# Patient Record
Sex: Male | Born: 1935 | Race: Black or African American | Hispanic: No | Smoking: Former smoker
Health system: Southern US, Community
[De-identification: ages and names within clinical notes are randomized; demographics above are authoritative.]

## PROBLEM LIST (undated history)

## (undated) DIAGNOSIS — Z9289 Personal history of other medical treatment: Secondary | ICD-10-CM

## (undated) DIAGNOSIS — I4891 Unspecified atrial fibrillation: Secondary | ICD-10-CM

## (undated) DIAGNOSIS — I739 Peripheral vascular disease, unspecified: Secondary | ICD-10-CM

## (undated) DIAGNOSIS — D649 Anemia, unspecified: Secondary | ICD-10-CM

## (undated) DIAGNOSIS — J189 Pneumonia, unspecified organism: Secondary | ICD-10-CM

## (undated) DIAGNOSIS — I10 Essential (primary) hypertension: Secondary | ICD-10-CM

## (undated) DIAGNOSIS — K5790 Diverticulosis of intestine, part unspecified, without perforation or abscess without bleeding: Secondary | ICD-10-CM

## (undated) DIAGNOSIS — E079 Disorder of thyroid, unspecified: Secondary | ICD-10-CM

## (undated) DIAGNOSIS — I219 Acute myocardial infarction, unspecified: Secondary | ICD-10-CM

## (undated) DIAGNOSIS — I350 Nonrheumatic aortic (valve) stenosis: Secondary | ICD-10-CM

## (undated) DIAGNOSIS — N189 Chronic kidney disease, unspecified: Secondary | ICD-10-CM

## (undated) DIAGNOSIS — C801 Malignant (primary) neoplasm, unspecified: Secondary | ICD-10-CM

## (undated) DIAGNOSIS — R0602 Shortness of breath: Secondary | ICD-10-CM

## (undated) DIAGNOSIS — I2510 Atherosclerotic heart disease of native coronary artery without angina pectoris: Secondary | ICD-10-CM

## (undated) DIAGNOSIS — K635 Polyp of colon: Secondary | ICD-10-CM

## (undated) DIAGNOSIS — E785 Hyperlipidemia, unspecified: Secondary | ICD-10-CM

## (undated) DIAGNOSIS — E46 Unspecified protein-calorie malnutrition: Secondary | ICD-10-CM

## (undated) DIAGNOSIS — K219 Gastro-esophageal reflux disease without esophagitis: Secondary | ICD-10-CM

## (undated) DIAGNOSIS — K648 Other hemorrhoids: Secondary | ICD-10-CM

## (undated) HISTORY — DX: Anemia, unspecified: D64.9

## (undated) HISTORY — DX: Chronic kidney disease, unspecified: N18.9

## (undated) HISTORY — DX: Disorder of thyroid, unspecified: E07.9

## (undated) HISTORY — DX: Hyperlipidemia, unspecified: E78.5

## (undated) HISTORY — DX: Malignant (primary) neoplasm, unspecified: C80.1

## (undated) HISTORY — PX: TOE AMPUTATION: SHX809

## (undated) HISTORY — DX: Other hemorrhoids: K64.8

## (undated) HISTORY — DX: Peripheral vascular disease, unspecified: I73.9

## (undated) HISTORY — DX: Unspecified protein-calorie malnutrition: E46

## (undated) HISTORY — DX: Essential (primary) hypertension: I10

## (undated) HISTORY — PX: CARDIAC CATHETERIZATION: SHX172

## (undated) HISTORY — PX: AMPUTATION: SHX166

## (undated) HISTORY — DX: Diverticulosis of intestine, part unspecified, without perforation or abscess without bleeding: K57.90

## (undated) HISTORY — DX: Unspecified atrial fibrillation: I48.91

## (undated) HISTORY — DX: Polyp of colon: K63.5

## (undated) HISTORY — PX: INCISE AND DRAIN ABCESS: PRO64

---

## 2004-04-28 ENCOUNTER — Encounter: Admission: RE | Admit: 2004-04-28 | Discharge: 2004-04-28 | Payer: Self-pay | Admitting: Cardiovascular Disease

## 2004-09-16 ENCOUNTER — Ambulatory Visit: Admission: RE | Admit: 2004-09-16 | Discharge: 2004-09-19 | Payer: Self-pay | Admitting: Radiation Oncology

## 2005-01-20 ENCOUNTER — Ambulatory Visit: Admission: RE | Admit: 2005-01-20 | Discharge: 2005-02-25 | Payer: Self-pay | Admitting: Radiation Oncology

## 2005-03-27 ENCOUNTER — Ambulatory Visit (HOSPITAL_COMMUNITY): Admission: RE | Admit: 2005-03-27 | Discharge: 2005-03-27 | Payer: Self-pay | Admitting: Urology

## 2006-05-14 ENCOUNTER — Ambulatory Visit (HOSPITAL_COMMUNITY): Admission: RE | Admit: 2006-05-14 | Discharge: 2006-05-14 | Payer: Self-pay | Admitting: Urology

## 2006-05-21 ENCOUNTER — Encounter (HOSPITAL_COMMUNITY): Admission: RE | Admit: 2006-05-21 | Discharge: 2006-07-06 | Payer: Self-pay | Admitting: Urology

## 2006-06-12 HISTORY — PX: AV FISTULA PLACEMENT: SHX1204

## 2006-07-08 ENCOUNTER — Ambulatory Visit (HOSPITAL_COMMUNITY): Admission: RE | Admit: 2006-07-08 | Discharge: 2006-07-08 | Payer: Self-pay | Admitting: Vascular Surgery

## 2006-11-15 ENCOUNTER — Ambulatory Visit (HOSPITAL_COMMUNITY): Admission: RE | Admit: 2006-11-15 | Discharge: 2006-11-15 | Payer: Self-pay | Admitting: Urology

## 2007-03-09 ENCOUNTER — Encounter (HOSPITAL_COMMUNITY): Admission: RE | Admit: 2007-03-09 | Discharge: 2007-06-07 | Payer: Self-pay | Admitting: Nephrology

## 2007-06-15 ENCOUNTER — Encounter (HOSPITAL_COMMUNITY): Admission: RE | Admit: 2007-06-15 | Discharge: 2007-09-12 | Payer: Self-pay | Admitting: Nephrology

## 2007-11-09 ENCOUNTER — Encounter (HOSPITAL_COMMUNITY): Admission: RE | Admit: 2007-11-09 | Discharge: 2008-02-07 | Payer: Self-pay | Admitting: Nephrology

## 2008-02-08 ENCOUNTER — Encounter (HOSPITAL_COMMUNITY): Admission: RE | Admit: 2008-02-08 | Discharge: 2008-05-08 | Payer: Self-pay | Admitting: Nephrology

## 2008-04-25 ENCOUNTER — Encounter: Admission: RE | Admit: 2008-04-25 | Discharge: 2008-04-25 | Payer: Self-pay | Admitting: Cardiovascular Disease

## 2008-05-31 ENCOUNTER — Encounter (HOSPITAL_COMMUNITY): Admission: RE | Admit: 2008-05-31 | Discharge: 2008-08-29 | Payer: Self-pay | Admitting: Nephrology

## 2008-08-30 ENCOUNTER — Encounter (HOSPITAL_COMMUNITY): Admission: RE | Admit: 2008-08-30 | Discharge: 2008-10-11 | Payer: Self-pay | Admitting: Nephrology

## 2008-10-24 ENCOUNTER — Encounter (HOSPITAL_COMMUNITY): Admission: RE | Admit: 2008-10-24 | Discharge: 2009-01-22 | Payer: Self-pay | Admitting: Nephrology

## 2009-02-06 ENCOUNTER — Encounter (HOSPITAL_COMMUNITY): Admission: RE | Admit: 2009-02-06 | Discharge: 2009-05-07 | Payer: Self-pay | Admitting: Nephrology

## 2009-05-15 ENCOUNTER — Encounter (HOSPITAL_COMMUNITY): Admission: RE | Admit: 2009-05-15 | Discharge: 2009-08-13 | Payer: Self-pay | Admitting: Nephrology

## 2009-07-27 ENCOUNTER — Emergency Department (HOSPITAL_COMMUNITY): Admission: EM | Admit: 2009-07-27 | Discharge: 2009-07-27 | Payer: Self-pay | Admitting: Emergency Medicine

## 2009-08-02 ENCOUNTER — Emergency Department (HOSPITAL_COMMUNITY): Admission: EM | Admit: 2009-08-02 | Discharge: 2009-08-02 | Payer: Self-pay | Admitting: Emergency Medicine

## 2009-10-18 ENCOUNTER — Observation Stay (HOSPITAL_COMMUNITY): Admission: AD | Admit: 2009-10-18 | Discharge: 2009-10-20 | Payer: Self-pay | Admitting: Nephrology

## 2009-10-18 ENCOUNTER — Ambulatory Visit: Payer: Self-pay | Admitting: Internal Medicine

## 2009-10-19 ENCOUNTER — Encounter: Payer: Self-pay | Admitting: Internal Medicine

## 2009-10-19 ENCOUNTER — Encounter (INDEPENDENT_AMBULATORY_CARE_PROVIDER_SITE_OTHER): Payer: Self-pay | Admitting: Nephrology

## 2009-10-25 ENCOUNTER — Encounter (HOSPITAL_COMMUNITY): Admission: RE | Admit: 2009-10-25 | Discharge: 2010-01-20 | Payer: Self-pay | Admitting: Nephrology

## 2009-10-29 ENCOUNTER — Encounter: Payer: Self-pay | Admitting: Internal Medicine

## 2009-11-30 ENCOUNTER — Emergency Department (HOSPITAL_COMMUNITY): Admission: EM | Admit: 2009-11-30 | Discharge: 2009-11-30 | Payer: Self-pay | Admitting: Emergency Medicine

## 2009-12-07 ENCOUNTER — Inpatient Hospital Stay (HOSPITAL_COMMUNITY): Admission: EM | Admit: 2009-12-07 | Discharge: 2009-12-21 | Payer: Self-pay | Admitting: Emergency Medicine

## 2009-12-10 ENCOUNTER — Encounter (INDEPENDENT_AMBULATORY_CARE_PROVIDER_SITE_OTHER): Payer: Self-pay | Admitting: Cardiology

## 2009-12-18 ENCOUNTER — Ambulatory Visit: Payer: Self-pay | Admitting: Physical Medicine & Rehabilitation

## 2009-12-21 ENCOUNTER — Ambulatory Visit: Payer: Self-pay | Admitting: Physical Medicine & Rehabilitation

## 2009-12-21 ENCOUNTER — Inpatient Hospital Stay (HOSPITAL_COMMUNITY)
Admission: EM | Admit: 2009-12-21 | Discharge: 2009-12-28 | Payer: Self-pay | Admitting: Physical Medicine & Rehabilitation

## 2009-12-24 ENCOUNTER — Ambulatory Visit: Payer: Self-pay | Admitting: Internal Medicine

## 2010-01-26 ENCOUNTER — Emergency Department (HOSPITAL_COMMUNITY): Admission: EM | Admit: 2010-01-26 | Discharge: 2010-01-26 | Payer: Self-pay | Admitting: Emergency Medicine

## 2010-02-19 ENCOUNTER — Emergency Department (HOSPITAL_COMMUNITY): Admission: EM | Admit: 2010-02-19 | Discharge: 2010-02-19 | Payer: Self-pay | Admitting: Emergency Medicine

## 2010-03-05 ENCOUNTER — Ambulatory Visit (HOSPITAL_BASED_OUTPATIENT_CLINIC_OR_DEPARTMENT_OTHER): Admission: RE | Admit: 2010-03-05 | Discharge: 2010-03-05 | Payer: Self-pay | Admitting: Orthopedic Surgery

## 2010-03-12 ENCOUNTER — Ambulatory Visit (HOSPITAL_BASED_OUTPATIENT_CLINIC_OR_DEPARTMENT_OTHER): Admission: RE | Admit: 2010-03-12 | Discharge: 2010-03-12 | Payer: Self-pay | Admitting: Orthopedic Surgery

## 2010-05-28 ENCOUNTER — Ambulatory Visit: Payer: Self-pay | Admitting: Vascular Surgery

## 2010-05-30 ENCOUNTER — Ambulatory Visit: Payer: Self-pay | Admitting: Surgery

## 2010-05-30 ENCOUNTER — Ambulatory Visit (HOSPITAL_COMMUNITY): Admission: RE | Admit: 2010-05-30 | Discharge: 2010-05-30 | Payer: Self-pay | Admitting: Vascular Surgery

## 2010-05-30 HISTORY — PX: INSERTION OF DIALYSIS CATHETER: SHX1324

## 2010-06-30 ENCOUNTER — Ambulatory Visit: Payer: Self-pay | Admitting: Surgery

## 2010-08-11 ENCOUNTER — Ambulatory Visit: Payer: Self-pay | Admitting: Surgery

## 2010-08-13 ENCOUNTER — Ambulatory Visit (HOSPITAL_COMMUNITY): Admission: RE | Admit: 2010-08-13 | Discharge: 2010-08-13 | Payer: Self-pay | Admitting: Surgery

## 2010-08-13 ENCOUNTER — Ambulatory Visit: Payer: Self-pay | Admitting: Surgery

## 2010-09-01 ENCOUNTER — Ambulatory Visit: Payer: Self-pay | Admitting: Surgery

## 2010-09-30 ENCOUNTER — Ambulatory Visit (HOSPITAL_COMMUNITY)
Admission: RE | Admit: 2010-09-30 | Discharge: 2010-09-30 | Payer: Self-pay | Source: Home / Self Care | Attending: Nephrology | Admitting: Nephrology

## 2010-11-02 ENCOUNTER — Encounter: Payer: Self-pay | Admitting: Urology

## 2010-11-11 NOTE — Letter (Signed)
Summary: Patient Notice-Hyperplastic Polyps  [facility]  [address] [place], [place] [address]   Phone: [phone]  Fax: [phone]        October 29, 2009 MRN: [id number]    Brad Mcintosh [address] [place], [place]  [address]    Dear [name],  I am pleased to inform you that the polyps removed during your recent colonoscopyand upper endoscopy were found to be hyperplastic (colon) and inflammatory (stomach).  These types of polyps are NOT pre-cancerous.  No further follow-up is needed for these polyps.  Should you develop new or worsening symptoms of abdominal pain, bowel habit changes or bleeding from the rectum or bowels, please schedule an evaluation with either your primary care physician or with me.  No further action with gastroenterology is needed at this time.      Please follow-up with your primary care physician for your other      healthcare needs.  Please call us if you are having persistent problems or have questions about your condition that have not been fully answered at this time.   Sincerely,  Iva Boop MD, [facility] This letter has been electronically signed by your physician.  cc: Annie Sable, MD  Appended Document: Patient Notice-Hyperplastic Polyps letter mailed to patient's home

## 2010-11-11 NOTE — Procedures (Signed)
Summary: EGD   EGD  Procedure date:  10/19/2009  Findings:      Location: [facility]   ENDOSCOPY PROCEDURE REPORT  PATIENT:  Brad Mcintosh, Brad Mcintosh  MR#:  [id number] BIRTHDATE:   17-Jan-1935, 74 yrs. old   GENDER:   male  ENDOSCOPIST:   Iva Boop, MD, [facility] Referred by: Annie Sable, M.D.  PROCEDURE DATE:  10/19/2009 PROCEDURE:  EGD with snare polypectomy, EGD with Submucosal Injection ASA CLASS:   Class III INDICATIONS: FOBT + stool, anemia   MEDICATIONS:    There was residual sedation effect present from prior procedure., Versed 1 mg TOPICAL ANESTHETIC:   Cetacaine Spray  DESCRIPTION OF PROCEDURE:   After the risks benefits and alternatives of the procedure were thoroughly explained, informed consent was obtained.  The EG-2990i ([id number]) endoscope was introduced through the mouth and advanced to the second portion of the duodenum, without limitations.  The instrument was slowly withdrawn as the mucosa was fully examined. <<PROCEDUREIMAGES>>              <<OLD IMAGES>>  A sessile polyp was found in the body of the stomach. It was erosive and bleeding. It was 8 - 10 mm in size. epinephrine injection Polyp was snared, then cauterized with monopolar cautery. Polyp was retrieved and sent to pathology. Endoscopic clip was placed 3 cc EPI injected prio to polypectomy. Prophylactic clip after polypectomy.  Nodular mucosa was found in the body of the stomach. Eroded nodules scattered about. Xanthomas also.  Otherwise the examination was normal.    Retroflexed views revealed no abnormalities.    The scope was then withdrawn from the patient and the procedure completed.  COMPLICATIONS:   None  ENDOSCOPIC IMPRESSION:  1) 8 - 10 mm sessile polyp in the body of the stomach, with heme and erosion present (snared off)  2) Nodular mucosa in the body of the stomach, with erosions  3) Otherwise normal examination RECOMMENDATIONS:  bid PPI in hopsital then qd at dc  I will  follow-up pathology and notify  please call us back if needed  suspect anemia is multifactorial and oozing from polyp/gastrits has contributed  REPEAT EXAM:   await pathology to determine    Iva Boop, MD, Brad Mcintosh    CC: Annie Sable, MD

## 2010-11-11 NOTE — Procedures (Signed)
Summary: Colonoscopy   Colonoscopy  Procedure date:  10/19/2009  Findings:      Location:  [facility].   COLONOSCOPY PROCEDURE REPORT  PATIENT:  Brad, Mcintosh  MR#:  [id number] BIRTHDATE:   April 23, 1935, 74 yrs. old   GENDER:   male  ENDOSCOPIST:   Iva Boop, MD, [facility] Referred by: Annie Sable, M.D.  PROCEDURE DATE:  10/19/2009 PROCEDURE:  Colonoscopy with snare polypectomy ASA CLASS:   Class III INDICATIONS: FOBT positive stool, anemia   MEDICATIONS:    Fentanyl 50 mcg IV, Versed 7 mg IV  DESCRIPTION OF PROCEDURE:   After the risks benefits and alternatives of the procedure were thoroughly explained, informed consent was obtained.  Digital rectal exam was performed and revealed no abnormalities and normal prostate.   The EC-3890Li ([id number]) and EG-2990i ([id number]) endoscope was introduced through the anus and advanced to the cecum, which was identified by both the appendix and ileocecal valve, without limitations.  The quality of the prep was good, using Colyte.  The instrument was then slowly withdrawn as the colon was fully examined over 11 mins 35 seconds. <<PROCEDUREIMAGES>>            <<OLD IMAGES>>  FINDINGS:  Two polyps were found. Two diminutive polyps. Siza about 3 mm. Polyps were snared without cautery. Retrieval was successful.   Moderate diverticulosis was found in the sigmoid colon.  This was otherwise a normal examination of the colon.   Retroflexed views in the rectum revealed internal hemorrhoids.    The scope was then withdrawn from the patient and the procedure completed.  COMPLICATIONS:   None  ENDOSCOPIC IMPRESSION:  1) Two 3mm  polyps removed  2) Moderate diverticulosis in the sigmoid colon  3) Internal hemorrhoids  4) Otherwise normal examination, good prep RECOMMENDATIONS:  EGD next  REPEAT EXAM:   In for Colonoscopy, pending biopsy results.    Iva Boop, MD, [facility]  CC: Annie Sable, MD

## 2010-12-23 LAB — POCT I-STAT, CHEM 8
BUN: 46 mg/dL — ABNORMAL HIGH (ref 6–23)
Calcium, Ion: 1.08 mmol/L — ABNORMAL LOW (ref 1.12–1.32)
Chloride: 105 mEq/L (ref 96–112)
Creatinine, Ser: 8.2 mg/dL — ABNORMAL HIGH (ref 0.4–1.5)
Hemoglobin: 11.6 g/dL — ABNORMAL LOW (ref 13.0–17.0)
Potassium: 4.8 mEq/L (ref 3.5–5.1)
TCO2: 27 mmol/L (ref 0–100)

## 2010-12-26 LAB — POCT I-STAT 4, (NA,K, GLUC, HGB,HCT)
HCT: 39 % (ref 39.0–52.0)
Sodium: 140 mEq/L (ref 135–145)

## 2010-12-28 LAB — CROSSMATCH: Antibody Screen: NEGATIVE

## 2010-12-28 LAB — CBC
HCT: 16.9 % — ABNORMAL LOW (ref 39.0–52.0)
HCT: 24.4 % — ABNORMAL LOW (ref 39.0–52.0)
Hemoglobin: 5.7 g/dL — CL (ref 13.0–17.0)
Hemoglobin: 7.3 g/dL — ABNORMAL LOW (ref 13.0–17.0)
Hemoglobin: 8.3 g/dL — ABNORMAL LOW (ref 13.0–17.0)
MCHC: 33.8 g/dL (ref 30.0–36.0)
MCV: 87.6 fL (ref 78.0–100.0)
RBC: 2.47 MIL/uL — ABNORMAL LOW (ref 4.22–5.81)
RBC: 2.77 MIL/uL — ABNORMAL LOW (ref 4.22–5.81)
RDW: 16.5 % — ABNORMAL HIGH (ref 11.5–15.5)
WBC: 4.7 10*3/uL (ref 4.0–10.5)

## 2010-12-28 LAB — COMPREHENSIVE METABOLIC PANEL
Alkaline Phosphatase: 24 U/L — ABNORMAL LOW (ref 39–117)
BUN: 89 mg/dL — ABNORMAL HIGH (ref 6–23)
Chloride: 115 mEq/L — ABNORMAL HIGH (ref 96–112)
Glucose, Bld: 115 mg/dL — ABNORMAL HIGH (ref 70–99)
Potassium: 5.3 mEq/L — ABNORMAL HIGH (ref 3.5–5.1)
Total Bilirubin: 0.7 mg/dL (ref 0.3–1.2)

## 2010-12-28 LAB — RENAL FUNCTION PANEL
BUN: 78 mg/dL — ABNORMAL HIGH (ref 6–23)
BUN: 88 mg/dL — ABNORMAL HIGH (ref 6–23)
CO2: 12 mEq/L — ABNORMAL LOW (ref 19–32)
CO2: 14 mEq/L — ABNORMAL LOW (ref 19–32)
CO2: 15 mEq/L — ABNORMAL LOW (ref 19–32)
Calcium: 8.1 mg/dL — ABNORMAL LOW (ref 8.4–10.5)
Calcium: 8.5 mg/dL (ref 8.4–10.5)
Chloride: 113 mEq/L — ABNORMAL HIGH (ref 96–112)
Creatinine, Ser: 9.37 mg/dL — ABNORMAL HIGH (ref 0.4–1.5)
Glucose, Bld: 104 mg/dL — ABNORMAL HIGH (ref 70–99)
Glucose, Bld: 115 mg/dL — ABNORMAL HIGH (ref 70–99)
Glucose, Bld: 141 mg/dL — ABNORMAL HIGH (ref 70–99)
Phosphorus: 5.4 mg/dL — ABNORMAL HIGH (ref 2.3–4.6)
Potassium: 4.1 mEq/L (ref 3.5–5.1)
Sodium: 138 mEq/L (ref 135–145)

## 2010-12-28 LAB — IRON AND TIBC
Iron: 33 ug/dL — ABNORMAL LOW (ref 42–135)
Iron: 55 ug/dL (ref 42–135)
Saturation Ratios: 19 % — ABNORMAL LOW (ref 20–55)
TIBC: 178 ug/dL — ABNORMAL LOW (ref 215–435)
TIBC: 181 ug/dL — ABNORMAL LOW (ref 215–435)
UIBC: 145 ug/dL

## 2010-12-28 LAB — ABO/RH: ABO/RH(D): O POS

## 2010-12-28 LAB — FERRITIN: Ferritin: 976 ng/mL — ABNORMAL HIGH (ref 22–322)

## 2010-12-28 LAB — PREPARE RBC (CROSSMATCH)

## 2010-12-29 LAB — WOUND CULTURE

## 2010-12-29 LAB — FUNGUS CULTURE W SMEAR: Fungal Smear: NONE SEEN

## 2010-12-29 LAB — BASIC METABOLIC PANEL
BUN: 94 mg/dL — ABNORMAL HIGH (ref 6–23)
CO2: 24 mEq/L (ref 19–32)
Calcium: 9.5 mg/dL (ref 8.4–10.5)
Chloride: 96 mEq/L (ref 96–112)
Creatinine, Ser: 11.95 mg/dL — ABNORMAL HIGH (ref 0.4–1.5)
GFR calc Af Amer: 5 mL/min — ABNORMAL LOW (ref >60)
GFR calc non Af Amer: 4 mL/min — ABNORMAL LOW (ref >60)
Glucose, Bld: 98 mg/dL (ref 70–99)
Potassium: 4.2 mEq/L (ref 3.5–5.1)
Sodium: 138 mEq/L (ref 135–145)

## 2010-12-29 LAB — ANAEROBIC CULTURE

## 2010-12-29 LAB — POCT I-STAT, CHEM 8
BUN: 35 mg/dL — ABNORMAL HIGH (ref 6–23)
Calcium, Ion: 1.09 mmol/L — ABNORMAL LOW (ref 1.12–1.32)
Chloride: 100 mEq/L (ref 96–112)
Creatinine, Ser: 8.8 mg/dL — ABNORMAL HIGH (ref 0.4–1.5)
Glucose, Bld: 87 mg/dL (ref 70–99)
HCT: 33 % — ABNORMAL LOW (ref 39.0–52.0)
Hemoglobin: 11.2 g/dL — ABNORMAL LOW (ref 13.0–17.0)
Potassium: 4.2 mEq/L (ref 3.5–5.1)
Sodium: 135 mEq/L (ref 135–145)
TCO2: 28 mmol/L (ref 0–100)

## 2010-12-29 LAB — POCT HEMOGLOBIN-HEMACUE: Hemoglobin: 10.7 g/dL — ABNORMAL LOW (ref 13.0–17.0)

## 2010-12-29 LAB — AFB CULTURE WITH SMEAR (NOT AT ARMC): Acid Fast Smear: NONE SEEN

## 2010-12-30 LAB — WOUND CULTURE

## 2010-12-30 LAB — DIFFERENTIAL
Eosinophils Absolute: 0 10*3/uL (ref 0.0–0.7)
Lymphs Abs: 1.3 10*3/uL (ref 0.7–4.0)
Monocytes Absolute: 1.1 10*3/uL — ABNORMAL HIGH (ref 0.1–1.0)
Monocytes Relative: 11 % (ref 3–12)
Neutro Abs: 7.1 10*3/uL (ref 1.7–7.7)
Neutrophils Relative %: 74 % (ref 43–77)

## 2010-12-30 LAB — BASIC METABOLIC PANEL
CO2: 26 mEq/L (ref 19–32)
Chloride: 99 mEq/L (ref 96–112)
Creatinine, Ser: 7.33 mg/dL — ABNORMAL HIGH (ref 0.4–1.5)
GFR calc Af Amer: 9 mL/min — ABNORMAL LOW (ref >60)
Sodium: 137 mEq/L (ref 135–145)

## 2010-12-30 LAB — CBC
Hemoglobin: 11.1 g/dL — ABNORMAL LOW (ref 13.0–17.0)
MCV: 90.2 fL (ref 78.0–100.0)
RBC: 3.67 MIL/uL — ABNORMAL LOW (ref 4.22–5.81)
WBC: 9.6 10*3/uL (ref 4.0–10.5)

## 2010-12-31 LAB — RENAL FUNCTION PANEL
BUN: 142 mg/dL — ABNORMAL HIGH (ref 6–23)
BUN: 145 mg/dL — ABNORMAL HIGH (ref 6–23)
CO2: 16 mEq/L — ABNORMAL LOW (ref 19–32)
CO2: 18 mEq/L — ABNORMAL LOW (ref 19–32)
Chloride: 111 mEq/L (ref 96–112)
Creatinine, Ser: 16.1 mg/dL — ABNORMAL HIGH (ref 0.4–1.5)
Glucose, Bld: 107 mg/dL — ABNORMAL HIGH (ref 70–99)
Potassium: 3.4 mEq/L — ABNORMAL LOW (ref 3.5–5.1)
Sodium: 145 mEq/L (ref 135–145)

## 2010-12-31 LAB — URINALYSIS, ROUTINE W REFLEX MICROSCOPIC
Glucose, UA: NEGATIVE mg/dL
Ketones, ur: NEGATIVE mg/dL
Protein, ur: 100 mg/dL — AB
Urobilinogen, UA: 0.2 mg/dL (ref 0.0–1.0)

## 2010-12-31 LAB — BLOOD GAS, ARTERIAL
Drawn by: 32463
O2 Content: 3 L/min
pCO2 arterial: 22.7 mmHg — ABNORMAL LOW (ref 35.0–45.0)
pO2, Arterial: 131 mmHg — ABNORMAL HIGH (ref 80.0–100.0)

## 2010-12-31 LAB — CBC
HCT: 25.2 % — ABNORMAL LOW (ref 39.0–52.0)
HCT: 27 % — ABNORMAL LOW (ref 39.0–52.0)
HCT: 31.3 % — ABNORMAL LOW (ref 39.0–52.0)
Hemoglobin: 10.4 g/dL — ABNORMAL LOW (ref 13.0–17.0)
Hemoglobin: 8.5 g/dL — ABNORMAL LOW (ref 13.0–17.0)
Hemoglobin: 9.2 g/dL — ABNORMAL LOW (ref 13.0–17.0)
MCHC: 33.1 g/dL (ref 30.0–36.0)
MCHC: 33.9 g/dL (ref 30.0–36.0)
MCV: 89.6 fL (ref 78.0–100.0)
Platelets: 303 10*3/uL (ref 150–400)
RBC: 2.85 MIL/uL — ABNORMAL LOW (ref 4.22–5.81)
RBC: 3.05 MIL/uL — ABNORMAL LOW (ref 4.22–5.81)
RBC: 3.16 MIL/uL — ABNORMAL LOW (ref 4.22–5.81)
RBC: 3.49 MIL/uL — ABNORMAL LOW (ref 4.22–5.81)
RDW: 16.1 % — ABNORMAL HIGH (ref 11.5–15.5)
WBC: 19.3 10*3/uL — ABNORMAL HIGH (ref 4.0–10.5)
WBC: 21.4 10*3/uL — ABNORMAL HIGH (ref 4.0–10.5)

## 2010-12-31 LAB — POCT I-STAT 3, ART BLOOD GAS (G3+)
Acid-base deficit: 21 mmol/L — ABNORMAL HIGH (ref 0.0–2.0)
Bicarbonate: 5.5 mEq/L — ABNORMAL LOW (ref 20.0–24.0)
pO2, Arterial: 49 mmHg — ABNORMAL LOW (ref 80.0–100.0)

## 2010-12-31 LAB — URINE MICROSCOPIC-ADD ON

## 2010-12-31 LAB — DIFFERENTIAL
Basophils Absolute: 0.2 10*3/uL — ABNORMAL HIGH (ref 0.0–0.1)
Eosinophils Absolute: 0 10*3/uL (ref 0.0–0.7)
Lymphs Abs: 0.6 10*3/uL — ABNORMAL LOW (ref 0.7–4.0)
Monocytes Absolute: 0.8 10*3/uL (ref 0.1–1.0)
Neutro Abs: 17.7 10*3/uL — ABNORMAL HIGH (ref 1.7–7.7)

## 2010-12-31 LAB — COMPREHENSIVE METABOLIC PANEL
ALT: 10 U/L (ref 0–53)
ALT: 11 U/L (ref 0–53)
ALT: 9 U/L (ref 0–53)
AST: 14 U/L (ref 0–37)
AST: 28 U/L (ref 0–37)
Albumin: 3 g/dL — ABNORMAL LOW (ref 3.5–5.2)
Albumin: 3.5 g/dL (ref 3.5–5.2)
Alkaline Phosphatase: 47 U/L (ref 39–117)
Alkaline Phosphatase: 55 U/L (ref 39–117)
Alkaline Phosphatase: 56 U/L (ref 39–117)
CO2: 19 mEq/L (ref 19–32)
Calcium: 8.1 mg/dL — ABNORMAL LOW (ref 8.4–10.5)
Chloride: 100 mEq/L (ref 96–112)
Chloride: 102 mEq/L (ref 96–112)
Chloride: 113 mEq/L — ABNORMAL HIGH (ref 96–112)
GFR calc Af Amer: 2 mL/min — ABNORMAL LOW (ref >60)
GFR calc Af Amer: 5 mL/min — ABNORMAL LOW (ref >60)
GFR calc non Af Amer: 4 mL/min — ABNORMAL LOW (ref >60)
Glucose, Bld: 106 mg/dL — ABNORMAL HIGH (ref 70–99)
Glucose, Bld: 99 mg/dL (ref 70–99)
Potassium: 3.2 mEq/L — ABNORMAL LOW (ref 3.5–5.1)
Potassium: 3.5 mEq/L (ref 3.5–5.1)
Potassium: 5.8 mEq/L — ABNORMAL HIGH (ref 3.5–5.1)
Sodium: 141 mEq/L (ref 135–145)
Sodium: 142 mEq/L (ref 135–145)
Total Bilirubin: 0.9 mg/dL (ref 0.3–1.2)
Total Protein: 7.2 g/dL (ref 6.0–8.3)

## 2010-12-31 LAB — CK TOTAL AND CKMB (NOT AT ARMC)
CK, MB: 4.9 ng/mL — ABNORMAL HIGH (ref 0.3–4.0)
Relative Index: 1.2 (ref 0.0–2.5)
Relative Index: 1.6 (ref 0.0–2.5)
Total CK: 193 U/L (ref 7–232)
Total CK: 207 U/L (ref 7–232)

## 2010-12-31 LAB — CULTURE, BLOOD (ROUTINE X 2): Culture: NO GROWTH

## 2010-12-31 LAB — HEPATITIS B SURFACE ANTIBODY,QUALITATIVE: Hep B S Ab: NEGATIVE

## 2010-12-31 LAB — LIPID PANEL
HDL: 32 mg/dL — ABNORMAL LOW (ref >39)
Total CHOL/HDL Ratio: 3.5 RATIO
VLDL: 16 mg/dL (ref 0–40)

## 2010-12-31 LAB — POCT CARDIAC MARKERS
CKMB, poc: 10.9 ng/mL (ref 1.0–8.0)
Myoglobin, poc: >500 ng/mL (ref 12–200)

## 2010-12-31 LAB — HEPATITIS B SURFACE ANTIGEN: Hepatitis B Surface Ag: NEGATIVE

## 2010-12-31 LAB — IRON AND TIBC

## 2010-12-31 LAB — TYPE AND SCREEN: Antibody Screen: NEGATIVE

## 2010-12-31 LAB — LACTIC ACID, PLASMA: Lactic Acid, Venous: 1.5 mmol/L (ref 0.5–2.2)

## 2011-01-04 LAB — BLOOD GAS, ARTERIAL
Acid-Base Excess: 6.9 mmol/L — ABNORMAL HIGH (ref 0.0–2.0)
Bicarbonate: 29.3 mEq/L — ABNORMAL HIGH (ref 20.0–24.0)
O2 Saturation: 99.1 %
Patient temperature: 98.6
TCO2: 30.2 mmol/L (ref 0–100)
pO2, Arterial: 115 mmHg — ABNORMAL HIGH (ref 80.0–100.0)

## 2011-01-04 LAB — CROSSMATCH
ABO/RH(D): O POS
Antibody Screen: NEGATIVE

## 2011-01-04 LAB — RENAL FUNCTION PANEL
Albumin: 2.5 g/dL — ABNORMAL LOW (ref 3.5–5.2)
BUN: 53 mg/dL — ABNORMAL HIGH (ref 6–23)
BUN: 54 mg/dL — ABNORMAL HIGH (ref 6–23)
BUN: 77 mg/dL — ABNORMAL HIGH (ref 6–23)
BUN: 84 mg/dL — ABNORMAL HIGH (ref 6–23)
BUN: 87 mg/dL — ABNORMAL HIGH (ref 6–23)
CO2: 17 mEq/L — ABNORMAL LOW (ref 19–32)
CO2: 19 mEq/L (ref 19–32)
CO2: 20 mEq/L (ref 19–32)
CO2: 24 mEq/L (ref 19–32)
CO2: 24 mEq/L (ref 19–32)
CO2: 25 mEq/L (ref 19–32)
Calcium: 8.3 mg/dL — ABNORMAL LOW (ref 8.4–10.5)
Calcium: 8.5 mg/dL (ref 8.4–10.5)
Calcium: 8.5 mg/dL (ref 8.4–10.5)
Calcium: 9.1 mg/dL (ref 8.4–10.5)
Chloride: 100 mEq/L (ref 96–112)
Chloride: 97 mEq/L (ref 96–112)
Chloride: 97 mEq/L (ref 96–112)
Chloride: 98 mEq/L (ref 96–112)
Creatinine, Ser: 10.83 mg/dL — ABNORMAL HIGH (ref 0.4–1.5)
Creatinine, Ser: 10.99 mg/dL — ABNORMAL HIGH (ref 0.4–1.5)
Creatinine, Ser: 12.02 mg/dL — ABNORMAL HIGH (ref 0.4–1.5)
Creatinine, Ser: 7.85 mg/dL — ABNORMAL HIGH (ref 0.4–1.5)
Creatinine, Ser: 8.51 mg/dL — ABNORMAL HIGH (ref 0.4–1.5)
GFR calc Af Amer: 6 mL/min — ABNORMAL LOW (ref >60)
GFR calc non Af Amer: 4 mL/min — ABNORMAL LOW (ref >60)
Glucose, Bld: 103 mg/dL — ABNORMAL HIGH (ref 70–99)
Glucose, Bld: 104 mg/dL — ABNORMAL HIGH (ref 70–99)
Glucose, Bld: 153 mg/dL — ABNORMAL HIGH (ref 70–99)
Glucose, Bld: 160 mg/dL — ABNORMAL HIGH (ref 70–99)
Glucose, Bld: 167 mg/dL — ABNORMAL HIGH (ref 70–99)
Glucose, Bld: 99 mg/dL (ref 70–99)
Phosphorus: 3.4 mg/dL (ref 2.3–4.6)
Phosphorus: 3.9 mg/dL (ref 2.3–4.6)
Phosphorus: 4.9 mg/dL — ABNORMAL HIGH (ref 2.3–4.6)
Phosphorus: 5.1 mg/dL — ABNORMAL HIGH (ref 2.3–4.6)
Potassium: 3.6 mEq/L (ref 3.5–5.1)
Potassium: 3.6 mEq/L (ref 3.5–5.1)
Potassium: 3.6 mEq/L (ref 3.5–5.1)
Sodium: 132 mEq/L — ABNORMAL LOW (ref 135–145)
Sodium: 136 mEq/L (ref 135–145)

## 2011-01-04 LAB — CBC
HCT: 20.6 % — ABNORMAL LOW (ref 39.0–52.0)
HCT: 24.6 % — ABNORMAL LOW (ref 39.0–52.0)
HCT: 26.2 % — ABNORMAL LOW (ref 39.0–52.0)
HCT: 29.6 % — ABNORMAL LOW (ref 39.0–52.0)
Hemoglobin: 6.7 g/dL — CL (ref 13.0–17.0)
Hemoglobin: 7.6 g/dL — ABNORMAL LOW (ref 13.0–17.0)
Hemoglobin: 8.3 g/dL — ABNORMAL LOW (ref 13.0–17.0)
Hemoglobin: 8.6 g/dL — ABNORMAL LOW (ref 13.0–17.0)
Hemoglobin: 8.7 g/dL — ABNORMAL LOW (ref 13.0–17.0)
Hemoglobin: 8.7 g/dL — ABNORMAL LOW (ref 13.0–17.0)
Hemoglobin: 9.8 g/dL — ABNORMAL LOW (ref 13.0–17.0)
MCHC: 32.5 g/dL (ref 30.0–36.0)
MCHC: 32.7 g/dL (ref 30.0–36.0)
MCHC: 33 g/dL (ref 30.0–36.0)
MCHC: 33.1 g/dL (ref 30.0–36.0)
MCHC: 33.2 g/dL (ref 30.0–36.0)
MCHC: 33.4 g/dL (ref 30.0–36.0)
MCHC: 33.7 g/dL (ref 30.0–36.0)
MCV: 88.4 fL (ref 78.0–100.0)
MCV: 89 fL (ref 78.0–100.0)
MCV: 89.5 fL (ref 78.0–100.0)
MCV: 89.6 fL (ref 78.0–100.0)
MCV: 89.7 fL (ref 78.0–100.0)
MCV: 89.7 fL (ref 78.0–100.0)
MCV: 89.8 fL (ref 78.0–100.0)
MCV: 90.9 fL (ref 78.0–100.0)
Platelets: 210 10*3/uL (ref 150–400)
Platelets: 278 10*3/uL (ref 150–400)
RBC: 2.28 MIL/uL — ABNORMAL LOW (ref 4.22–5.81)
RBC: 2.36 MIL/uL — ABNORMAL LOW (ref 4.22–5.81)
RBC: 2.5 MIL/uL — ABNORMAL LOW (ref 4.22–5.81)
RBC: 2.53 MIL/uL — ABNORMAL LOW (ref 4.22–5.81)
RBC: 2.78 MIL/uL — ABNORMAL LOW (ref 4.22–5.81)
RBC: 2.88 MIL/uL — ABNORMAL LOW (ref 4.22–5.81)
RBC: 2.91 MIL/uL — ABNORMAL LOW (ref 4.22–5.81)
RBC: 3.27 MIL/uL — ABNORMAL LOW (ref 4.22–5.81)
RDW: 15.4 % (ref 11.5–15.5)
RDW: 15.8 % — ABNORMAL HIGH (ref 11.5–15.5)
RDW: 16 % — ABNORMAL HIGH (ref 11.5–15.5)
RDW: 16.1 % — ABNORMAL HIGH (ref 11.5–15.5)
RDW: 16.1 % — ABNORMAL HIGH (ref 11.5–15.5)
RDW: 16.1 % — ABNORMAL HIGH (ref 11.5–15.5)
RDW: 16.6 % — ABNORMAL HIGH (ref 11.5–15.5)
WBC: 11.5 10*3/uL — ABNORMAL HIGH (ref 4.0–10.5)
WBC: 15.6 10*3/uL — ABNORMAL HIGH (ref 4.0–10.5)

## 2011-01-04 LAB — HEMOCCULT GUIAC POC 1CARD (OFFICE): Fecal Occult Bld: NEGATIVE

## 2011-01-04 LAB — DIFFERENTIAL
Basophils Relative: 0 % (ref 0–1)
Eosinophils Absolute: 0.2 10*3/uL (ref 0.0–0.7)
Eosinophils Absolute: 0.2 10*3/uL (ref 0.0–0.7)
Lymphocytes Relative: 6 % — ABNORMAL LOW (ref 12–46)
Lymphs Abs: 0.9 10*3/uL (ref 0.7–4.0)
Lymphs Abs: 1 10*3/uL (ref 0.7–4.0)
Monocytes Absolute: 1.8 10*3/uL — ABNORMAL HIGH (ref 0.1–1.0)
Monocytes Relative: 8 % (ref 3–12)
Neutro Abs: 13.2 10*3/uL — ABNORMAL HIGH (ref 1.7–7.7)
Neutro Abs: 17.4 10*3/uL — ABNORMAL HIGH (ref 1.7–7.7)
Neutrophils Relative %: 85 % — ABNORMAL HIGH (ref 43–77)

## 2011-01-04 LAB — FERRITIN: Ferritin: 2806 ng/mL — ABNORMAL HIGH (ref 22–322)

## 2011-01-04 LAB — IRON AND TIBC
Iron: 15 ug/dL — ABNORMAL LOW (ref 42–135)
Saturation Ratios: 14 % — ABNORMAL LOW (ref 20–55)
TIBC: 110 ug/dL — ABNORMAL LOW (ref 215–435)
UIBC: 95 ug/dL

## 2011-01-15 LAB — BASIC METABOLIC PANEL
Calcium: 8.6 mg/dL (ref 8.4–10.5)
Creatinine, Ser: 9.47 mg/dL — ABNORMAL HIGH (ref 0.4–1.5)
GFR calc Af Amer: 7 mL/min — ABNORMAL LOW (ref >60)
GFR calc non Af Amer: 5 mL/min — ABNORMAL LOW (ref >60)

## 2011-01-15 LAB — CBC
MCHC: 32.3 g/dL (ref 30.0–36.0)
RBC: 4.34 MIL/uL (ref 4.22–5.81)
WBC: 8.4 10*3/uL (ref 4.0–10.5)

## 2011-01-15 LAB — WOUND CULTURE

## 2011-01-15 LAB — DIFFERENTIAL
Basophils Relative: 0 % (ref 0–1)
Lymphs Abs: 0.7 10*3/uL (ref 0.7–4.0)
Monocytes Relative: 11 % (ref 3–12)
Neutro Abs: 6.8 10*3/uL (ref 1.7–7.7)
Neutrophils Relative %: 80 % — ABNORMAL HIGH (ref 43–77)

## 2011-01-15 LAB — SEDIMENTATION RATE: Sed Rate: 52 mm/hr — ABNORMAL HIGH (ref 0–16)

## 2011-01-15 LAB — IRON AND TIBC: Saturation Ratios: 28 % (ref 20–55)

## 2011-01-16 LAB — POCT HEMOGLOBIN-HEMACUE
Hemoglobin: 11.6 g/dL — ABNORMAL LOW (ref 13.0–17.0)
Hemoglobin: 10.8 g/dL — ABNORMAL LOW (ref 13.0–17.0)

## 2011-01-16 LAB — IRON AND TIBC
Iron: 48 ug/dL (ref 42–135)
Saturation Ratios: 26 % (ref 20–55)
TIBC: 184 ug/dL — ABNORMAL LOW (ref 215–435)

## 2011-01-17 LAB — RENAL FUNCTION PANEL
Albumin: 3.3 g/dL — ABNORMAL LOW (ref 3.5–5.2)
CO2: 16 mEq/L — ABNORMAL LOW (ref 19–32)
Chloride: 117 mEq/L — ABNORMAL HIGH (ref 96–112)
GFR calc Af Amer: 8 mL/min — ABNORMAL LOW (ref >60)
GFR calc non Af Amer: 7 mL/min — ABNORMAL LOW (ref >60)
Potassium: 5.7 mEq/L — ABNORMAL HIGH (ref 3.5–5.1)
Sodium: 139 mEq/L (ref 135–145)

## 2011-01-17 LAB — IRON AND TIBC
Iron: 74 ug/dL (ref 42–135)
Saturation Ratios: 44 % (ref 20–55)
TIBC: 170 ug/dL — ABNORMAL LOW (ref 215–435)

## 2011-01-17 LAB — POCT HEMOGLOBIN-HEMACUE: Hemoglobin: 10.7 g/dL — ABNORMAL LOW (ref 13.0–17.0)

## 2011-01-17 LAB — PTH, INTACT AND CALCIUM: Calcium, Total (PTH): 8.5 mg/dL (ref 8.4–10.5)

## 2011-01-18 LAB — IRON AND TIBC: UIBC: 90 ug/dL

## 2011-01-18 LAB — POCT HEMOGLOBIN-HEMACUE: Hemoglobin: 10.3 g/dL — ABNORMAL LOW (ref 13.0–17.0)

## 2011-01-18 LAB — FERRITIN: Ferritin: 974 ng/mL — ABNORMAL HIGH (ref 22–322)

## 2011-01-19 LAB — IRON AND TIBC
Iron: 47 ug/dL (ref 42–135)
TIBC: 171 ug/dL — ABNORMAL LOW (ref 215–435)

## 2011-01-19 LAB — FERRITIN: Ferritin: 713 ng/mL — ABNORMAL HIGH (ref 22–322)

## 2011-01-20 LAB — RENAL FUNCTION PANEL
Albumin: 3.2 g/dL — ABNORMAL LOW (ref 3.5–5.2)
CO2: 14 mEq/L — ABNORMAL LOW (ref 19–32)
Calcium: 8.8 mg/dL (ref 8.4–10.5)
Creatinine, Ser: 7.8 mg/dL — ABNORMAL HIGH (ref 0.4–1.5)
GFR calc Af Amer: 8 mL/min — ABNORMAL LOW (ref >60)
GFR calc non Af Amer: 7 mL/min — ABNORMAL LOW (ref >60)
Phosphorus: 6.7 mg/dL — ABNORMAL HIGH (ref 2.3–4.6)
Sodium: 140 mEq/L (ref 135–145)

## 2011-01-20 LAB — PTH, INTACT AND CALCIUM
Calcium, Total (PTH): 8.1 mg/dL — ABNORMAL LOW (ref 8.4–10.5)
PTH: 462.6 pg/mL — ABNORMAL HIGH (ref 14.0–72.0)

## 2011-01-20 LAB — IRON AND TIBC
Saturation Ratios: 38 % (ref 20–55)
TIBC: 184 ug/dL — ABNORMAL LOW (ref 215–435)
UIBC: 115 ug/dL

## 2011-01-20 LAB — POCT HEMOGLOBIN-HEMACUE: Hemoglobin: 9.6 g/dL — ABNORMAL LOW (ref 13.0–17.0)

## 2011-01-21 LAB — POCT HEMOGLOBIN-HEMACUE
Hemoglobin: 10 g/dL — ABNORMAL LOW (ref 13.0–17.0)
Hemoglobin: 9.9 g/dL — ABNORMAL LOW (ref 13.0–17.0)

## 2011-01-21 LAB — FERRITIN: Ferritin: 818 ng/mL — ABNORMAL HIGH (ref 22–322)

## 2011-01-22 LAB — IRON AND TIBC
Iron: 91 ug/dL (ref 42–135)
TIBC: 192 ug/dL — ABNORMAL LOW (ref 215–435)

## 2011-01-22 LAB — FERRITIN: Ferritin: 900 ng/mL — ABNORMAL HIGH (ref 22–322)

## 2011-01-26 LAB — IRON AND TIBC
Iron: 54 ug/dL (ref 42–135)
Saturation Ratios: 28 % (ref 20–55)
TIBC: 190 ug/dL — ABNORMAL LOW (ref 215–435)

## 2011-01-26 LAB — FERRITIN: Ferritin: 830 ng/mL — ABNORMAL HIGH (ref 22–322)

## 2011-01-27 LAB — POCT HEMOGLOBIN-HEMACUE: Hemoglobin: 9.8 g/dL — ABNORMAL LOW (ref 13.0–17.0)

## 2011-01-27 LAB — PTH, INTACT AND CALCIUM: Calcium, Total (PTH): 8.7 mg/dL (ref 8.4–10.5)

## 2011-01-27 LAB — IRON AND TIBC
Iron: 108 ug/dL (ref 42–135)
UIBC: 76 ug/dL

## 2011-01-27 LAB — RENAL FUNCTION PANEL
Albumin: 3.3 g/dL — ABNORMAL LOW (ref 3.5–5.2)
CO2: 14 mEq/L — ABNORMAL LOW (ref 19–32)
Calcium: 8.8 mg/dL (ref 8.4–10.5)
Chloride: 117 mEq/L — ABNORMAL HIGH (ref 96–112)
Creatinine, Ser: 7.05 mg/dL — ABNORMAL HIGH (ref 0.4–1.5)
GFR calc Af Amer: 9 mL/min — ABNORMAL LOW (ref >60)
GFR calc non Af Amer: 8 mL/min — ABNORMAL LOW (ref >60)
Sodium: 140 mEq/L (ref 135–145)

## 2011-01-27 LAB — FERRITIN: Ferritin: 1099 ng/mL — ABNORMAL HIGH (ref 22–322)

## 2011-02-24 NOTE — Consult Note (Signed)
NEW PATIENT CONSULTATION   Brad Mcintosh, Brad Mcintosh  DOB:  [birth year]                                       05/28/2010  ZOXWR#:[id number]   I saw the patient in the office today to evaluate a wound on the distal  aspect of his left long finger.  He does not remember any specific  injury to the finger and states that he has had the wound for several  weeks.  The patient has a history of chronic kidney disease and dialyzes  on Tuesdays, Thursdays and Saturdays.  He had a left forearm AV fistula  placed in September of 2007 which he has been using for dialysis since  that time.  Of note, he has had no recent uremic symptoms.  Specifically  he denies any problems with fatigue, anorexia, nausea, vomiting,  palpitations or shortness of breath.   PAST MEDICAL HISTORY:  Significant for chronic kidney disease.  He  dialyzes Tuesdays, Thursdays and Saturdays.  In addition, he has  hypertension, hyperlipidemia, paroxysmal atrial fibrillation, history of  prostate cancer, history of gout, history of iron deficiency anemia.  He  also has secondary hyperparathyroidism and also history of malnutrition.   SOCIAL HISTORY:  He denies tobacco use.   FAMILY HISTORY:  There is no history of premature cardiovascular  disease.   REVIEW OF SYSTEMS:  GENERAL:  He has had no recent weight loss, weight  gain.  He has had no fever or chills.  CARDIOVASCULAR:  He has had no recent chest pain, chest pressure,  palpitations or arrhythmias.  He has had no history of stroke or TIAs.  GI, neurologic, pulmonary, hematologic, GU, ENT, musculoskeletal,  psychiatric review of systems is unremarkable.   PHYSICAL EXAMINATION:  General:  This is a pleasant 76 year old  gentleman who appears his stated age.  Vital signs:  Blood pressure is  177/95, heart rate is 62, respiratory rate is 12.  HEENT:  Unremarkable.  Lungs:  Are clear bilaterally to auscultation.  Cardiovascular:  I do  not detect any carotid  bruits.  He has a regular rate and rhythm.  He  has a palpable brachial and radial pulse on the right.  He has a  functioning forearm fistula on the left.  I cannot palpate a radial  pulse on the left below his fistula.  Abdomen:  Soft and nontender.  He  has normal pitched bowel sounds.  Extremities:  Exam reveals that he has  had an amputation of the distal right long finger.  On the left long  finger he has a dry eschar at the tip of the finger.  Neurological:  He  has no focal weakness or paresthesias.  Skin:  Besides the wound on his  left long finger he has an open amputation wound with some foul drainage  odor on the right great toe.  Vascular:  I cannot palpate pedal pulses.  He does have palpable femoral pulses.   I independently performed a Doppler study in the office today which  showed significant augmentation of his radial signal with the Doppler  with compression of his fistula.  I was unable to obtain a palmar arch  signal with the Doppler.  The left long finger is slightly cool.   I think this patient has a steal which certainly could be contributing  to the wound on his  left long finger.  Given that he has a nonhealing  wound certainly there is risk of this progressing and he could require  more proximal amputation.  I have recommended we ligate his fistula to  optimize the blood flow to the hand and will have to place a catheter  for dialysis for the time being.  I do not think it would be wise to  place new access right now as he has a wound in the right great toe that  may be infected.   The patient has previously had the right long finger distal phalanx  amputated by Dr. Mina Marble and once we have his fistula ligated he will  likely need to follow up with Dr. Mina Marble.  He was not sure who was  following the wound on the right foot today but certainly if necessary  would be happy to assist in evaluating the possible need for  revascularization on the right if  needed.   I will plan on seeing him back after his fistula is ligated for  continued followup.  His surgery has been scheduled for 05/30/2010.  We  will ligate his left forearm fistula and place a dialysis catheter.     Di Kindle. Edilia Bo, M.D.  Electronically Signed   CSD/MEDQ  D:  05/28/2010  T:  05/28/2010  Job:  [id number]   cc:   Wilber Bihari. Caryn Section, M.D.

## 2011-02-24 NOTE — Assessment & Plan Note (Signed)
OFFICE VISIT   FADEL, CLASON  DOB:  1935/08/30                                       06/30/2010  LOVFI#:[id number]   REASON FOR VISIT:  Followup, fistula ligation.   HISTORY:  This is a 76 year old gentleman who underwent ligation of a  left radiocephalic fistula secondary to steal syndrome and a nonhealing  ulcer on his left finger.  He comes back in today for followup.  His  finger has improved somewhat; however, there is still an ulceration  which has eschar over top of it.  He also has an amputated right finger  which has not fully healed.  There are no open areas but there is still  an eschar that is yet to separate.  He also has a wound on his right  foot.  All of his wounds are being managed by Dr. Mina Marble.  His  incision from his fistula ligation site is well-healed.  He has no  evidence of steal syndrome.   The patient currently dialyzes through a Palindrome catheter.  He will  need this converted to a more permanent access.  However, with his wound  issues on both hands as well as his right foot, I do not think that  pushing for access right now is in his best interest.  I am going to  have him come back to see me in 6 weeks to see how he is doing.  He will  get a vein map on his right arm at that time.     [name], MD  Electronically Signed   VWB/MEDQ  D:  06/30/2010  T:  07/01/2010  Job:  [id number]   cc:   Mindi Slicker. Lowell Guitar, M.D.

## 2011-02-24 NOTE — Assessment & Plan Note (Signed)
OFFICE VISIT   Brad Mcintosh, Brad Mcintosh  DOB:  [birth year]                                       09/01/2010  WJXBJ#:[id number]   The patient comes back today for followup after having undergone  arteriogram to help determine his next dialysis access.   In summary, this is a 76 year old gentleman on hemodialysis Tuesday,  Thursday, Saturday.  He had a left forearm AV fistula placed in  September of 2007.  He saw Dr. Edilia Bo in August of 2011 with a left  long finger wound.  It was thought that his nonhealing wound was  secondary to steal from his dialysis fistula.  He underwent ligation of  his fistula.  Because of his history I elected to perform an arteriogram  to determine if he was a candidate for a right arm graft since I could  not palpate a radial pulse on the right.  Angiographic findings revealed  an abnormal origin to the right radial artery with an occluded ulnar  artery.  There was extremely sluggish flow through the radial artery.  I  did not think that proceeding with a fistula was indicated for concerns  of steal in his right arm.  I did evaluate his lower extremities for  possible thigh graft.  He has a known wound on the right great toe.  He  is also complaining of rest pain like symptoms on the left side.  The  angiogram revealed severe tibial disease bilaterally.   PHYSICAL EXAMINATION:  Vital signs:  His blood pressure is 115/69, heart  rate 67, respiratory rate 16.  General:  He is well-appearing, in no  distress.  Respirations are nonlabored.  Cardiovascular:  Pedal pulses  are not palpable.  There is an eschar over a right great toe amputation  site.  There is no evidence of infection. There are no wounds on the  left foot.   PLAN:  With the patient's severe atherosclerotic changes I would be  reluctant to proceed with a fistula or graft at this time.  I think he  is at high risk for steal in the right arm given his history of steal in  the left  arm as well as occluded ulnar artery on the right, extremely  sluggish flow in the radial artery on the right.  With the wound in his  right groin I certainly would not consider a right thigh graft and with  rest pain like symptoms on the left and severe tibial disease I think a  left groin access would certainly be high risk.  Therefore at this time  I would not recommend a fistula or a graft but rather continuing his  dialysis through a catheter.   Myself nor Dr. Edilia Bo had not been involved with following the  patient's peripheral vascular disease.  He has undergone a toe  amputation of the right great toe which appears to be healing but since  he has known severe peripheral vascular disease if there are further  issues with wound healing I would be happy to assist.  However, the  patient has very minimal options for revascularization.     [name], MD  Electronically Signed   VWB/MEDQ  D:  09/01/2010  T:  09/01/2010  Job:  [id number]   cc:   Mindi Slicker. Lowell Guitar, M.D.  Dr Caryn Section

## 2011-02-24 NOTE — Procedures (Signed)
CEPHALIC VEIN MAPPING   INDICATION:  Preop for right AV fistula.   HISTORY:  End-stage renal disease.   EXAM:  The right cephalic vein is not compressible with chronic post thrombotic  scarring noted throughout the right upper extremity cephalic vein.   The right basilic vein is mostly compressible with wall thickening and  mild chronic post thrombotic scarring noted in the distal brachium level  and antecubital fossa levels.   Diameter measurements range from 0.26 to 0.49 cm.   See attached worksheet for all measurements.   IMPRESSION:  The right cephalic and basilic veins were evaluated as  described above.   ___________________________________________  V. Charlena Cross, MD   CH/MEDQ  D:  08/11/2010  T:  08/11/2010  Job:  [id number]

## 2011-02-24 NOTE — Procedures (Signed)
VASCULAR LAB EXAM   INDICATION:  Third left finger is painful/questionable ischemia.   HISTORY:   DIABETES:  No.  CARDIAC:  No.  HYPERTENSION:  Yes.   EXAM:  Steal test of AV fistula.   IMPRESSION:  Before compression of arteriovenous fistula blood pressure  registered at 184.  While compressing arteriovenous fistula, blood  pressure was 190.  This does not indicate that there may be a steal from  arteriovenous fistula.        ___________________________________________  Di Kindle. Edilia Bo, M.D.   CB/MEDQ  D:  05/28/2010  T:  05/28/2010  Job:  [id number]

## 2011-02-24 NOTE — Assessment & Plan Note (Signed)
OFFICE VISIT   SALVADOR, COUPE  DOB:  1935/09/11                                       08/11/2010  ZOXWR#:[id number]   The patient comes back in today for followup.  He has a left  radiocephalic fistula ligated secondary to steal syndrome and a  nonhealing ulcer on his left finger.  His wounds are being managed by  Dr. Mina Marble.  He also has had a right finger amputated secondary to  infection.  He comes back to discuss new access.  He had a vein mapping  today.  His right cephalic vein is thrombosed.  He does have a basilic  vein that it is thickened with diffuse chronic post-traumatic scarring.  I am unable to feel a radial pulse on the right.  He does have a  palpable brachial pulse.  I think before proceeding with a right-sided  access procedure that he needs an arteriogram of his right arm since I  cannot feel a radial pulse.  He is not a candidate for right arm access.  I would also need to evaluate his lower extremities.  He has a right  foot wound which is healing.  There are no wounds on the left foot, so I  would perform a diagnostic arteriogram in the legs as well.  I have  scheduled this for Wednesday, November 2nd.     [name], MD  Electronically Signed   VWB/MEDQ  D:  08/11/2010  T:  08/12/2010  Job:  [id number]   cc:   Wilber Bihari. Caryn Section, M.D.  Mindi Slicker. Lowell Guitar, M.D.

## 2011-02-27 NOTE — Op Note (Signed)
NAME:  Brad Mcintosh, Brad Mcintosh                   ACCOUNT NO.:  [id number]   MEDICAL RECORD NO.:  [id number]          PATIENT TYPE:  AMB   LOCATION:  DAY                          FACILITY:  [facility]   PHYSICIAN:  Lindaann Slough, M.D.  DATE OF BIRTH:  Jul 26, 1935   DATE OF PROCEDURE:  03/26/2005  DATE OF DISCHARGE:                                 OPERATIVE REPORT   PREOPERATIVE DIAGNOSIS:  Adenocarcinoma of prostate, stage T1C.   POSTOPERATIVE DIAGNOSES:  Adenocarcinoma of prostate, stage T1C.   PROCEDURE:  Cystoscopy, insertion of suprapubic catheter and cryoablation of  prostate.   SURGEON:  Lindaann Slough, M.D.   INDICATIONS:  The patient is a 76 year old male who had an elevated PSA. His  PSA was 9.63. The patient had prostate biopsy that was positive for  adenocarcinoma of the prostate, Gleason score 3+4.  The patient's prostate  volume was 104.72 mL. He was treated with Lupron  and repeat prostate  ultrasound showed a volume of 47.3 mL. Treatment options were discussed with  the patient and he elected to have cryosurgery. He is scheduled today for  the procedure. The the stage of his cancer is T1C.   Under general anesthesia, the patient was prepped and draped and placed in  the dorsolithotomy position. A flexible cystoscope was inserted in the  bladder. The patient has trilobar prostatic hypertrophy. The bladder is  moderately trabeculated.  There is no stone or tumor in the bladder. The  ureteral orifices are in normal position and shape with clear efflux. Then  under ultrasound guidance, a number of 14-French microinvasive suprapubic  catheter was inserted in the bladder. The cystoscope was then removed. A #16  Foley catheter was inserted in the bladder. The transrectal ultrasound was  then placed on the stepper device. The bladder neck, the urethra and  prostate were well visualized on both sagittal and transverse planes.   Then two ice rods were placed on the anterior prostate at C3.5  and E3.5  positions. Then a row of ice rods were placed at b2.5 and C2.5 position. A  third row of ice rods were then placed at the C2.0 and E2.0 position. A  fourth row of ice rods were placed at the c2.5 and e2.5 position and a fifth  row was placed at D2.0 position. Then a thermal sensor was placed on the  right neurovascular bundle and another thermal sensor was placed on the left  neurovascular bundle, another thermal sensor was placed in Denonvilliers  fascia at the mid prostate in the midline and another thermal sensor was  placed at the area of the urethral sphincter posterior to the urethra. The  needles were visualized on both sagittal and transverse planes. The Foley  catheter was then removed. The flexible cystoscope was then reinserted in  the bladder. There is no evidence of urethral or bladder penetration with  the needles and then the sphincter could be seen winking from the sphincter  probe, so we felt that all needles were in good position. Then a super stiff  Cook wire was passed through the  cystoscope into the bladder. The cystoscope  was removed and the urethral warmer catheter was passed over the guidewire.  The first freeze cycle was then started on the first row at a flow rate of  100%. A nice ice ball was then visualized and then in subsequent fashion row  2, 3, 4 and 5 were started. The first freeze time for group one was 10  minutes. The first freeze time for the second row was 10 minutes, for the  third group it was five minutes, for group 4 and 5, it was 3.5 minutes.  There was good freezing of the prostate. The temperature at the external  sphincter was 16, at Denonvilliers fascia was also 16, on the right  neurovascular bundle it went down to -30 degrees and -3 at the left  neurovascular bundle. Then an active thaw was performed for 10 minutes and  then the second freeze cycle was started beginning with the first group. The  freeze time was 10 minutes for  first and second group, 6 minutes and 30  seconds for the third group,  4 minutes for group four, and 5 minutes and 30  seconds for group five. Following this, a second thaw time was performed for  10 minutes and the temperature at the right neurovascular bundle was -24, it  was 16 at Denonvilliers fascia, zero at the left neurovascular bundle and 11  at the external sphincter. Then all needles were removed. The urethral  warmer catheter was left in place for 40 minutes, pressure was held on the  perineum for 10 minutes.   The patient tolerated the procedure well and left the OR in satisfactory  condition to post anesthesia care unit.       MN/MEDQ  D:  03/27/2005  T:  03/28/2005  Job:  [id number]   cc:   Ricki Rodriguez, M.D.  [address]  [place] [address]  Fax: [phone]

## 2011-02-27 NOTE — Op Note (Signed)
NAME:  Brad Mcintosh, Brad Mcintosh                   ACCOUNT NO.:  [id number]   MEDICAL RECORD NO.:  [id number]          PATIENT TYPE:  AMB   LOCATION:  SDS                          FACILITY:  [facility]   PHYSICIAN:  Janetta Hora. Fields, MD  DATE OF BIRTH:  09-09-1935   DATE OF PROCEDURE:  07/08/2006  DATE OF DISCHARGE:  07/08/2006                                 OPERATIVE REPORT   PROCEDURE:  Left radiocephalic arteriovenous fistula.   PREOPERATIVE DIAGNOSIS:  Renal failure.   POSTOPERATIVE DIAGNOSIS:  Renal failure.   ANESTHESIA:  Local with IV sedation.   ASSISTANT:  Gershon Crane, PA-C   OPERATIVE FINDINGS:  1. 4-5 mm left cephalic vein.  2. 3 mm radial artery.   OPERATIVE DETAILS:  After obtaining informed consent, the patient was taken  to the operating room.  The patient was placed in a supine position on the  operating table.  After adequate sedation, the patient's entire left upper  extremity was prepped and draped in the usual sterile fashion.  Local  anesthesia was infiltrated at the left wrist just adjacent to the radial  artery.  Incision was carried down through the subcutaneous tissues down to  level of the radial artery.  Radial artery was dissected free  circumferentially and controlled proximally and distally with vessel loops.  Next, the cephalic vein was dissected free in a more medial plane.  The vein  was fairly small in caliber in this location.  It had a very large branch  over on the dorsal aspect of the wrist.  Therefore, local anesthesia was  infiltrated over this.  An additional longitudinal incision was made  approximately 3 cm away from the first incision, carried down through the  subcutaneous tissues down to the level of the cephalic vein.  This dissected  free circumferentially.  Small side branches were ligated and divided  between silk ties.  Vein was then transected distally and oversewn with a 3-  0 silk stitch.  Vein was then tunneled subcutaneously under the skin  bridge.  The patient was given 5000 units of intravenous heparin.  The vessel loops  were pulled taut on the artery.  Longitudinal arteriotomy was made; vein was  sewn end-of-vein to side-of-artery using a running 7-0 Prolene suture.  Just  prior to completion of anastomosis the artery was forward and back-bled and  thoroughly flushed.  Anastomosis was secured; clamps were released.  There  was a palpable thrill in the fistula immediately.  Hemostasis was obtained.  Subcutaneous tissues of both incisions reapproximated using running 3-0  Vicryl suture.  Skin of both incisions was then closed with a 4-0 Vicryl  subcuticular stitch.  The patient tolerated the procedure well, and there  were no complications.  Sponge, needle, and instrument counts were correct  at the end of the case.  The patient taken to the recovery room in stable  condition.     Janetta Hora. Fields, MD  Electronically Signed    CEF/MEDQ  D:  07/08/2006  T:  07/10/2006  Job:  [id number]

## 2011-05-25 ENCOUNTER — Encounter: Payer: Self-pay | Admitting: Physician Assistant

## 2011-05-27 ENCOUNTER — Encounter (INDEPENDENT_AMBULATORY_CARE_PROVIDER_SITE_OTHER): Payer: PRIVATE HEALTH INSURANCE

## 2011-05-27 ENCOUNTER — Encounter: Payer: Self-pay | Admitting: Physician Assistant

## 2011-05-27 ENCOUNTER — Ambulatory Visit (INDEPENDENT_AMBULATORY_CARE_PROVIDER_SITE_OTHER): Payer: PRIVATE HEALTH INSURANCE | Admitting: Physician Assistant

## 2011-05-27 VITALS — BP 145/84 | HR 82

## 2011-05-27 DIAGNOSIS — I739 Peripheral vascular disease, unspecified: Secondary | ICD-10-CM

## 2011-05-27 DIAGNOSIS — I7092 Chronic total occlusion of artery of the extremities: Secondary | ICD-10-CM

## 2011-05-27 DIAGNOSIS — T8189XA Other complications of procedures, not elsewhere classified, initial encounter: Secondary | ICD-10-CM

## 2011-05-27 NOTE — Progress Notes (Signed)
Subjective:     Patient ID: Brad Mcintosh, male   DOB: 10-Jul-1935, 76 y.o.   MRN: [id number]  HPI Pt presents at the request of his HD RN for eval of bilateral non healing toe amp sites.  Pt states he had his left first toe and right first and second toes amputated by Dr. Justin Mend approx 2 months ago for non healing wounds and pain.  He states that he has not been seen by that physician in over a month.  He still has pain at the amputation sites (R > L) but denies claudication and rest pain.  His son has been performing dressing changes daily with dry gauze and bilat ACE wraps.  He denies F/C, CP, SOB, N/V, diarrhea, and constipation.  He states he is currently taking an antibiotic prescribed by Dr. Justin Mend but he does not recall the name and does not have a list of medications with him.    He has been evaled in the past by Dr. Myra Gianotti for HD access which included an arteriogram with bilat UE and LE runoff performed 08/2010.  Pt has hx of steal in the left UE after AVF placement and was found to have vascular disease in the right UE as well as bilateral tibial disease with peroneal runoff.  His fistula was ligated and he has been dialyzing through a right sided cath [facility] since that time without difficulty.  Dr. Myra Gianotti felt he was not a candidate for additional permanent access secondary to his diffuse PAD.  Pt has not been re-evaled since that time.    Past Medical History  Diagnosis Date  . Chronic kidney disease   . Hypertension   . Hyperlipidemia   . Cancer     prostate  . Gout   . Anemia     Iron deficiency  . Thyroid disease   . Malnutrition   . Atrial fibrillation     paroxysmal   Past Surgical History  Procedure Date  . Av fistula placement 06/2006    left forearm  . Amputation     right finger  . Incise and drain abcess     chronic complex infection right long finger  . Insertion of dialysis catheter 05/30/2010    right internal jugular Palindrome catheter    History  Substance Use  Topics  . Smoking status: Never Smoker   . Smokeless tobacco: Not on file  . Alcohol Use:     No family history on file.  No Known Allergies  Current outpatient prescriptions:aspirin 81 MG tablet, Take 81 mg by mouth daily.  , Disp: , Rfl: ;  citalopram (CELEXA) 20 MG tablet, Take 20 mg by mouth at bedtime as needed.  , Disp: , Rfl: ;  colchicine 0.6 MG tablet, Take 0.6 mg by mouth as needed.  , Disp: , Rfl: ;  diltiazem (TIAZAC) 180 MG 24 hr capsule, Take 180 mg by mouth 2 (two) times daily.  , Disp: , Rfl:  HYDROcodone-acetaminophen (NORCO) 5-325 MG per tablet, Take 1 tablet by mouth at bedtime as needed.  , Disp: , Rfl: ;  metoprolol (TOPROL-XL) 100 MG 24 hr tablet, Take 100 mg by mouth daily.  , Disp: , Rfl: ;  pantoprazole (PROTONIX) 40 MG tablet, Take 40 mg by mouth daily.  , Disp: , Rfl: ;  simvastatin (ZOCOR) 10 MG tablet, Take 10 mg by mouth at bedtime.  , Disp: , Rfl:   Filed Vitals:   05/27/11 1423  BP: 145/84  Pulse: 82     ABIs were performed 05/27/11 but pedal vessels are non-compressible and toe pressures could not be obtained due to absence of pulses on PPG and great toe amputations.  There was monophasic waveforms in the bilat PT and DP.      Review of Systems      General: DENIES: Weight loss, Weight gain, Loss of appetite, Fever  Neurologic: DENIES: Dizziness, Blackouts, Headaches, Seizure  Ear/Nose/Throat: DENIES:  Change in eyesight, Change in hearing, Nose bleeds, Sore throat  Vascular: DENIES: Pain in legs with walking, Pain in feet while lying flat, Stroke, "Mini stroke", Slurred speech, Temporary blindness, Blood clot in vein, Phlebitis  Pulmonary: DENIES: Home oxygen, Productive cough, Bronchitis, Coughing up blood, Asthma, Wheezing  Musculoskeletal: DENIES: Arthritis, Joint pain, Muscle pain  Cardiac: DENIES: Chest pain, Chest tightness/pressure, Shortness of breath when lying flat, Shortness of breath with exertion, Palpitations, Heart murmur,  Arrythmia, Atrial fibrillation  Hematologic: DENIES: Bleeding problems, Clotting disorder, Anemia  Psychiatric: DENIES: Depression, Anxiety, Attention deficit disorder  Gastrointestinal:  DENIES: Black stool, Blood in stool, Peptic ulcer disease, Reflux, Hiatal hernia, Trouble swallowing, Diarrhea, Constipation  Urinary: DENIES: Kidney disease, Burning with urination, Frequent urination, Difficulty urinating  Skin:DENIES:  Ulcers, Rashes  Objective:   Physical Exam  Constitutional: He is oriented to person, place, and time. He appears well-developed and well-nourished. No distress.  HENT:  Head: Normocephalic and atraumatic.  Eyes: EOM are normal. Pupils are equal, round, and reactive to light.  Neck: Normal range of motion.  Cardiovascular: Normal rate and regular rhythm.  Exam reveals no friction rub.   Pulmonary/Chest: Effort normal and breath sounds normal. No respiratory distress.  Abdominal: Soft. Bowel sounds are normal.  Musculoskeletal: Normal range of motion.       Palp fem pulses bilat. Pedal pulses not palp bilat.  bilat LE warm. bilat toe amp sites with minimal sero-sanguinous drainage.sutures intact.  approx 2/3 of left amp site healed with small area next to second toe that has minimal exudate present.  Half of right amp site healed with remainder tender to touch, exudate present, and possible punctate deep opening. No erythema or edema.  Neurological: He is alert and oriented to person, place, and time. No cranial nerve deficit.  Skin: Skin is warm and dry. No rash noted. There is erythema.  Psychiatric: He has a normal mood and affect. His behavior is normal.       Assessment:     PAD with non healing bilateral toe amputation sites    Plan:   Schedule for aortogram with bilateral LE runoff and possible intervention by Dr. Edilia Bo 06/01/11.  Continue current LWC.    Clinic MD: Dr. Edilia Bo

## 2011-06-01 ENCOUNTER — Ambulatory Visit (HOSPITAL_COMMUNITY)
Admission: RE | Admit: 2011-06-01 | Discharge: 2011-06-01 | Disposition: A | Discharge status: Home / Self Care | Payer: PRIVATE HEALTH INSURANCE | Source: Ambulatory Visit | Attending: Vascular Surgery | Admitting: Vascular Surgery

## 2011-06-01 DIAGNOSIS — I739 Peripheral vascular disease, unspecified: Secondary | ICD-10-CM

## 2011-06-01 DIAGNOSIS — Z01810 Encounter for preprocedural cardiovascular examination: Secondary | ICD-10-CM | POA: Insufficient documentation

## 2011-06-01 DIAGNOSIS — L98499 Non-pressure chronic ulcer of skin of other sites with unspecified severity: Secondary | ICD-10-CM

## 2011-06-01 DIAGNOSIS — L97509 Non-pressure chronic ulcer of other part of unspecified foot with unspecified severity: Secondary | ICD-10-CM | POA: Insufficient documentation

## 2011-06-01 HISTORY — PX: OTHER SURGICAL HISTORY: SHX169

## 2011-06-01 LAB — POCT I-STAT, CHEM 8
BUN: 29 mg/dL — ABNORMAL HIGH (ref 6–23)
Chloride: 102 mEq/L (ref 96–112)
Creatinine, Ser: 9.6 mg/dL — ABNORMAL HIGH (ref 0.50–1.35)
Glucose, Bld: 90 mg/dL (ref 70–99)
Potassium: 3.3 mEq/L — ABNORMAL LOW (ref 3.5–5.1)

## 2011-06-04 NOTE — Op Note (Signed)
NAME:  Brad Mcintosh, Brad Mcintosh NO.:  [id number]  MEDICAL RECORD NO.:  [id number]  LOCATION:  SDSC                         FACILITY:  [facility]  PHYSICIAN:  Di Kindle. Edilia Bo, M.D.DATE OF BIRTH:  1934-10-31  DATE OF PROCEDURE:  06/01/2011 DATE OF DISCHARGE:                              OPERATIVE REPORT   PREOPERATIVE DIAGNOSIS:  Nonhealing wounds of both feet with peripheral vascular disease.  POSTOPERATIVE DIAGNOSIS:  Nonhealing wounds of both feet with peripheral vascular disease.  PROCEDURE: 1. Ultrasound-guided access to the right common femoral artery. 2. Aortogram with bilateral iliac arteriogram. 3. Selective catheterization of the left external iliac artery with     left lower extremity runoff. 4. Retrograde right femoral arteriogram with right lower extremity     runoff. 5. Perclose of the right common femoral artery.  SURGEON:  Di Kindle. Edilia Bo, MD  ANESTHESIA:  Local with sedation. INDICATIONS:  This is a 76 year old gentleman who was seen in our office on May 27, 2011, with nonhealing wounds of both feet.  He had evidence of peripheral vascular disease and brought in for diagnostic arteriography.  TECHNIQUE:  The patient was taken to the PV lab and sedated with a milligram of Versed and 50 mcg of fentanyl.  Both groins were prepped and draped in usual sterile fashion.  After the skin was infiltrated with 1% lidocaine and under ultrasound guidance the right common femoral artery was cannulated and guidewire introduced into the infrarenal aorta under fluoroscopic control.  A 5-French sheath was introduced over the wire.  Pigtail catheter was positioned at the L1 vertebral body and flush aortogram obtained.  The catheter was then positioned above the aortic bifurcation and oblique iliac projection was obtained.  The pigtail catheter was exchanged for a crossover catheter, which was positioned to the left common iliac artery.  The wire was  advanced into the external iliac artery and then the crossover catheter exchanged for an end-hole catheter.  Selective left external iliac arteriogram was obtained with left lower extremity runoff.  Next, this end-hole catheter was removed and a retrograde right femoral arteriogram was obtained with right lower extremity runoff.  At the completion of the procedure, the right common femoral artery was closed with Perclose.  FINDINGS:  There are single renal arteries bilaterally.  There is no significant disease of the infrarenal aorta, bilateral common iliac arteries, bilateral external iliac arteries, or internal iliac arteries.  On the left side the common femoral, superficial femoral and deep femoral artery are patent as is the popliteal artery.  The anterior tibial and posterior tibial arteries are occluded on the left with single-vessel runoff via the peroneal artery on the left.  On the right side, the common femoral, superficial femoral and deep femoral arteries are patent as is the popliteal artery.  The dominant runoff is the peroneal artery on the right.  The proximal anterior tibial artery is patent but this is occluded in the proximal segment with severe disease and is occluded distally.  The posterior tibial artery is occluded with a reconstituted short segment in the distal third of the leg but no runoff onto the foot.  CONCLUSIONS:  Severe tibial  artery occlusive disease bilaterally with dominant runoff via the peroneal artery bilaterally and no good options for revascularization.     Di Kindle. Edilia Bo, M.D.     CSD/MEDQ  D:  06/01/2011  T:  06/01/2011  Job:  [id number]  cc:   Lenn Sink, D.P.M.  Electronically Signed by Waverly Ferrari M.D. on 06/04/2011 10:11:55 AM

## 2011-06-23 ENCOUNTER — Encounter: Payer: Self-pay | Admitting: Vascular Surgery

## 2011-06-24 ENCOUNTER — Encounter: Payer: Self-pay | Admitting: Vascular Surgery

## 2011-06-24 ENCOUNTER — Ambulatory Visit (INDEPENDENT_AMBULATORY_CARE_PROVIDER_SITE_OTHER): Payer: PRIVATE HEALTH INSURANCE | Admitting: Vascular Surgery

## 2011-06-24 VITALS — BP 151/89 | HR 83 | Temp 98.1°F | Ht 71.0 in | Wt 187.0 lb

## 2011-06-24 DIAGNOSIS — I739 Peripheral vascular disease, unspecified: Secondary | ICD-10-CM

## 2011-06-24 DIAGNOSIS — L98499 Non-pressure chronic ulcer of skin of other sites with unspecified severity: Secondary | ICD-10-CM

## 2011-06-24 NOTE — Progress Notes (Signed)
CC: bilateral foot wounds  HPI: Brad Mcintosh is a 76 y.o. male who had bilateral great toe amputations by his podiatrist. He was evaluated by a Pecola Leisure in our office and set up for an arteriogram. I perform this test on 06/01/2011. As he was evaluated last he states that his wounds have gradually improved. He denies any claudication, or rest pain.  SOCIAL HISTORY: History  Substance Use Topics  . Smoking status: Never Smoker   . Smokeless tobacco: Not on file  . Alcohol Use: No    REVIEW OF SYSTEMS: CONSTITUTIONAL:  [ ]  fever   [ ]  chills CARDIOVASCULAR: [ ]  chest pain   [ ]  chest pressure   [ ]  palpitations   [ ]  orthopnea   [ ]  dyspnea on exertion   [ ]  claudication   [ ]  rest pain   [ ]  DVT   [ ]  phlebitis.  PHYSICAL EXAM: Filed Vitals:   06/24/11 1021  BP: 151/89  Pulse: 83  Temp: 98.1 F (36.7 C)   Body mass index is 26.08 kg/(m^2).  GENERAL: The patient appears their stated age. The vital signs are documented above. CARDIOVASCULAR: There is a regular rate and rhythm without significant murmur appreciated. He has palpable femoral pulses. I cannot palpate pedal pulses. PULMONARY: There is good air exchange bilaterally without wheezing or rales. NEUROLOGIC: No focal weakness or paresthesias are detected. SKIN: The great toe amputation site on the left is almost completely healed. On the right there is some slight separation of the incision where the sutures were taken out. No drainage or significant erythema.  DATA: I have reviewed his arteriogram which shows that he has no significant aortoiliac occlusive disease. Femerol and popliteal vessels are patent bilaterally. A single vessel runoff via the peroneal artery bilaterally with no options for revascularization. He does have in line flow through the peroneal artery bilaterally.  MEDICAL ISSUES: As there are no options for revascularization only option is to continue aggressive outpatient wound care. If the wound in  the right great toe failed to heal he could potentially require primary below-the-knee amputation. I'll see him back in one year and I ordered followup ABIs for that time.

## 2011-06-29 ENCOUNTER — Other Ambulatory Visit (HOSPITAL_COMMUNITY): Payer: Self-pay | Admitting: Nephrology

## 2011-06-29 ENCOUNTER — Ambulatory Visit (HOSPITAL_COMMUNITY)
Admission: RE | Admit: 2011-06-29 | Discharge: 2011-06-29 | Disposition: A | Discharge status: Home / Self Care | Payer: PRIVATE HEALTH INSURANCE | Source: Ambulatory Visit | Attending: Nephrology | Admitting: Nephrology

## 2011-06-29 DIAGNOSIS — N186 End stage renal disease: Secondary | ICD-10-CM

## 2011-06-29 DIAGNOSIS — Z452 Encounter for adjustment and management of vascular access device: Secondary | ICD-10-CM | POA: Insufficient documentation

## 2011-07-06 LAB — POCT HEMOGLOBIN-HEMACUE: Hemoglobin: 8.2 — ABNORMAL LOW

## 2011-07-06 LAB — BASIC METABOLIC PANEL
BUN: 55 — ABNORMAL HIGH
Chloride: 114 — ABNORMAL HIGH
Glucose, Bld: 118 — ABNORMAL HIGH
Potassium: 5.4 — ABNORMAL HIGH

## 2011-07-06 LAB — IRON AND TIBC
Saturation Ratios: 23
UIBC: 152

## 2011-07-06 LAB — FERRITIN: Ferritin: 721 — ABNORMAL HIGH (ref 22–322)

## 2011-07-07 ENCOUNTER — Other Ambulatory Visit: Payer: Self-pay | Admitting: Cardiovascular Disease

## 2011-07-07 ENCOUNTER — Ambulatory Visit
Admission: RE | Admit: 2011-07-07 | Discharge: 2011-07-07 | Disposition: A | Discharge status: Home / Self Care | Payer: PRIVATE HEALTH INSURANCE | Source: Ambulatory Visit | Attending: Cardiovascular Disease | Admitting: Cardiovascular Disease

## 2011-07-07 DIAGNOSIS — M549 Dorsalgia, unspecified: Secondary | ICD-10-CM

## 2011-07-07 LAB — RENAL FUNCTION PANEL
Calcium: 9.3
Creatinine, Ser: 5.71 — ABNORMAL HIGH
GFR calc Af Amer: 12 — ABNORMAL LOW
Glucose, Bld: 132 — ABNORMAL HIGH
Phosphorus: 4.7 — ABNORMAL HIGH
Sodium: 140

## 2011-07-07 LAB — IRON AND TIBC
Iron: 37 — ABNORMAL LOW
UIBC: 138

## 2011-07-07 LAB — CBC
HCT: 33.5 — ABNORMAL LOW
MCHC: 32.8
MCV: 87.7
MCV: 87.3
Platelets: 208
Platelets: 209
RDW: 16.9 — ABNORMAL HIGH
RDW: 17.2 — ABNORMAL HIGH
WBC: 4.7
WBC: 4.7

## 2011-07-07 LAB — PTH, INTACT AND CALCIUM: PTH: 201.1 — ABNORMAL HIGH

## 2011-07-08 LAB — CBC
HCT: 36.6 — ABNORMAL LOW
Hemoglobin: 12.1 — ABNORMAL LOW
MCHC: 32.9
MCV: 86.1
Platelets: 212
RDW: 17 — ABNORMAL HIGH

## 2011-07-09 ENCOUNTER — Ambulatory Visit (HOSPITAL_COMMUNITY)
Admission: RE | Admit: 2011-07-09 | Discharge: 2011-07-09 | Disposition: A | Discharge status: Home / Self Care | Payer: PRIVATE HEALTH INSURANCE | Source: Ambulatory Visit | Attending: Nephrology | Admitting: Nephrology

## 2011-07-09 ENCOUNTER — Other Ambulatory Visit (HOSPITAL_COMMUNITY): Payer: Self-pay | Admitting: Nephrology

## 2011-07-09 DIAGNOSIS — Y841 Kidney dialysis as the cause of abnormal reaction of the patient, or of later complication, without mention of misadventure at the time of the procedure: Secondary | ICD-10-CM | POA: Insufficient documentation

## 2011-07-09 DIAGNOSIS — T82898A Other specified complication of vascular prosthetic devices, implants and grafts, initial encounter: Secondary | ICD-10-CM | POA: Insufficient documentation

## 2011-07-09 DIAGNOSIS — I120 Hypertensive chronic kidney disease with stage 5 chronic kidney disease or end stage renal disease: Secondary | ICD-10-CM | POA: Insufficient documentation

## 2011-07-09 DIAGNOSIS — N186 End stage renal disease: Secondary | ICD-10-CM

## 2011-07-09 DIAGNOSIS — E785 Hyperlipidemia, unspecified: Secondary | ICD-10-CM | POA: Insufficient documentation

## 2011-07-09 LAB — CBC
MCHC: 33.7
MCHC: 34.1
MCV: 85
Platelets: 209
Platelets: 217
RBC: 4.03 — ABNORMAL LOW
RDW: 17.4 — ABNORMAL HIGH
RDW: 17.5 — ABNORMAL HIGH

## 2011-07-09 LAB — IRON AND TIBC
Iron: 39 — ABNORMAL LOW
Saturation Ratios: 21
TIBC: 188 — ABNORMAL LOW

## 2011-07-10 LAB — IRON AND TIBC
Saturation Ratios: 61 — ABNORMAL HIGH
UIBC: 67

## 2011-07-10 LAB — RENAL FUNCTION PANEL
BUN: 61 — ABNORMAL HIGH
Chloride: 118 — ABNORMAL HIGH
Creatinine, Ser: 5 — ABNORMAL HIGH
Glucose, Bld: 131 — ABNORMAL HIGH
Phosphorus: 3.9
Potassium: 4.5

## 2011-07-10 LAB — PTH, INTACT AND CALCIUM
Calcium, Total (PTH): 8.3 — ABNORMAL LOW
PTH: 233.3 — ABNORMAL HIGH

## 2011-07-10 LAB — POCT I-STAT 4, (NA,K, GLUC, HGB,HCT)
HCT: 30 — ABNORMAL LOW
Hemoglobin: 10.2 — ABNORMAL LOW
Potassium: 4.7
Sodium: 141

## 2011-07-13 LAB — POCT HEMOGLOBIN-HEMACUE
Hemoglobin: 9.6 — ABNORMAL LOW
Hemoglobin: 10 — ABNORMAL LOW
Hemoglobin: 9.8 — ABNORMAL LOW

## 2011-07-14 LAB — RENAL FUNCTION PANEL
BUN: 69 — ABNORMAL HIGH
Calcium: 8.6
Creatinine, Ser: 5.71 — ABNORMAL HIGH
Glucose, Bld: 127 — ABNORMAL HIGH
Phosphorus: 5.7 — ABNORMAL HIGH

## 2011-07-14 LAB — POCT HEMOGLOBIN-HEMACUE: Hemoglobin: 10 — ABNORMAL LOW

## 2011-07-14 LAB — PTH, INTACT AND CALCIUM: PTH: 270.7 — ABNORMAL HIGH

## 2011-07-17 LAB — FERRITIN: Ferritin: 696 ng/mL — ABNORMAL HIGH (ref 22–322)

## 2011-07-17 LAB — POCT HEMOGLOBIN-HEMACUE
Hemoglobin: 10.3 g/dL — ABNORMAL LOW (ref 13.0–17.0)
Hemoglobin: 10.7 g/dL — ABNORMAL LOW (ref 13.0–17.0)
Hemoglobin: 11 g/dL — ABNORMAL LOW (ref 13.0–17.0)

## 2011-07-17 LAB — IRON AND TIBC: UIBC: 134 ug/dL

## 2011-07-22 LAB — RENAL FUNCTION PANEL
Albumin: 3.1 — ABNORMAL LOW
BUN: 68 — ABNORMAL HIGH
Chloride: 110
GFR calc non Af Amer: 12 — ABNORMAL LOW
Potassium: 5.1

## 2011-07-22 LAB — CBC
HCT: 38.1 — ABNORMAL LOW
Platelets: 246
RDW: 18.4 — ABNORMAL HIGH

## 2011-07-22 LAB — IRON AND TIBC: Saturation Ratios: 24

## 2011-07-22 LAB — PTH, INTACT AND CALCIUM: Calcium, Total (PTH): 8.8

## 2011-07-23 LAB — IRON AND TIBC
Saturation Ratios: 26
TIBC: 198 — ABNORMAL LOW
UIBC: 146

## 2011-07-23 LAB — CBC
Hemoglobin: 12.6 — ABNORMAL LOW
MCHC: 33.1
RBC: 4.58

## 2011-07-24 LAB — IRON AND TIBC
Iron: 34 — ABNORMAL LOW
Saturation Ratios: 18 — ABNORMAL LOW
TIBC: 192 — ABNORMAL LOW
UIBC: 158

## 2011-07-24 LAB — CBC
RBC: 4.59
WBC: 6

## 2011-07-27 LAB — CBC
HCT: 37.6 — ABNORMAL LOW
Hemoglobin: 12.1 — ABNORMAL LOW
MCV: 86
RDW: 17.1 — ABNORMAL HIGH

## 2011-07-27 LAB — IRON AND TIBC: Saturation Ratios: 22

## 2011-07-28 LAB — CBC
Hemoglobin: 11.5 — ABNORMAL LOW
MCV: 85.6
RBC: 4.08 — ABNORMAL LOW
WBC: 4.9

## 2011-07-28 LAB — IRON AND TIBC
Iron: 33 — ABNORMAL LOW
TIBC: 187 — ABNORMAL LOW
UIBC: 154

## 2011-07-28 LAB — FERRITIN: Ferritin: 321 (ref 22–322)

## 2011-07-31 ENCOUNTER — Other Ambulatory Visit (HOSPITAL_COMMUNITY): Payer: Self-pay | Admitting: Nephrology

## 2011-07-31 DIAGNOSIS — N186 End stage renal disease: Secondary | ICD-10-CM

## 2011-08-03 ENCOUNTER — Other Ambulatory Visit (HOSPITAL_COMMUNITY): Payer: Self-pay | Admitting: Nephrology

## 2011-08-03 DIAGNOSIS — N186 End stage renal disease: Secondary | ICD-10-CM

## 2011-08-05 ENCOUNTER — Ambulatory Visit (HOSPITAL_COMMUNITY)
Admission: RE | Admit: 2011-08-05 | Discharge: 2011-08-05 | Disposition: A | Discharge status: Home / Self Care | Payer: Medicare Other | Source: Ambulatory Visit | Attending: Nephrology | Admitting: Nephrology

## 2011-08-05 DIAGNOSIS — Y832 Surgical operation with anastomosis, bypass or graft as the cause of abnormal reaction of the patient, or of later complication, without mention of misadventure at the time of the procedure: Secondary | ICD-10-CM | POA: Insufficient documentation

## 2011-08-05 DIAGNOSIS — N186 End stage renal disease: Secondary | ICD-10-CM | POA: Insufficient documentation

## 2011-08-05 DIAGNOSIS — Z8546 Personal history of malignant neoplasm of prostate: Secondary | ICD-10-CM | POA: Insufficient documentation

## 2011-08-05 DIAGNOSIS — T82898A Other specified complication of vascular prosthetic devices, implants and grafts, initial encounter: Secondary | ICD-10-CM | POA: Insufficient documentation

## 2011-08-05 DIAGNOSIS — I4891 Unspecified atrial fibrillation: Secondary | ICD-10-CM | POA: Insufficient documentation

## 2011-08-05 DIAGNOSIS — I120 Hypertensive chronic kidney disease with stage 5 chronic kidney disease or end stage renal disease: Secondary | ICD-10-CM | POA: Insufficient documentation

## 2011-08-05 DIAGNOSIS — D649 Anemia, unspecified: Secondary | ICD-10-CM | POA: Insufficient documentation

## 2011-08-05 DIAGNOSIS — M109 Gout, unspecified: Secondary | ICD-10-CM | POA: Insufficient documentation

## 2011-10-08 ENCOUNTER — Other Ambulatory Visit (HOSPITAL_COMMUNITY): Payer: Self-pay | Admitting: Physician Assistant

## 2011-10-08 ENCOUNTER — Other Ambulatory Visit (HOSPITAL_COMMUNITY): Payer: Self-pay | Admitting: Nephrology

## 2011-10-08 DIAGNOSIS — N186 End stage renal disease: Secondary | ICD-10-CM

## 2011-10-09 ENCOUNTER — Ambulatory Visit (HOSPITAL_COMMUNITY)
Admission: RE | Admit: 2011-10-09 | Discharge: 2011-10-09 | Disposition: A | Discharge status: Home / Self Care | Payer: Medicare Other | Source: Ambulatory Visit | Attending: Nephrology | Admitting: Nephrology

## 2011-10-09 ENCOUNTER — Encounter (HOSPITAL_COMMUNITY): Payer: Self-pay

## 2011-10-09 ENCOUNTER — Other Ambulatory Visit (HOSPITAL_COMMUNITY): Payer: Self-pay | Admitting: Nephrology

## 2011-10-09 DIAGNOSIS — Z992 Dependence on renal dialysis: Secondary | ICD-10-CM | POA: Insufficient documentation

## 2011-10-09 DIAGNOSIS — N186 End stage renal disease: Secondary | ICD-10-CM

## 2011-10-09 DIAGNOSIS — T82598A Other mechanical complication of other cardiac and vascular devices and implants, initial encounter: Secondary | ICD-10-CM | POA: Insufficient documentation

## 2011-10-09 DIAGNOSIS — Y841 Kidney dialysis as the cause of abnormal reaction of the patient, or of later complication, without mention of misadventure at the time of the procedure: Secondary | ICD-10-CM | POA: Insufficient documentation

## 2011-10-09 DIAGNOSIS — I120 Hypertensive chronic kidney disease with stage 5 chronic kidney disease or end stage renal disease: Secondary | ICD-10-CM | POA: Insufficient documentation

## 2011-10-09 MED ORDER — CHLORHEXIDINE GLUCONATE 4 % EX LIQD
CUTANEOUS | Status: AC
  Filled 2011-10-09: qty 30

## 2011-10-09 MED ORDER — SODIUM CHLORIDE 0.9 % IV SOLN: INTRAVENOUS | Status: DC

## 2011-10-09 MED ORDER — HEPARIN SODIUM (PORCINE) 1000 UNIT/ML IJ SOLN
INTRAMUSCULAR | Status: AC
  Filled 2011-10-09: qty 1

## 2011-10-09 MED ORDER — CEFAZOLIN SODIUM 1-5 GM-% IV SOLN
INTRAVENOUS | Status: AC
  Administered 2011-10-09: 1 g
  Filled 2011-10-09: qty 50

## 2011-10-09 MED ORDER — CEFAZOLIN SODIUM 1-5 GM-% IV SOLN: 1.0000 g | Freq: Once | INTRAVENOUS | Status: DC

## 2011-10-09 NOTE — H&P (Signed)
Brad Mcintosh is an 76 y.o. male.   Chief Complaint: Hemodialysis catheter malposition; cuff exposed HPI: scheduled today for HD catheter exchange.  Past Medical History  Diagnosis Date  . Hypertension   . Hyperlipidemia   . Cancer     prostate  . Gout   . Anemia     Iron deficiency  . Thyroid disease   . Malnutrition   . Atrial fibrillation     paroxysmal  . Chronic kidney disease     Pt has HD on TUESDAY, THURSDAY and SATURDAY  at [facility]    Past Surgical History  Procedure Date  . Av fistula placement 06/2006    left forearm  . Amputation     right finger  . Incise and drain abcess     chronic complex infection right long finger  . Insertion of dialysis catheter 05/30/2010    right internal jugular Palindrome catheter  . Aortogram 06/01/11    w/ runoff    History reviewed. No pertinent family history. Social History:  reports that he has never smoked. He does not have any smokeless tobacco history on file. He reports that he does not drink alcohol or use illicit drugs.  Allergies: No Known Allergies  Medications Prior to Admission  Medication Sig Dispense Refill  . aspirin 81 MG tablet Take 81 mg by mouth daily.        . citalopram (CELEXA) 20 MG tablet Take 20 mg by mouth at bedtime as needed.        . colchicine 0.6 MG tablet Take 0.6 mg by mouth as needed.        . diltiazem (TIAZAC) 180 MG 24 hr capsule Take 180 mg by mouth 2 (two) times daily.        Marland Kitchen gabapentin (NEURONTIN) 100 MG capsule       . HYDROcodone-acetaminophen (NORCO) 5-325 MG per tablet Take 1 tablet by mouth at bedtime as needed.        . metoprolol (TOPROL-XL) 100 MG 24 hr tablet Take 100 mg by mouth daily.        . pantoprazole (PROTONIX) 40 MG tablet Take 40 mg by mouth daily.        . simvastatin (ZOCOR) 10 MG tablet Take 10 mg by mouth at bedtime.        [name] 1 % cream        Medications Prior to Admission  Medication Dose Route Frequency Provider Last Rate Last Dose  .  ceFAZolin (ANCEF) 1-5 GM-% IVPB             No results found for this or any previous visit (from the past 48 hour(s)). No results found.  Review of Systems  Constitutional: Negative for fever.  Cardiovascular: Negative for chest pain.  Gastrointestinal: Negative for nausea, vomiting and abdominal pain.  Neurological: Negative for headaches.    There were no vitals taken for this visit. Physical Exam  Constitutional: He is oriented to person, place, and time. He appears well-developed and well-nourished.  HENT:  Head: Normocephalic.  Eyes: EOM are normal.  Neck: Normal range of motion.  Cardiovascular: Normal rate, regular rhythm and normal heart sounds.   No murmur heard. Respiratory: Effort normal and breath sounds normal. He has no wheezes.  GI: Soft. Bowel sounds are normal. He exhibits no mass.  Musculoskeletal: Normal range of motion.  Neurological: He is alert and oriented to person, place, and time.  Skin: Skin is warm and  dry.     Assessment/Plan ESRD; Hemodialysis catheter malposition; cuff exposure. Scheduled for exchange now Pt aware of procedure benefits and risks and agreeable to proceed.. Consent signed.  Creasie Lacosse A 10/09/2011, 2:09 PM

## 2011-10-09 NOTE — Procedures (Signed)
Old dialysis cuff was exposed.  Replaced catheter with a 23 cm Equistream. Tip in right atrium.  Ready to use.

## 2011-10-09 NOTE — H&P (Signed)
Agree with PA note. 

## 2011-10-26 ENCOUNTER — Ambulatory Visit: Payer: Self-pay | Admitting: Vascular Surgery

## 2011-10-26 LAB — BASIC METABOLIC PANEL
Anion Gap: 18 — ABNORMAL HIGH (ref 7–16)
Calcium, Total: 9.1 mg/dL (ref 8.5–10.1)
Co2: 26 mmol/L (ref 21–32)
Creatinine: 11.41 mg/dL — ABNORMAL HIGH (ref 0.60–1.30)
EGFR (African American): 6 — ABNORMAL LOW
EGFR (Non-African Amer.): 5 — ABNORMAL LOW
Glucose: 98 mg/dL (ref 65–99)
Osmolality: 298 (ref 275–301)
Sodium: 140 mmol/L (ref 136–145)

## 2011-11-09 ENCOUNTER — Ambulatory Visit: Payer: Self-pay | Admitting: Vascular Surgery

## 2011-11-09 DIAGNOSIS — I10 Essential (primary) hypertension: Secondary | ICD-10-CM

## 2011-11-09 LAB — BASIC METABOLIC PANEL
Anion Gap: 17 — ABNORMAL HIGH (ref 7–16)
Anion Gap: 18 — ABNORMAL HIGH (ref 7–16)
BUN: 69 mg/dL — ABNORMAL HIGH (ref 7–18)
Calcium, Total: 9.4 mg/dL (ref 8.5–10.1)
Calcium, Total: 9.2 mg/dL (ref 8.5–10.1)
Chloride: 99 mmol/L (ref 98–107)
Co2: 21 mmol/L (ref 21–32)
Creatinine: 11.65 mg/dL — ABNORMAL HIGH (ref 0.60–1.30)
EGFR (African American): 6 — ABNORMAL LOW
EGFR (African American): 6 — ABNORMAL LOW
EGFR (Non-African Amer.): 5 — ABNORMAL LOW
EGFR (Non-African Amer.): 5 — ABNORMAL LOW
Glucose: 83 mg/dL (ref 65–99)
Osmolality: 293 (ref 275–301)
Osmolality: 295 (ref 275–301)
Potassium: 5.1 mmol/L (ref 3.5–5.1)
Sodium: 137 mmol/L (ref 136–145)
Sodium: 138 mmol/L (ref 136–145)

## 2011-11-18 ENCOUNTER — Ambulatory Visit: Payer: Self-pay | Admitting: Vascular Surgery

## 2011-12-23 ENCOUNTER — Ambulatory Visit: Payer: Self-pay | Admitting: Vascular Surgery

## 2012-02-03 ENCOUNTER — Other Ambulatory Visit: Payer: Self-pay

## 2012-02-03 DIAGNOSIS — I70229 Atherosclerosis of native arteries of extremities with rest pain, unspecified extremity: Secondary | ICD-10-CM

## 2012-02-16 ENCOUNTER — Encounter: Payer: Self-pay | Admitting: Vascular Surgery

## 2012-02-17 ENCOUNTER — Ambulatory Visit (INDEPENDENT_AMBULATORY_CARE_PROVIDER_SITE_OTHER): Payer: Medicare Other | Admitting: Vascular Surgery

## 2012-02-17 ENCOUNTER — Encounter: Payer: Self-pay | Admitting: Vascular Surgery

## 2012-02-17 ENCOUNTER — Encounter (INDEPENDENT_AMBULATORY_CARE_PROVIDER_SITE_OTHER): Payer: Medicare Other

## 2012-02-17 VITALS — BP 137/71 | HR 99 | Resp 20 | Ht 71.0 in | Wt 202.0 lb

## 2012-02-17 DIAGNOSIS — I70209 Unspecified atherosclerosis of native arteries of extremities, unspecified extremity: Secondary | ICD-10-CM | POA: Insufficient documentation

## 2012-02-17 DIAGNOSIS — I739 Peripheral vascular disease, unspecified: Secondary | ICD-10-CM

## 2012-02-17 DIAGNOSIS — R29898 Other symptoms and signs involving the musculoskeletal system: Secondary | ICD-10-CM

## 2012-02-17 DIAGNOSIS — L98499 Non-pressure chronic ulcer of skin of other sites with unspecified severity: Secondary | ICD-10-CM

## 2012-02-17 NOTE — Progress Notes (Signed)
[facility]  Patient name: Brad Mcintosh MRN: [id number] DOB: May 16, 1935 Sex: male  REASON FOR VISIT: wound on right third toe with history of peripheral vascular disease. Referred by Dr. Arrie Aran.  HPI: Brad Mcintosh is a 77 y.o. male who states that approximately a month ago he lost the toenail from his right third toe. He said a small wound on his toe and was sent for vascular consultation. He does describe some mild bilateral lower extremity claudication. He experienced pain in his calf which is brought on by ambulation and relieved with rest. He does not describe any significant rest pain although he has had some pain in the right third toe.  Of note this patient has undergone previous PTCA the left anterior tibial artery at [facility] by Dr. Wyn Quaker. He is scheduled to see him back in July.  The patient denies any fever or chills.  Past Medical History  Diagnosis Date  . Hypertension   . Hyperlipidemia   . Cancer     prostate  . Gout   . Anemia     Iron deficiency  . Thyroid disease   . Malnutrition   . Atrial fibrillation     paroxysmal  . Chronic kidney disease     Pt has HD on TUESDAY, THURSDAY and SATURDAY  at [facility]    Family History  Problem Relation Age of Onset  . Hypertension Mother   . Asthma Father   . Cancer Sister     breast  . Kidney disease Brother   . Diabetes Brother     SOCIAL HISTORY: History  Substance Use Topics  . Smoking status: Former Smoker -- 0.5 packs/day    Types: Cigarettes    Quit date: 02/17/1972  . Smokeless tobacco: Not on file  . Alcohol Use: No    No Known Allergies  Current Outpatient Prescriptions  Medication Sig Dispense Refill  . aspirin 81 MG tablet Take 81 mg by mouth daily.        . citalopram (CELEXA) 20 MG tablet Take 20 mg by mouth at bedtime as needed.        . colchicine 0.6 MG tablet Take 0.6 mg by mouth as needed.        . diltiazem (TIAZAC) 180 MG 24 hr  capsule Take 180 mg by mouth 2 (two) times daily.        Marland Kitchen gabapentin (NEURONTIN) 100 MG capsule 100 mg 3 (three) times daily.       Marland Kitchen HYDROcodone-acetaminophen (NORCO) 5-325 MG per tablet Take 1 tablet by mouth at bedtime as needed.        . metoprolol (TOPROL-XL) 100 MG 24 hr tablet Take 100 mg by mouth daily.        . pantoprazole (PROTONIX) 40 MG tablet Take 40 mg by mouth daily.        . simvastatin (ZOCOR) 10 MG tablet Take 10 mg by mouth at bedtime.        [name] 1 % cream Apply 1 application topically as needed.         REVIEW OF SYSTEMS: Brad Mcintosh ] denotes positive finding; [  ] denotes negative finding  CARDIOVASCULAR:  [ ]  chest pain   [ ]  chest pressure   [ ]  palpitations   [ ]  orthopnea   Brad Mcintosh ] dyspnea on exertion   Brad Mcintosh ] claudication   [ ]  rest pain   [ ]  DVT   [ ]   phlebitis PULMONARY:   [ ]  productive cough   [ ]  asthma   [ ]  wheezing NEUROLOGIC:   [ ]  weakness  [ ]  paresthesias  [ ]  aphasia  [ ]  amaurosis  [ ]  dizziness HEMATOLOGIC:   [ ]  bleeding problems   [ ]  clotting disorders MUSCULOSKELETAL:  [ ]  joint pain   [ ]  joint swelling [ ]  leg swelling GASTROINTESTINAL: [ ]   blood in stool  [ ]   hematemesis GENITOURINARY:  [ ]   dysuria  [ ]   hematuria PSYCHIATRIC:  [ ]  history of major depression INTEGUMENTARY:  [ ]  rashes  [ ]  ulcers CONSTITUTIONAL:  [ ]  fever   [ ]  chills  PHYSICAL EXAM: Filed Vitals:   02/17/12 0922  BP: 137/71  Pulse: 99  Resp: 20  Height: 5\' 11"  (1.803 m)  Weight: 202 lb (91.627 kg)   Body mass index is 28.17 kg/(m^2). GENERAL: The patient is a well-nourished male, in no acute distress. The vital signs are documented above. CARDIOVASCULAR: There is a regular rate and rhythm without significant murmur appreciated. I do not detect carotid bruits. He has palpable femoral pulses. On the left side he has a palpable popliteal pulse.  I cannot palpate pedal pulses on the left. On the right side he has a palpable femoral pulse but no pulses below that. He  has a good thrill in his left upper arm AV fistula. PULMONARY: There is good air exchange bilaterally without wheezing or rales. ABDOMEN: Soft and non-tender with normal pitched bowel sounds.  MUSCULOSKELETAL: He's had previous great toe amputations bilaterally. NEUROLOGIC: No focal weakness or paresthesias are detected. SKIN: he has a superficial ulcer on his right third toe. Do not see any other ulcers or drainage. PSYCHIATRIC: The patient has a normal affect.  DATA:  I did perform a Doppler study in our office today. He has a monophasic posterior tibial and dorsalis pedis signal on the right. He has a monophasic left dorsalis pedis signal I cannot obtain a posterior tibial signal.   I reviewed his records sent from [facility]. Due to his labs from their office.  MEDICAL ISSUES: Based on his exam he has evidence of infrainguinal arterial occlusive disease bilaterally. He actually has fairly reasonable Doppler signals in the right foot in the wound currently is dry without erythema or drainage. He is scheduled to see his vascular surgeon in [place] back in July. I have encouraged him to call sooner if there is any progression of his wound. Certainly if the wound progresses she would require arteriography however given that he said previous worked in [place] it is probably best for him to continue his follow up care. Patient is in agreement with that. We'll be happy to see him back at any time if needed.  [name] S [facility] [place] Beeper: [phone]

## 2012-02-23 ENCOUNTER — Ambulatory Visit (HOSPITAL_COMMUNITY)
Admission: RE | Admit: 2012-02-23 | Discharge: 2012-02-23 | Disposition: A | Discharge status: Home / Self Care | Payer: Medicare Other | Source: Ambulatory Visit | Attending: Nephrology | Admitting: Nephrology

## 2012-02-23 ENCOUNTER — Other Ambulatory Visit: Payer: Self-pay | Admitting: Nephrology

## 2012-02-23 ENCOUNTER — Other Ambulatory Visit (HOSPITAL_COMMUNITY): Payer: Self-pay | Admitting: Nephrology

## 2012-02-23 DIAGNOSIS — N186 End stage renal disease: Secondary | ICD-10-CM

## 2012-02-23 DIAGNOSIS — T80218A Other infection due to central venous catheter, initial encounter: Secondary | ICD-10-CM | POA: Insufficient documentation

## 2012-02-23 DIAGNOSIS — M546 Pain in thoracic spine: Secondary | ICD-10-CM

## 2012-02-23 DIAGNOSIS — M542 Cervicalgia: Secondary | ICD-10-CM

## 2012-02-23 DIAGNOSIS — Y841 Kidney dialysis as the cause of abnormal reaction of the patient, or of later complication, without mention of misadventure at the time of the procedure: Secondary | ICD-10-CM | POA: Insufficient documentation

## 2012-02-23 MED ORDER — CHLORHEXIDINE GLUCONATE 4 % EX LIQD
CUTANEOUS | Status: AC
  Filled 2012-02-23: qty 45

## 2012-02-23 NOTE — Procedures (Signed)
Removal of (R)sided Permcath dialysis catheter due to infection. Tip sent for culture. No complications.

## 2012-02-26 LAB — CATH TIP CULTURE

## 2012-02-29 ENCOUNTER — Inpatient Hospital Stay: Admission: RE | Admit: 2012-02-29 | Payer: Medicare Other | Source: Ambulatory Visit

## 2012-02-29 ENCOUNTER — Other Ambulatory Visit: Payer: Medicare Other

## 2012-03-16 ENCOUNTER — Ambulatory Visit
Admission: RE | Admit: 2012-03-16 | Discharge: 2012-03-16 | Disposition: A | Discharge status: Home / Self Care | Payer: Medicare Other | Source: Ambulatory Visit | Attending: Nephrology | Admitting: Nephrology

## 2012-03-16 DIAGNOSIS — M542 Cervicalgia: Secondary | ICD-10-CM

## 2012-03-16 DIAGNOSIS — M546 Pain in thoracic spine: Secondary | ICD-10-CM

## 2012-06-21 ENCOUNTER — Encounter: Payer: Self-pay | Admitting: Vascular Surgery

## 2012-06-22 ENCOUNTER — Ambulatory Visit: Payer: PRIVATE HEALTH INSURANCE | Admitting: Vascular Surgery

## 2012-06-29 ENCOUNTER — Other Ambulatory Visit: Payer: PRIVATE HEALTH INSURANCE

## 2012-06-29 ENCOUNTER — Ambulatory Visit: Payer: PRIVATE HEALTH INSURANCE | Admitting: Vascular Surgery

## 2012-07-12 ENCOUNTER — Encounter: Payer: Self-pay | Admitting: Vascular Surgery

## 2012-07-13 ENCOUNTER — Ambulatory Visit: Payer: Medicare Other | Admitting: Vascular Surgery

## 2012-08-05 ENCOUNTER — Encounter: Payer: Self-pay | Admitting: Neurosurgery

## 2012-08-08 ENCOUNTER — Ambulatory Visit: Payer: Medicare Other | Admitting: Neurosurgery

## 2012-08-09 ENCOUNTER — Encounter: Payer: Self-pay | Admitting: Neurosurgery

## 2012-08-10 ENCOUNTER — Encounter: Payer: Self-pay | Admitting: Neurosurgery

## 2012-08-10 ENCOUNTER — Encounter (INDEPENDENT_AMBULATORY_CARE_PROVIDER_SITE_OTHER): Payer: Medicare Other | Admitting: *Deleted

## 2012-08-10 ENCOUNTER — Ambulatory Visit (INDEPENDENT_AMBULATORY_CARE_PROVIDER_SITE_OTHER): Payer: Medicare Other | Admitting: Neurosurgery

## 2012-08-10 VITALS — BP 145/78 | HR 68 | Resp 16 | Ht 71.5 in | Wt 204.6 lb

## 2012-08-10 DIAGNOSIS — Z48812 Encounter for surgical aftercare following surgery on the circulatory system: Secondary | ICD-10-CM

## 2012-08-10 DIAGNOSIS — I739 Peripheral vascular disease, unspecified: Secondary | ICD-10-CM | POA: Insufficient documentation

## 2012-08-10 NOTE — Progress Notes (Signed)
[facility]  [facility]/PVD Office Note  CC: PVD surveillance Referring Physician: Edilia Bo  History of Present Illness: 77 year old male patient of Dr. Adele Dan status post bilateral great toe amputation 2012, the patient also states he had lower extremity intervention in [place] but does not know exactly what he had done. The patient denies rest pain, claudication has no open ulcerations on his lower extremities.  Past Medical History  Diagnosis Date  . Hypertension   . Hyperlipidemia   . Cancer     prostate  . Gout   . Anemia     Iron deficiency  . Thyroid disease   . Malnutrition   . Atrial fibrillation     paroxysmal  . Chronic kidney disease     Pt has HD on TUESDAY, THURSDAY and SATURDAY  at [facility]    ROS: [x]  Positive   [ ]  Denies    General: [ ]  Weight loss, [ ]  Fever, [ ]  chills Neurologic: [ ]  Dizziness, [ ]  Blackouts, [ ]  Seizure [ ]  Stroke, [ ]  "Mini stroke", [ ]  Slurred speech, [ ]  Temporary blindness; [ ]  weakness in arms or legs, [ ]  Hoarseness Cardiac: [ ]  Chest pain/pressure, [ ]  Shortness of breath at rest [ ]  Shortness of breath with exertion, [ ]  Atrial fibrillation or irregular heartbeat Vascular: [ ]  Pain in legs with walking, [ ]  Pain in legs at rest, [ ]  Pain in legs at night,  [ ]  Non-healing ulcer, [ ]  Blood clot in vein/DVT,   Pulmonary: [ ]  Home oxygen, [ ]  Productive cough, [ ]  Coughing up blood, [ ]  Asthma,  [ ]  Wheezing Musculoskeletal:  [ ]  Arthritis, [ ]  Low back pain, [ ]  Joint pain Hematologic: [ ]  Easy Bruising, [ ]  Anemia; [ ]  Hepatitis Gastrointestinal: [ ]  Blood in stool, [ ]  Gastroesophageal Reflux/heartburn, [ ]  Trouble swallowing Urinary: [ ]  chronic Kidney disease, [ ]  on HD - [ ]  MWF or [ ]  TTHS, [ ]  Burning with urination, [ ]  Difficulty urinating Skin: [ ]  Rashes, [ ]  Wounds Psychological: [ ]  Anxiety, [ ]  Depression   Social History History  Substance Use Topics  . Smoking status: Former  Smoker -- 0.5 packs/day    Types: Cigarettes    Quit date: 02/17/1972  . Smokeless tobacco: Not on file  . Alcohol Use: No    Family History Family History  Problem Relation Age of Onset  . Hypertension Mother   . Asthma Father   . Cancer Sister     breast  . Kidney disease Brother   . Diabetes Brother     No Known Allergies  Current Outpatient Prescriptions  Medication Sig Dispense Refill  . aspirin 81 MG tablet Take 81 mg by mouth daily.        . calcium acetate (PHOSLO) 667 MG capsule       . citalopram (CELEXA) 20 MG tablet Take 20 mg by mouth at bedtime as needed.        . colchicine 0.6 MG tablet Take 0.6 mg by mouth as needed.        . diltiazem (TIAZAC) 180 MG 24 hr capsule Take 180 mg by mouth 2 (two) times daily.        Marland Kitchen gabapentin (NEURONTIN) 100 MG capsule 100 mg 3 (three) times daily.       Marland Kitchen HYDROcodone-acetaminophen (NORCO) 5-325 MG per tablet Take 1 tablet by mouth at bedtime as needed.        Marland Kitchen  metoprolol (TOPROL-XL) 100 MG 24 hr tablet Take 100 mg by mouth daily.        . pantoprazole (PROTONIX) 40 MG tablet Take 40 mg by mouth daily.        . simvastatin (ZOCOR) 10 MG tablet Take 10 mg by mouth at bedtime.        [name] 1 % cream Apply 1 application topically as needed.         Physical Examination  Filed Vitals:   08/10/12 1353  BP: 145/78  Pulse: 68  Resp: 16    Body mass index is 28.14 kg/(m^2).  General:  WDWN in NAD Gait: Normal HEENT: WNL Eyes: Pupils equal Pulmonary: normal non-labored breathing , without Rales, rhonchi,  wheezing Cardiac: RRR, without  Murmurs, rubs or gallops; No carotid bruits Abdomen: soft, NT, no masses Skin: no rashes, ulcers noted Vascular Exam/Pulses: Lower extremity pulses are not palpable  Extremities without ischemic changes, no Gangrene , no cellulitis; no open wounds;  Musculoskeletal: no muscle wasting or atrophy  Neurologic: A&O X 3; Appropriate Affect ; SENSATION: normal; MOTOR FUNCTION:   moving all extremities equally. Speech is fluent/normal  Non-Invasive Vascular Imaging: ABIs were not obtained due to noncompressible vessels bilaterally which is unchanged from previous, however he does have biphasic flow in the posterior tibial and dorsalis pedis bilaterally  ASSESSMENT/PLAN: Asymptomatic patient with noncompressible vessels, the patient will followup in one year with repeat ABIs, his questions were encouraged and answered, he is in agreement with this plan.  Lauree Chandler ANP  Clinic M.D.: Hart [place] on call

## 2012-08-11 NOTE — Addendum Note (Signed)
Addended by: Sharee Pimple on: 08/11/2012 12:41 PM   Modules accepted: Orders

## 2012-10-13 ENCOUNTER — Encounter (HOSPITAL_COMMUNITY): Payer: Self-pay | Admitting: Emergency Medicine

## 2012-10-13 ENCOUNTER — Emergency Department (HOSPITAL_COMMUNITY)
Admission: EM | Admit: 2012-10-13 | Discharge: 2012-10-14 | Disposition: A | Discharge status: Home / Self Care | Payer: Medicare Other | Attending: Emergency Medicine | Admitting: Emergency Medicine

## 2012-10-13 DIAGNOSIS — M109 Gout, unspecified: Secondary | ICD-10-CM | POA: Insufficient documentation

## 2012-10-13 DIAGNOSIS — Z8546 Personal history of malignant neoplasm of prostate: Secondary | ICD-10-CM | POA: Insufficient documentation

## 2012-10-13 DIAGNOSIS — E785 Hyperlipidemia, unspecified: Secondary | ICD-10-CM | POA: Insufficient documentation

## 2012-10-13 DIAGNOSIS — Z862 Personal history of diseases of the blood and blood-forming organs and certain disorders involving the immune mechanism: Secondary | ICD-10-CM | POA: Insufficient documentation

## 2012-10-13 DIAGNOSIS — J189 Pneumonia, unspecified organism: Secondary | ICD-10-CM | POA: Insufficient documentation

## 2012-10-13 DIAGNOSIS — Z87891 Personal history of nicotine dependence: Secondary | ICD-10-CM | POA: Insufficient documentation

## 2012-10-13 DIAGNOSIS — Z7982 Long term (current) use of aspirin: Secondary | ICD-10-CM | POA: Insufficient documentation

## 2012-10-13 DIAGNOSIS — N186 End stage renal disease: Secondary | ICD-10-CM | POA: Insufficient documentation

## 2012-10-13 DIAGNOSIS — Z992 Dependence on renal dialysis: Secondary | ICD-10-CM | POA: Insufficient documentation

## 2012-10-13 DIAGNOSIS — I120 Hypertensive chronic kidney disease with stage 5 chronic kidney disease or end stage renal disease: Secondary | ICD-10-CM | POA: Insufficient documentation

## 2012-10-13 DIAGNOSIS — Z8639 Personal history of other endocrine, nutritional and metabolic disease: Secondary | ICD-10-CM | POA: Insufficient documentation

## 2012-10-13 DIAGNOSIS — Z79899 Other long term (current) drug therapy: Secondary | ICD-10-CM | POA: Insufficient documentation

## 2012-10-13 NOTE — ED Notes (Signed)
Pt alert, arrives from home, c/o sudden onset sob after bending down to tie his shoes, states SOB is relieved, resp even unlabored, skin pwd, pt currently on dialysis, T,TH, Sat, denies Chest Pain

## 2012-10-14 ENCOUNTER — Emergency Department (HOSPITAL_COMMUNITY): Payer: Medicare Other

## 2012-10-14 MED ORDER — AZITHROMYCIN 250 MG PO TABS
500.0000 mg | ORAL_TABLET | Freq: Once | ORAL | Status: AC
  Administered 2012-10-14: 500 mg via ORAL
  Filled 2012-10-14: qty 2

## 2012-10-14 MED ORDER — AZITHROMYCIN 250 MG PO TABS: 250.0000 mg | ORAL_TABLET | Freq: Every day | ORAL | Status: DC

## 2012-10-14 MED ORDER — CEFTRIAXONE SODIUM 1 G IJ SOLR
1.0000 g | INTRAMUSCULAR | Status: DC
  Administered 2012-10-14: 1 g via INTRAMUSCULAR
  Filled 2012-10-14: qty 10

## 2012-10-14 NOTE — ED Notes (Signed)
Pt denying ShOB at this time. Reports that he does not have a hx of resp. Problems in the past

## 2012-10-14 NOTE — ED Notes (Signed)
Pt noted to have dropping oxygen saturation while sleeping, pt put on 2L [place] and immediately returned to 100%

## 2012-10-14 NOTE — ED Provider Notes (Signed)
History     CSN: [id number]  Arrival date & time 10/13/12  2227   First MD Initiated Contact with Patient 10/14/12 0019      Chief Complaint  Patient presents with  . Shortness of Breath    (Consider location/radiation/quality/duration/timing/severity/associated sxs/prior treatment) HPI Comments: Patient states he bent down to tie his shoes and became acutely SOB that resolved shortly after he sat upright.  Has no other complaints at this time He had  dialysis today for full length of time States he just "did not want to take any chances"  Patient is a 78 y.o. male presenting with shortness of breath. The history is provided by the patient.  Shortness of Breath  The current episode started today. The problem has been resolved. The problem is mild. The symptoms are relieved by rest. The symptoms are aggravated by activity. Associated symptoms include cough and shortness of breath. Pertinent negatives include no chest pain, no chest pressure, no orthopnea, no fever, no rhinorrhea and no wheezing.    Past Medical History  Diagnosis Date  . Hypertension   . Hyperlipidemia   . Cancer     prostate  . Gout   . Anemia     Iron deficiency  . Thyroid disease   . Malnutrition   . Atrial fibrillation     paroxysmal  . Chronic kidney disease     Pt has HD on TUESDAY, THURSDAY and SATURDAY  at [facility]    Past Surgical History  Procedure Date  . Av fistula placement 06/2006    left forearm  . Amputation     right finger  . Incise and drain abcess     chronic complex infection right long finger  . Insertion of dialysis catheter 05/30/2010    right internal jugular Palindrome catheter  . Aortogram 06/01/11    w/ runoff    Family History  Problem Relation Age of Onset  . Hypertension Mother   . Asthma Father   . Cancer Sister     breast  . Kidney disease Brother   . Diabetes Brother     History  Substance Use Topics  . Smoking status: Former Smoker -- 0.5 packs/day    Types: Cigarettes    Quit date: 02/17/1972  . Smokeless tobacco: Not on file  . Alcohol Use: No      Review of Systems  Constitutional: Negative for fever.  HENT: Negative for rhinorrhea.   Eyes: Negative.   Respiratory: Positive for cough and shortness of breath. Negative for wheezing.   Cardiovascular: Negative for chest pain, orthopnea and leg swelling.  Gastrointestinal: Negative for nausea, vomiting, abdominal pain and constipation.  Neurological: Negative for dizziness and headaches.    Allergies  Review of patient's allergies indicates no known allergies.  Home Medications   Current Outpatient Rx  Name  Route  Sig  Dispense  Refill  . ASPIRIN 81 MG PO TABS   Oral   Take 81 mg by mouth daily.           Marland Kitchen CALCIUM ACETATE 667 MG PO CAPS               . CITALOPRAM HYDROBROMIDE 20 MG PO TABS   Oral   Take 20 mg by mouth at bedtime as needed. Depression         . COLCHICINE 0.6 MG PO TABS   Oral   Take 0.6 mg by mouth as needed.           Marland Kitchen  DILTIAZEM HCL ER BEADS 180 MG PO CP24   Oral   Take 180 mg by mouth 2 (two) times daily.           Marland Kitchen GABAPENTIN 100 MG PO CAPS      100 mg 3 (three) times daily.          Marland Kitchen HYDROCODONE-ACETAMINOPHEN 5-325 MG PO TABS   Oral   Take 1 tablet by mouth at bedtime as needed. pain         . METOPROLOL SUCCINATE ER 100 MG PO TB24   Oral   Take 100 mg by mouth daily.           Marland Kitchen PANTOPRAZOLE SODIUM 40 MG PO TBEC   Oral   Take 40 mg by mouth daily.           Marland Kitchen SIMVASTATIN 10 MG PO TABS   Oral   Take 10 mg by mouth at bedtime.           [name] 1 % EX CREA   Topical   Apply 1 application topically as needed.          . AZITHROMYCIN 250 MG PO TABS   Oral   Take 1 tablet (250 mg total) by mouth daily.   4 tablet   0     BP 173/89  Pulse 89  Temp 98 F (36.7 C) (Oral)  Resp 16  SpO2 100%  Physical Exam  Constitutional: He is oriented to person, place, and time. He appears  well-developed and well-nourished.  HENT:  Head: Normocephalic.  Eyes: Pupils are equal, round, and reactive to light.  Neck: Normal range of motion.  Cardiovascular: Normal rate.   Pulmonary/Chest: Effort normal.  Abdominal: Soft. He exhibits no distension. There is no tenderness.  Musculoskeletal: Normal range of motion.  Neurological: He is alert and oriented to person, place, and time.  Skin: Skin is warm.    ED Course  Procedures (including critical care time)  Labs Reviewed - No data to display Dg Chest 2 View  10/14/2012  *RADIOLOGY REPORT*  Clinical Data: Shortness of breath.  CHEST - 2 VIEW  Comparison: Chest radiograph performed 07/07/2011  Findings: The lungs are well-aerated.  Mild left basilar airspace opacity raises concern for mild pneumonia.  Mild vascular congestion is seen.  There is no evidence of pleural effusion or pneumothorax.  The heart is normal in size; calcification is noted within the aortic arch.  No acute osseous abnormalities are seen.  IMPRESSION:  1.  Mild left basilar airspace opacity raises concern for mild pneumonia. 2.  Mild vascular congestion seen.   Original Report Authenticated By: Tonia Ghent, M.D.      1. CAP (community acquired pneumonia)       MDM  Pneumonia on xray         Arman Filter, NP 10/14/12 [phone]

## 2012-10-14 NOTE — ED Provider Notes (Signed)
Medical screening examination/treatment/procedure(s) were performed by non-physician practitioner and as supervising physician I was immediately available for consultation/collaboration.   Jerrin Recore L Olsen Mccutchan, MD 10/14/12 0708 

## 2012-10-28 ENCOUNTER — Emergency Department (HOSPITAL_COMMUNITY)
Admission: EM | Admit: 2012-10-28 | Discharge: 2012-10-29 | Disposition: A | Discharge status: Home / Self Care | Payer: Medicare Other | Attending: Emergency Medicine | Admitting: Emergency Medicine

## 2012-10-28 ENCOUNTER — Emergency Department (HOSPITAL_COMMUNITY): Payer: Medicare Other

## 2012-10-28 DIAGNOSIS — Z8546 Personal history of malignant neoplasm of prostate: Secondary | ICD-10-CM | POA: Insufficient documentation

## 2012-10-28 DIAGNOSIS — Z862 Personal history of diseases of the blood and blood-forming organs and certain disorders involving the immune mechanism: Secondary | ICD-10-CM | POA: Insufficient documentation

## 2012-10-28 DIAGNOSIS — M542 Cervicalgia: Secondary | ICD-10-CM | POA: Insufficient documentation

## 2012-10-28 DIAGNOSIS — W1809XA Striking against other object with subsequent fall, initial encounter: Secondary | ICD-10-CM | POA: Insufficient documentation

## 2012-10-28 DIAGNOSIS — Z7982 Long term (current) use of aspirin: Secondary | ICD-10-CM | POA: Insufficient documentation

## 2012-10-28 DIAGNOSIS — I4891 Unspecified atrial fibrillation: Secondary | ICD-10-CM | POA: Insufficient documentation

## 2012-10-28 DIAGNOSIS — Z87891 Personal history of nicotine dependence: Secondary | ICD-10-CM | POA: Insufficient documentation

## 2012-10-28 DIAGNOSIS — I129 Hypertensive chronic kidney disease with stage 1 through stage 4 chronic kidney disease, or unspecified chronic kidney disease: Secondary | ICD-10-CM | POA: Insufficient documentation

## 2012-10-28 DIAGNOSIS — W010XXA Fall on same level from slipping, tripping and stumbling without subsequent striking against object, initial encounter: Secondary | ICD-10-CM | POA: Insufficient documentation

## 2012-10-28 DIAGNOSIS — Z79899 Other long term (current) drug therapy: Secondary | ICD-10-CM | POA: Insufficient documentation

## 2012-10-28 DIAGNOSIS — N189 Chronic kidney disease, unspecified: Secondary | ICD-10-CM | POA: Insufficient documentation

## 2012-10-28 DIAGNOSIS — Z8639 Personal history of other endocrine, nutritional and metabolic disease: Secondary | ICD-10-CM | POA: Insufficient documentation

## 2012-10-28 DIAGNOSIS — IMO0002 Reserved for concepts with insufficient information to code with codable children: Secondary | ICD-10-CM

## 2012-10-28 DIAGNOSIS — W19XXXA Unspecified fall, initial encounter: Secondary | ICD-10-CM

## 2012-10-28 DIAGNOSIS — Y9301 Activity, walking, marching and hiking: Secondary | ICD-10-CM | POA: Insufficient documentation

## 2012-10-28 DIAGNOSIS — Z23 Encounter for immunization: Secondary | ICD-10-CM | POA: Insufficient documentation

## 2012-10-28 DIAGNOSIS — S0100XA Unspecified open wound of scalp, initial encounter: Secondary | ICD-10-CM | POA: Insufficient documentation

## 2012-10-28 DIAGNOSIS — M109 Gout, unspecified: Secondary | ICD-10-CM | POA: Insufficient documentation

## 2012-10-28 DIAGNOSIS — Y929 Unspecified place or not applicable: Secondary | ICD-10-CM | POA: Insufficient documentation

## 2012-10-28 MED ORDER — HYDROCODONE-ACETAMINOPHEN 5-325 MG PO TABS: 1.0000 | ORAL_TABLET | Freq: Three times a day (TID) | ORAL | Status: DC | PRN

## 2012-10-28 MED ORDER — TETANUS-DIPHTH-ACELL PERTUSSIS 5-2.5-18.5 LF-MCG/0.5 IM SUSP
0.5000 mL | Freq: Once | INTRAMUSCULAR | Status: AC
  Administered 2012-10-29: 0.5 mL via INTRAMUSCULAR
  Filled 2012-10-28: qty 0.5

## 2012-10-28 NOTE — ED Notes (Signed)
Presents with 2-3 inch laceration to occiput of scalp, occurred after tripping on shoe string, hit head on steel garage frame. Denies Loss of consciousness, denies use of blood thinners. Pt is alert, oriented and answers all questions appropriately, PERRLA. C/o head and neck pain. Laceration covered with kerlix. C-collar applied. Accident occurred at 17:30. Denies nausea, dizziness at this time.

## 2012-10-28 NOTE — ED Notes (Signed)
Pt inquired as to wait.  Process was explained to pt, and pt stated he had no further questions

## 2012-10-28 NOTE — ED Provider Notes (Signed)
History   This chart was scribed for non-physician practitioner working with Brad Cooper III, MD by Brad Mcintosh, ED Scribe. This patient was seen in room TR06C/TR06C and the patient's care was started at 2252.   CSN: [id number]  Arrival date & time 10/28/12  1907   First MD Initiated Contact with Patient 10/28/12 2252      Chief Complaint  Patient presents with  . Fall    (Consider location/radiation/quality/duration/timing/severity/associated sxs/prior treatment) The history is provided by the patient, medical records and a relative.    Brad Mcintosh is a 78 y.o. male who presents to the Emergency Department complaining of a constant, sudden onset, moderate head injury that began when he fell while walking and tripped on his shoestring and hit his on a steel garage frame. He reports that after the fall he initially had neck pain, but the issue has since resolved. He denies any nausea or dizziness. He reports that he is a current dialysis pt. He denies any allergies to medications and is not currently taking a blood thinner.  Pt does not know when his last tetanus shot was. Pt denies LOC.    PCP is Dr. Algie Mcintosh.   Past Medical History  Diagnosis Date  . Hypertension   . Hyperlipidemia   . Cancer     prostate  . Gout   . Anemia     Iron deficiency  . Thyroid disease   . Malnutrition   . Atrial fibrillation     paroxysmal  . Chronic kidney disease     Pt has HD on TUESDAY, THURSDAY and SATURDAY  at [facility]    Past Surgical History  Procedure Date  . Av fistula placement 06/2006    left forearm  . Amputation     right finger  . Incise and drain abcess     chronic complex infection right long finger  . Insertion of dialysis catheter 05/30/2010    right internal jugular Palindrome catheter  . Aortogram 06/01/11    w/ runoff    Family History  Problem Relation Age of Onset  . Hypertension Mother   . Asthma Father   . Cancer Sister     breast  . Kidney disease  Brother   . Diabetes Brother     History  Substance Use Topics  . Smoking status: Former Smoker -- 0.5 packs/day    Types: Cigarettes    Quit date: 02/17/1972  . Smokeless tobacco: Not on file  . Alcohol Use: No      Review of Systems  Constitutional: Negative for fever, diaphoresis, appetite change, fatigue and unexpected weight change.  HENT: Negative for mouth sores and neck stiffness.   Eyes: Negative for visual disturbance.  Respiratory: Negative for cough, chest tightness, shortness of breath and wheezing.   Cardiovascular: Negative for chest pain.  Gastrointestinal: Negative for nausea, vomiting, abdominal pain, diarrhea and constipation.  Genitourinary: Negative for dysuria, urgency, frequency and hematuria.  Musculoskeletal: Negative for back pain.  Skin: Positive for wound. Negative for rash.  Neurological: Negative for syncope, light-headedness and headaches.  Hematological: Does not bruise/bleed easily.  Psychiatric/Behavioral: Negative for sleep disturbance. The patient is not nervous/anxious.   All other systems reviewed and are negative.    Allergies  Review of patient's allergies indicates no known allergies.  Home Medications   Current Outpatient Rx  Name  Route  Sig  Dispense  Refill  . ASPIRIN 81 MG PO TABS   Oral   Take  81 mg by mouth daily.           . AZITHROMYCIN 250 MG PO TABS   Oral   Take 1 tablet (250 mg total) by mouth daily.   4 tablet   0   . CALCIUM ACETATE 667 MG PO CAPS               . CITALOPRAM HYDROBROMIDE 20 MG PO TABS   Oral   Take 20 mg by mouth at bedtime as needed. Depression         . COLCHICINE 0.6 MG PO TABS   Oral   Take 0.6 mg by mouth as needed.           Marland Kitchen DILTIAZEM HCL ER BEADS 180 MG PO CP24   Oral   Take 180 mg by mouth 2 (two) times daily.           Marland Kitchen GABAPENTIN 100 MG PO CAPS      100 mg 3 (three) times daily.          Marland Kitchen HYDROCODONE-ACETAMINOPHEN 5-325 MG PO TABS   Oral   Take 1  tablet by mouth at bedtime as needed. pain         . HYDROCODONE-ACETAMINOPHEN 5-325 MG PO TABS   Oral   Take 1 tablet by mouth every 8 (eight) hours as needed for pain.   3 tablet   0   . METOPROLOL SUCCINATE ER 100 MG PO TB24   Oral   Take 100 mg by mouth daily.           Marland Kitchen PANTOPRAZOLE SODIUM 40 MG PO TBEC   Oral   Take 40 mg by mouth daily.           Marland Kitchen SIMVASTATIN 10 MG PO TABS   Oral   Take 10 mg by mouth at bedtime.           [name] 1 % EX CREA   Topical   Apply 1 application topically as needed.            BP 142/89  Pulse 78  Temp 98 F (36.7 C) (Oral)  Resp 20  SpO2 96%  Physical Exam  Nursing note and vitals reviewed. Constitutional: He is oriented to person, place, and time. He appears well-developed and well-nourished. No distress.  HENT:  Head: Normocephalic and atraumatic.    Right Ear: External ear normal.  Left Ear: External ear normal.  Nose: Nose normal.  Mouth/Throat: Oropharynx is clear and moist. No oropharyngeal exudate.  Eyes: Conjunctivae normal and EOM are normal. Pupils are equal, round, and reactive to light. Right eye exhibits no discharge. Left eye exhibits no discharge. No scleral icterus.  Neck: Normal range of motion. Neck supple. Muscular tenderness present. No spinous process tenderness present. Normal range of motion present.       He has bilateral paraspinal tenderness, but no spinous processes   Cardiovascular: Normal rate, regular rhythm, normal heart sounds and intact distal pulses.  Exam reveals no gallop and no friction rub.   No murmur heard. Pulmonary/Chest: Effort normal and breath sounds normal. No respiratory distress. He has no wheezes. He has no rales. He exhibits no tenderness.  Abdominal: Soft. Bowel sounds are normal. He exhibits no distension and no mass. There is no tenderness. There is no rebound and no guarding.  Musculoskeletal: Normal range of motion. He exhibits no edema.       No spinous  process tenderness or paraspinal tenderness  of the t-spine or l-spine   Lymphadenopathy:    He has no cervical adenopathy.  Neurological: He is alert and oriented to person, place, and time. He exhibits normal muscle tone. Coordination normal.       Speech is clear and goal oriented Moves extremities without ataxia  Skin: Skin is warm and dry. Laceration noted. No rash noted. He is not diaphoretic.       He has a 10 cm linear laceration to occiput.      Psychiatric: He has a normal mood and affect.    ED Course  Procedures (including critical care time)  DIAGNOSTIC STUDIES: Oxygen Saturation is 96% on room air, adequate by my interpretation.    COORDINATION OF CARE:  23:10- Discussed planned course of treatment with the patient, including repairing the laceration with staples and pain medication, who is agreeable at this time.  23:19- LACERATION REPAIR Performed by: Dahlia Client Zaylee Cornia Consent: Verbal consent obtained. Risks and benefits: risks, benefits and alternatives were discussed Patient identity confirmed: provided demographic data Time out performed prior to procedure Prepped and Draped in normal sterile fashion Wound explored Laceration Location: ociput Laceration Length: 10 cm No Foreign Bodies seen or palpated Anesthesia: local infiltration Local anesthetic: none Anesthetic total:  Irrigation method: syringe Amount of cleaning: standard Skin closure: staples Number of sutures or staples: 8 Technique: staples  Patient tolerance: Patient tolerated the procedure well with no immediate complications.    Labs Reviewed - No data to display Ct Head Wo Contrast  10/28/2012  *RADIOLOGY REPORT*  Clinical Data:  78 year old male with head and neck injury, headache and neck pain.  CT HEAD WITHOUT CONTRAST CT CERVICAL SPINE WITHOUT CONTRAST  Technique:  Multidetector CT imaging of the head and cervical spine was performed following the standard protocol without  intravenous contrast.  Multiplanar CT image reconstructions of the cervical spine were also generated.  Comparison:  03/16/2012 cervical spine MRI and 12/09/2009 head CT.  CT HEAD  Findings: Atrophy and chronic small vessel white matter ischemic changes are again noted. A right thalamic lacunar infarct is age indeterminate, but new since 12/09/2009. No acute intracranial abnormalities are identified, including mass lesion or mass effect, hydrocephalus, extra-axial fluid collection, midline shift, hemorrhage, or acute infarction.  The visualized bony calvarium is unremarkable.  IMPRESSION: No evidence of acute intracranial abnormality.  Age indeterminate right thalamic infarct - likely subacute to remote.  Atrophy and chronic small vessel white matter ischemic changes.  CT CERVICAL SPINE  Findings: Mild cervical kyphosis is again noted. 4 mm anterolisthesis of C7 on T1 is unchanged. Severe multilevel degenerative disc disease with mild to moderate spondylosis and facet arthropathy again noted contributing to multilevel mild central spinal and moderate biforaminal narrowing.  There is no evidence of acute fracture, acute subluxation or prevertebral soft tissue swelling. The left thyroid gland is prominent  IMPRESSION: No static evidence of acute injury to the cervical spine.  Multilevel degenerative changes again noted.   Original Report Authenticated By: Harmon Pier, M.D.    Ct Cervical Spine Wo Contrast  10/28/2012  *RADIOLOGY REPORT*  Clinical Data:  78 year old male with head and neck injury, headache and neck pain.  CT HEAD WITHOUT CONTRAST CT CERVICAL SPINE WITHOUT CONTRAST  Technique:  Multidetector CT imaging of the head and cervical spine was performed following the standard protocol without intravenous contrast.  Multiplanar CT image reconstructions of the cervical spine were also generated.  Comparison:  03/16/2012 cervical spine MRI and 12/09/2009 head CT.  CT HEAD  Findings: Atrophy and chronic small  vessel white matter ischemic changes are again noted. A right thalamic lacunar infarct is age indeterminate, but new since 12/09/2009. No acute intracranial abnormalities are identified, including mass lesion or mass effect, hydrocephalus, extra-axial fluid collection, midline shift, hemorrhage, or acute infarction.  The visualized bony calvarium is unremarkable.  IMPRESSION: No evidence of acute intracranial abnormality.  Age indeterminate right thalamic infarct - likely subacute to remote.  Atrophy and chronic small vessel white matter ischemic changes.  CT CERVICAL SPINE  Findings: Mild cervical kyphosis is again noted. 4 mm anterolisthesis of C7 on T1 is unchanged. Severe multilevel degenerative disc disease with mild to moderate spondylosis and facet arthropathy again noted contributing to multilevel mild central spinal and moderate biforaminal narrowing.  There is no evidence of acute fracture, acute subluxation or prevertebral soft tissue swelling. The left thyroid gland is prominent  IMPRESSION: No static evidence of acute injury to the cervical spine.  Multilevel degenerative changes again noted.   Original Report Authenticated By: Harmon Pier, M.D.      1. Fall   2. Laceration       MDM  Doyle Askew this emergency department after fall.  Patient alert and oriented, NAD, nontoxic, nonseptic appearing.  Patient without loss of consciousness.  CT head without evidence of intracranial abnormality, CT cervical spine without evidence of acute injury though with multilevel degenerative changes.  Tdap booster given.Pressure irrigation performed. Laceration occurred < 8 hours prior to repair which was well tolerated. Pt has no co morbidities to effect normal wound healing. Discussed suture home care w pt and answered questions. Pt to f-u for wound check and suture removal in 7 days. Pt is hemodynamically stable w no complaints prior to dc.    1. Medications: vicodin, usual home medications 2.  Treatment: rest, drink plenty of fluids, take Tylenol for pain,  take Vicodin only for severe breakthrough pain - I discussed with you and your son is concerned that Vicodin increases you risk for fall; please use carefully. 3. Follow Up: Please followup with your primary doctor for discussion of your diagnoses and further evaluation after today's visit; if you do not have a primary care doctor use the resource guide provided to find one;  I personally performed the services described in this documentation, which was scribed in my presence. The recorded information has been reviewed and is accurate.    Dahlia Client Elizabella Nolet, PA-C 10/28/12 [phone]

## 2012-10-28 NOTE — ED Notes (Signed)
Pt returned to waiting room from Radiology

## 2012-10-29 NOTE — ED Provider Notes (Signed)
Medical screening examination/treatment/procedure(s) were performed by non-physician practitioner and as supervising physician I was immediately available for consultation/collaboration.   [name], MD 10/29/12 [phone]

## 2012-11-16 ENCOUNTER — Other Ambulatory Visit: Payer: Self-pay | Admitting: Cardiovascular Disease

## 2012-11-16 ENCOUNTER — Ambulatory Visit
Admission: RE | Admit: 2012-11-16 | Discharge: 2012-11-16 | Disposition: A | Discharge status: Home / Self Care | Payer: Medicare Other | Source: Ambulatory Visit | Attending: Cardiovascular Disease | Admitting: Cardiovascular Disease

## 2012-11-16 DIAGNOSIS — R05 Cough: Secondary | ICD-10-CM

## 2012-11-22 ENCOUNTER — Other Ambulatory Visit: Payer: Self-pay | Admitting: *Deleted

## 2012-11-22 DIAGNOSIS — T82598A Other mechanical complication of other cardiac and vascular devices and implants, initial encounter: Secondary | ICD-10-CM

## 2012-12-01 ENCOUNTER — Encounter: Payer: Self-pay | Admitting: Vascular Surgery

## 2012-12-02 ENCOUNTER — Encounter (INDEPENDENT_AMBULATORY_CARE_PROVIDER_SITE_OTHER): Payer: Medicare Other | Admitting: Vascular Surgery

## 2012-12-02 ENCOUNTER — Ambulatory Visit (INDEPENDENT_AMBULATORY_CARE_PROVIDER_SITE_OTHER): Payer: Medicare Other | Admitting: Vascular Surgery

## 2012-12-02 ENCOUNTER — Encounter: Payer: Self-pay | Admitting: Vascular Surgery

## 2012-12-02 ENCOUNTER — Encounter: Payer: Self-pay | Admitting: *Deleted

## 2012-12-02 VITALS — BP 129/87 | HR 78 | Ht 71.5 in | Wt 194.0 lb

## 2012-12-02 DIAGNOSIS — N186 End stage renal disease: Secondary | ICD-10-CM | POA: Insufficient documentation

## 2012-12-02 DIAGNOSIS — T82598A Other mechanical complication of other cardiac and vascular devices and implants, initial encounter: Secondary | ICD-10-CM

## 2012-12-02 NOTE — Progress Notes (Signed)
[facility] [place]  Established Dialysis Access  History of Present Illness  Brad Mcintosh is a 78 y.o. (11-28-1934) male who presents for re-evaluation of LUA AVG.  By report, the patient is having poor flow rates in this access on periodical flow rate studies.  Patient denies any steal symptoms from this graft.  He denies any bleeding complication.  He does not remember any prior shuntograms on this access.  Past Medical History  Diagnosis Date  . Hypertension   . Hyperlipidemia   . Cancer     prostate  . Gout   . Anemia     Iron deficiency  . Thyroid disease   . Malnutrition   . Atrial fibrillation     paroxysmal  . Chronic kidney disease     Pt has HD on TUESDAY, THURSDAY and SATURDAY  at [facility]    Past Surgical History  Procedure Laterality Date  . Av fistula placement  06/2006    left forearm  . Amputation      right finger  . Incise and drain abcess      chronic complex infection right long finger  . Insertion of dialysis catheter  05/30/2010    right internal jugular Palindrome catheter  . Aortogram  06/01/11    w/ runoff    History   Social History  . Marital Status: Widowed    Spouse Name: N/A    Number of Children: N/A  . Years of Education: N/A   Occupational History  . Not on file.   Social History Main Topics  . Smoking status: Former Smoker -- 0.50 packs/day    Types: Cigarettes    Quit date: 02/17/1972  . Smokeless tobacco: Not on file  . Alcohol Use: No  . Drug Use: No  . Sexually Active: Not on file   Other Topics Concern  . Not on file   Social History Narrative  . No narrative on file    Family History  Problem Relation Age of Onset  . Hypertension Mother   . Asthma Father   . Cancer Sister     breast  . Kidney disease Brother   . Diabetes Brother     Current Outpatient Prescriptions on File Prior to Visit  Medication Sig Dispense Refill  . aspirin 81 MG tablet Take 81 mg by mouth daily.         Marland Kitchen azithromycin (ZITHROMAX) 250 MG tablet Take 1 tablet (250 mg total) by mouth daily.  4 tablet  0  . calcium acetate (PHOSLO) 667 MG capsule       . citalopram (CELEXA) 20 MG tablet Take 20 mg by mouth at bedtime as needed. Depression      . colchicine 0.6 MG tablet Take 0.6 mg by mouth as needed.        . diltiazem (TIAZAC) 180 MG 24 hr capsule Take 180 mg by mouth 2 (two) times daily.        Marland Kitchen gabapentin (NEURONTIN) 100 MG capsule 100 mg 3 (three) times daily.       Marland Kitchen HYDROcodone-acetaminophen (NORCO) 5-325 MG per tablet Take 1 tablet by mouth at bedtime as needed. pain      . HYDROcodone-acetaminophen (NORCO/VICODIN) 5-325 MG per tablet Take 1 tablet by mouth every 8 (eight) hours as needed for pain.  3 tablet  0  . metoprolol (TOPROL-XL) 100 MG 24 hr tablet Take 100 mg by mouth daily.        Marland Kitchen  pantoprazole (PROTONIX) 40 MG tablet Take 40 mg by mouth daily.        . simvastatin (ZOCOR) 10 MG tablet Take 10 mg by mouth at bedtime.        [name] 1 % cream Apply 1 application topically as needed.        No current facility-administered medications on file prior to visit.    No Known Allergies  Review of Systems (Positive items checked otherwise negative)  General: [ ]  Weight loss, [ ]  Weight gain, [ ]   Loss of appetite, [ ]  Fever  Neurologic: [ ]  Dizziness, [ ]  Blackouts, [ ]  Headaches, [ ]  Seizure  Ear/Nose/Throat: [ ]  Change in eyesight, [ ]  Change in hearing, [ ]  Nose bleeds, [ ]  Sore throat  Vascular: [ ]  Pain in legs with walking, [ ]  Pain in feet while lying flat, [ ]  Non-healing ulcer, Stroke, [ ]  "Mini stroke", [ ]  Slurred speech, [ ]  Temporary blindness, [ ]  Blood clot in vein, [ ]  Phlebitis  Pulmonary: [ ]  Home oxygen, [ ]  Productive cough, [ ]  Bronchitis, [ ]  Coughing up blood,  [ ]  Asthma, [ ]  Wheezing  Musculoskeletal: [ ]  Arthritis, [ ]  Joint pain, [ ]  Muscle pain  Cardiac: [ ]  Chest pain, [ ]  Chest tightness/pressure, [ ]  Shortness of breath when lying flat, [  ] Shortness of breath with exertion, [ ]  Palpitations, [ ]  Heart murmur, [ ]  Arrythmia,  [ ]  Atrial fibrillation  Hematologic: [ ]  Bleeding problems, [ ]  Clotting disorder, [ ]  Anemia  Psychiatric:  [ ]  Depression, [ ]  Anxiety, [ ]  Attention deficit disorder  Gastrointestinal:  [ ]  Black stool,[ ]   Blood in stool, [ ]  Peptic ulcer disease, [ ]  Reflux, [ ]  Hiatal hernia, [ ]  Trouble swallowing, [ ]  Diarrhea, [ ]  Constipation  Urinary:  [x]  Kidney disease, [ ]  Burning with urination, [ ]  Frequent urination, [ ]  Difficulty urinating  Skin: [ ]  Ulcers, [ ]  Rashes     Physical Examination  Filed Vitals:   12/02/12 1410  BP: 129/87  Pulse: 78  Height: 5' 11.5" (1.816 m)  Weight: 194 lb (87.998 kg)  SpO2: 97%   Body mass index is 26.68 kg/(m^2).  General: A&O x 3, WDWN  Pulmonary: Sym exp, good air movt, CTAB, no rales, rhonchi, & wheezing  Cardiac: RRR, Nl S1, S2, no Murmurs, rubs or gallops  Gastrointestinal: soft, NTND, -G/R, - HSM, - masses, - CVAT B  Musculoskeletal: M/S 5/5 throughout , Extremities without  ischemic changes , forearm loop AVG without any flow, LUA AVG with poor thrill distally, good thrill proximally, +bruit  Neurologic: Pain and light touch intact in extremities , Motor exam as listed above  Non-Invasive Vascular Imaging  L arm access duplex (Date: 12/02/12):   PSA in distal 1/3: 1.3 cm x 2.7 cm  Venous anastomotic stenosis: 730 c/s  Outside Studies/Documentation 3 pages of outside documents were reviewed including: outpatient nephrology chart.  Medical Decision Making  [name] is a 78 y.o. male who presents with ESRD requiring hemodialysis with venous outflow stenosis  I recommend L arm shuntogram with possible intervention on 12/12/12. I discussed with the patient the nature of angiographic procedures, especially the limited patencies of any endovascular intervention.   The patient is aware of that the risks of an angiographic procedure  include but are not limited to: bleeding, infection, access site complications, renal failure, embolization, rupture of vessel, dissection, possible need for  emergent surgical intervention, possible need for surgical procedures to treat the patient's pathology, and stroke and death.   The patient is aware of the risks and agrees to proceed.  Leonides Sake, MD [facility] [place] Office: [phone] Pager: [phone]  12/02/2012, 2:38 PM

## 2012-12-05 ENCOUNTER — Other Ambulatory Visit: Payer: Self-pay | Admitting: *Deleted

## 2012-12-12 ENCOUNTER — Telehealth: Payer: Self-pay | Admitting: Vascular Surgery

## 2012-12-12 ENCOUNTER — Encounter (HOSPITAL_COMMUNITY)
Admission: RE | Disposition: A | Discharge status: Home / Self Care | Payer: Self-pay | Source: Ambulatory Visit | Attending: Vascular Surgery

## 2012-12-12 ENCOUNTER — Ambulatory Visit (HOSPITAL_COMMUNITY)
Admission: RE | Admit: 2012-12-12 | Discharge: 2012-12-12 | Disposition: A | Discharge status: Home / Self Care | Payer: Medicare Other | Source: Ambulatory Visit | Attending: Vascular Surgery | Admitting: Vascular Surgery

## 2012-12-12 ENCOUNTER — Encounter (HOSPITAL_COMMUNITY): Payer: Self-pay | Admitting: Pharmacy Technician

## 2012-12-12 DIAGNOSIS — Y849 Medical procedure, unspecified as the cause of abnormal reaction of the patient, or of later complication, without mention of misadventure at the time of the procedure: Secondary | ICD-10-CM | POA: Insufficient documentation

## 2012-12-12 DIAGNOSIS — T82898A Other specified complication of vascular prosthetic devices, implants and grafts, initial encounter: Secondary | ICD-10-CM | POA: Insufficient documentation

## 2012-12-12 DIAGNOSIS — E785 Hyperlipidemia, unspecified: Secondary | ICD-10-CM | POA: Insufficient documentation

## 2012-12-12 DIAGNOSIS — Z992 Dependence on renal dialysis: Secondary | ICD-10-CM | POA: Insufficient documentation

## 2012-12-12 DIAGNOSIS — I10 Essential (primary) hypertension: Secondary | ICD-10-CM | POA: Insufficient documentation

## 2012-12-12 DIAGNOSIS — I129 Hypertensive chronic kidney disease with stage 1 through stage 4 chronic kidney disease, or unspecified chronic kidney disease: Secondary | ICD-10-CM | POA: Insufficient documentation

## 2012-12-12 DIAGNOSIS — N189 Chronic kidney disease, unspecified: Secondary | ICD-10-CM | POA: Insufficient documentation

## 2012-12-12 HISTORY — PX: SHUNTOGRAM: SHX5491

## 2012-12-12 LAB — POCT I-STAT, CHEM 8
Calcium, Ion: 1.13 mmol/L (ref 1.13–1.30)
Creatinine, Ser: 10.4 mg/dL — ABNORMAL HIGH (ref 0.50–1.35)
Glucose, Bld: 94 mg/dL (ref 70–99)
Hemoglobin: 13.6 g/dL (ref 13.0–17.0)
Potassium: 4.2 mEq/L (ref 3.5–5.1)
TCO2: 29 mmol/L (ref 0–100)

## 2012-12-12 SURGERY — ASSESSMENT, SHUNT FUNCTION, WITH CONTRAST RADIOGRAPHIC STUDY
Anesthesia: LOCAL

## 2012-12-12 MED ORDER — LIDOCAINE HCL (PF) 1 % IJ SOLN
INTRAMUSCULAR | Status: AC
  Filled 2012-12-12: qty 30

## 2012-12-12 MED ORDER — HEPARIN (PORCINE) IN NACL 2-0.9 UNIT/ML-% IJ SOLN
INTRAMUSCULAR | Status: AC
  Filled 2012-12-12: qty 500

## 2012-12-12 MED ORDER — SODIUM CHLORIDE 0.9 % IJ SOLN: 3.0000 mL | INTRAMUSCULAR | Status: DC | PRN

## 2012-12-12 MED ORDER — HEPARIN SODIUM (PORCINE) 1000 UNIT/ML IJ SOLN
INTRAMUSCULAR | Status: AC
  Filled 2012-12-12: qty 1

## 2012-12-12 NOTE — Interval H&P Note (Signed)
History and Physical Interval Note:  12/12/2012 10:23 AM  Brad Mcintosh  has presented today for surgery, with the diagnosis of instage renal  The various methods of treatment have been discussed with the patient and family. After consideration of risks, benefits and other options for treatment, the patient has consented to  Procedure(s): SHUNTOGRAM (N/A) as a surgical intervention .  The patient's history has been reviewed, patient examined, no change in status, stable for surgery.  I have reviewed the patient's chart and labs.  Questions were answered to the patient's satisfaction.     CHEN,BRIAN LIANG-YU

## 2012-12-12 NOTE — H&P (View-Only) (Signed)
[facility] [place]  Established Dialysis Access  History of Present Illness  Brad Mcintosh is a 77 y.o. (09/19/1935) male who presents for re-evaluation of LUA AVG.  By report, the patient is having poor flow rates in this access on periodical flow rate studies.  Patient denies any steal symptoms from this graft.  He denies any bleeding complication.  He does not remember any prior shuntograms on this access.  Past Medical History  Diagnosis Date  . Hypertension   . Hyperlipidemia   . Cancer     prostate  . Gout   . Anemia     Iron deficiency  . Thyroid disease   . Malnutrition   . Atrial fibrillation     paroxysmal  . Chronic kidney disease     Pt has HD on TUESDAY, THURSDAY and SATURDAY  at [facility]    Past Surgical History  Procedure Laterality Date  . Av fistula placement  06/2006    left forearm  . Amputation      right finger  . Incise and drain abcess      chronic complex infection right long finger  . Insertion of dialysis catheter  05/30/2010    right internal jugular Palindrome catheter  . Aortogram  06/01/11    w/ runoff    History   Social History  . Marital Status: Widowed    Spouse Name: N/A    Number of Children: N/A  . Years of Education: N/A   Occupational History  . Not on file.   Social History Main Topics  . Smoking status: Former Smoker -- 0.50 packs/day    Types: Cigarettes    Quit date: 02/17/1972  . Smokeless tobacco: Not on file  . Alcohol Use: No  . Drug Use: No  . Sexually Active: Not on file   Other Topics Concern  . Not on file   Social History Narrative  . No narrative on file    Family History  Problem Relation Age of Onset  . Hypertension Mother   . Asthma Father   . Cancer Sister     breast  . Kidney disease Brother   . Diabetes Brother     Current Outpatient Prescriptions on File Prior to Visit  Medication Sig Dispense Refill  . aspirin 81 MG tablet Take 81 mg by mouth daily.         . azithromycin (ZITHROMAX) 250 MG tablet Take 1 tablet (250 mg total) by mouth daily.  4 tablet  0  . calcium acetate (PHOSLO) 667 MG capsule       . citalopram (CELEXA) 20 MG tablet Take 20 mg by mouth at bedtime as needed. Depression      . colchicine 0.6 MG tablet Take 0.6 mg by mouth as needed.        . diltiazem (TIAZAC) 180 MG 24 hr capsule Take 180 mg by mouth 2 (two) times daily.        . gabapentin (NEURONTIN) 100 MG capsule 100 mg 3 (three) times daily.       . HYDROcodone-acetaminophen (NORCO) 5-325 MG per tablet Take 1 tablet by mouth at bedtime as needed. pain      . HYDROcodone-acetaminophen (NORCO/VICODIN) 5-325 MG per tablet Take 1 tablet by mouth every 8 (eight) hours as needed for pain.  3 tablet  0  . metoprolol (TOPROL-XL) 100 MG 24 hr tablet Take 100 mg by mouth daily.        .   pantoprazole (PROTONIX) 40 MG tablet Take 40 mg by mouth daily.        . simvastatin (ZOCOR) 10 MG tablet Take 10 mg by mouth at bedtime.        . THERMAZENE 1 % cream Apply 1 application topically as needed.        No current facility-administered medications on file prior to visit.    No Known Allergies  Review of Systems (Positive items checked otherwise negative)  General: [ ] Weight loss, [ ] Weight gain, [ ]  Loss of appetite, [ ] Fever  Neurologic: [ ] Dizziness, [ ] Blackouts, [ ] Headaches, [ ] Seizure  Ear/Nose/Throat: [ ] Change in eyesight, [ ] Change in hearing, [ ] Nose bleeds, [ ] Sore throat  Vascular: [ ] Pain in legs with walking, [ ] Pain in feet while lying flat, [ ] Non-healing ulcer, Stroke, [ ] "Mini stroke", [ ] Slurred speech, [ ] Temporary blindness, [ ] Blood clot in vein, [ ] Phlebitis  Pulmonary: [ ] Home oxygen, [ ] Productive cough, [ ] Bronchitis, [ ] Coughing up blood,  [ ] Asthma, [ ] Wheezing  Musculoskeletal: [ ] Arthritis, [ ] Joint pain, [ ] Muscle pain  Cardiac: [ ] Chest pain, [ ] Chest tightness/pressure, [ ] Shortness of breath when lying flat, [  ] Shortness of breath with exertion, [ ] Palpitations, [ ] Heart murmur, [ ] Arrythmia,  [ ] Atrial fibrillation  Hematologic: [ ] Bleeding problems, [ ] Clotting disorder, [ ] Anemia  Psychiatric:  [ ] Depression, [ ] Anxiety, [ ] Attention deficit disorder  Gastrointestinal:  [ ] Black stool,[ ]  Blood in stool, [ ] Peptic ulcer disease, [ ] Reflux, [ ] Hiatal hernia, [ ] Trouble swallowing, [ ] Diarrhea, [ ] Constipation  Urinary:  [x] Kidney disease, [ ] Burning with urination, [ ] Frequent urination, [ ] Difficulty urinating  Skin: [ ] Ulcers, [ ] Rashes     Physical Examination  Filed Vitals:   12/02/12 1410  BP: 129/87  Pulse: 78  Height: 5' 11.5" (1.816 m)  Weight: 194 lb (87.998 kg)  SpO2: 97%   Body mass index is 26.68 kg/(m^2).  General: A&O x 3, WDWN  Pulmonary: Sym exp, good air movt, CTAB, no rales, rhonchi, & wheezing  Cardiac: RRR, Nl S1, S2, no Murmurs, rubs or gallops  Gastrointestinal: soft, NTND, -G/R, - HSM, - masses, - CVAT B  Musculoskeletal: M/S 5/5 throughout , Extremities without  ischemic changes , forearm loop AVG without any flow, LUA AVG with poor thrill distally, good thrill proximally, +bruit  Neurologic: Pain and light touch intact in extremities , Motor exam as listed above  Non-Invasive Vascular Imaging  L arm access duplex (Date: 12/02/12):   PSA in distal 1/3: 1.3 cm x 2.7 cm  Venous anastomotic stenosis: 730 c/s  Outside Studies/Documentation 3 pages of outside documents were reviewed including: outpatient nephrology chart.  Medical Decision Making  Brad Mcintosh is a 77 y.o. male who presents with ESRD requiring hemodialysis with venous outflow stenosis  I recommend L arm shuntogram with possible intervention on 12/12/12. I discussed with the patient the nature of angiographic procedures, especially the limited patencies of any endovascular intervention.   The patient is aware of that the risks of an angiographic procedure  include but are not limited to: bleeding, infection, access site complications, renal failure, embolization, rupture of vessel, dissection, possible need for   emergent surgical intervention, possible need for surgical procedures to treat the patient's pathology, and stroke and death.   The patient is aware of the risks and agrees to proceed.  Brad Kasa, MD [facility]  Office: [phone] Pager: [phone]  12/02/2012, 2:38 PM   

## 2012-12-12 NOTE — Telephone Encounter (Addendum)
Message copied by Shari Prows on Mon Dec 12, 2012  2:50 PM ------      Message from: Melene Plan      Created: Mon Dec 12, 2012 11:41 AM                   ----- Message -----         From: Fransisco Hertz, MD         Sent: 12/12/2012  11:35 AM           To: Reuel Derby, Melene Plan, RN            Brad Mcintosh      [id number]      11-24-1934                        PROCEDURE:      1.  left upper arm arteriovenous graft cannulation under ultrasound guidance      2.  left arm shuntogram      3.  Venoplasty of brachial vein x 3 (6 mm x 40 mm, 8 mm x 40 mm, cutting 6 mm x 20 mm)            Follow-up: 4-6 weeks ------I scheduled an appt for the above pt on 01/20/13 at 12:45pm w/ [facility]. I mailed an appt letter and also called but was unable to lm for the pt JY:NWGN/FAO

## 2012-12-12 NOTE — Op Note (Signed)
OPERATIVE NOTE   PROCEDURE: 1.  left upper arm arteriovenous graft cannulation under ultrasound guidance 2.  left arm shuntogram 3.  Venoplasty of brachial vein x 3 (6 mm x 40 mm, 8 mm x 40 mm, cutting 6 mm x 20 mm)  PRE-OPERATIVE DIAGNOSIS: Malfunctioning left arteriovenous graft  POST-OPERATIVE DIAGNOSIS: same as above   SURGEON: Leonides Sake, MD  ANESTHESIA: local  ESTIMATED BLOOD LOSS: 5 cc  FINDING(S): 1. Severe stenosis in brachial vein ~1 cm proximal to venous anastomosis: recalcitrant to serial angioplasty, improved to ~5 mm lumen after cutting angioplasty 2. Patent central venous system 3. Patent arterial anastomosis 4. Brachial artery stenosis distal to anastomosis evident 5. Moderate sized pseudoaneurysm evident  SPECIMEN(S):  None  CONTRAST: 45 cc  INDICATIONS: Brad Mcintosh is a 78 y.o. male who presents with malfunctioning left upper arm arteriovenous graft.  The patient is scheduled for left arm shuntogram.  The patient is aware the risks include but are not limited to: bleeding, infection, thrombosis of the cannulated access, and possible anaphylactic reaction to the contrast.  The patient is aware of the risks of the procedure and elects to proceed forward.  DESCRIPTION: After full informed written consent was obtained, the patient was brought back to the angiography suite and placed supine upon the angiography table.  The patient was connected to monitoring equipment.  The left arm was prepped and draped in the standard fashion for a percutaneous access intervention.  Under ultrasound guidance, the left upper arm arteriovenous graft was cannulated with a micropuncture needle.  The microwire was advanced into the fistula and the needle was exchanged for the a microsheath, which was lodged 2 cm into the access.  The wire was removed and the sheath was connected to the IV extension tubing.  Hand injections were completed to image the access from the antecubitum up to the  level of axilla.  The central venous structures were also imaged by hand injections.  Based on the images, this patient will need: venoplasty of brachial vein.   Attempts to pass a Benson wire distal to the brachial anastomosis failed, so I exchanged the sheath for a short 6-Fr sheath.  Using a Glidewire and KMP catheter, I was able to steer into the brachial vein with some difficulty.  I then exchanged the wire for a Rosen wire.  Based on the the imaging, a 6 mm x 40 mm angioplasty balloon was selected.  The balloon was centered around the stenosis and inflated to 18 atm for 2 minutes.  On completion imaging, a >30% residual stenosis is present.  At this point, the balloon was exchanged for a 8 mm x 40 angioplasty balloon.  The balloon was centered around the stenosis and inflated to 18 atm for 2 minutes.  On completion imaging, nearly no resolution of the stenosis had occurred.  I replaced the KMP catheter and exchanged the wire for a 0.014" Spartacore wire.  An 6 mm x 20 mm Angiosculpt cutting balloon was placed over the wire and centered on the stenosis.  I was inflated to 14 atm for 2 minutes.  The balloon was deflated and removed.  There remained some stenosis but there at now a 5 mm lumen and the thrill in the graft had improved substantially and the pulsatile character had resolved greatly.  The wire was also discontinued.  Based on the completion imaging, no further intervention is necessary.  The wire and balloon were removed from the sheath.  A 4-0 Monocryl  purse-string suture was sewn around the sheath.  The sheath was removed while tying down the suture.  A sterile bandage was applied to the puncture site.  When this stenosis recurs, I recommend surgical revision rather than venoplasty, as I doubt further percutaneous intervention will have meaningful long-term patency.  COMPLICATIONS: none  CONDITION: stable  Leonides Sake, MD [facility] [place] Office:  [phone] Pager: [phone]  12/12/2012 11:26 AM

## 2013-01-16 ENCOUNTER — Other Ambulatory Visit: Payer: Self-pay | Admitting: Cardiovascular Disease

## 2013-01-16 ENCOUNTER — Ambulatory Visit
Admission: RE | Admit: 2013-01-16 | Discharge: 2013-01-16 | Disposition: A | Discharge status: Home / Self Care | Payer: Medicare Other | Source: Ambulatory Visit | Attending: Cardiovascular Disease | Admitting: Cardiovascular Disease

## 2013-01-16 DIAGNOSIS — R1110 Vomiting, unspecified: Secondary | ICD-10-CM

## 2013-01-19 ENCOUNTER — Encounter: Payer: Self-pay | Admitting: Vascular Surgery

## 2013-01-20 ENCOUNTER — Ambulatory Visit: Payer: Medicare Other | Admitting: Vascular Surgery

## 2013-01-30 ENCOUNTER — Ambulatory Visit: Payer: Self-pay | Admitting: Vascular Surgery

## 2013-05-09 ENCOUNTER — Other Ambulatory Visit: Payer: Self-pay | Admitting: Nephrology

## 2013-05-09 DIAGNOSIS — M545 Low back pain: Secondary | ICD-10-CM

## 2013-05-17 ENCOUNTER — Other Ambulatory Visit: Payer: Medicare Other

## 2013-06-02 ENCOUNTER — Ambulatory Visit (INDEPENDENT_AMBULATORY_CARE_PROVIDER_SITE_OTHER): Payer: Medicare Other | Admitting: Internal Medicine

## 2013-06-02 VITALS — BP 128/82 | HR 72 | Temp 98.0°F | Resp 17 | Ht 68.5 in | Wt 183.0 lb

## 2013-06-02 DIAGNOSIS — S39012A Strain of muscle, fascia and tendon of lower back, initial encounter: Secondary | ICD-10-CM

## 2013-06-02 DIAGNOSIS — IMO0002 Reserved for concepts with insufficient information to code with codable children: Secondary | ICD-10-CM

## 2013-06-02 MED ORDER — CYCLOBENZAPRINE HCL 10 MG PO TABS: ORAL_TABLET | ORAL | Status: DC

## 2013-06-02 NOTE — Progress Notes (Signed)
  Subjective:    Patient ID: Brad Mcintosh, male    DOB: 1935-04-17, 78 y.o.   MRN: [id number]  HPI 78 y.o. Male presents to clinic today for trouble with back pain x 1 week. Pain radiates down legs and all over back.  Noticed after turning; "heard something pop from my neck all the way down." Patient states he is having trouble urinating; patient is on dialysis every Tues, Thurs., and Sat. Patient denies any trouble having bowel movements.    Patient is taking pain pills that dialysis doctor prescribed to help with pain. Patient has Previous history of pinched nerve in neck; 19-20 years ago.  Has a MRI of the back ordered by Dr. Casimiro Needle; Patient is currently waiting to have this done.   Review of Systems     Objective:   Physical Exam  Constitutional: He is oriented to person, place, and time. He appears well-developed and well-nourished.  HENT:  Head: Normocephalic.  Eyes: EOM are normal.  Neck: Normal range of motion.  Pulmonary/Chest: Effort normal.  Musculoskeletal:       Lumbar back: He exhibits tenderness, deformity, pain and spasm. He exhibits normal range of motion, no bony tenderness, no swelling, no edema, no laceration and normal pulse.  Neurological: He is alert and oriented to person, place, and time. He has normal strength and normal reflexes. No sensory deficit. He exhibits normal muscle tone. Coordination and gait normal.  Psychiatric: He has a normal mood and affect. His behavior is normal.          Assessment & Plan:  Back strain MRI soon Dr. Lowell Guitar Back care and flexeril

## 2013-06-02 NOTE — Patient Instructions (Addendum)
Back Pain, Adult  Low back pain is very common. About 1 in 5 people have back pain.The cause of low back pain is rarely dangerous. The pain often gets better over time.About half of people with a sudden onset of back pain feel better in just 2 weeks. About 8 in 10 people feel better by 6 weeks.   CAUSES  Some common causes of back pain include:   Strain of the muscles or ligaments supporting the spine.   Wear and tear (degeneration) of the spinal discs.   Arthritis.   Direct injury to the back.  DIAGNOSIS  Most of the time, the direct cause of low back pain is not known.However, back pain can be treated effectively even when the exact cause of the pain is unknown.Answering your caregiver's questions about your overall health and symptoms is one of the most accurate ways to make sure the cause of your pain is not dangerous. If your caregiver needs more information, he or she may order lab work or imaging tests (X-rays or MRIs).However, even if imaging tests show changes in your back, this usually does not require surgery.  HOME CARE INSTRUCTIONS  For many people, back pain returns.Since low back pain is rarely dangerous, it is often a condition that people can learn to manageon their own.    Remain active. It is stressful on the back to sit or stand in one place. Do not sit, drive, or stand in one place for more than 30 minutes at a time. Take short walks on level surfaces as soon as pain allows.Try to increase the length of time you walk each day.   Do not stay in bed.Resting more than 1 or 2 days can delay your recovery.   Do not avoid exercise or work.Your body is made to move.It is not dangerous to be active, even though your back may hurt.Your back will likely heal faster if you return to being active before your pain is gone.    Pay attention to your body when you bend and lift. Many people have less discomfortwhen lifting if they bend their knees, keep the load close to their bodies,and avoid twisting. Often, the most comfortable positions are those that put less stress on your recovering back.   Find a comfortable position to sleep. Use a firm mattress and lie on your side with your knees slightly bent. If you lie on your back, put a pillow under your knees.   Only take over-the-counter or prescription medicines as directed by your caregiver. Over-the-counter medicines to reduce pain and inflammation are often the most helpful.Your caregiver may prescribe muscle relaxant drugs.These medicines help dull your pain so you can more quickly return to your normal activities and healthy exercise.   Put ice on the injured area.   Put ice in a plastic bag.   Place a towel between your skin and the bag.   Leave the ice on for 15-20 minutes, 3-4 times a day for the first 2 to 3 days. After that, ice and heat may be alternated to reduce pain and spasms.   Ask your caregiver about trying back exercises and gentle massage. This may be of some benefit.   Avoid feeling anxious or stressed.Stress increases muscle tension and can worsen back pain.It is important to recognize when you are anxious or stressed and learn ways to manage it.Exercise is a great option.  SEEK MEDICAL CARE IF:   You have pain that is not relieved with rest or   medicine.   You have pain that does not improve in 1 week.   You have new symptoms.   You are generally not feeling well.  SEEK IMMEDIATE MEDICAL CARE IF:    You have pain that radiates from your back into your legs.   You develop new bowel or bladder control problems.   You have unusual weakness or numbness in your arms or legs.   You develop nausea or vomiting.   You develop abdominal pain.   You feel faint.  Document Released: 09/28/2005 Document Revised: 03/29/2012 Document Reviewed: 02/16/2011   [employer] Patient Information 2014 [employer]  Back Pain, Adult  Low back pain is very common. About 1 in 5 people have back pain.The cause of low back pain is rarely dangerous. The pain often gets better over time.About half of people with a sudden onset of back pain feel better in just 2 weeks. About 8 in 10 people feel better by 6 weeks.   CAUSES  Some common causes of back pain include:   Strain of the muscles or ligaments supporting the spine.   Wear and tear (degeneration) of the spinal discs.   Arthritis.   Direct injury to the back.  DIAGNOSIS  Most of the time, the direct cause of low back pain is not known.However, back pain can be treated effectively even when the exact cause of the pain is unknown.Answering your caregiver's questions about your overall health and symptoms is one of the most accurate ways to make sure the cause of your pain is not dangerous. If your caregiver needs more information, he or she may order lab work or imaging tests (X-rays or MRIs).However, even if imaging tests show changes in your back, this usually does not require surgery.  HOME CARE INSTRUCTIONS  For many people, back pain returns.Since low back pain is rarely dangerous, it is often a condition that people can learn to manageon their own.    Remain active. It is stressful on the back to sit or stand in one place. Do not sit, drive, or stand in one place for more than 30 minutes at a time. Take short walks on level surfaces as soon as pain allows.Try to increase the length of time you walk each day.   Do not stay in bed.Resting more than 1 or 2 days can delay your recovery.   Do not avoid exercise or work.Your body is made to move.It is not dangerous to be active, even though your back may hurt.Your back will likely heal faster if you return to being active before your pain is gone.    Pay attention to your body when you bend and lift. Many people have less discomfortwhen lifting if they bend their knees, keep the load close to their bodies,and avoid twisting. Often, the most comfortable positions are those that put less stress on your recovering back.   Find a comfortable position to sleep. Use a firm mattress and lie on your side with your knees slightly bent. If you lie on your back, put a pillow under your knees.   Only take over-the-counter or prescription medicines as directed by your caregiver. Over-the-counter medicines to reduce pain and inflammation are often the most helpful.Your caregiver may prescribe muscle relaxant drugs.These medicines help dull your pain so you can more quickly return to your normal activities and healthy exercise.   Put ice on the injured area.   Put ice in a plastic bag.   Place a towel between your   skin and the bag.   Leave the ice on for 15-20 minutes, 3-4 times a day for the first 2 to 3 days. After that, ice and heat may be alternated to reduce pain and spasms.   Ask your caregiver about trying back exercises and gentle massage. This may be of some benefit.   Avoid feeling anxious or stressed.Stress increases muscle tension and can worsen back pain.It is important to recognize when you are anxious or stressed and learn ways to manage it.Exercise is a great option.  SEEK MEDICAL CARE IF:   You have pain that is not relieved with rest or medicine.   You have pain that does not improve in 1 week.   You have new symptoms.   You are generally not feeling well.  SEEK IMMEDIATE MEDICAL CARE IF:    You have pain that radiates from your back into your legs.   You develop new bowel or bladder control problems.   You have unusual weakness or numbness in your arms or legs.   You develop nausea or vomiting.   You develop abdominal pain.   You feel faint.  Document Released: 09/28/2005 Document Revised: 03/29/2012 Document Reviewed: 02/16/2011   [employer] Patient Information 2014 [employer]

## 2013-07-01 ENCOUNTER — Encounter (HOSPITAL_COMMUNITY): Payer: Self-pay | Admitting: Emergency Medicine

## 2013-07-01 ENCOUNTER — Emergency Department (HOSPITAL_COMMUNITY)
Admission: EM | Admit: 2013-07-01 | Discharge: 2013-07-01 | Disposition: A | Discharge status: Home / Self Care | Payer: Medicare Other | Attending: Emergency Medicine | Admitting: Emergency Medicine

## 2013-07-01 ENCOUNTER — Emergency Department (HOSPITAL_COMMUNITY): Payer: Medicare Other

## 2013-07-01 DIAGNOSIS — Z8679 Personal history of other diseases of the circulatory system: Secondary | ICD-10-CM | POA: Insufficient documentation

## 2013-07-01 DIAGNOSIS — Z992 Dependence on renal dialysis: Secondary | ICD-10-CM | POA: Insufficient documentation

## 2013-07-01 DIAGNOSIS — N189 Chronic kidney disease, unspecified: Secondary | ICD-10-CM | POA: Insufficient documentation

## 2013-07-01 DIAGNOSIS — E785 Hyperlipidemia, unspecified: Secondary | ICD-10-CM | POA: Insufficient documentation

## 2013-07-01 DIAGNOSIS — Z79899 Other long term (current) drug therapy: Secondary | ICD-10-CM | POA: Insufficient documentation

## 2013-07-01 DIAGNOSIS — R197 Diarrhea, unspecified: Secondary | ICD-10-CM | POA: Insufficient documentation

## 2013-07-01 DIAGNOSIS — E079 Disorder of thyroid, unspecified: Secondary | ICD-10-CM | POA: Insufficient documentation

## 2013-07-01 DIAGNOSIS — Z7982 Long term (current) use of aspirin: Secondary | ICD-10-CM | POA: Insufficient documentation

## 2013-07-01 DIAGNOSIS — I120 Hypertensive chronic kidney disease with stage 5 chronic kidney disease or end stage renal disease: Secondary | ICD-10-CM | POA: Insufficient documentation

## 2013-07-01 DIAGNOSIS — Z87891 Personal history of nicotine dependence: Secondary | ICD-10-CM | POA: Insufficient documentation

## 2013-07-01 DIAGNOSIS — Z8639 Personal history of other endocrine, nutritional and metabolic disease: Secondary | ICD-10-CM | POA: Insufficient documentation

## 2013-07-01 DIAGNOSIS — R6883 Chills (without fever): Secondary | ICD-10-CM | POA: Insufficient documentation

## 2013-07-01 DIAGNOSIS — D649 Anemia, unspecified: Secondary | ICD-10-CM | POA: Insufficient documentation

## 2013-07-01 DIAGNOSIS — Z862 Personal history of diseases of the blood and blood-forming organs and certain disorders involving the immune mechanism: Secondary | ICD-10-CM | POA: Insufficient documentation

## 2013-07-01 DIAGNOSIS — R112 Nausea with vomiting, unspecified: Secondary | ICD-10-CM

## 2013-07-01 DIAGNOSIS — D631 Anemia in chronic kidney disease: Secondary | ICD-10-CM

## 2013-07-01 DIAGNOSIS — Z8546 Personal history of malignant neoplasm of prostate: Secondary | ICD-10-CM | POA: Insufficient documentation

## 2013-07-01 LAB — CBC WITH DIFFERENTIAL/PLATELET
Basophils Absolute: 0 K/uL (ref 0.0–0.1)
Basophils Relative: 0 % (ref 0–1)
Eosinophils Absolute: 0.1 10*3/uL (ref 0.0–0.7)
Eosinophils Relative: 2 % (ref 0–5)
HCT: 29.4 % — ABNORMAL LOW (ref 39.0–52.0)
Hemoglobin: 9.5 g/dL — ABNORMAL LOW (ref 13.0–17.0)
Lymphocytes Relative: 16 % (ref 12–46)
Lymphs Abs: 0.8 K/uL (ref 0.7–4.0)
MCH: 28.8 pg (ref 26.0–34.0)
MCHC: 32.3 g/dL (ref 30.0–36.0)
MCV: 89.1 fL (ref 78.0–100.0)
Monocytes Absolute: 0.6 K/uL (ref 0.1–1.0)
Monocytes Relative: 13 % — ABNORMAL HIGH (ref 3–12)
Neutro Abs: 3.4 K/uL (ref 1.7–7.7)
Neutrophils Relative %: 69 % (ref 43–77)
Platelets: 199 K/uL (ref 150–400)
RBC: 3.3 MIL/uL — ABNORMAL LOW (ref 4.22–5.81)
RDW: 18.5 % — ABNORMAL HIGH (ref 11.5–15.5)
WBC: 4.9 K/uL (ref 4.0–10.5)

## 2013-07-01 LAB — COMPREHENSIVE METABOLIC PANEL WITH GFR
ALT: 13 U/L (ref 0–53)
AST: 14 U/L (ref 0–37)
CO2: 29 meq/L (ref 19–32)
Calcium: 10.1 mg/dL (ref 8.4–10.5)
Chloride: 92 meq/L — ABNORMAL LOW (ref 96–112)
GFR calc Af Amer: 14 mL/min — ABNORMAL LOW (ref >90)
GFR calc non Af Amer: 12 mL/min — ABNORMAL LOW (ref >90)
Glucose, Bld: 146 mg/dL — ABNORMAL HIGH (ref 70–99)
Sodium: 136 meq/L (ref 135–145)
Total Bilirubin: 0.4 mg/dL (ref 0.3–1.2)

## 2013-07-01 LAB — COMPREHENSIVE METABOLIC PANEL
Albumin: 3.4 g/dL — ABNORMAL LOW (ref 3.5–5.2)
Alkaline Phosphatase: 50 U/L (ref 39–117)
BUN: 21 mg/dL (ref 6–23)
Creatinine, Ser: 4.24 mg/dL — ABNORMAL HIGH (ref 0.50–1.35)
Potassium: 2.9 mEq/L — ABNORMAL LOW (ref 3.5–5.1)
Total Protein: 7.8 g/dL (ref 6.0–8.3)

## 2013-07-01 LAB — LIPASE, BLOOD: Lipase: 66 U/L — ABNORMAL HIGH (ref 11–59)

## 2013-07-01 LAB — TROPONIN I: Troponin I: <0.3 ng/mL (ref <0.30)

## 2013-07-01 MED ORDER — ONDANSETRON 8 MG PO TBDP: 8.0000 mg | ORAL_TABLET | Freq: Two times a day (BID) | ORAL | Status: DC | PRN

## 2013-07-01 MED ORDER — MECLIZINE HCL 12.5 MG PO TABS: 25.0000 mg | ORAL_TABLET | Freq: Three times a day (TID) | ORAL | Status: DC | PRN

## 2013-07-01 MED ORDER — FENTANYL CITRATE 0.05 MG/ML IJ SOLN
25.0000 ug | Freq: Once | INTRAMUSCULAR | Status: AC
  Administered 2013-07-01: 25 ug via INTRAVENOUS
  Filled 2013-07-01: qty 2

## 2013-07-01 MED ORDER — ONDANSETRON HCL 4 MG/2ML IJ SOLN
4.0000 mg | Freq: Once | INTRAMUSCULAR | Status: AC
  Administered 2013-07-01: 4 mg via INTRAVENOUS
  Filled 2013-07-01: qty 2

## 2013-07-01 NOTE — ED Notes (Signed)
Pt is a dialysis pt.  Actively vomiting.  Pt states he has been having rt flank pain x 2 wks.  Pt just got dialysis today.

## 2013-07-01 NOTE — Discharge Instructions (Signed)
Nausea and Vomiting °Nausea is a sick feeling that often comes before throwing up (vomiting). Vomiting is a reflex where stomach contents come out of your mouth. Vomiting can cause severe loss of body fluids (dehydration). Children and elderly adults can become dehydrated quickly, especially if they also have diarrhea. Nausea and vomiting are symptoms of a condition or disease. It is important to find the cause of your symptoms. °CAUSES  °· Direct irritation of the stomach lining. This irritation can result from increased acid production (gastroesophageal reflux disease), infection, food poisoning, taking certain medicines (such as nonsteroidal anti-inflammatory drugs), alcohol use, or tobacco use. °· Signals from the brain. These signals could be caused by a headache, heat exposure, an inner ear disturbance, increased pressure in the brain from injury, infection, a tumor, or a concussion, pain, emotional stimulus, or metabolic problems. °· An obstruction in the gastrointestinal tract (bowel obstruction). °· Illnesses such as diabetes, hepatitis, gallbladder problems, appendicitis, kidney problems, cancer, sepsis, atypical symptoms of a heart attack, or eating disorders. °· Medical treatments such as chemotherapy and radiation. °· Receiving medicine that makes you sleep (general anesthetic) during surgery. °DIAGNOSIS °Your caregiver may ask for tests to be done if the problems do not improve after a few days. Tests may also be done if symptoms are severe or if the reason for the nausea and vomiting is not clear. Tests may include: °· Urine tests. °· Blood tests. °· Stool tests. °· Cultures (to look for evidence of infection). °· X-rays or other imaging studies. °Test results can help your caregiver make decisions about treatment or the need for additional tests. °TREATMENT °You need to stay well hydrated. Drink frequently but in small amounts. You may wish to drink water, sports drinks, clear broth, or eat frozen  ice pops or gelatin dessert to help stay hydrated. When you eat, eating slowly may help prevent nausea. There are also some antinausea medicines that may help prevent nausea. °HOME CARE INSTRUCTIONS  °· Take all medicine as directed by your caregiver. °· If you do not have an appetite, do not force yourself to eat. However, you must continue to drink fluids. °· If you have an appetite, eat a normal diet unless your caregiver tells you differently. °· Eat a variety of complex carbohydrates (rice, wheat, potatoes, bread), lean meats, yogurt, fruits, and vegetables. °· Avoid high-fat foods because they are more difficult to digest. °· Drink enough water and fluids to keep your urine clear or pale yellow. °· If you are dehydrated, ask your caregiver for specific rehydration instructions. Signs of dehydration may include: °· Severe thirst. °· Dry lips and mouth. °· Dizziness. °· Dark urine. °· Decreasing urine frequency and amount. °· Confusion. °· Rapid breathing or pulse. °SEEK IMMEDIATE MEDICAL CARE IF:  °· You have blood or brown flecks (like coffee grounds) in your vomit. °· You have black or bloody stools. °· You have a severe headache or stiff neck. °· You are confused. °· You have severe abdominal pain. °· You have chest pain or trouble breathing. °· You do not urinate at least once every 8 hours. °· You develop cold or clammy skin. °· You continue to vomit for longer than 24 to 48 hours. °· You have a fever. °MAKE SURE YOU:  °· Understand these instructions. °· Will watch your condition. °· Will get help right away if you are not doing well or get worse. °Document Released: 09/28/2005 Document Revised: 12/21/2011 Document Reviewed: 02/25/2011 °[employer]® Patient Information ©2014 [employer]. ° °

## 2013-07-01 NOTE — ED Provider Notes (Signed)
CSN: [id number]     Arrival date & time 07/01/13  1303 History   First MD Initiated Contact with Patient 07/01/13 1308     Chief Complaint  Patient presents with  . Flank Pain   (Consider location/radiation/quality/duration/timing/severity/associated sxs/prior Treatment) HPI Comments: Pt with intermittent N/V more today, has been present for several days, mild productive cough for about 1 week, flank pain on right side non radiating for about 2 weeks.  No prior h/o similar, no h/o kidney stone, gallstones.  Per family, pt has worse pain and vomiting after eating.  Pt also is dialysis patient, completed dialysis today.  Dr. Algie Coffer is PCP, Dr. Lowell Guitar is nephrologist.  Pt has had some occasional chills as well.  No meds take nonspecifically PTA.    Patient is a 78 y.o. male presenting with flank pain. The history is provided by the patient and a relative.  Flank Pain This is a new problem. Pertinent negatives include no abdominal pain.    Past Medical History  Diagnosis Date  . Hypertension   . Hyperlipidemia   . Cancer     prostate  . Gout   . Anemia     Iron deficiency  . Thyroid disease   . Malnutrition   . Atrial fibrillation     paroxysmal  . Chronic kidney disease     Pt has HD on TUESDAY, THURSDAY and SATURDAY  at [facility]   Past Surgical History  Procedure Laterality Date  . Av fistula placement  06/2006    left forearm  . Amputation      right finger  . Incise and drain abcess      chronic complex infection right long finger  . Insertion of dialysis catheter  05/30/2010    right internal jugular Palindrome catheter  . Aortogram  06/01/11    w/ runoff   Family History  Problem Relation Age of Onset  . Hypertension Mother   . Asthma Father   . Cancer Sister     breast  . Kidney disease Brother   . Diabetes Brother    History  Substance Use Topics  . Smoking status: Former Smoker -- 0.50 packs/day    Types: Cigarettes    Quit date: 02/17/1972  .  Smokeless tobacco: Not on file  . Alcohol Use: No    Review of Systems  Constitutional: Positive for chills and appetite change.  Gastrointestinal: Positive for diarrhea. Negative for nausea, vomiting and abdominal pain.  Genitourinary: Positive for flank pain.    Allergies  Review of patient's allergies indicates no known allergies.  Home Medications   Current Outpatient Rx  Name  Route  Sig  Dispense  Refill  . aspirin 81 MG tablet   Oral   Take 81 mg by mouth daily.           . calcium acetate (PHOSLO) 667 MG capsule   Oral   Take 667 mg by mouth daily.          . citalopram (CELEXA) 20 MG tablet   Oral   Take 20 mg by mouth at bedtime as needed. Depression         . colchicine 0.6 MG tablet   Oral   Take 0.6 mg by mouth as needed.           . cyclobenzaprine (FLEXERIL) 10 MG tablet      Take one at bed time for back pain and spasms   30 tablet   0   .  diltiazem (TIAZAC) 180 MG 24 hr capsule   Oral   Take 180 mg by mouth 2 (two) times daily.           Marland Kitchen gabapentin (NEURONTIN) 100 MG capsule      100 mg 3 (three) times daily.          Marland Kitchen HYDROcodone-acetaminophen (NORCO) 5-325 MG per tablet   Oral   Take 1 tablet by mouth at bedtime as needed. pain         . isosorbide mononitrate (IMDUR) 30 MG 24 hr tablet   Oral   Take 30 mg by mouth daily.          . metoprolol (TOPROL-XL) 100 MG 24 hr tablet   Oral   Take 100 mg by mouth daily.           Marland Kitchen omeprazole (PRILOSEC) 20 MG capsule   Oral   Take 1 capsule by mouth daily.         [name] 1 % cream   Topical   Apply 1 application topically as needed.          . traMADol (ULTRAM) 50 MG tablet   Oral   Take 50 mg by mouth every 6 (six) hours as needed.          . meclizine (ANTIVERT) 12.5 MG tablet   Oral   Take 2 tablets (25 mg total) by mouth 3 (three) times daily as needed for nausea.   20 tablet   0    BP 109/57  Pulse 96  Temp(Src) 98.6 F (37 C) (Rectal)   Resp 16  SpO2 95% Physical Exam  Nursing note and vitals reviewed. Constitutional: He appears well-developed and well-nourished. No distress.  HENT:  Head: Normocephalic and atraumatic.  Eyes: Conjunctivae and EOM are normal. No scleral icterus.  Neck: Normal range of motion. Neck supple.  Cardiovascular: Normal rate, regular rhythm and intact distal pulses.   No murmur heard. Pulmonary/Chest: Effort normal. No respiratory distress. He has no wheezes.  Abdominal: Soft. Normal appearance. He exhibits no distension. There is no tenderness. There is CVA tenderness. There is no rebound and no guarding. No hernia.  Musculoskeletal: He exhibits no tenderness.  Neurological: He is alert. He displays normal reflexes. He exhibits normal muscle tone. Coordination normal.  Skin: Skin is warm and dry. No rash noted. He is not diaphoretic. No pallor.  Psychiatric: He has a normal mood and affect.    ED Course  Procedures (including critical care time) Labs Review Labs Reviewed  CBC WITH DIFFERENTIAL - Abnormal; Notable for the following:    RBC 3.30 (*)    Hemoglobin 9.5 (*)    HCT 29.4 (*)    RDW 18.5 (*)    Monocytes Relative 13 (*)    All other components within normal limits  COMPREHENSIVE METABOLIC PANEL - Abnormal; Notable for the following:    Potassium 2.9 (*)    Chloride 92 (*)    Glucose, Bld 146 (*)    Creatinine, Ser 4.24 (*)    Albumin 3.4 (*)    GFR calc non Af Amer 12 (*)    GFR calc Af Amer 14 (*)    All other components within normal limits  LIPASE, BLOOD - Abnormal; Notable for the following:    Lipase 66 (*)    All other components within normal limits  TROPONIN I   Imaging Review Ct Abdomen Pelvis Wo Contrast  07/01/2013   *RADIOLOGY REPORT*  Clinical  Data: Right flank pain  CT ABDOMEN AND PELVIS WITHOUT CONTRAST  Technique:  Multidetector CT imaging of the abdomen and pelvis was performed following the standard protocol without intravenous contrast.  Comparison:  December 09, 2009  Findings: There is a 2 mm calcified granuloma in the right lobe liver.  The liver is otherwise normal.  The spleen, pancreas, gallbladder, adrenal glands are normal.  There are bilateral renal vascular calcifications.  There is no hydronephrosis bilaterally.  There are bilateral low and high density lesions in the kidneys. These may be due to cyst, both hemorrhagic non hemorrhagic but evaluation is limited without contrast.   There is atherosclerosis of the abdominal aorta with mild ectasia of the distal abdominal aorta measuring 2.6 cm.  There is no abdominal lymphadenopathy.  There is no small bowel obstruction or diverticulitis.  The bladder is decompressed limiting evaluation. There is enlargement prostate gland measuring 4.9 cm in diameter unchanged. Prostate gland calcifications are noted.  There is minor dependent atelectasis of the lung bases.  Degenerative joint changes of the spine are identified.  IMPRESSION: No nephrolithiasis or hydroureternephrosis bilaterally.  No acute abnormality is identified in the abdomen and pelvis.   Original Report Authenticated By: Sherian Rein, M.D.    RA sat is 95% and I interpret to be adequate  ECG at time 14:59 shows SR with first degree AV block.  Normal axis, non specific intraventricular delay, non specific anterior and lateral t wave abns. Abn ECg.  No new ischemic changes compared to ECG from 12/12/12.  2:56 PM Non contrast abd/pelvis CT shows no acute abn . Lipase is marginally up at 66, but no sig changes on CT scan noted, possibly false positive.  If pt's symptoms are improved, will d/c home and he can follow up with PCP next week.    3:24 PM CT resutls are reassuring, other labs are ok, pt reports he feels much better, would like to go home and eat dinner.  Pt is jovial, family is reassured, I have told them to return if worse, otherwise follow up with Dr. Algie Coffer next week.    MDM   1. Nausea and vomiting   2. Anemia, chronic  renal failure      Pt with right flank pain for about 2 weeks, worsening gradually.  Pt has had intermittent, sporadic N/V, but more today and has had dialysis.  Pt didn't agree, but family reports his N/V and pain seems worse after eating.      Gavin Pound. Micco Bourbeau, MD 07/01/13 1527

## 2013-07-01 NOTE — ED Notes (Signed)
Bed: WA05 Expected date:  Expected time:  Means of arrival:  Comments: 

## 2013-08-10 ENCOUNTER — Encounter (HOSPITAL_COMMUNITY): Payer: Medicare Other

## 2013-08-10 ENCOUNTER — Ambulatory Visit: Payer: Medicare Other | Admitting: Family

## 2013-08-11 ENCOUNTER — Ambulatory Visit: Payer: Medicare Other | Admitting: Family

## 2013-08-11 ENCOUNTER — Encounter (HOSPITAL_COMMUNITY): Payer: Medicare Other

## 2013-08-16 ENCOUNTER — Encounter: Payer: Self-pay | Admitting: Family

## 2013-08-16 ENCOUNTER — Ambulatory Visit (INDEPENDENT_AMBULATORY_CARE_PROVIDER_SITE_OTHER): Payer: Medicare Other | Admitting: Family

## 2013-08-16 ENCOUNTER — Ambulatory Visit (HOSPITAL_COMMUNITY)
Admission: RE | Admit: 2013-08-16 | Discharge: 2013-08-16 | Disposition: A | Discharge status: Home / Self Care | Payer: Medicare Other | Source: Ambulatory Visit | Attending: Family | Admitting: Family

## 2013-08-16 ENCOUNTER — Encounter: Payer: Self-pay | Admitting: Vascular Surgery

## 2013-08-16 VITALS — BP 130/66 | HR 80 | Resp 16 | Ht 71.5 in | Wt 193.0 lb

## 2013-08-16 DIAGNOSIS — Z48812 Encounter for surgical aftercare following surgery on the circulatory system: Secondary | ICD-10-CM | POA: Insufficient documentation

## 2013-08-16 DIAGNOSIS — M79609 Pain in unspecified limb: Secondary | ICD-10-CM | POA: Insufficient documentation

## 2013-08-16 DIAGNOSIS — I739 Peripheral vascular disease, unspecified: Secondary | ICD-10-CM | POA: Insufficient documentation

## 2013-08-16 NOTE — Progress Notes (Signed)
[facility] [place] HISTORY AND PHYSICAL -PAD  History of Present Illness Brad Mcintosh is a 78 y.o. male patient that Dr. Imogene Burn has seen for hemodialysis access;  he presents today for evaluation of PAD. The only surgery he has had on his LE's is amputation of both great toes, the left first, then the right.  His left upper arm dialysis AV fistula is about 76 years old per patient and is working well.  Pt. reports claudication in both calves after walking about 200 feet, relieved by rest.  Pt. denies rest pain; denies night pain denies non healing ulcers on feet/legs. Patient complains of tingling in both hands and all fingers, denies aching or weakness in either arm. Patient denies New Medical or Surgical History.  Pt Diabetic: No Pt smoker: former smoker, quit 30-40 years ago  Pt meds include: Statin :No Betablocker: Yes ASA: Yes Other anticoagulants/antiplatelets: no  Past Medical History  Diagnosis Date  . Hypertension   . Hyperlipidemia   . Cancer     prostate  . Gout   . Anemia     Iron deficiency  . Thyroid disease   . Malnutrition   . Atrial fibrillation     paroxysmal  . Chronic kidney disease     Pt has HD on TUESDAY, THURSDAY and SATURDAY  at [facility]  . Peripheral vascular disease     Social History History  Substance Use Topics  . Smoking status: Former Smoker -- 0.50 packs/day    Types: Cigarettes    Quit date: 02/17/1972  . Smokeless tobacco: Never Used  . Alcohol Use: No    Family History Family History  Problem Relation Age of Onset  . Hypertension Mother   . Asthma Father   . Cancer Sister     breast  . Kidney disease Brother   . Diabetes Brother     Past Surgical History  Procedure Laterality Date  . Av fistula placement  06/2006    left forearm  . Amputation      right finger  . Incise and drain abcess      chronic complex infection right long finger  . Insertion of dialysis catheter  05/30/2010    right  internal jugular Palindrome catheter  . Aortogram  06/01/11    w/ runoff    No Known Allergies  Current Outpatient Prescriptions  Medication Sig Dispense Refill  . aspirin 81 MG tablet Take 81 mg by mouth daily.        . calcium acetate (PHOSLO) 667 MG capsule Take 667 mg by mouth daily.       . citalopram (CELEXA) 20 MG tablet Take 20 mg by mouth at bedtime as needed. Depression      . colchicine 0.6 MG tablet Take 0.6 mg by mouth as needed.        . cyclobenzaprine (FLEXERIL) 10 MG tablet Take one at bed time for back pain and spasms  30 tablet  0  . diltiazem (TIAZAC) 180 MG 24 hr capsule Take 180 mg by mouth 2 (two) times daily.        Marland Kitchen gabapentin (NEURONTIN) 100 MG capsule 100 mg 3 (three) times daily.       Marland Kitchen HYDROcodone-acetaminophen (NORCO) 5-325 MG per tablet Take 1 tablet by mouth at bedtime as needed. pain      . isosorbide mononitrate (IMDUR) 30 MG 24 hr tablet Take 30 mg by mouth daily.       Marland Kitchen  meclizine (ANTIVERT) 12.5 MG tablet Take 2 tablets (25 mg total) by mouth 3 (three) times daily as needed for nausea.  20 tablet  0  . metoprolol (TOPROL-XL) 100 MG 24 hr tablet Take 100 mg by mouth daily.        Marland Kitchen omeprazole (PRILOSEC) 20 MG capsule Take 1 capsule by mouth daily.      [name] 1 % cream Apply 1 application topically as needed.       . traMADol (ULTRAM) 50 MG tablet Take 50 mg by mouth every 6 (six) hours as needed.        No current facility-administered medications for this visit.    ROS: [x]  Positive   [ ]  Denies  General:[ ]  Weight loss,  [ ]  Weight gain, [ ]  Fever, [ ]  chills Neurologic: [ ]  Dizziness, [ ]  Blackouts, [ ]  Seizure [ ]  Stroke, [ ]  "Mini stroke", [ ]  Slurred speech, [ ]  Temporary blindness;  [ ] weakness, [ ]  Hoarseness Cardiac: [ ]  Chest pain/pressure, [ ]  Shortness of breath at rest [ ]  Shortness of breath with exertion,  [ ]   Atrial fibrillation or irregular heartbeat Vascular:[ ]  Pain in legs with walking, [ ]  Pain in legs at rest ,[ ]   Pain in legs at night,  [ ]   Non-healing ulcer, [ ]  Blood clot in vein/DVT,   Pulmonary: [ ]  Home oxygen, [ ]   Productive cough, [ ]  Coughing up blood,  [ ]  Asthma,  [ ]  Wheezing Musculoskeletal:  [ ]  Arthritis, [ ]  Low back pain,  [ ]  Joint pain Hematologic:[ ]  Easy Bruising, [ ]  Anemia; [ ]  Hepatitis Gastrointestinal: [ ]  Blood in stool,  [ ]  Gastroesophageal Reflux, [ ]  Trouble swallowing Urinary: [ ]  chronic Kidney disease, [ ]  on HD, [ ]  Burning with urination, [ ]  Frequent urination, [ ]  Difficulty urinating;  Skin: [ ]  Rashes, [ ]  Wounds     Physical Examination  Filed Vitals:   08/16/13 1147  BP: 130/66  Pulse: 80  Resp: 16   Filed Weights   08/16/13 1147  Weight: 193 lb (87.544 kg)   Body mass index is 26.55 kg/(m^2).   General: A&O x 3, WDWN,  Gait: normal Eyes: PERRLA, Pulmonary: CTAB, without wheezes , rales or rhonchi Cardiac: regular Rythm , with murmur .        Carotid Bruits Left Right   Negative Transmitted cardiac murmur  Aorta: is not palpable Radial pulses: right is 2+ and palpable, left is not palpable. Left brachial pulse is not palpable. Left upper arm AV fistula has a palpable thrill.                         VASCULAR EXAM: Extremities without ischemic changes  without Gangrene; without open wounds. Both great toes absent, no wounds.                                                                                                          LE Pulses LEFT RIGHT  FEMORAL  palpable   palpable        POPLITEAL  not palpable   not palpable       POSTERIOR TIBIAL  not palpable   not palpable        DORSALIS PEDIS      ANTERIOR TIBIAL not palpable  not palpable    Abdomen: soft, NT, no masses. Skin: no rashes, no ulcers noted. Musculoskeletal: no muscle wasting or atrophy.  Neurologic: A&O X 3; Appropriate Affect ; SENSATION: normal; MOTOR FUNCTION:  moving all extremities equally, motor strength 5/5 throughout. Speech is fluent/normal. CN  2-12 intact.    Non-Invasive Vascular Imaging: DATE: 08/16/2013 ABI: RIGHT: not applicable due to calcified arteries, Waveforms: monophasic;  LEFT: not applicable due to calcified arteries, Waveforms: monophasic. Both digit pressures are 55, both digit indeces are 0.41.  ASSESSMENT: ALANMICHAEL BARMORE is a 79 y.o. male patient that Dr. Imogene Burn has seen for hemodialysis access;  he presents today for evaluation of PAD. The only surgery he has had on his LE's is amputation of both great toes, the left first, then the right.  His left upper arm dialysis AV fistula is about 76 years old per patient and is working well.  Pt. reports claudication in both calves after walking about 200 feet, relieved by rest. He has no ischemic changes in his feet, no wounds. He does not smoke, is not diabetic. His risk factor for PAD and CVD is his ESRD and the vascular calcification that is part of this disease process. He is doing quite well.  PLAN:  Based on today's exam and ABI results, and after discussing with Dr. Edilia Bo, patient was advised to return to clinic in 1 year for ABI's; he was advised to return sooner if needed. I encouraged him to continue his graduated walking program which will help improve his circulation. I discussed in depth with the patient the nature of atherosclerosis, and emphasized the importance of maximal medical management including strict control of blood pressure, blood glucose, and lipid levels, obtaining regular exercise, and continued cessation of smoking.  The patient is aware that without maximal medical management the underlying atherosclerotic disease process will progress, limiting the benefit of any interventions.  The patient was given information about PAD including signs, symptoms, treatment, what symptoms should prompt the patient to seek immediate medical care, and risk reduction measures to take.  Charisse March, RN, MSN, FNP-C [facility]  [facility] Phone: [phone]  Clinic MD: Edilia Bo  08/16/2013 12:24 PM

## 2013-08-16 NOTE — Addendum Note (Signed)
Addended by: Sharee Pimple on: 08/16/2013 02:09 PM   Modules accepted: Orders

## 2013-08-16 NOTE — Patient Instructions (Addendum)
Peripheral Vascular Disease Peripheral Vascular Disease (PVD), also called Peripheral Arterial Disease (PAD), is a circulation problem caused by cholesterol (atherosclerotic plaque) deposits in the arteries. PVD commonly occurs in the lower extremities (legs) but it can occur in other areas of the body, such as your arms. The cholesterol buildup in the arteries reduces blood flow which can cause pain and other serious problems. The presence of PVD can place a person at risk for Coronary Artery Disease (CAD).  CAUSES  Causes of PVD can be many. It is usually associated with more than one risk factor such as:   High Cholesterol.  Smoking.  Diabetes.  Lack of exercise or inactivity.  High blood pressure (hypertension).  Obesity.  Family history. SYMPTOMS   When the lower extremities are affected, patients with PVD may experience:  Leg pain with exertion or physical activity. This is called INTERMITTENT CLAUDICATION. This may present as cramping or numbness with physical activity. The location of the pain is associated with the level of blockage. For example, blockage at the abdominal level (distal abdominal aorta) may result in buttock or hip pain. Lower leg arterial blockage may result in calf pain.  As PVD becomes more severe, pain can develop with less physical activity.  In people with severe PVD, leg pain may occur at rest.  Other PVD signs and symptoms:  Leg numbness or weakness.  Coldness in the affected leg or foot, especially when compared to the other leg.  A change in leg color.  Patients with significant PVD are more prone to ulcers or sores on toes, feet or legs. These may take longer to heal or may reoccur. The ulcers or sores can become infected.  If signs and symptoms of PVD are ignored, gangrene may occur. This can result in the loss of toes or loss of an entire limb.  Not all leg pain is related to PVD. Other medical conditions can cause leg pain such  as:  Blood clots (embolism) or Deep Vein Thrombosis.  Inflammation of the blood vessels (vasculitis).  Spinal stenosis. DIAGNOSIS  Diagnosis of PVD can involve several different types of tests. These can include:  Pulse Volume Recording Method (PVR). This test is simple, painless and does not involve the use of X-rays. PVR involves measuring and comparing the blood pressure in the arms and legs. An ABI (Ankle-Brachial Index) is calculated. The normal ratio of blood pressures is 1. As this number becomes smaller, it indicates more severe disease.  < 0.95  indicates significant narrowing in one or more leg vessels.  <0.8 there will usually be pain in the foot, leg or buttock with exercise.  <0.4 will usually have pain in the legs at rest.  <0.25  usually indicates limb threatening PVD.  Doppler detection of pulses in the legs. This test is painless and checks to see if you have a pulses in your legs/feet.  A dye or contrast material (a substance that highlights the blood vessels so they show up on x-ray) may be given to help your caregiver better see the arteries for the following tests. The dye is eliminated from your body by the kidney's. Your caregiver may order blood work to check your kidney function and other laboratory values before the following tests are performed:  Magnetic Resonance Angiography (MRA). An MRA is a picture study of the blood vessels and arteries. The MRA machine uses a large magnet to produce images of the blood vessels.  Computed Tomography Angiography (CTA). A CTA is a   specialized x-ray that looks at how the blood flows in your blood vessels. An IV may be inserted into your arm so contrast dye can be injected.  Angiogram. Is a procedure that uses x-rays to look at your blood vessels. This procedure is minimally invasive, meaning a small incision (cut) is made in your groin. A small tube (catheter) is then inserted into the artery of your groin. The catheter is  guided to the blood vessel or artery your caregiver wants to examine. Contrast dye is injected into the catheter. X-rays are then taken of the blood vessel or artery. After the images are obtained, the catheter is taken out. TREATMENT  Treatment of PVD involves many interventions which may include:  Lifestyle changes:  Quitting smoking.  Exercise.  Following a low fat, low cholesterol diet.  Control of diabetes.  Foot care is very important to the PVD patient. Good foot care can help prevent infection.  Medication:  Cholesterol-lowering medicine.  Blood pressure medicine.  Anti-platelet drugs.  Certain medicines may reduce symptoms of Intermittent Claudication.  Interventional/Surgical options:  Angioplasty. An Angioplasty is a procedure that inflates a balloon in the blocked artery. This opens the blocked artery to improve blood flow.  Stent Implant. A wire mesh tube (stent) is placed in the artery. The stent expands and stays in place, allowing the artery to remain open.  Peripheral Bypass Surgery. This is a surgical procedure that reroutes the blood around a blocked artery to help improve blood flow. This type of procedure may be performed if Angioplasty or stent implants are not an option. SEEK IMMEDIATE MEDICAL CARE IF:   You develop pain or numbness in your arms or legs.  Your arm or leg turns cold, becomes blue in color.  You develop redness, warmth, swelling and pain in your arms or legs. MAKE SURE YOU:   Understand these instructions.  Will watch your condition.  Will get help right away if you are not doing well or get worse. Document Released: 11/05/2004 Document Revised: 12/21/2011 Document Reviewed: 10/02/2008 Exit[employer] Patient Information 2014 [employer].  

## 2013-08-17 ENCOUNTER — Encounter (HOSPITAL_COMMUNITY): Payer: Medicare Other

## 2013-08-17 ENCOUNTER — Ambulatory Visit: Payer: Medicare Other | Admitting: Family

## 2013-08-18 ENCOUNTER — Ambulatory Visit: Payer: Medicare Other | Admitting: Family

## 2013-08-18 ENCOUNTER — Encounter (HOSPITAL_COMMUNITY): Payer: Medicare Other

## 2013-10-12 DIAGNOSIS — I219 Acute myocardial infarction, unspecified: Secondary | ICD-10-CM

## 2013-10-12 DIAGNOSIS — J189 Pneumonia, unspecified organism: Secondary | ICD-10-CM

## 2013-10-12 HISTORY — DX: Pneumonia, unspecified organism: J18.9

## 2013-10-12 HISTORY — DX: Acute myocardial infarction, unspecified: I21.9

## 2013-10-21 ENCOUNTER — Encounter (HOSPITAL_COMMUNITY): Payer: Self-pay | Admitting: Emergency Medicine

## 2013-10-21 ENCOUNTER — Emergency Department (HOSPITAL_COMMUNITY): Payer: Medicare Other

## 2013-10-21 ENCOUNTER — Inpatient Hospital Stay (HOSPITAL_COMMUNITY)
Admission: EM | Admit: 2013-10-21 | Discharge: 2013-10-26 | DRG: 853 | Disposition: A | Discharge status: Home / Self Care | Payer: Medicare Other | Attending: Internal Medicine | Admitting: Internal Medicine

## 2013-10-21 DIAGNOSIS — I079 Rheumatic tricuspid valve disease, unspecified: Secondary | ICD-10-CM | POA: Diagnosis present

## 2013-10-21 DIAGNOSIS — E46 Unspecified protein-calorie malnutrition: Secondary | ICD-10-CM | POA: Diagnosis present

## 2013-10-21 DIAGNOSIS — D631 Anemia in chronic kidney disease: Secondary | ICD-10-CM | POA: Diagnosis present

## 2013-10-21 DIAGNOSIS — J189 Pneumonia, unspecified organism: Secondary | ICD-10-CM | POA: Diagnosis present

## 2013-10-21 DIAGNOSIS — R6520 Severe sepsis without septic shock: Secondary | ICD-10-CM

## 2013-10-21 DIAGNOSIS — J181 Lobar pneumonia, unspecified organism: Secondary | ICD-10-CM

## 2013-10-21 DIAGNOSIS — N039 Chronic nephritic syndrome with unspecified morphologic changes: Secondary | ICD-10-CM

## 2013-10-21 DIAGNOSIS — Z992 Dependence on renal dialysis: Secondary | ICD-10-CM

## 2013-10-21 DIAGNOSIS — Y841 Kidney dialysis as the cause of abnormal reaction of the patient, or of later complication, without mention of misadventure at the time of the procedure: Secondary | ICD-10-CM | POA: Diagnosis not present

## 2013-10-21 DIAGNOSIS — I214 Non-ST elevation (NSTEMI) myocardial infarction: Secondary | ICD-10-CM | POA: Diagnosis present

## 2013-10-21 DIAGNOSIS — R011 Cardiac murmur, unspecified: Secondary | ICD-10-CM | POA: Diagnosis present

## 2013-10-21 DIAGNOSIS — N189 Chronic kidney disease, unspecified: Secondary | ICD-10-CM

## 2013-10-21 DIAGNOSIS — I2489 Other forms of acute ischemic heart disease: Secondary | ICD-10-CM | POA: Diagnosis present

## 2013-10-21 DIAGNOSIS — J9601 Acute respiratory failure with hypoxia: Secondary | ICD-10-CM | POA: Diagnosis present

## 2013-10-21 DIAGNOSIS — K921 Melena: Secondary | ICD-10-CM | POA: Diagnosis present

## 2013-10-21 DIAGNOSIS — I248 Other forms of acute ischemic heart disease: Secondary | ICD-10-CM

## 2013-10-21 DIAGNOSIS — I739 Peripheral vascular disease, unspecified: Secondary | ICD-10-CM

## 2013-10-21 DIAGNOSIS — I70209 Unspecified atherosclerosis of native arteries of extremities, unspecified extremity: Secondary | ICD-10-CM | POA: Diagnosis present

## 2013-10-21 DIAGNOSIS — J9600 Acute respiratory failure, unspecified whether with hypoxia or hypercapnia: Secondary | ICD-10-CM | POA: Diagnosis present

## 2013-10-21 DIAGNOSIS — I080 Rheumatic disorders of both mitral and aortic valves: Secondary | ICD-10-CM | POA: Diagnosis present

## 2013-10-21 DIAGNOSIS — E872 Acidosis, unspecified: Secondary | ICD-10-CM | POA: Diagnosis present

## 2013-10-21 DIAGNOSIS — R6521 Severe sepsis with septic shock: Secondary | ICD-10-CM

## 2013-10-21 DIAGNOSIS — Z87891 Personal history of nicotine dependence: Secondary | ICD-10-CM

## 2013-10-21 DIAGNOSIS — N186 End stage renal disease: Secondary | ICD-10-CM | POA: Diagnosis present

## 2013-10-21 DIAGNOSIS — I120 Hypertensive chronic kidney disease with stage 5 chronic kidney disease or end stage renal disease: Secondary | ICD-10-CM | POA: Diagnosis present

## 2013-10-21 DIAGNOSIS — D696 Thrombocytopenia, unspecified: Secondary | ICD-10-CM | POA: Diagnosis present

## 2013-10-21 DIAGNOSIS — I4891 Unspecified atrial fibrillation: Secondary | ICD-10-CM | POA: Diagnosis present

## 2013-10-21 DIAGNOSIS — M79609 Pain in unspecified limb: Secondary | ICD-10-CM

## 2013-10-21 DIAGNOSIS — D649 Anemia, unspecified: Secondary | ICD-10-CM

## 2013-10-21 DIAGNOSIS — E039 Hypothyroidism, unspecified: Secondary | ICD-10-CM | POA: Diagnosis present

## 2013-10-21 DIAGNOSIS — E785 Hyperlipidemia, unspecified: Secondary | ICD-10-CM | POA: Diagnosis present

## 2013-10-21 DIAGNOSIS — T82898A Other specified complication of vascular prosthetic devices, implants and grafts, initial encounter: Secondary | ICD-10-CM | POA: Diagnosis not present

## 2013-10-21 DIAGNOSIS — M109 Gout, unspecified: Secondary | ICD-10-CM | POA: Diagnosis present

## 2013-10-21 DIAGNOSIS — A419 Sepsis, unspecified organism: Principal | ICD-10-CM | POA: Diagnosis present

## 2013-10-21 DIAGNOSIS — L98499 Non-pressure chronic ulcer of skin of other sites with unspecified severity: Secondary | ICD-10-CM

## 2013-10-21 DIAGNOSIS — Z7982 Long term (current) use of aspirin: Secondary | ICD-10-CM

## 2013-10-21 DIAGNOSIS — S68118A Complete traumatic metacarpophalangeal amputation of other finger, initial encounter: Secondary | ICD-10-CM

## 2013-10-21 DIAGNOSIS — Z8546 Personal history of malignant neoplasm of prostate: Secondary | ICD-10-CM

## 2013-10-21 LAB — COMPREHENSIVE METABOLIC PANEL
ALBUMIN: 2.1 g/dL — AB (ref 3.5–5.2)
ALK PHOS: 25 U/L — AB (ref 39–117)
AST: 7 U/L (ref 0–37)
BUN: 24 mg/dL — AB (ref 6–23)
CHLORIDE: 105 meq/L (ref 96–112)
CO2: 22 mEq/L (ref 19–32)
Calcium: 6.8 mg/dL — ABNORMAL LOW (ref 8.4–10.5)
Creatinine, Ser: 4.49 mg/dL — ABNORMAL HIGH (ref 0.50–1.35)
GFR calc Af Amer: 13 mL/min — ABNORMAL LOW (ref >90)
GFR calc non Af Amer: 12 mL/min — ABNORMAL LOW (ref >90)
Glucose, Bld: 87 mg/dL (ref 70–99)
Potassium: 3 mEq/L — ABNORMAL LOW (ref 3.7–5.3)
SODIUM: 143 meq/L (ref 137–147)
TOTAL PROTEIN: 5.4 g/dL — AB (ref 6.0–8.3)
Total Bilirubin: 0.4 mg/dL (ref 0.3–1.2)

## 2013-10-21 LAB — GLUCOSE, CAPILLARY
GLUCOSE-CAPILLARY: 110 mg/dL — AB (ref 70–99)
Glucose-Capillary: 102 mg/dL — ABNORMAL HIGH (ref 70–99)

## 2013-10-21 LAB — CBC WITH DIFFERENTIAL/PLATELET
BASOS ABS: 0 10*3/uL (ref 0.0–0.1)
BASOS PCT: 0 % (ref 0–1)
EOS ABS: 0 10*3/uL (ref 0.0–0.7)
Eosinophils Relative: 0 % (ref 0–5)
HCT: 16.4 % — ABNORMAL LOW (ref 39.0–52.0)
Hemoglobin: 5.2 g/dL — CL (ref 13.0–17.0)
Lymphocytes Relative: 5 % — ABNORMAL LOW (ref 12–46)
Lymphs Abs: 0.3 10*3/uL — ABNORMAL LOW (ref 0.7–4.0)
MCH: 28 pg (ref 26.0–34.0)
MCHC: 31.7 g/dL (ref 30.0–36.0)
MCV: 88.2 fL (ref 78.0–100.0)
Monocytes Absolute: 0.2 10*3/uL (ref 0.1–1.0)
Monocytes Relative: 4 % (ref 3–12)
Neutro Abs: 4.6 10*3/uL (ref 1.7–7.7)
Neutrophils Relative %: 91 % — ABNORMAL HIGH (ref 43–77)
PLATELETS: 100 10*3/uL — AB (ref 150–400)
RBC: 1.86 MIL/uL — ABNORMAL LOW (ref 4.22–5.81)
RDW: 18.9 % — AB (ref 11.5–15.5)
WBC: 5 10*3/uL (ref 4.0–10.5)

## 2013-10-21 LAB — POCT I-STAT 3, ART BLOOD GAS (G3+)
Acid-Base Excess: 2 mmol/L (ref 0.0–2.0)
Bicarbonate: 25.9 mEq/L — ABNORMAL HIGH (ref 20.0–24.0)
O2 Saturation: 71 %
PCO2 ART: 37.7 mmHg (ref 35.0–45.0)
PH ART: 7.445 (ref 7.350–7.450)
Patient temperature: 98.1
TCO2: 27 mmol/L (ref 0–100)
pO2, Arterial: 35 mmHg — CL (ref 80.0–100.0)

## 2013-10-21 LAB — OCCULT BLOOD X 1 CARD TO LAB, STOOL: FECAL OCCULT BLD: NEGATIVE

## 2013-10-21 LAB — PROTIME-INR
INR: 1.21 (ref 0.00–1.49)
Prothrombin Time: 15 seconds (ref 11.6–15.2)

## 2013-10-21 LAB — RETICULOCYTES
RBC.: 4.05 MIL/uL — ABNORMAL LOW (ref 4.22–5.81)
Retic Count, Absolute: 56.7 10*3/uL (ref 19.0–186.0)
Retic Ct Pct: 1.4 % (ref 0.4–3.1)

## 2013-10-21 LAB — MRSA PCR SCREENING: MRSA by PCR: NEGATIVE

## 2013-10-21 LAB — PREPARE RBC (CROSSMATCH)

## 2013-10-21 LAB — APTT: aPTT: 30 seconds (ref 24–37)

## 2013-10-21 LAB — PROCALCITONIN: PROCALCITONIN: 142.55 ng/mL

## 2013-10-21 LAB — HEMOGLOBIN AND HEMATOCRIT, BLOOD
HCT: 36 % — ABNORMAL LOW (ref 39.0–52.0)
HEMOGLOBIN: 11.6 g/dL — AB (ref 13.0–17.0)

## 2013-10-21 LAB — LACTATE DEHYDROGENASE: LDH: 218 U/L (ref 94–250)

## 2013-10-21 LAB — LACTIC ACID, PLASMA: LACTIC ACID, VENOUS: 1.9 mmol/L (ref 0.5–2.2)

## 2013-10-21 LAB — CG4 I-STAT (LACTIC ACID): LACTIC ACID, VENOUS: 3 mmol/L — AB (ref 0.5–2.2)

## 2013-10-21 LAB — OCCULT BLOOD, POC DEVICE: FECAL OCCULT BLD: NEGATIVE

## 2013-10-21 MED ORDER — SODIUM CHLORIDE 0.9 % IV BOLUS (SEPSIS)
1000.0000 mL | Freq: Once | INTRAVENOUS | Status: AC
  Administered 2013-10-21: 1000 mL via INTRAVENOUS

## 2013-10-21 MED ORDER — SODIUM CHLORIDE 0.9 % IV SOLN
80.0000 mg | Freq: Once | INTRAVENOUS | Status: AC
  Administered 2013-10-21: 80 mg via INTRAVENOUS
  Filled 2013-10-21: qty 80

## 2013-10-21 MED ORDER — VANCOMYCIN HCL 10 G IV SOLR
2000.0000 mg | INTRAVENOUS | Status: AC
  Administered 2013-10-21: 2000 mg via INTRAVENOUS
  Filled 2013-10-21: qty 2000

## 2013-10-21 MED ORDER — VANCOMYCIN HCL IN DEXTROSE 1-5 GM/200ML-% IV SOLN
1000.0000 mg | Freq: Once | INTRAVENOUS | Status: DC
  Filled 2013-10-21: qty 200

## 2013-10-21 MED ORDER — OSELTAMIVIR PHOSPHATE 75 MG PO CAPS: 75.0000 mg | ORAL_CAPSULE | Freq: Every day | ORAL | Status: DC

## 2013-10-21 MED ORDER — DEXTROSE 5 % IV SOLN
500.0000 mg | INTRAVENOUS | Status: DC
  Administered 2013-10-21 – 2013-10-24 (×4): 500 mg via INTRAVENOUS
  Filled 2013-10-21 (×7): qty 500

## 2013-10-21 MED ORDER — PIPERACILLIN-TAZOBACTAM IN DEX 2-0.25 GM/50ML IV SOLN
2.2500 g | Freq: Three times a day (TID) | INTRAVENOUS | Status: DC
  Administered 2013-10-21 – 2013-10-26 (×14): 2.25 g via INTRAVENOUS
  Filled 2013-10-21 (×18): qty 50

## 2013-10-21 MED ORDER — OSELTAMIVIR PHOSPHATE 6 MG/ML PO SUSR: 30.0000 mg | ORAL | Status: DC

## 2013-10-21 MED ORDER — DEXTROSE 5 % IV SOLN
2.0000 g | Freq: Once | INTRAVENOUS | Status: AC
  Administered 2013-10-21: 2 g via INTRAVENOUS

## 2013-10-21 MED ORDER — ONDANSETRON HCL 4 MG/2ML IJ SOLN
INTRAMUSCULAR | Status: AC
  Filled 2013-10-21: qty 2

## 2013-10-21 MED ORDER — ASPIRIN 300 MG RE SUPP
300.0000 mg | RECTAL | Status: AC
  Administered 2013-10-21: 300 mg via RECTAL
  Filled 2013-10-21: qty 1

## 2013-10-21 MED ORDER — SODIUM CHLORIDE 0.9 % IV SOLN: 250.0000 mL | INTRAVENOUS | Status: DC | PRN

## 2013-10-21 MED ORDER — ONDANSETRON HCL 4 MG/2ML IJ SOLN: 4.0000 mg | Freq: Three times a day (TID) | INTRAMUSCULAR | Status: DC | PRN

## 2013-10-21 MED ORDER — DEXTROSE 5 % IV SOLN: 2.0000 g | INTRAVENOUS | Status: DC

## 2013-10-21 MED ORDER — ASPIRIN 81 MG PO CHEW: 324.0000 mg | CHEWABLE_TABLET | ORAL | Status: AC

## 2013-10-21 MED ORDER — HEPARIN SODIUM (PORCINE) 5000 UNIT/ML IJ SOLN
5000.0000 [IU] | Freq: Three times a day (TID) | INTRAMUSCULAR | Status: DC
  Filled 2013-10-21 (×2): qty 1

## 2013-10-21 MED ORDER — OSELTAMIVIR PHOSPHATE 6 MG/ML PO SUSR
30.0000 mg | Freq: Once | ORAL | Status: AC
  Administered 2013-10-21: 30 mg via ORAL
  Filled 2013-10-21: qty 5

## 2013-10-21 MED ORDER — VANCOMYCIN HCL IN DEXTROSE 1-5 GM/200ML-% IV SOLN
1000.0000 mg | INTRAVENOUS | Status: DC
  Administered 2013-10-24: 13:00:00 1000 mg via INTRAVENOUS
  Filled 2013-10-21: qty 200

## 2013-10-21 MED ORDER — ONDANSETRON HCL 4 MG/2ML IJ SOLN
4.0000 mg | Freq: Once | INTRAMUSCULAR | Status: AC
  Administered 2013-10-21: 4 mg via INTRAVENOUS

## 2013-10-21 MED ORDER — SODIUM CHLORIDE 0.9 % IV SOLN
8.0000 mg/h | INTRAVENOUS | Status: DC
  Administered 2013-10-21: 8 mg/h via INTRAVENOUS
  Filled 2013-10-21 (×2): qty 80

## 2013-10-21 NOTE — H&P (Cosign Needed)
PULMONARY  / CRITICAL CARE MEDICINE  Name: Brad Mcintosh MRN: [id number] DOB: 1935/01/16    ADMISSION DATE:  10/21/2013 CONSULTATION DATE:  10/21/12  REFERRING MD :  ED PRIMARY SERVICE: Pulm CCM  CHIEF COMPLAINT:  Syncope and Fever  BRIEF PATIENT DESCRIPTION: 79 yo AAM who was at HD today when he syncopized following HD.  At that time he was found to have a temperature of 100.4.  He vomited twice upon awakening, when standing up.  SIGNIFICANT EVENTS / STUDIES:  CXR ? Left basilar pneumonia, no pulmonary edema or cardiomegaly  LINES / TUBES: Right femoral Arterial line  CULTURES: Blood x2  ANTIBIOTICS: Cefepime and Vancomycin  HISTORY OF PRESENT ILLNESS:  79 yo AAM who was at HD today when he syncopized following HD.  At that time he was found to have a temperature of 100.4.  He vomited twice upon awakening, when standing up.  Patient reports that he has felt ill for the past few days, mild nausea, and cough that is not productive for purulent sputum.  He has had no syncopal episodes at home, chest pain, palpations, jaw pain, or arm pain.  Denies fever, chills, decreased appetite, myalgias, sick contacts, confusion, and was at baseline this morning (as per his son).  Little history can be obtained from the patient other than simple questions due to moderate confusion and disorientation.  Patient does report that he has had black stools for the past 3 weeks.  Denies hematemesis or hematochezia  Patient reports he was at baseline this morning, and this is corroborated by his son  In the ED, patient with hemoccult negative stools on rectal swab, however on CCM exam patient with black tarry appearing stool on DRE.  Has received his flu shot   PMH:  HTN, HLD, Afib, hypothyroid, CKD, PVD, and prior cancer of the prostate  PAST MEDICAL HISTORY :  Past Medical History  Diagnosis Date  . Hypertension   . Hyperlipidemia   . Cancer     prostate  . Gout   . Anemia     Iron deficiency   . Thyroid disease   . Malnutrition   . Atrial fibrillation     paroxysmal  . Chronic kidney disease     Pt has HD on TUESDAY, THURSDAY and SATURDAY  at [facility]  . Peripheral vascular disease    Past Surgical History  Procedure Laterality Date  . Av fistula placement  06/2006    left forearm  . Amputation      right finger  . Incise and drain abcess      chronic complex infection right long finger  . Insertion of dialysis catheter  05/30/2010    right internal jugular Palindrome catheter  . Aortogram  06/01/11    w/ runoff   Prior to Admission medications   Medication Sig Start Date End Date Taking? Authorizing Provider  calcium acetate (PHOSLO) 667 MG capsule Take 667 mg by mouth 3 (three) times daily with meals.  06/20/12  Yes Historical Provider, MD  cinacalcet (SENSIPAR) 60 MG tablet Take 60 mg by mouth daily. Takes 1 tablet with evening meal   Yes Historical Provider, MD  citalopram (CELEXA) 20 MG tablet Take 20 mg by mouth at bedtime. Depression   Yes Historical Provider, MD  cyclobenzaprine (FLEXERIL) 10 MG tablet Take one at bed time for back pain and spasms 06/02/13  Yes Orma Flaming, MD  dexlansoprazole (DEXILANT) 60 MG capsule Take 60 mg by mouth  daily.   Yes Historical Provider, MD  gabapentin (NEURONTIN) 100 MG capsule Take 100 mg by mouth 2 (two) times daily.  06/16/11  Yes Historical Provider, MD  HYDROcodone-acetaminophen (NORCO) 5-325 MG per tablet Take 1 tablet by mouth at bedtime as needed. pain   Yes Historical Provider, MD  metoprolol (TOPROL-XL) 100 MG 24 hr tablet Take 100 mg by mouth daily.     Yes Historical Provider, MD  omeprazole (PRILOSEC) 20 MG capsule Take 1 capsule by mouth at bedtime.  10/13/12  Yes Historical Provider, MD  simvastatin (ZOCOR) 10 MG tablet Take 10 mg by mouth at bedtime.   Yes Historical Provider, MD  traMADol (ULTRAM) 50 MG tablet Take 50 mg by mouth every 6 (six) hours as needed for moderate pain.  05/09/13  Yes Historical Provider, MD   acetaminophen (TYLENOL) 160 MG/5ML suspension Take 1,000 mg by mouth every 6 (six) hours as needed.    Historical Provider, MD  ondansetron (ZOFRAN-ODT) 4 MG disintegrating tablet Take 4 mg by mouth every 8 (eight) hours as needed for nausea or vomiting.    Historical Provider, MD   No Known Allergies  FAMILY HISTORY:  Family History  Problem Relation Age of Onset  . Hypertension Mother   . Asthma Father   . Cancer Sister     breast  . Kidney disease Brother   . Diabetes Brother    SOCIAL HISTORY:  reports that he quit smoking about 41 years ago. His smoking use included Cigarettes. He smoked 0.50 packs per day. He has never used smokeless tobacco. He reports that he does not drink alcohol or use illicit drugs.  REVIEW OF SYSTEMS:  Positives in BOLD  Constitutional: Negative for fever, fatigue, chills, weight loss,  Respiratory: Reports non productive cough (scant white sputum), no sob denies hemoptysis, sputum production, wheezing and no stridor.  Cardiovascular: Negative for chest pain, palpitations, orthopnea, claudication, leg swelling and PND.  Gastrointestinal: Reports melena for the past 3 weeks.  Today had nausea and vomiting in ED.  Possible heart burn (chronic).  Negative for abdominal pain, diarrhea, constipation, hematachezia Genitourinary: Patient Anuric due to ESRD Musculoskeletal: Negative for myalgias, joint pain and falls.  Skin: Negative for itching and rash.  Neurological: Dizziness today with LOC (patient remembers only at HD unit) Negative for tremors, sensory change, focal weakness, seizures,  Or headaches.   SUBJECTIVE:   VITAL SIGNS: Temp:  [98.6 F (37 C)-101.8 F (38.8 C)] 98.8 F (37.1 C) (01/10 1901) Pulse Rate:  [31-131] 112 (01/10 1910) Resp:  [22-47] 26 (01/10 2000) BP: (55-133)/(38-108) 84/59 mmHg (01/10 2000) SpO2:  [62 %-100 %] 100 % (01/10 1910) Arterial Line BP: (101)/(56) 101/56 mmHg (01/10 2000) HEMODYNAMICS:   VENTILATOR SETTINGS:    INTAKE / OUTPUT: Intake/Output     01/10 0701 - 01/11 0700   Blood 325   Total Intake 325   Net +325         PHYSICAL EXAMINATION: General:  Lethargic, febrile, no acute distress, no diaphoresis.  Hypotensive with systolic in the 44'W Neuro:  Patient moves all extremities, no focal deficits, lethargic and in/out of sleep during interview HEENT:  Noted conjunctivits, PERRL, EOMI, no scleral edema Cardiovascular:  Tachycardic into the 130s during initial interview, no mrg, no heaves, normal s1 s2 Lungs:  Clear on exam, unable to appreciate any rales or rhonchi even in left lung.  Decrease breath sounds in left lung Abdomen:  Nontender, nondistended.  Normal bowel sounds, noted MELENA on DRE Musculoskeletal:  No soreness, tenderness, or edema.   Skin:  No rashes, sores, or skin breakdown  LABS:  Recent Labs Lab 10/21/13 1644 10/21/13 1708 10/21/13 1753  HGB 5.2*  --   --   WBC 5.0  --   --   PLT 100*  --   --   NA 143  --   --   K 3.0*  --   --   CL 105  --   --   CO2 22  --   --   GLUCOSE 87  --   --   BUN 24*  --   --   CREATININE 4.49*  --   --   CALCIUM 6.8*  --   --   AST 7  --   --   ALT <5  --   --   ALKPHOS 25*  --   --   BILITOT 0.4  --   --   PROT 5.4*  --   --   ALBUMIN 2.1*  --   --   LATICACIDVEN  --  3.00*  --   PHART  --   --  7.445  PCO2ART  --   --  37.7  PO2ART  --   --  35.0*    Recent Labs Lab 10/21/13 1627  GLUCAP 102*    CXR: Patient with questionable left lobe pneumonia  ASSESSMENT / PLAN:  PULMONARY A:  Sepsis with possible left basilar pneumonia Patient ESRD with Outpatient HD No hx of recent hospitalization AMS, shock (improving with IVF/PRBC) resuscitation, and severe anemia  P:   Source:  Lung, no evidence of intraabdominal infection or graft infection  Screen for flu as well as blood and sputum cultures  Access in place  Radial artery on right <3 mm on exam, left arm with fistula.  Right femoral arterial line  placed  central access: CVP goal 8-12, MAP >65, SCVO2 >  Received 2 liters of crystalloid  Patient with hemoglobin of 5.2, resustation with PRBC.  1 unit transfused with large improvement in hemodynamics.  send blood/sputum/urine cultures, trend procaclitonin, follow up lactate Q4 hours x3  empiric abx with Vancomycin and Zosyn; Addition of azithromycin for atypical coverage, narrow as cultures dictate   Tamiflu until flu screen negative  Optimize hemodynamics as above  Avoid hypo/hyperglycemia, hyper/hypoxia, VAP precautions as needed  CARDIOVASCULAR A: HTN Currently hypotensive P:   Ongoing resuscitation  RENAL A:  ESRD No current electrolyte abnormalities P:    Will need Nephro consult in the AM  GASTROINTESTINAL A:  Melena with severe anemia Patient non bloody emesis in the ED DRE with melena, however rectal swab heme negative No abdominal pain or other signs of acute abdomin/peritonitis  P:    [facility] consulted  Protonix gtt started  Serial H/H with goals of transfusion being >7, pending repeat cbc following 1 unit of PRBC  Negative bilirubin, will check haptoglobin, reticulocyte count, iron, ferritin, and ldh  HEMATOLOGIC A:  Anemia and thrombocytopenia P:  See above If more than three PRBC or plt <50 K with active bleeding will transfuse plt and FFP  INFECTIOUS A:  Sepsis  P:   As above  ENDOCRINE A:  Hypothyroidism   P:   Patient NPO Oral meds once more stable  NEUROLOGIC A:  Lethargic P:   Due to primary issue (sepsis and severe anemia)  I have personally obtained a history, examined the patient, evaluated laboratory and imaging results, formulated the assessment and plan and  placed orders. CRITICAL CARE: The patient is critically ill with multiple organ systems failure and requires high complexity decision making for assessment and support, frequent evaluation and titration of therapies, application of advanced monitoring  technologies and extensive interpretation of multiple databases. Critical Care Time devoted to patient care services described in this note is 45 minutes.    Pulmonary and [place] Pager: [phone]  10/21/2013, 8:54 PM

## 2013-10-21 NOTE — ED Notes (Signed)
Pt from dialysis center via [facility].  Pt completed dialysis and lost consciousness for no longer than a minute as the staff were standing him up.  Pt vomited x2 after episode, given 4 mg of Zofran and 160 mg of Tylenol.  Graft still accessed. Pt in NAD, A&O.

## 2013-10-21 NOTE — Procedures (Signed)
Central Venous Catheter Insertion Procedure Note Brad Mcintosh [id number] Apr 27, 1935  Procedure: Insertion of Central Venous Catheter Indications: Assessment of intravascular volume, Drug and/or fluid administration and Frequent blood sampling  Procedure Details Consent: Unable to obtain consent because of altered level of consciousness. Time Out: Verified patient identification, verified procedure, site/side was marked, verified correct patient position, special equipment/implants available, medications/allergies/relevent history reviewed, required imaging and test results available.  Performed  Maximum sterile technique was used including antiseptics, cap, gloves, gown, hand hygiene, mask and sheet. Skin prep: Chlorhexidine; local anesthetic administered A antimicrobial bonded/coated triple lumen catheter was placed in the left internal jugular vein using the Seldinger technique under ultrasound guidance with verification of wire in lumen with appropriate blood return, however it appears that wire directed cephlad and CVL malpositioned.  CVL removed and pressure placed.    Rechecked site in 30 minutes without evidence of hematoma.  Evaluation Blood flow good Complications: Complications of coiled in IJ; removed Patient did tolerate procedure well. Chest X-ray ordered to verify placement.  CXR: coiled in IJ  Brad Mcintosh 10/21/2013, 9:57 PM

## 2013-10-21 NOTE — ED Notes (Signed)
istat lactic acid shown to Dr Doy Mince

## 2013-10-21 NOTE — Progress Notes (Signed)
IV Team- Left graft needle removed. Needle intact. Pressure held x49minutes. Gauze pressure dsg applied. No active bleeding noted at this time. Bedside RN to continue to monitor.  Randol Kern, RN

## 2013-10-21 NOTE — Progress Notes (Addendum)
ANTIBIOTIC CONSULT NOTE - INITIAL  Pharmacy Consult for Vancomycin and Cefepime Indication: pneumonia  No Known Allergies  Patient Measurements:   Adjusted Body Weight:   Vital Signs: Temp: 101.8 F (38.8 C) (01/10 1653) Temp src: Rectal (01/10 1653) BP: 74/50 mmHg (01/10 1705) Pulse Rate: 80 (01/10 1705) Intake/Output from previous day:   Intake/Output from this shift:    Labs: No results found for this basename: WBC, HGB, PLT, LABCREA, CREATININE,  in the last 72 hours The CrCl is unknown because both a height and weight (above a minimum accepted value) are required for this calculation. No results found for this basename: VANCOTROUGH, VANCOPEAK, VANCORANDOM, GENTTROUGH, GENTPEAK, GENTRANDOM, TOBRATROUGH, TOBRAPEAK, TOBRARND, AMIKACINPEAK, AMIKACINTROU, AMIKACIN,  in the last 72 hours   Microbiology: No results found for this or any previous visit (from the past 720 hour(s)).  Medical History: Past Medical History  Diagnosis Date  . Hypertension   . Hyperlipidemia   . Cancer     prostate  . Gout   . Anemia     Iron deficiency  . Thyroid disease   . Malnutrition   . Atrial fibrillation     paroxysmal  . Chronic kidney disease     Pt has HD on TUESDAY, THURSDAY and SATURDAY  at [facility]  . Peripheral vascular disease     Medications:  Scheduled:   Assessment: 79yo male, ESRD-TTS HD with sepsis, presenting following a syncopal episode and became febrile en route to ED.  His EDW per info from HD center is 84.5kg.  Pt has received Cefepime 2gm in ED, no Vancomycin at this time, and no antibiotics at HD center.  Goal of Therapy:  pre-HD Vancomycin level 15-25  Plan:  1-  Cefepime 2gm IV qHD, next on 1/13 2-  Vancomycin 2000mg  IV x 1, then 1000mg  IV qHD 3-  F/U cx data 4-  SS Vancomycin level  Gracy Bruins, PharmD Clinical Pharmacist [facility]   Addendum: Critical care has consulted pharmacy with plan to switch cefepime  to zosyn. Start zosyn 2.25 gm IV q 8 hours. Continue Vancomycin 1 gm IV Q HD. Also adding azithromycin 500 mg IV Q 24 per MD orders.   Albertina Parr, PharmD.  Clinical Pharmacist Pager [phone]

## 2013-10-21 NOTE — ED Provider Notes (Signed)
CSN: [id number]     Arrival date & time 10/21/13  1604 History   First MD Initiated Contact with Patient 10/21/13 1607     Chief Complaint  Patient presents with  . Loss of Consciousness   (Consider location/radiation/quality/duration/timing/severity/associated sxs/prior Treatment) HPI Comments: 79 yo male presenting from dialysis after a syncopal episode.  Found to be febrile in route. Initially normotensive in ED, but quickly became hypotensive. Complains of cough for about a week.  Patient is a 79 y.o. male presenting with syncope.  Loss of Consciousness Episode history:  Single Most recent episode:  Today Duration:  1 minute Timing:  Constant Progression:  Resolved Chronicity:  New Context comment:  After dialysis, upon standing Witnessed: yes   Relieved by:  Lying down Ineffective treatments:  None tried Associated symptoms: fever (prior to arrival\)   Associated symptoms: no chest pain, no difficulty breathing, no nausea, no shortness of breath and no vomiting   Associated symptoms comment:  Cough   Past Medical History  Diagnosis Date  . Hypertension   . Hyperlipidemia   . Cancer     prostate  . Gout   . Anemia     Iron deficiency  . Thyroid disease   . Malnutrition   . Atrial fibrillation     paroxysmal  . Chronic kidney disease     Pt has HD on TUESDAY, THURSDAY and SATURDAY  at [facility]  . Peripheral vascular disease    Past Surgical History  Procedure Laterality Date  . Av fistula placement  06/2006    left forearm  . Amputation      right finger  . Incise and drain abcess      chronic complex infection right long finger  . Insertion of dialysis catheter  05/30/2010    right internal jugular Palindrome catheter  . Aortogram  06/01/11    w/ runoff   Family History  Problem Relation Age of Onset  . Hypertension Mother   . Asthma Father   . Cancer Sister     breast  . Kidney disease Brother   . Diabetes Brother    History  Substance Use  Topics  . Smoking status: Former Smoker -- 0.50 packs/day    Types: Cigarettes    Quit date: 02/17/1972  . Smokeless tobacco: Never Used  . Alcohol Use: No    Review of Systems  Constitutional: Positive for fever (prior to arrival\).  HENT: Negative for congestion.   Respiratory: Negative for cough and shortness of breath.   Cardiovascular: Positive for syncope. Negative for chest pain.  Gastrointestinal: Negative for nausea, vomiting, abdominal pain and diarrhea.  All other systems reviewed and are negative.    Allergies  Review of patient's allergies indicates no known allergies.  Home Medications   Current Outpatient Rx  Name  Route  Sig  Dispense  Refill  . calcium acetate (PHOSLO) 667 MG capsule   Oral   Take 667 mg by mouth 3 (three) times daily with meals.          . cinacalcet (SENSIPAR) 60 MG tablet   Oral   Take 60 mg by mouth daily. Takes 1 tablet with evening meal         . citalopram (CELEXA) 20 MG tablet   Oral   Take 20 mg by mouth at bedtime. Depression         . cyclobenzaprine (FLEXERIL) 10 MG tablet      Take one at bed time for  back pain and spasms   30 tablet   0   . dexlansoprazole (DEXILANT) 60 MG capsule   Oral   Take 60 mg by mouth daily.         Marland Kitchen gabapentin (NEURONTIN) 100 MG capsule   Oral   Take 100 mg by mouth 2 (two) times daily.          Marland Kitchen HYDROcodone-acetaminophen (NORCO) 5-325 MG per tablet   Oral   Take 1 tablet by mouth at bedtime as needed. pain         . metoprolol (TOPROL-XL) 100 MG 24 hr tablet   Oral   Take 100 mg by mouth daily.           Marland Kitchen omeprazole (PRILOSEC) 20 MG capsule   Oral   Take 1 capsule by mouth at bedtime.          . simvastatin (ZOCOR) 10 MG tablet   Oral   Take 10 mg by mouth at bedtime.         . traMADol (ULTRAM) 50 MG tablet   Oral   Take 50 mg by mouth every 6 (six) hours as needed for moderate pain.          Marland Kitchen acetaminophen (TYLENOL) 160 MG/5ML suspension   Oral    Take 1,000 mg by mouth every 6 (six) hours as needed.         Marland Kitchen aspirin 81 MG tablet   Oral   Take 81 mg by mouth daily.           . colchicine 0.6 MG tablet   Oral   Take 0.6 mg by mouth as needed.           . diltiazem (TIAZAC) 180 MG 24 hr capsule   Oral   Take 180 mg by mouth 2 (two) times daily.           . isosorbide mononitrate (IMDUR) 30 MG 24 hr tablet   Oral   Take 30 mg by mouth daily.          . meclizine (ANTIVERT) 12.5 MG tablet   Oral   Take 2 tablets (25 mg total) by mouth 3 (three) times daily as needed for nausea.   20 tablet   0   . ondansetron (ZOFRAN-ODT) 4 MG disintegrating tablet   Oral   Take 4 mg by mouth every 8 (eight) hours as needed for nausea or vomiting.         Allayne Butcher 1 % cream   Topical   Apply 1 application topically as needed.           BP 133/108  Pulse 131  Temp(Src) 99.7 F (37.6 C) (Oral)  Resp 36  SpO2 96% Physical Exam  Nursing note and vitals reviewed. Constitutional: He is oriented to person, place, and time. He appears well-developed and well-nourished. No distress.  HENT:  Head: Normocephalic and atraumatic.  Mouth/Throat: Oropharynx is clear and moist.  Eyes: Conjunctivae are normal. Pupils are equal, round, and reactive to light. No scleral icterus.  Neck: Neck supple.  Cardiovascular: Normal rate, regular rhythm, normal heart sounds and intact distal pulses.   No murmur heard. Pulmonary/Chest: Effort normal and breath sounds normal. No stridor. No respiratory distress. He has no wheezes. He has no rales.  Abdominal: Soft. He exhibits no distension. There is no tenderness.  Genitourinary: Guaiac negative stool (black stool, not melanic).  Musculoskeletal: Normal range of motion. He exhibits no edema.  Neurological: He is alert and oriented to person, place, and time.  Skin: Skin is warm and dry. No rash noted.  Psychiatric: He has a normal mood and affect. His behavior is normal.    ED Course   CRITICAL CARE Performed by: Serita Grit DAVID Authorized by: Serita Grit DAVID Total critical care time: 30 minutes Critical care time was exclusive of separately billable procedures and treating other patients. Critical care was necessary to treat or prevent imminent or life-threatening deterioration of the following conditions: circulatory failure and sepsis. Critical care was time spent personally by me on the following activities: development of treatment plan with patient or surrogate, discussions with consultants, evaluation of patient's response to treatment, examination of patient, obtaining history from patient or surrogate, ordering and performing treatments and interventions, ordering and review of laboratory studies, ordering and review of radiographic studies, pulse oximetry, re-evaluation of patient's condition and review of old charts.   (including critical care time) Labs Review Labs Reviewed  CBC WITH DIFFERENTIAL - Abnormal; Notable for the following:    RBC 1.86 (*)    Hemoglobin 5.2 (*)    HCT 16.4 (*)    RDW 18.9 (*)    Platelets 100 (*)    Neutrophils Relative % 91 (*)    Lymphocytes Relative 5 (*)    Lymphs Abs 0.3 (*)    All other components within normal limits  COMPREHENSIVE METABOLIC PANEL - Abnormal; Notable for the following:    Potassium 3.0 (*)    BUN 24 (*)    Creatinine, Ser 4.49 (*)    Calcium 6.8 (*)    Total Protein 5.4 (*)    Albumin 2.1 (*)    Alkaline Phosphatase 25 (*)    GFR calc non Af Amer 12 (*)    GFR calc Af Amer 13 (*)    All other components within normal limits  GLUCOSE, CAPILLARY - Abnormal; Notable for the following:    Glucose-Capillary 102 (*)    All other components within normal limits  HEMOGLOBIN AND HEMATOCRIT, BLOOD - Abnormal; Notable for the following:    Hemoglobin 11.6 (*)    HCT 36.0 (*)    All other components within normal limits  RETICULOCYTES - Abnormal; Notable for the following:    RBC. 4.05 (*)    All  other components within normal limits  GLUCOSE, CAPILLARY - Abnormal; Notable for the following:    Glucose-Capillary 110 (*)    All other components within normal limits  TROPONIN I - Abnormal; Notable for the following:    Troponin I 2.43 (*)    All other components within normal limits  CG4 I-STAT (LACTIC ACID) - Abnormal; Notable for the following:    Lactic Acid, Venous 3.00 (*)    All other components within normal limits  POCT I-STAT 3, BLOOD GAS (G3+) - Abnormal; Notable for the following:    pO2, Arterial 35.0 (*)    Bicarbonate 25.9 (*)    All other components within normal limits  MRSA PCR SCREENING  CULTURE, BLOOD (ROUTINE X 2)  CULTURE, BLOOD (ROUTINE X 2)  URINE CULTURE  RESPIRATORY VIRUS PANEL  CULTURE, EXPECTORATED SPUTUM-ASSESSMENT  OCCULT BLOOD X 1 CARD TO LAB, STOOL  LACTATE DEHYDROGENASE  LACTIC ACID, PLASMA  PROCALCITONIN  APTT  PROTIME-INR  URINALYSIS, ROUTINE W REFLEX MICROSCOPIC  HEMOGLOBIN AND HEMATOCRIT, BLOOD  CBC  BASIC METABOLIC PANEL  HAPTOGLOBIN  PROCALCITONIN  IRON AND TIBC  FERRITIN  TRANSFERRIN  TROPONIN I  TROPONIN I  OCCULT BLOOD, POC DEVICE  TYPE AND SCREEN  PREPARE RBC (CROSSMATCH)   Imaging Review Dg Chest Portable 1 View  10/21/2013   CLINICAL DATA:  Status post central line placement  EXAM: PORTABLE CHEST - 1 VIEW  COMPARISON:  10/21/2013  FINDINGS: Cardiac shadow is stable. Aortic calcifications are again seen. Left basilar infiltrate is again noted. The right lung remains clear. There is been placement of a right-sided central venous line although it is coiled within the neck. Is difficult to assess whether it lies completely within the vein or within the adjacent soft tissues. Clinical correlation as to blood withdrawal is recommended.  IMPRESSION: Status post central line placement coiled within the right neck. Correlation with the ability to aspirate blood is recommended.   Electronically Signed   By: Inez Catalina M.D.   On:  10/21/2013 20:28   Dg Chest Port 1 View  10/21/2013   CLINICAL DATA:  Loss of consciousness  EXAM: PORTABLE CHEST - 1 VIEW  COMPARISON:  11/16/2012  FINDINGS: Cardiac shadow is mildly enlarged. The lungs are well aerated with diffuse increased density in the left mid and lower lung consistent with acute pneumonia. No bony abnormality is noted.  IMPRESSION: Left basilar pneumonia.   Electronically Signed   By: Inez Catalina M.D.   On: 10/21/2013 17:08  All radiology studies independently viewed by me.     EKG Interpretation    Date/Time:    Ventricular Rate:    PR Interval:    QRS Duration:   QT Interval:    QTC Calculation:   R Axis:     Text Interpretation:              MDM   1. Septic shock   2. Healthcare-associated pneumonia   3. Anemia    79 year old male presenting after a syncopal episode from dialysis. Initially afebrile and normotensive.  However, rectal temp elevated and her became hypotensive. Code Sepsis.  Suspect pneumonia.  Vanc and Cefepime ordered.    CXR shows pneumonia.  Blood pressure did not significantly improve after 2L NS.  Labs showed Hg of 5.2.  He was resuscitated with blood, which ultimately improved his blood pressures.  Critical care was consulted and admitted to the ICU.    Houston Siren, MD 10/22/13 [phone]

## 2013-10-22 ENCOUNTER — Inpatient Hospital Stay (HOSPITAL_COMMUNITY): Payer: Medicare Other

## 2013-10-22 ENCOUNTER — Encounter (HOSPITAL_COMMUNITY): Payer: Self-pay | Admitting: *Deleted

## 2013-10-22 DIAGNOSIS — R6521 Severe sepsis with septic shock: Secondary | ICD-10-CM

## 2013-10-22 DIAGNOSIS — J189 Pneumonia, unspecified organism: Secondary | ICD-10-CM

## 2013-10-22 DIAGNOSIS — A419 Sepsis, unspecified organism: Secondary | ICD-10-CM

## 2013-10-22 DIAGNOSIS — D649 Anemia, unspecified: Secondary | ICD-10-CM

## 2013-10-22 DIAGNOSIS — R6520 Severe sepsis without septic shock: Secondary | ICD-10-CM

## 2013-10-22 DIAGNOSIS — N186 End stage renal disease: Secondary | ICD-10-CM

## 2013-10-22 LAB — TRANSFERRIN: Transferrin: 100 mg/dL — ABNORMAL LOW (ref 200–360)

## 2013-10-22 LAB — TYPE AND SCREEN
ABO/RH(D): O POS
ANTIBODY SCREEN: NEGATIVE
Unit division: 0

## 2013-10-22 LAB — BASIC METABOLIC PANEL
BUN: 40 mg/dL — ABNORMAL HIGH (ref 6–23)
CHLORIDE: 99 meq/L (ref 96–112)
CO2: 24 meq/L (ref 19–32)
CREATININE: 6.73 mg/dL — AB (ref 0.50–1.35)
Calcium: 8.8 mg/dL (ref 8.4–10.5)
GFR calc Af Amer: 8 mL/min — ABNORMAL LOW (ref >90)
GFR calc non Af Amer: 7 mL/min — ABNORMAL LOW (ref >90)
Glucose, Bld: 127 mg/dL — ABNORMAL HIGH (ref 70–99)
Potassium: 4.9 mEq/L (ref 3.7–5.3)
Sodium: 140 mEq/L (ref 137–147)

## 2013-10-22 LAB — CBC
HCT: 34.8 % — ABNORMAL LOW (ref 39.0–52.0)
Hemoglobin: 11.1 g/dL — ABNORMAL LOW (ref 13.0–17.0)
MCH: 28 pg (ref 26.0–34.0)
MCHC: 31.9 g/dL (ref 30.0–36.0)
MCV: 87.7 fL (ref 78.0–100.0)
Platelets: 183 10*3/uL (ref 150–400)
RBC: 3.97 MIL/uL — ABNORMAL LOW (ref 4.22–5.81)
RDW: 18.1 % — AB (ref 11.5–15.5)
WBC: 16.5 10*3/uL — ABNORMAL HIGH (ref 4.0–10.5)

## 2013-10-22 LAB — TROPONIN I
TROPONIN I: 6.85 ng/mL — AB (ref <0.30)
TROPONIN I: 4.96 ng/mL — AB (ref <0.30)
Troponin I: 2.43 ng/mL (ref <0.30)
Troponin I: 7.38 ng/mL (ref <0.30)

## 2013-10-22 LAB — PHOSPHORUS: Phosphorus: 5.2 mg/dL — ABNORMAL HIGH (ref 2.3–4.6)

## 2013-10-22 LAB — EXPECTORATED SPUTUM ASSESSMENT W REFEX TO RESP CULTURE: SPECIAL REQUESTS: NORMAL

## 2013-10-22 LAB — HEMOGLOBIN AND HEMATOCRIT, BLOOD
HEMATOCRIT: 33.4 % — AB (ref 39.0–52.0)
HEMOGLOBIN: 10.8 g/dL — AB (ref 13.0–17.0)

## 2013-10-22 LAB — IRON AND TIBC
Iron: 15 ug/dL — ABNORMAL LOW (ref 42–135)
SATURATION RATIOS: 11 % — AB (ref 20–55)
TIBC: 131 ug/dL — ABNORMAL LOW (ref 215–435)
UIBC: 116 ug/dL — AB (ref 125–400)

## 2013-10-22 LAB — GLUCOSE, CAPILLARY
GLUCOSE-CAPILLARY: 120 mg/dL — AB (ref 70–99)
Glucose-Capillary: 82 mg/dL (ref 70–99)
Glucose-Capillary: 105 mg/dL — ABNORMAL HIGH (ref 70–99)

## 2013-10-22 LAB — HAPTOGLOBIN: Haptoglobin: 168 mg/dL (ref 45–215)

## 2013-10-22 LAB — PROCALCITONIN

## 2013-10-22 LAB — FERRITIN: FERRITIN: 1540 ng/mL — AB (ref 22–322)

## 2013-10-22 MED ORDER — CITALOPRAM HYDROBROMIDE 20 MG PO TABS
20.0000 mg | ORAL_TABLET | Freq: Every day | ORAL | Status: DC
  Administered 2013-10-22 – 2013-10-26 (×5): 20 mg via ORAL
  Filled 2013-10-22 (×5): qty 1

## 2013-10-22 MED ORDER — SIMVASTATIN 10 MG PO TABS
10.0000 mg | ORAL_TABLET | Freq: Every day | ORAL | Status: DC
  Administered 2013-10-22 – 2013-10-26 (×4): 10 mg via ORAL
  Filled 2013-10-22 (×5): qty 1

## 2013-10-22 MED ORDER — RENA-VITE PO TABS
1.0000 | ORAL_TABLET | Freq: Every day | ORAL | Status: DC
Start: 2013-10-22 — End: 2013-10-26
  Administered 2013-10-22 – 2013-10-26 (×4): 1 via ORAL
  Filled 2013-10-22 (×6): qty 1

## 2013-10-22 MED ORDER — PANTOPRAZOLE SODIUM 20 MG PO TBEC
20.0000 mg | DELAYED_RELEASE_TABLET | Freq: Every day | ORAL | Status: DC
  Administered 2013-10-22 – 2013-10-26 (×5): 20 mg via ORAL
  Filled 2013-10-22 (×5): qty 1

## 2013-10-22 MED ORDER — CINACALCET HCL 30 MG PO TABS
60.0000 mg | ORAL_TABLET | Freq: Every day | ORAL | Status: DC
Start: 2013-10-22 — End: 2013-10-26
  Administered 2013-10-23 – 2013-10-26 (×4): 60 mg via ORAL
  Filled 2013-10-22 (×6): qty 2

## 2013-10-22 MED ORDER — CALCIUM ACETATE 667 MG PO CAPS
1334.0000 mg | ORAL_CAPSULE | Freq: Three times a day (TID) | ORAL | Status: DC
  Administered 2013-10-22 – 2013-10-26 (×10): 1334 mg via ORAL
  Filled 2013-10-22 (×15): qty 2

## 2013-10-22 MED ORDER — DOXERCALCIFEROL 4 MCG/2ML IV SOLN
10.0000 ug | INTRAVENOUS | Status: DC
  Administered 2013-10-25: 10 ug via INTRAVENOUS
  Filled 2013-10-22 (×2): qty 6

## 2013-10-22 MED ORDER — CALCIUM ACETATE 667 MG PO CAPS
667.0000 mg | ORAL_CAPSULE | Freq: Three times a day (TID) | ORAL | Status: DC
  Filled 2013-10-22 (×4): qty 1

## 2013-10-22 MED ORDER — OSELTAMIVIR PHOSPHATE 6 MG/ML PO SUSR: 30.0000 mg | ORAL | Status: DC

## 2013-10-22 MED ORDER — DARBEPOETIN ALFA-POLYSORBATE 60 MCG/0.3ML IJ SOLN
60.0000 ug | INTRAMUSCULAR | Status: DC
  Filled 2013-10-22: qty 0.3

## 2013-10-22 MED ORDER — HEPARIN SODIUM (PORCINE) 5000 UNIT/ML IJ SOLN
5000.0000 [IU] | Freq: Three times a day (TID) | INTRAMUSCULAR | Status: DC
Start: 2013-10-22 — End: 2013-10-23
  Administered 2013-10-22 – 2013-10-23 (×3): 5000 [IU] via SUBCUTANEOUS
  Filled 2013-10-22 (×6): qty 1

## 2013-10-22 NOTE — Progress Notes (Signed)
With continued elevation in troponin, would prefer to have graft de-clotted in IR.  Will discuss with them tonight.  Annamarie Major

## 2013-10-22 NOTE — Progress Notes (Signed)
Pt transferred to 2C17. CCMD notified.

## 2013-10-22 NOTE — Progress Notes (Signed)
Name: Brad Mcintosh MRN: [id number] DOB: August 10, 1935    ADMISSION DATE:  10/21/2013 CONSULTATION DATE:  10/21/13  REFERRING MD :  ED PRIMARY SERVICE: PCCM  CHIEF COMPLAINT:  Syncope w/ fever  BRIEF PATIENT DESCRIPTION: 79 yo AAM who was at HD today when he syncopized following HD. At that time he was found to have a temperature of 100.4. He vomited twice upon awakening, when standing up.  SIGNIFICANT EVENTS / STUDIES:  1/10 CXR ? Left basilar pneumonia, no pulmonary edema or cardiomegaly  LINES / TUBES: 1/10 RF a-line >>>  CULTURES: 1/10 Blood >>> 1/11 sputum >>> 1/11 Urine >>>  ANTIBIOTICS: 1/10 Cefepime >>> 1/10 (1 dose in ED) 1/10 Vancomycin >>> 1/10 Zosyn >>> 1/10 Azithro >>>  HISTORY OF PRESENT ILLNESS:  Patient says he feels much better today, denies any chest pain, SOB, nausea, vomiting, dizziness, or lightheadedness. Admission troponin of 2.43, repeat this AM 6.85. Procalcitonin >175.0. Leukocytosis of 16.5 this AM, increased from 5.0 yesterday. Lactic acid 1.9. Hb stable at 10.8. Received 1 unit of PRBC's in the ED for Hb of 5.2 (likely false value).  SUBJECTIVE:   VITAL SIGNS: Temp:  [98.1 F (36.7 C)-101.8 F (38.8 C)] 98.1 F (36.7 C) (01/11 0400) Pulse Rate:  [25-131] 76 (01/11 0700) Resp:  [17-47] 19 (01/11 0600) BP: (55-133)/(38-108) 124/65 mmHg (01/11 0600) SpO2:  [62 %-100 %] 94 % (01/11 0700) Arterial Line BP: (84-129)/(42-66) 120/56 mmHg (01/11 0700) Weight:  [189 lb 9.5 oz (86 kg)] 189 lb 9.5 oz (86 kg) (01/10 2300) HEMODYNAMICS:   VENTILATOR SETTINGS:   INTAKE / OUTPUT: Intake/Output     01/10 0701 - 01/11 0700 01/11 0701 - 01/12 0700   I.V. (mL/kg) 150 (1.7)    Blood 325    IV Piggyback 400    Total Intake(mL/kg) 875 (10.2)    Net +875            PHYSICAL EXAMINATION: General:  Awake, alert, in NAD Neuro:  A&O x3. No focal sensory or motor deficits. CN's generally intact.  HEENT:  Conjunctivitis present, PERRL, EOMI, no scleral  edema Cardiovascular:  RRR, no murmurs, gallops or rubs. Lungs:  CTAB, no wheezes, crackles or rales. Abdomen:  Soft, non-tender, non-distended, BS + Musculoskeletal:  ROM intact, no myalgias, no edema. Skin:  No rashes, sores, or skin breakdown  LABS:  CBC  Recent Labs Lab 10/21/13 1644 10/21/13 2230 10/22/13 0245  WBC 5.0  --  16.5*  HGB 5.2* 11.6* 11.1*  HCT 16.4* 36.0* 34.8*  PLT 100*  --  183   Coag's  Recent Labs Lab 10/21/13 2230  APTT 30  INR 1.21   BMET  Recent Labs Lab 10/21/13 1644  NA 143  K 3.0*  CL 105  CO2 22  BUN 24*  CREATININE 4.49*  GLUCOSE 87   Electrolytes  Recent Labs Lab 10/21/13 1644  CALCIUM 6.8*   Sepsis Markers  Recent Labs Lab 10/21/13 1708 10/21/13 2230 10/22/13 0504  LATICACIDVEN 3.00* 1.9  --   PROCALCITON  --  142.55 >175.00   ABG  Recent Labs Lab 10/21/13 1753  PHART 7.445  PCO2ART 37.7  PO2ART 35.0*   Liver Enzymes  Recent Labs Lab 10/21/13 1644  AST 7  ALT <5  ALKPHOS 25*  BILITOT 0.4  ALBUMIN 2.1*   Cardiac Enzymes  Recent Labs Lab 10/21/13 2359 10/22/13 0503  TROPONINI 2.43* 6.85*   Glucose  Recent Labs Lab 10/21/13 1627 10/21/13 2149  GLUCAP 102* 110*  Imaging Dg Chest Portable 1 View  10/21/2013   CLINICAL DATA:  Status post central line placement  EXAM: PORTABLE CHEST - 1 VIEW  COMPARISON:  10/21/2013  FINDINGS: Cardiac shadow is stable. Aortic calcifications are again seen. Left basilar infiltrate is again noted. The right lung remains clear. There is been placement of a right-sided central venous line although it is coiled within the neck. Is difficult to assess whether it lies completely within the vein or within the adjacent soft tissues. Clinical correlation as to blood withdrawal is recommended.  IMPRESSION: Status post central line placement coiled within the right neck. Correlation with the ability to aspirate blood is recommended.   Electronically Signed   By: Inez Catalina M.D.   On: 10/21/2013 20:28   Dg Chest Port 1 View  10/21/2013   CLINICAL DATA:  Loss of consciousness  EXAM: PORTABLE CHEST - 1 VIEW  COMPARISON:  11/16/2012  FINDINGS: Cardiac shadow is mildly enlarged. The lungs are well aerated with diffuse increased density in the left mid and lower lung consistent with acute pneumonia. No bony abnormality is noted.  IMPRESSION: Left basilar pneumonia.   Electronically Signed   By: Inez Catalina M.D.   On: 10/21/2013 17:08   CXR: Patient with questionable left lobe pneumonia   ASSESSMENT / PLAN:   PULMONARY  A: Sepsis on admission likely 2/2 LLL pneumonia. Patient ESRD with Outpatient HD, therefore HCAP. No hx of recent hospitalization  P:  -Flu/respiratory panel pending. -Blood/respiratory cultures pending. -Trend procalcitonin, lactic acid. -Tamiflu until flu screen negative.  -Abx; see ID.  CARDIOVASCULAR  A: Troponin elevation on admission; 2.43. Further increased this morning to 6.85. Patient reports on chest pain. Likely 2/2 demand ischemia. Patient is TIMI 2. Hypotensive on admission, since resolved. P:  -Continue to trend troponin. -EKG unchanged from previous. -ASA given on admission. No Heparin gtt at this time given absence of angina. -Hold Anti-HTN meds at this time (Toprol-XL). -Continue Zocor  RENAL  A: ESRD; Cr 6.73 this AM, BUN 40.  No current electrolyte abnormalities  P:  -Nephrology consulted. Patient HD TTS.  GASTROINTESTINAL  A: Melena? with Hb of 5.2 on admission. FOBT -ve in ED.  No abdominal pain or other signs of acute abdomin/peritonitis. P:  -[facility] consulted on admission, feel that intervention is not necessary, doubt GIB at this time. -Protonix 20 mg po qd. -Serial H/H with goals of transfusion being >7. Hb 11.1 --> 10.8 this AM.  HEMATOLOGIC  A: Anemia and thrombocytopenia; improved this AM.  Recent Labs Lab 10/21/13 1644 10/21/13 2230 10/22/13 0245 10/22/13 0700  HGB 5.2* 11.6* 11.1*  10.8*  HCT 16.4* 36.0* 34.8* 33.4*  WBC 5.0  --  16.5*  --   PLT 100*  --  183  --   P:  -Continue to monitor. -Transfuse < 7 Hb.  INFECTIOUS  A: Sepsis likely 2/2 pneumonia. Leukocytosis this AM. Patient w/out complaints. P:  -CXR shows slightly improved left lung airspace disease/pneumonia this AM. -Continue abx; Vancomycin, Zosyn, Azithro -Continue to trend procalcitonin, lactic acid. Most recent >175, 1.9, respectively.  ENDOCRINE  A: Hypothyroidism  P:  -Restarted diet.   NEUROLOGIC  A: none P:  -Continue to monitor.  TODAY'S SUMMARY: Patient w/ stable BP, Hb, 10.8, increasing troponin. Patient claims he is feeling very well today. Patient likely w/ pneumonia resulting in severe sepsis on admission, w/ a resulting demand ischemia. Gi consulted for low Hb on admission, now 10.8, do not suspect GIB at  this time. Patient to be transferred to [facility].   Signed: Luanne Bras, MD 10/22/2013 8:03 AM  Reviewed above, examined pt, and agree with assessment/plan.  Has PNA, but respiratory status stable on supplemental oxygen.  Has elevated troponin >> likely NSTEMI from demand ischemia in setting of hypoxia, hypotension, and anemia >> defer cardiology evaluation for now.  Has ? Of GI bleed >> appreciate help from GI.  Will advance diet.  Renal consulted to arrange for routine HD.  Will transfer to telemetry.  Will transfer service to [facility] for 1/12 and PCCM sign off.  Chesley Mires, MD [address] [facility] Pulmonary/Critical Care 10/22/2013, 3:39 PM Pager:  [phone] After 3pm call: [phone]

## 2013-10-22 NOTE — Consult Note (Signed)
Referring Provider:  PCCM Primary Care Physician:  [name], MD Primary Gastroenterologist:  ? Althia Forts; Looks like Dr. Carlean Purl only saw patient in hospital in 2011.  Reason for Consultation:  Anemia; report of black stools  HPI: Brad Mcintosh is a 79 y.o. male who was at HD yesterday when he had an episode of syncope. At that time he was found to have a temperature of 100.4. He vomited twice (non-bloody) upon awakening, when standing up.   He was brought to the ED at [facility] and was found to have a Hgb of 5.2 grams.  Chest x-ray showed left lung PNA.  Had elevated troponin >2, which is thought to be secondary to demand ischemia.  No complaints of chest pain, etc.  Received one unit PRBC's and Hgb increased to 11.6 grams.  It is 10.8 grams this AM.  He was initially placed on a PPI gtt but that has now been discontinued and changed to once daily.  Patient reports black runny stools at home (not thick and tarry) for the past couple of weeks.  No BM since he had been here and he has been heme negative x 2 on rectal exams.  Denies any nausea, vomiting, bloody stool, abdominal pain, poor appetite, weight loss, heartburn/reflux, or issues with dysphagia at home.  It is listed that he takes Dexilant in the morning and prilosec at bedtime at home but when I asked him he said that he did not take anything for heartburn/reflux anymore.  Denies NSAID use.  He is hungry and wants to eat.   EGD 10/2009 by Dr. Carlean Purl showed the following:  1) 8 - 10 mm sessile polyp in the body of the stomach, with heme and erosion present (snared off); path showed polypoid inflamed reactive gastric mucosa without Hpylori  2) Nodular mucosa in the body of the stomach, with erosions   Colonoscopy at the same time showed the following:  1) Two 37mm polyps removed (hyperplastic polyps) 2) Moderate diverticulosis in the sigmoid colon  3) Internal hemorrhoids   Past Medical History  Diagnosis Date  . Hypertension    . Hyperlipidemia   . Cancer     prostate  . Gout   . Anemia     Iron deficiency  . Thyroid disease   . Malnutrition   . Atrial fibrillation     paroxysmal  . Chronic kidney disease     Pt has HD on TUESDAY, THURSDAY and SATURDAY  at [facility]  . Peripheral vascular disease     Past Surgical History  Procedure Laterality Date  . Av fistula placement  06/2006    left forearm  . Amputation      right finger  . Incise and drain abcess      chronic complex infection right long finger  . Insertion of dialysis catheter  05/30/2010    right internal jugular Palindrome catheter  . Aortogram  06/01/11    w/ runoff    Prior to Admission medications   Medication Sig Start Date End Date Taking? Authorizing Provider  calcium acetate (PHOSLO) 667 MG capsule Take 667 mg by mouth 3 (three) times daily with meals.  06/20/12  Yes Historical Provider, MD  cinacalcet (SENSIPAR) 60 MG tablet Take 60 mg by mouth daily. Takes 1 tablet with evening meal   Yes Historical Provider, MD  citalopram (CELEXA) 20 MG tablet Take 20 mg by mouth at bedtime. Depression   Yes Historical Provider, MD  cyclobenzaprine (FLEXERIL) 10  MG tablet Take one at bed time for back pain and spasms 06/02/13  Yes Orma Flaming, MD  dexlansoprazole (DEXILANT) 60 MG capsule Take 60 mg by mouth daily.   Yes Historical Provider, MD  gabapentin (NEURONTIN) 100 MG capsule Take 100 mg by mouth 2 (two) times daily.  06/16/11  Yes Historical Provider, MD  HYDROcodone-acetaminophen (NORCO) 5-325 MG per tablet Take 1 tablet by mouth at bedtime as needed. pain   Yes Historical Provider, MD  metoprolol (TOPROL-XL) 100 MG 24 hr tablet Take 100 mg by mouth daily.     Yes Historical Provider, MD  omeprazole (PRILOSEC) 20 MG capsule Take 1 capsule by mouth at bedtime.  10/13/12  Yes Historical Provider, MD  simvastatin (ZOCOR) 10 MG tablet Take 10 mg by mouth at bedtime.   Yes Historical Provider, MD  traMADol (ULTRAM) 50 MG tablet Take 50 mg by  mouth every 6 (six) hours as needed for moderate pain.  05/09/13  Yes Historical Provider, MD  acetaminophen (TYLENOL) 160 MG/5ML suspension Take 1,000 mg by mouth every 6 (six) hours as needed.    Historical Provider, MD  ondansetron (ZOFRAN-ODT) 4 MG disintegrating tablet Take 4 mg by mouth every 8 (eight) hours as needed for nausea or vomiting.    Historical Provider, MD    Current Facility-Administered Medications  Medication Dose Route Frequency Provider Last Rate Last Dose  . 0.9 %  sodium chloride infusion  250 mL Intravenous PRN Raynelle Jan, MD      . azithromycin [facility]) 500 mg in dextrose 5 % 250 mL IVPB  500 mg Intravenous Q24H Raynelle Jan, MD   500 mg at 10/21/13 2336  . calcium acetate (PHOSLO) capsule 667 mg  667 mg Oral TID WC Raynelle Jan, MD      . cinacalcet [facility]) tablet 60 mg  60 mg Oral Q breakfast Raynelle Jan, MD      . citalopram (CELEXA) tablet 20 mg  20 mg Oral Daily Raynelle Jan, MD      . heparin injection 5,000 Units  5,000 Units Subcutaneous Q8H Raynelle Jan, MD      . ondansetron [facility]) injection 4 mg  4 mg Intravenous Q8H PRN Raynelle Jan, MD      . Derrill Memo ON 10/23/2013] oseltamivir (TAMIFLU) 6 MG/ML suspension 30 mg  30 mg Oral Q M,W,F-2000 Raynelle Jan, MD      . pantoprazole (PROTONIX) EC tablet 20 mg  20 mg Oral Daily Raynelle Jan, MD      . piperacillin-tazobactam (ZOSYN) IVPB 2.25 g  2.25 g Intravenous Q8H Darnell Level Mancheril, RPH   2.25 g at 10/22/13 [id number]  . simvastatin (ZOCOR) tablet 10 mg  10 mg Oral q1800 Raynelle Jan, MD      . Derrill Memo ON 10/24/2013] vancomycin (VANCOCIN) IVPB 1000 mg/200 mL premix  1,000 mg Intravenous Q T,Th,Sa-HD Kendra P Hiatt, RPH        Allergies as of 10/21/2013  . (No Known Allergies)    Family History  Problem Relation Age of Onset  . Hypertension Mother   . Asthma Father   . Cancer Sister     breast  . Kidney disease Brother   . Diabetes Brother     History   Social History  . Marital Status: Widowed     Spouse Name: N/A    Number of Children: N/A  . Years of Education: N/A   Occupational History  . Not  on file.   Social History Main Topics  . Smoking status: Former Smoker -- 0.50 packs/day    Types: Cigarettes    Quit date: 02/17/1972  . Smokeless tobacco: Never Used  . Alcohol Use: No  . Drug Use: No  . Sexual Activity: Not on file   Other Topics Concern  . Not on file   Social History Narrative  . No narrative on file    Review of Systems: Ten point ROS is O/W negative except as mentioned in HPI.  Physical Exam: Vital signs in last 24 hours: Temp:  [97.5 F (36.4 C)-101.8 F (38.8 C)] 97.5 F (36.4 C) (01/11 0700) Pulse Rate:  [25-131] 76 (01/11 0700) Resp:  [17-47] 19 (01/11 0600) BP: (55-133)/(38-108) 124/65 mmHg (01/11 0600) SpO2:  [62 %-100 %] 94 % (01/11 0700) Arterial Line BP: (84-129)/(42-66) 120/56 mmHg (01/11 0700) Weight:  [189 lb 9.5 oz (86 kg)] 189 lb 9.5 oz (86 kg) (01/10 2300)   General:   Alert, Well-developed, well-nourished, pleasant and cooperative in NAD Head:  Normocephalic and atraumatic. Eyes:  Sclera clear, no icterus.  Conjunctiva pink. Ears:  Normal auditory acuity. Mouth:  No deformity or lesions.   Lungs:  Clear throughout to auscultation. Heart:  Regular rate and rhythm; SEM noted Abdomen:  Soft, non-distended.  BS present.  Non-tender.   Rectal:  Deferred  Msk:  Symmetrical without gross deformities. Pulses:  Normal pulses noted. Extremities:  Without clubbing or edema. Neurologic:  Alert and  oriented x4;  grossly normal neurologically. Skin:  Intact without significant lesions or rashes. Psych:  Alert and cooperative. Normal mood and affect.  Intake/Output from previous day: 01/10 0701 - 01/11 0700 In: 875 [I.V.:150; Blood:325; IV Piggyback:400] Out: -   Lab Results:  Recent Labs  10/21/13 1644 10/21/13 2230 10/22/13 0245 10/22/13 0700  WBC 5.0  --  16.5*  --   HGB 5.2* 11.6* 11.1* 10.8*  HCT 16.4* 36.0* 34.8*  33.4*  PLT 100*  --  183  --    BMET  Recent Labs  10/21/13 1644 10/22/13 0504  NA 143 140  K 3.0* 4.9  CL 105 99  CO2 22 24  GLUCOSE 87 127*  BUN 24* 40*  CREATININE 4.49* 6.73*  CALCIUM 6.8* 8.8   LFT  Recent Labs  10/21/13 1644  PROT 5.4*  ALBUMIN 2.1*  AST 7  ALT <5  ALKPHOS 25*  BILITOT 0.4   PT/INR  Recent Labs  10/21/13 2230  LABPROT 15.0  INR 1.21   Studies/Results: Dg Chest Port 1 View  10/22/2013   CLINICAL DATA:  Fever and sepsis.  EXAM: PORTABLE CHEST - 1 VIEW  COMPARISON:  10/21/2013 and prior chest radiographs  FINDINGS: Cardiomegaly again noted.  Mid-lower lung airspace opacities slightly improved.  No new airspace disease, pulmonary edema or pneumothorax noted.  There is no evidence pleural effusion.  IMPRESSION: Slightly improved left lung airspace disease/pneumonia.   Electronically Signed   By: Hassan Rowan M.D.   On: 10/22/2013 08:11   Dg Chest Portable 1 View  10/21/2013   CLINICAL DATA:  Status post central line placement  EXAM: PORTABLE CHEST - 1 VIEW  COMPARISON:  10/21/2013  FINDINGS: Cardiac shadow is stable. Aortic calcifications are again seen. Left basilar infiltrate is again noted. The right lung remains clear. There is been placement of a right-sided central venous line although it is coiled within the neck. Is difficult to assess whether it lies completely within the vein or within the  adjacent soft tissues. Clinical correlation as to blood withdrawal is recommended.  IMPRESSION: Status post central line placement coiled within the right neck. Correlation with the ability to aspirate blood is recommended.   Electronically Signed   By: Inez Catalina M.D.   On: 10/21/2013 20:28   Dg Chest Port 1 View  10/21/2013   CLINICAL DATA:  Loss of consciousness  EXAM: PORTABLE CHEST - 1 VIEW  COMPARISON:  11/16/2012  FINDINGS: Cardiac shadow is mildly enlarged. The lungs are well aerated with diffuse increased density in the left mid and lower lung  consistent with acute pneumonia. No bony abnormality is noted.  IMPRESSION: Left basilar pneumonia.   Electronically Signed   By: Inez Catalina M.D.   On: 10/21/2013 17:08    IMPRESSION:  -Anemia (normocytic) with Hgb of 5.2 grams on admission.  Increased to 11.6 grams after just one unit of PRBC's.  Reports of dark stools at home, but heme negative x 2 here. -PNA:  Has leukocytosis this AM.  On abx. -Elevated troponins:  Thought to be demand ischemia. -ESRD on HD TTS -Syncope:  Likely a combination of the above listed problems. -History of sessile polyp with erosion in the stomach:  Was removed during EGD 10/2009.  PLAN: -Monitor Hgb and transfuse further if needed. -Await pending iron studies. -Continue empiric treatment with daily PPI for now. -No GI procedure unless Hgb continues to decrease or sign of overt GIB.   ZEHR, JESSICA D.  10/22/2013, 8:44 AM  Pager number [phone]    ________________________________________________________________________  Velora Heckler GI MD note:  I personally examined the patient, reviewed the data and agree with the assessment and plan described above.  Not clear that he is having GI bleeding given repeated FOBT neg stools.  His Hb of 5 result is also in doubt given the much better than anticipated bump with a single unit. He's had cardiac insult.  Has PNA, fever,WBC.  No plan for invasive GI testing at this point.  Please call or page if he has more clear, overt GI bleeding.    Owens Loffler, MD [facility] Pager [phone]

## 2013-10-22 NOTE — Progress Notes (Signed)
Attempted to give report to RN receiving patient in 2c17. RN unavailable at this time. Will call back later. Velora Mediate

## 2013-10-22 NOTE — Progress Notes (Signed)
Point of Care CCM Note:  Patient with elevated troponin of 2.43 in the setting of tachycardia in the >110, hypotension, and severe anemia in a patient with ESRD.  Patient without complaints of chest pain, sob, however had transient nausea and vomiting at presentation.   EKG without evidence of current myocardial ischemia .  Patient without chest pain, resolution of nausea, and states he feels good without any sob, jaw pain, arm pain, or dizziness when questioned 5 minutes ago.    Patient's hemoglobin is  11.6 following 1 unit of PRBC.  Repeating H/H serially x3 Troponins Q6  Lactic acidosis resolved.   Procalcitonin 142  Likely type II MI (demand ischemia) and not NSTEMI/STEMI ACS.  Continue supportive care and treatment of underlying issues.  Critical Care time 15 minutes  Ernestina Patches, MD Pager [phone]

## 2013-10-22 NOTE — Consult Note (Signed)
Brad Mcintosh is an 79 y.o. male referred by Dr Halford Chessman   Chief Complaint: ESRD, Sec HPTH, Anemia HPI: 79yo BM with ESRD on HD at [facility] admitted yest for ? Syncope. He tells me that he was sent for n/v.  Feels well today.  CXR showed LLL PNA.  He had a full HD yest.  TTS, 4hr, BFR 400, DW 84.5 profile 4.  Past Medical History  Diagnosis Date  . Hypertension   . Hyperlipidemia   . Cancer     prostate  . Gout   . Anemia     Iron deficiency  . Thyroid disease   . Malnutrition   . Atrial fibrillation     paroxysmal  . Chronic kidney disease     Pt has HD on TUESDAY, THURSDAY and SATURDAY  at [facility]  . Peripheral vascular disease     Past Surgical History  Procedure Laterality Date  . Av fistula placement  06/2006    left forearm  . Amputation      right finger  . Incise and drain abcess      chronic complex infection right long finger  . Insertion of dialysis catheter  05/30/2010    right internal jugular Palindrome catheter  . Aortogram  06/01/11    w/ runoff    Family History  Problem Relation Age of Onset  . Hypertension Mother   . Asthma Father   . Cancer Sister     breast  . Kidney disease Brother   . Diabetes Brother    Social History:  reports that he quit smoking about 41 years ago. His smoking use included Cigarettes. He smoked 0.50 packs per day. He has never used smokeless tobacco. He reports that he does not drink alcohol or use illicit drugs.  Allergies: No Known Allergies  Medications Prior to Admission  Medication Sig Dispense Refill  . calcium acetate (PHOSLO) 667 MG capsule Take 667 mg by mouth 3 (three) times daily with meals.       . cinacalcet (SENSIPAR) 60 MG tablet Take 60 mg by mouth daily. Takes 1 tablet with evening meal      . citalopram (CELEXA) 20 MG tablet Take 20 mg by mouth at bedtime. Depression      . cyclobenzaprine (FLEXERIL) 10 MG tablet Take one at bed time for back pain and spasms  30 tablet  0  . dexlansoprazole (DEXILANT) 60 MG  capsule Take 60 mg by mouth daily.      Marland Kitchen gabapentin (NEURONTIN) 100 MG capsule Take 100 mg by mouth 2 (two) times daily.       Marland Kitchen HYDROcodone-acetaminophen (NORCO) 5-325 MG per tablet Take 1 tablet by mouth at bedtime as needed. pain      . metoprolol (TOPROL-XL) 100 MG 24 hr tablet Take 100 mg by mouth daily.        Marland Kitchen omeprazole (PRILOSEC) 20 MG capsule Take 1 capsule by mouth at bedtime.       . simvastatin (ZOCOR) 10 MG tablet Take 10 mg by mouth at bedtime.      . traMADol (ULTRAM) 50 MG tablet Take 50 mg by mouth every 6 (six) hours as needed for moderate pain.       Marland Kitchen acetaminophen (TYLENOL) 160 MG/5ML suspension Take 1,000 mg by mouth every 6 (six) hours as needed.      . ondansetron (ZOFRAN-ODT) 4 MG disintegrating tablet Take 4 mg by mouth every 8 (eight) hours as needed for nausea  or vomiting.         Lab Results: UA: ND   Recent Labs  10/21/13 1644 10/21/13 2230 10/22/13 0245 10/22/13 0700  WBC 5.0  --  16.5*  --   HGB 5.2* 11.6* 11.1* 10.8*  HCT 16.4* 36.0* 34.8* 33.4*  PLT 100*  --  183  --    BMET  Recent Labs  10/21/13 1644 10/22/13 0504  NA 143 140  K 3.0* 4.9  CL 105 99  CO2 22 24  GLUCOSE 87 127*  BUN 24* 40*  CREATININE 4.49* 6.73*  CALCIUM 6.8* 8.8   LFT  Recent Labs  10/21/13 1644  PROT 5.4*  ALBUMIN 2.1*  AST 7  ALT <5  ALKPHOS 25*  BILITOT 0.4   Dg Chest Port 1 View  10/22/2013   CLINICAL DATA:  Fever and sepsis.  EXAM: PORTABLE CHEST - 1 VIEW  COMPARISON:  10/21/2013 and prior chest radiographs  FINDINGS: Cardiomegaly again noted.  Mid-lower lung airspace opacities slightly improved.  No new airspace disease, pulmonary edema or pneumothorax noted.  There is no evidence pleural effusion.  IMPRESSION: Slightly improved left lung airspace disease/pneumonia.   Electronically Signed   By: Hassan Rowan M.D.   On: 10/22/2013 08:11   Dg Chest Portable 1 View  10/21/2013   CLINICAL DATA:  Status post central line placement  EXAM: PORTABLE CHEST - 1  VIEW  COMPARISON:  10/21/2013  FINDINGS: Cardiac shadow is stable. Aortic calcifications are again seen. Left basilar infiltrate is again noted. The right lung remains clear. There is been placement of a right-sided central venous line although it is coiled within the neck. Is difficult to assess whether it lies completely within the vein or within the adjacent soft tissues. Clinical correlation as to blood withdrawal is recommended.  IMPRESSION: Status post central line placement coiled within the right neck. Correlation with the ability to aspirate blood is recommended.   Electronically Signed   By: Inez Catalina M.D.   On: 10/21/2013 20:28   Dg Chest Port 1 View  10/21/2013   CLINICAL DATA:  Loss of consciousness  EXAM: PORTABLE CHEST - 1 VIEW  COMPARISON:  11/16/2012  FINDINGS: Cardiac shadow is mildly enlarged. The lungs are well aerated with diffuse increased density in the left mid and lower lung consistent with acute pneumonia. No bony abnormality is noted.  IMPRESSION: Left basilar pneumonia.   Electronically Signed   By: Inez Catalina M.D.   On: 10/21/2013 17:08    ROS: No change in vision No CP No sob No Abd pain No new arthritic Co No new neuropathic CO, chronic numbness in feet  PHYSICAL EXAM: Blood pressure 120/76, pulse 77, temperature 97.5 F (36.4 C), temperature source Oral, resp. rate 19, height 5\' 11"  (1.803 m), weight 86 kg (189 lb 9.5 oz), SpO2 93.00%. HEENT: PERRLA, EOMI NECK:No JVD LUNGS:clear CARDIAC:RRR wo MRG ABD:+ BS NTND No HSM EXT:no edema  LUA AVG no bruit NEURO:CNI ox3 no asterixis  Assessment: 1. LLL PNA 2. ESRD 3. Anemia on EPO 4. Sec HPTH on sensipar and hectorol 5.  Clotted dialysis access PLAN: 1. HD on Tuesday 2. Resume epo 3. Resume hectorol 4. Start renavite 5. Check PO4 6. Increase phoslo 2 AC 7. Page in to [facility] to set up for declot   Brad Mcintosh T 10/22/2013, 12:34 PM

## 2013-10-22 NOTE — Procedures (Signed)
Name: ARJAN STROHM MRN: [id number] DOB: September 19, 1935  DOS:  PROCEDURE NOTE  Procedure:  Arterial catheter placement.  Indications:  Need for invasive hemodynamic monitoring Consent:  Consent was implied due to the emergency nature of the procedure.  Patient with systolic's in the 71'Q and HR >110 with possible GI bleed  Procedure summary:  The patient was identified as Brad Mcintosh and safety timeout was performed. Collateral arterial flow was confirmed by performing Allen's test. Ultrasound of right radial artery showed diameter <3 mm.  Left radial not a candidate due to fistula.  Right groin was palpated and localized.  Sterile technique was used. The patient's right groin was prepped using chlorhexidine and the field was draped in usual sterile fashion with protective barrier. The right femoral was cannulated without difficulty on the first attempt. Blood was aspirated and the catheter was flushed with normal saline without difficulty. Good arterial waveform was obtained. The catheter was secured into place with sterile dressing.  Complications:  No immediate complications were noted.  Estimated blood loss:  Less then 5 mL  Penne Lash, M.D. [facility] [place] Cell: [phone] Pager: [phone]  10/22/2013, 1:49 AM

## 2013-10-22 NOTE — Progress Notes (Signed)
Point of Contact CCM Note:  Patient resting comfortably, no chest pain, hemodynamic instability, or EKG changes  Trending upward troponin At this point, difficult to determine if just type II MI, however patient without any signs or symptoms of cardiac ischemia Patient also with fever, possible pna, leukocytosis, and elevated procalcitionin No evidence of GI bleed Will discuss with oncoming team  Ernestina Patches, MD Pager [phone]

## 2013-10-22 NOTE — Progress Notes (Signed)
CRITICAL VALUE ALERT  Critical value received:  Trop 2.43  Date of notification:  10/22/13  Time of notification:  0040  Critical value read back:yes  Nurse who received alert:  A.Ezequiel Ganser RN  MD notified (1st page):  Marijean Bravo MD    Time of first page:  [phone]  MD notified (2nd page):  Time of second page:  Responding MD:  Marijean Bravo MD  Time MD responded:  [phone]  No new orders at this time.

## 2013-10-22 NOTE — Progress Notes (Signed)
Point of Contact CCM Note:  Patient with hemoglobin 11.6 from 5.2 following 1 unit of PRBC Hemoccult negative x2  No evidence of bleeding.    Will discontinue PPI gtt Will need to call GI in AM and cancel consult  J. Gaspar Cola, MD Pager [phone]

## 2013-10-23 ENCOUNTER — Encounter (HOSPITAL_COMMUNITY): Payer: Self-pay | Admitting: Radiology

## 2013-10-23 DIAGNOSIS — K921 Melena: Secondary | ICD-10-CM | POA: Diagnosis present

## 2013-10-23 DIAGNOSIS — J9601 Acute respiratory failure with hypoxia: Secondary | ICD-10-CM | POA: Diagnosis present

## 2013-10-23 DIAGNOSIS — I248 Other forms of acute ischemic heart disease: Secondary | ICD-10-CM | POA: Diagnosis present

## 2013-10-23 DIAGNOSIS — N189 Chronic kidney disease, unspecified: Secondary | ICD-10-CM

## 2013-10-23 DIAGNOSIS — J181 Lobar pneumonia, unspecified organism: Secondary | ICD-10-CM

## 2013-10-23 DIAGNOSIS — D631 Anemia in chronic kidney disease: Secondary | ICD-10-CM | POA: Diagnosis present

## 2013-10-23 DIAGNOSIS — E039 Hypothyroidism, unspecified: Secondary | ICD-10-CM | POA: Diagnosis present

## 2013-10-23 DIAGNOSIS — R011 Cardiac murmur, unspecified: Secondary | ICD-10-CM | POA: Diagnosis present

## 2013-10-23 DIAGNOSIS — J189 Pneumonia, unspecified organism: Secondary | ICD-10-CM | POA: Diagnosis present

## 2013-10-23 LAB — BASIC METABOLIC PANEL
BUN: 60 mg/dL — ABNORMAL HIGH (ref 6–23)
CO2: 24 mEq/L (ref 19–32)
Calcium: 9.7 mg/dL (ref 8.4–10.5)
Chloride: 95 mEq/L — ABNORMAL LOW (ref 96–112)
Creatinine, Ser: 8.47 mg/dL — ABNORMAL HIGH (ref 0.50–1.35)
GFR calc Af Amer: 6 mL/min — ABNORMAL LOW (ref >90)
GFR calc non Af Amer: 5 mL/min — ABNORMAL LOW (ref >90)
Glucose, Bld: 92 mg/dL (ref 70–99)
Potassium: 4.3 mEq/L (ref 3.7–5.3)
Sodium: 135 mEq/L — ABNORMAL LOW (ref 137–147)

## 2013-10-23 LAB — CBC
HCT: 31.3 % — ABNORMAL LOW (ref 39.0–52.0)
Hemoglobin: 10.2 g/dL — ABNORMAL LOW (ref 13.0–17.0)
MCH: 28.4 pg (ref 26.0–34.0)
MCHC: 32.6 g/dL (ref 30.0–36.0)
MCV: 87.2 fL (ref 78.0–100.0)
Platelets: 179 10*3/uL (ref 150–400)
RBC: 3.59 MIL/uL — ABNORMAL LOW (ref 4.22–5.81)
RDW: 18.4 % — AB (ref 11.5–15.5)
WBC: 13.1 10*3/uL — ABNORMAL HIGH (ref 4.0–10.5)

## 2013-10-23 LAB — INFLUENZA PANEL BY PCR (TYPE A & B)
H1N1 flu by pcr: NOT DETECTED
INFLAPCR: NEGATIVE
Influenza B By PCR: NEGATIVE

## 2013-10-23 LAB — GLUCOSE, CAPILLARY
GLUCOSE-CAPILLARY: 93 mg/dL (ref 70–99)
Glucose-Capillary: 78 mg/dL (ref 70–99)
Glucose-Capillary: 116 mg/dL — ABNORMAL HIGH (ref 70–99)

## 2013-10-23 LAB — RESPIRATORY VIRUS PANEL
Adenovirus: NOT DETECTED
INFLUENZA A: NOT DETECTED
Influenza A H1: NOT DETECTED
Influenza A H3: NOT DETECTED
Influenza B: NOT DETECTED
Metapneumovirus: NOT DETECTED
PARAINFLUENZA 2 A: NOT DETECTED
Parainfluenza 1: NOT DETECTED
Parainfluenza 3: NOT DETECTED
Respiratory Syncytial Virus A: NOT DETECTED
Respiratory Syncytial Virus B: NOT DETECTED
Rhinovirus: NOT DETECTED

## 2013-10-23 LAB — PROCALCITONIN: Procalcitonin: >175 ng/mL

## 2013-10-23 LAB — TROPONIN I: Troponin I: 3.9 ng/mL (ref <0.30)

## 2013-10-23 LAB — LACTIC ACID, PLASMA: LACTIC ACID, VENOUS: 1.2 mmol/L (ref 0.5–2.2)

## 2013-10-23 LAB — TSH: TSH: 1.73 u[IU]/mL (ref 0.350–4.500)

## 2013-10-23 MED ORDER — HEPARIN SODIUM (PORCINE) 5000 UNIT/ML IJ SOLN
5000.0000 [IU] | Freq: Three times a day (TID) | INTRAMUSCULAR | Status: DC
  Administered 2013-10-23 – 2013-10-26 (×8): 5000 [IU] via SUBCUTANEOUS
  Filled 2013-10-23 (×12): qty 1

## 2013-10-23 MED ORDER — HEPARIN SODIUM (PORCINE) 5000 UNIT/ML IJ SOLN
5000.0000 [IU] | Freq: Three times a day (TID) | INTRAMUSCULAR | Status: DC
  Filled 2013-10-23 (×3): qty 1

## 2013-10-23 MED ORDER — HYDROCODONE-ACETAMINOPHEN 5-325 MG PO TABS
1.0000 | ORAL_TABLET | Freq: Once | ORAL | Status: AC
  Administered 2013-10-23: 1 via ORAL
  Filled 2013-10-23: qty 1

## 2013-10-23 MED ORDER — HEPARIN SODIUM (PORCINE) 5000 UNIT/ML IJ SOLN
5000.0000 [IU] | Freq: Three times a day (TID) | INTRAMUSCULAR | Status: DC
Start: 2013-10-23 — End: 2013-10-23
  Filled 2013-10-23 (×3): qty 1

## 2013-10-23 MED ORDER — ACETAMINOPHEN 325 MG PO TABS: 650.0000 mg | ORAL_TABLET | Freq: Four times a day (QID) | ORAL | Status: DC | PRN

## 2013-10-23 MED ORDER — HYDROCODONE-ACETAMINOPHEN 5-325 MG PO TABS
1.0000 | ORAL_TABLET | Freq: Four times a day (QID) | ORAL | Status: DC | PRN
  Administered 2013-10-25: 2 via ORAL

## 2013-10-23 NOTE — Progress Notes (Signed)
[facility]  TEAM 1 - Stepdown/ICU TEAM  TESLA BOCHICCHIO QQI:[id number] DOB: [birth year] DOA: 10/21/2013 PCP: [name], MD  Brief narrative: 79 year old male patient who had a syncopal episode at hemodialysis on the day of admission. Upon presentation to the emergency department he was found to have a temperature of 100.4. Chest x-ray was concerning for left basilar pneumonia. Patient was found to be hypotensive with tachycardia with apparent severe anemia (Hgb 5.2). Procalcitonin was greater than 175 - admission troponin was 2.43. EKG was nonischemic. Lactic acid was 3. He was admitted to the ICU and transfused.  After admission troponin peaked to 7.38. Procalcitonin has remained greater than 175. He subsequently developed leukocytosis. At time of admission he was begun empirically on broad-spectrum antibiotics to cover for pneumonia. Subsequent cultures have been negative for growth. Because the patient is on dialysis Nephrology was consulted. Due to the anemia GI was consulted the patient was subsequently heme-negative and it was surmised that initial hemoglobin of 5.2 may have been spurious. Patient's blood pressure improved without need for pressors.  Patient stabilized within 36 hours and was deemed appropriate to transfer out of the step down unit. After admission patient's dialysis access was found to be clotted so [facility] was consulted. Plans are to proceed with probable lysis of graft today.  Assessment/Plan:    Sepsis -Sepsis physiology resolved -Source likely pulmonary -Blood culture no growth to date    Acute respiratory failure with hypoxia/LLL pneumonia -Weaning oxygen slowly -Continue empiric anbx's -Sputum cx pending -Respiratory virus panel negative so discontinue Tamiflu    CKD (chronic kidney disease) stage V requiring chronic dialysis -Per nephrology -Plan dialysis once graft declotted    Demand ischemia -Troponin trend down -No apparent ischemic  symptoms such as chest pain or dyspnea on exertion -Check 2-D echocardiogram if RWMA and will need Cardiology consultation -At time of admission patient had accelerated junctional rhythm nonspecific T wave abnormality-followup EKG 10/22/2013 with first degree AV block and ST abnormalities in the inferior lateral leads concerning for possible ischemia    Systolic murmur -Located aortic area and if stenosis present could explain patient's syncopal event -Echo pending    Melena -GI followed -Heme negative -No recurrence and GI suspects initial hemoglobin spurious result    Anemia in CKD (chronic kidney disease) -Hemoglobin stable at 10.2    Hypothyroidism -Continue Synthroid -check TSH  DVT prophylaxis: Subcutaneous heparin Code Status: Full Family Communication: No family at bedside Disposition Plan/Expected LOS: Transfer to telemetry  Consultants: [facility] GI Nephrology  Procedures: None  CULTURES:  1/10 Blood >>>  1/11 sputum >>>  1/11 Urine >>> 1/11 respiratory virus panel  neg  Antibiotics: 1/10 Cefepime >>> 1/10 (1 dose in ED)  1/10 Vancomycin >>>  1/10 Zosyn >>>  1/10 Azithro >>>   HPI/Subjective: Patient alert without specific complaints. Wants to go home but understands not clinically ready. Denies current chest pain or shortness of breath. Does endorse dizziness when sitting up or standing.  Reports he has never been told he has a murmur.  Objective: Blood pressure 125/93, pulse 77, temperature 97.3 F (36.3 C), temperature source Oral, resp. rate 13, height $RemoveBe'5\' 11"'mjGxFMIbf$  (1.803 m), weight 197 lb 15.6 oz (89.8 kg), SpO2 99.00%.  Intake/Output Summary (Last 24 hours) at 10/23/13 1056 Last data filed at 10/23/13 0925  Gross per 24 hour  Intake    570 ml  Output      0 ml  Net    570 ml   Exam: General:  No acute respiratory distress Lungs: Clear to auscultation bilaterally without wheezes or crackles, 2L Cardiovascular: Regular rate and rhythm without  gallop or  rub - prominent 3/6 holosystolic murmur radiating into the carotids, no peripheral edema or JVD Abdomen: Nontender, nondistended, soft, bowel sounds positive, no rebound, no ascites, no appreciable mass Musculoskeletal: No significant cyanosis, clubbing of bilateral lower extremities Neurological: Alert and oriented x 3, moves all extremities x 4 without focal neurological deficits, CN 2-12 intact  Scheduled Meds:  Scheduled Meds: . azithromycin  500 mg Intravenous Q24H  . calcium acetate  1,334 mg Oral TID WC  . cinacalcet  60 mg Oral Q breakfast  . citalopram  20 mg Oral Daily  . [START ON 10/24/2013] darbepoetin  60 mcg Intravenous Q Tue-HD  . [START ON 10/24/2013] doxercalciferol  10 mcg Intravenous Q T,Th,Sa-HD  . heparin subcutaneous  5,000 Units Subcutaneous Q8H  . multivitamin  1 tablet Oral QHS  . [START ON 10/24/2013] oseltamivir  30 mg Oral Q T,Th,Sat-1800  . pantoprazole  20 mg Oral Daily  . piperacillin-tazobactam (ZOSYN)  IV  2.25 g Intravenous Q8H  . simvastatin  10 mg Oral q1800  . [START ON 10/24/2013] vancomycin  1,000 mg Intravenous Q T,Th,Sa-HD    Data Reviewed: Basic Metabolic Panel:  Recent Labs Lab 10/21/13 1644 10/22/13 0504 10/22/13 1130 10/23/13 0055  NA 143 140  --  135*  K 3.0* 4.9  --  4.3  CL 105 99  --  95*  CO2 22 24  --  24  GLUCOSE 87 127*  --  92  BUN 24* 40*  --  60*  CREATININE 4.49* 6.73*  --  8.47*  CALCIUM 6.8* 8.8  --  9.7  PHOS  --   --  5.2*  --    Liver Function Tests:  Recent Labs Lab 10/21/13 1644  AST 7  ALT <5  ALKPHOS 25*  BILITOT 0.4  PROT 5.4*  ALBUMIN 2.1*   CBC:  Recent Labs Lab 10/21/13 1644 10/21/13 2230 10/22/13 0245 10/22/13 0700 10/23/13 0055  WBC 5.0  --  16.5*  --  13.1*  NEUTROABS 4.6  --   --   --   --   HGB 5.2* 11.6* 11.1* 10.8* 10.2*  HCT 16.4* 36.0* 34.8* 33.4* 31.3*  MCV 88.2  --  87.7  --  87.2  PLT 100*  --  183  --  179   Cardiac Enzymes:  Recent Labs Lab 10/21/13 2359  10/22/13 0503 10/22/13 1130 10/22/13 1805 10/23/13 0055  TROPONINI 2.43* 6.85* 7.38* 4.96* 3.90*   CBG:  Recent Labs Lab 10/21/13 2149 10/22/13 0830 10/22/13 1141 10/22/13 1709 10/23/13 0722  GLUCAP 110* 120* 82 105* 78    Recent Results (from the past 240 hour(s))  CULTURE, BLOOD (ROUTINE X 2)     Status: None   Collection Time    10/21/13  4:50 PM      Result Value Range Status   Specimen Description BLOOD RIGHT FOREARM   Final   Special Requests BOTTLES DRAWN AEROBIC AND ANAEROBIC 5CC   Final   Culture  Setup Time     Final   Value: 10/21/2013 22:05     Performed at [facility]   Culture     Final   Value:        BLOOD CULTURE RECEIVED NO GROWTH TO DATE CULTURE WILL BE HELD FOR 5 DAYS BEFORE ISSUING A FINAL NEGATIVE REPORT     Performed at  [facility]   Report Status PENDING   Incomplete  CULTURE, BLOOD (ROUTINE X 2)     Status: None   Collection Time    10/21/13  5:05 PM      Result Value Range Status   Specimen Description BLOOD RIGHT WRIST   Final   Special Requests BOTTLES DRAWN AEROBIC AND ANAEROBIC 5CC   Final   Culture  Setup Time     Final   Value: 10/21/2013 22:04     Performed at [facility]   Culture     Final   Value:        BLOOD CULTURE RECEIVED NO GROWTH TO DATE CULTURE WILL BE HELD FOR 5 DAYS BEFORE ISSUING A FINAL NEGATIVE REPORT     Performed at [facility]   Report Status PENDING   Incomplete  MRSA PCR SCREENING     Status: None   Collection Time    10/21/13 10:14 PM      Result Value Range Status   MRSA by PCR NEGATIVE  NEGATIVE Final   Comment:            The GeneXpert MRSA Assay (FDA     approved for NASAL specimens     only), is one component of a     comprehensive MRSA colonization     surveillance program. It is not     intended to diagnose MRSA     infection nor to guide or     monitor treatment for     MRSA infections.  RESPIRATORY VIRUS PANEL     Status: None   Collection Time    10/21/13  11:04 PM      Result Value Range Status   Source - RVPAN NASAL SWAB   Corrected   Comment: CORRECTED ON 01/12 AT 0138: PREVIOUSLY REPORTED AS NASAL SWAB   Respiratory Syncytial Virus A NOT DETECTED   Final   Respiratory Syncytial Virus B NOT DETECTED   Final   Influenza A NOT DETECTED   Final   Influenza B NOT DETECTED   Final   Parainfluenza 1 NOT DETECTED   Final   Parainfluenza 2 NOT DETECTED   Final   Parainfluenza 3 NOT DETECTED   Final   Metapneumovirus NOT DETECTED   Final   Rhinovirus NOT DETECTED   Final   Adenovirus NOT DETECTED   Final   Influenza A H1 NOT DETECTED   Final   Influenza A H3 NOT DETECTED   Final   Comment: (NOTE)           Normal Reference Range for each Analyte: NOT DETECTED     Testing performed using the Luminex xTAG Respiratory Viral Panel test     kit.     This test was developed and its performance characteristics determined     by [facility]. It has not been cleared or approved by the [place]     Food and Drug Administration. This test is used for clinical purposes.     It should not be regarded as investigational or for research. This     laboratory is certified under the [place] (CLIA) as qualified to perform high complexity     clinical laboratory testing.     Performed at [place], EXPECTORATED SPUTUM-ASSESSMENT     Status: None   Collection Time    10/22/13  6:26 AM  Result Value Range Status   Specimen Description SPUTUM   Final   Special Requests Normal   Final   Sputum evaluation     Final   Value: THIS SPECIMEN IS ACCEPTABLE. RESPIRATORY CULTURE REPORT TO FOLLOW.   Report Status 10/22/2013 FINAL   Final  CULTURE, RESPIRATORY (NON-EXPECTORATED)     Status: None   Collection Time    10/22/13  6:26 AM      Result Value Range Status   Specimen Description SPUTUM   Final   Special Requests NONE   Final   Gram Stain PENDING   Incomplete   Culture     Final    Value: Culture reincubated for better growth     Performed at [facility]   Report Status PENDING   Incomplete     Studies:  Recent x-ray studies have been reviewed in detail by the Attending Physician  Time spent : 11mins  Allison Ellis, ANP [facility] Office  [phone] Pager [phone]  **If unable to reach the above provider after paging please contact the [place] @ [phone]  On-Call/Text Page:      [email or web address]      password [email or web address]  If 7PM-7AM, please contact night-coverage [email or web address] Password [email or web address] 10/23/2013, 10:56 AM   LOS: 2 days   I have personally examined this patient and reviewed the entire database. I have reviewed the above note, made any necessary editorial changes, and agree with its content.  Cherene Altes, MD [facility]

## 2013-10-23 NOTE — H&P (Signed)
Chief Complaint: "my graft is not working." HPI: Brad Mcintosh is an 79 y.o. male with ESRD on HD TTS last session was 10/21/13 where the patient had a syncopal episode following the session per dialysis staff syncope was no longer than a minute, at that time the patient had 100F temp and vomited twice. He states prior to HD he had been coughing throughout the week. He was brought to [facility] ED where he was found to have LLL PNA and started on antibiotics. He has been afebrile with wbc trending down. Blood cultures show no growth and pending. He also was found to have hgb 5.2 and c/o black stools this past week, he denies any lightheadedness or dizziness. He was transfused 1 unit and H/H stable 10.2 / 31.3 Heme has been negative this admission. He also was found to be hypotensive with BP 60/79mmHg and elevated troponin which has trending down and BP stable. He denies any history of CAD/MI, he denies any chest pain, shortness of breath, or diaphoresis. ECG performed no significant change from previous ECG. LUE AVG with no bruit, nephrology and vascular has requested IR to perform fistulogram with possible intervention.   Past Medical History:  Past Medical History  Diagnosis Date  . Hypertension   . Hyperlipidemia   . Cancer     prostate  . Gout   . Anemia     Iron deficiency  . Thyroid disease   . Malnutrition   . Atrial fibrillation     paroxysmal  . Chronic kidney disease     Pt has HD on TUESDAY, THURSDAY and SATURDAY  at [facility]  . Peripheral vascular disease     Past Surgical History:  Past Surgical History  Procedure Laterality Date  . Av fistula placement  06/2006    left forearm  . Amputation      right finger  . Incise and drain abcess      chronic complex infection right long finger  . Insertion of dialysis catheter  05/30/2010    right internal jugular Palindrome catheter  . Aortogram  06/01/11    w/ runoff    Family History:  Family History  Problem Relation Age of  Onset  . Hypertension Mother   . Asthma Father   . Cancer Sister     breast  . Kidney disease Brother   . Diabetes Brother     Social History:  reports that he quit smoking about 41 years ago. His smoking use included Cigarettes. He smoked 0.50 packs per day. He has never used smokeless tobacco. He reports that he does not drink alcohol or use illicit drugs.  Allergies: No Known Allergies    Medication List    ASK your doctor about these medications       acetaminophen 160 MG/5ML suspension  Commonly known as:  TYLENOL  Take 1,000 mg by mouth every 6 (six) hours as needed.     calcium acetate 667 MG capsule  Commonly known as:  PHOSLO  Take 667 mg by mouth 3 (three) times daily with meals.     cinacalcet 60 MG tablet  Commonly known as:  SENSIPAR  Take 60 mg by mouth daily. Takes 1 tablet with evening meal     citalopram 20 MG tablet  Commonly known as:  CELEXA  Take 20 mg by mouth at bedtime. Depression     cyclobenzaprine 10 MG tablet  Commonly known as:  FLEXERIL  Take one at bed time for  back pain and spasms     dexlansoprazole 60 MG capsule  Commonly known as:  DEXILANT  Take 60 mg by mouth daily.     gabapentin 100 MG capsule  Commonly known as:  NEURONTIN  Take 100 mg by mouth 2 (two) times daily.     HYDROcodone-acetaminophen 5-325 MG per tablet  Commonly known as:  NORCO/VICODIN  Take 1 tablet by mouth at bedtime as needed. pain     metoprolol succinate 100 MG 24 hr tablet  Commonly known as:  TOPROL-XL  Take 100 mg by mouth daily.     ondansetron 4 MG disintegrating tablet  Commonly known as:  ZOFRAN-ODT  Take 4 mg by mouth every 8 (eight) hours as needed for nausea or vomiting.     simvastatin 10 MG tablet  Commonly known as:  ZOCOR  Take 10 mg by mouth at bedtime.     traMADol 50 MG tablet  Commonly known as:  ULTRAM  Take 50 mg by mouth every 6 (six) hours as needed for moderate pain.       Please HPI for pertinent positives, otherwise  complete 10 system ROS negative.  Physical Exam: BP 125/93  Pulse 77  Temp(Src) 97.3 F (36.3 C) (Oral)  Resp 13  Ht $R'5\' 11"'gZ$  (1.803 m)  Wt 197 lb 15.6 oz (89.8 kg)  BMI 27.62 kg/m2  SpO2 99% Body mass index is 27.62 kg/(m^2).  General Appearance:  Alert, cooperative, no distress  Head:  Normocephalic, without obvious abnormality, atraumatic  Neck: Supple, symmetrical, trachea midline  Lungs:   Clear to auscultation bilaterally, no w/r/r, respirations unlabored without use of accessory muscles.  Chest Wall:  No tenderness or deformity  Heart:  Regular rate and rhythm, S1, S2 normal, no murmur, rub or gallop.  Abdomen:   Soft, non-tender, non distended, (+) BS  Extremities: LUE AVF with no bruit, atraumatic, no cyanosis or edema  Neurologic: Normal affect, no gross deficits.   Results for orders placed during the hospital encounter of 10/21/13 (from the past 48 hour(s))  GLUCOSE, CAPILLARY     Status: Abnormal   Collection Time    10/21/13  4:27 PM      Result Value Range   Glucose-Capillary 102 (*) 70 - 99 mg/dL  CBC WITH DIFFERENTIAL     Status: Abnormal   Collection Time    10/21/13  4:44 PM      Result Value Range   WBC 5.0  4.0 - 10.5 K/uL   RBC 1.86 (*) 4.22 - 5.81 MIL/uL   Hemoglobin 5.2 (*) 13.0 - 17.0 g/dL   Comment: CRITICAL RESULT CALLED TO, READ BACK BY AND VERIFIED WITH:     THOMPSON,A RN 10/21/13 1728 WOOTEN,K   HCT 16.4 (*) 39.0 - 52.0 %   MCV 88.2  78.0 - 100.0 fL   MCH 28.0  26.0 - 34.0 pg   MCHC 31.7  30.0 - 36.0 g/dL   RDW 18.9 (*) 11.5 - 15.5 %   Platelets 100 (*) 150 - 400 K/uL   Comment: PLATELET COUNT CONFIRMED BY SMEAR   Neutrophils Relative % 91 (*) 43 - 77 %   Neutro Abs 4.6  1.7 - 7.7 K/uL   Lymphocytes Relative 5 (*) 12 - 46 %   Lymphs Abs 0.3 (*) 0.7 - 4.0 K/uL   Monocytes Relative 4  3 - 12 %   Monocytes Absolute 0.2  0.1 - 1.0 K/uL   Eosinophils Relative 0  0 - 5 %  Eosinophils Absolute 0.0  0.0 - 0.7 K/uL   Basophils Relative 0  0 - 1  %   Basophils Absolute 0.0  0.0 - 0.1 K/uL  COMPREHENSIVE METABOLIC PANEL     Status: Abnormal   Collection Time    10/21/13  4:44 PM      Result Value Range   Sodium 143  137 - 147 mEq/L   Potassium 3.0 (*) 3.7 - 5.3 mEq/L   Chloride 105  96 - 112 mEq/L   CO2 22  19 - 32 mEq/L   Glucose, Bld 87  70 - 99 mg/dL   BUN 24 (*) 6 - 23 mg/dL   Creatinine, Ser 4.49 (*) 0.50 - 1.35 mg/dL   Calcium 6.8 (*) 8.4 - 10.5 mg/dL   Total Protein 5.4 (*) 6.0 - 8.3 g/dL   Albumin 2.1 (*) 3.5 - 5.2 g/dL   AST 7  0 - 37 U/L   ALT <5  0 - 53 U/L   Alkaline Phosphatase 25 (*) 39 - 117 U/L   Total Bilirubin 0.4  0.3 - 1.2 mg/dL   GFR calc non Af Amer 12 (*) >90 mL/min   GFR calc Af Amer 13 (*) >90 mL/min   Comment: (NOTE)     The eGFR has been calculated using the CKD EPI equation.     This calculation has not been validated in all clinical situations.     eGFR's persistently <90 mL/min signify possible Chronic Kidney     Disease.  CULTURE, BLOOD (ROUTINE X 2)     Status: None   Collection Time    10/21/13  4:50 PM      Result Value Range   Specimen Description BLOOD RIGHT FOREARM     Special Requests BOTTLES DRAWN AEROBIC AND ANAEROBIC 5CC     Culture  Setup Time       Value: 10/21/2013 22:05     Performed at [facility]   Culture       Value:        BLOOD CULTURE RECEIVED NO GROWTH TO DATE CULTURE WILL BE HELD FOR 5 DAYS BEFORE ISSUING A FINAL NEGATIVE REPORT     Performed at [facility]   Report Status PENDING    TYPE AND SCREEN     Status: None   Collection Time    10/21/13  4:50 PM      Result Value Range   ABO/RH(D) O POS     Antibody Screen NEG     Sample Expiration 10/24/2013     Unit Number [id number]     Blood Component Type RED CELLS,LR     Unit division 00     Status of Unit ISSUED,FINAL     Transfusion Status OK TO TRANSFUSE     Crossmatch Result Compatible    PREPARE RBC (CROSSMATCH)     Status: None   Collection Time    10/21/13  4:50 PM       Result Value Range   Order Confirmation ORDER PROCESSED BY BLOOD BANK    CULTURE, BLOOD (ROUTINE X 2)     Status: None   Collection Time    10/21/13  5:05 PM      Result Value Range   Specimen Description BLOOD RIGHT WRIST     Special Requests BOTTLES DRAWN AEROBIC AND ANAEROBIC 5CC     Culture  Setup Time       Value: 10/21/2013 22:04     Performed at [facility]   Culture       Value:        BLOOD CULTURE RECEIVED NO GROWTH TO DATE CULTURE WILL BE HELD FOR 5 DAYS BEFORE ISSUING A FINAL NEGATIVE REPORT     Performed at [facility]   Report Status PENDING    CG4 I-STAT (LACTIC ACID)     Status: Abnormal   Collection Time    10/21/13  5:08 PM      Result Value Range   Lactic Acid, Venous 3.00 (*) 0.5 - 2.2 mmol/L  OCCULT BLOOD, POC DEVICE     Status: None   Collection Time    10/21/13  5:15 PM      Result Value Range   Fecal Occult Bld NEGATIVE  NEGATIVE  POCT I-STAT 3, BLOOD GAS (G3+)     Status: Abnormal   Collection Time    10/21/13  5:53 PM      Result Value Range   pH, Arterial 7.445  7.350 - 7.450   pCO2 arterial 37.7  35.0 - 45.0 mmHg   pO2, Arterial 35.0 (*) 80.0 - 100.0 mmHg   Bicarbonate 25.9 (*) 20.0 - 24.0 mEq/L   TCO2 27  0 - 100 mmol/L   O2 Saturation 71.0     Acid-Base Excess 2.0  0.0 - 2.0 mmol/L   Patient temperature 98.1 F     Collection site RADIAL, ALLEN'S TEST ACCEPTABLE     Drawn by Operator     Sample type ARTERIAL     Comment VALUES EXPECTED, NO REPEAT    GLUCOSE, CAPILLARY     Status: Abnormal   Collection Time    10/21/13  9:49 PM      Result Value Range   Glucose-Capillary 110 (*) 70 - 99 mg/dL   Comment 1 Notify RN    MRSA PCR SCREENING     Status: None   Collection Time    10/21/13 10:14 PM      Result Value Range   MRSA by PCR NEGATIVE  NEGATIVE   Comment:            The GeneXpert MRSA Assay (FDA     approved for NASAL specimens     only), is one component of a     comprehensive MRSA colonization     surveillance  program. It is not     intended to diagnose MRSA     infection nor to guide or     monitor treatment for     MRSA infections.  OCCULT BLOOD X 1 CARD TO LAB, STOOL     Status: None   Collection Time    10/21/13 10:15 PM      Result Value Range   Fecal Occult Bld NEGATIVE  NEGATIVE  HEMOGLOBIN AND HEMATOCRIT, BLOOD     Status: Abnormal   Collection Time    10/21/13 10:30 PM      Result Value Range   Hemoglobin 11.6 (*) 13.0 - 17.0 g/dL   Comment: POST TRANSFUSION SPECIMEN   HCT 36.0 (*) 39.0 - 52.0 %  LACTATE DEHYDROGENASE     Status: None   Collection Time    10/21/13 10:30 PM      Result Value Range   LDH 218  94 - 250 U/L   Comment: HEMOLYSIS AT THIS LEVEL MAY AFFECT RESULT  RETICULOCYTES     Status: Abnormal   Collection Time    10/21/13 10:30 PM      Result  Value Range   Retic Ct Pct 1.4  0.4 - 3.1 %   RBC. 4.05 (*) 4.22 - 5.81 MIL/uL   Retic Count, Manual 56.7  19.0 - 186.0 K/uL  HAPTOGLOBIN     Status: None   Collection Time    10/21/13 10:30 PM      Result Value Range   Haptoglobin 168  45 - 215 mg/dL   Comment: Performed at [place] ACID, PLASMA     Status: None   Collection Time    10/21/13 10:30 PM      Result Value Range   Lactic Acid, Venous 1.9  0.5 - 2.2 mmol/L  PROCALCITONIN     Status: None   Collection Time    10/21/13 10:30 PM      Result Value Range   Procalcitonin 142.55     Comment:            Interpretation:     PCT >= 10 ng/mL:     Important systemic inflammatory response,     almost exclusively due to severe bacterial     sepsis or septic shock.     (NOTE)             ICU PCT Algorithm               Non ICU PCT Algorithm        ----------------------------     ------------------------------             PCT < 0.25 ng/mL                 PCT < 0.1 ng/mL         Stopping of antibiotics            Stopping of antibiotics           strongly encouraged.               strongly encouraged.        ----------------------------      ------------------------------           PCT level decrease by               PCT < 0.25 ng/mL           >= 80% from peak PCT           OR PCT 0.25 - 0.5 ng/mL          Stopping of antibiotics                                                 encouraged.         Stopping of antibiotics               encouraged.        ----------------------------     ------------------------------           PCT level decrease by              PCT >= 0.25 ng/mL           < 80% from peak PCT            AND PCT >= 0.5 ng/mL            Continuing antibiotics  encouraged.           Continuing antibiotics                encouraged.        ----------------------------     ------------------------------         PCT level increase compared          PCT > 0.5 ng/mL             with peak PCT AND              PCT >= 0.5 ng/mL             Escalation of antibiotics                                              strongly encouraged.          Escalation of antibiotics            strongly encouraged.  APTT     Status: None   Collection Time    10/21/13 10:30 PM      Result Value Range   aPTT 30  24 - 37 seconds  PROTIME-INR     Status: None   Collection Time    10/21/13 10:30 PM      Result Value Range   Prothrombin Time 15.0  11.6 - 15.2 seconds   INR 1.21  0.00 - 1.49  IRON AND TIBC     Status: Abnormal   Collection Time    10/21/13 10:30 PM      Result Value Range   Iron 15 (*) 42 - 135 ug/dL   TIBC 131 (*) 215 - 435 ug/dL   Saturation Ratios 11 (*) 20 - 55 %   UIBC 116 (*) 125 - 400 ug/dL   Comment: Performed at [place]     Status: Abnormal   Collection Time    10/21/13 10:30 PM      Result Value Range   Ferritin 1540 (*) 22 - 322 ng/mL   Comment: Performed at [place]     Status: Abnormal   Collection Time    10/21/13 10:30 PM      Result Value Range   Transferrin 100 (*) 200 - 360 mg/dL   Comment:  Performed at [facility]     Status: None   Collection Time    10/21/13 11:04 PM      Result Value Range   Source - RVPAN NASAL SWAB     Comment: CORRECTED ON 01/12 AT 0138: PREVIOUSLY REPORTED AS NASAL SWAB   Respiratory Syncytial Virus A NOT DETECTED     Respiratory Syncytial Virus B NOT DETECTED     Influenza A NOT DETECTED     Influenza B NOT DETECTED     Parainfluenza 1 NOT DETECTED     Parainfluenza 2 NOT DETECTED     Parainfluenza 3 NOT DETECTED     Metapneumovirus NOT DETECTED     Rhinovirus NOT DETECTED     Adenovirus NOT DETECTED     Influenza A H1 NOT DETECTED     Influenza A H3 NOT DETECTED     Comment: (NOTE)           Normal Reference Range for each Analyte: NOT DETECTED  Testing performed using the Luminex xTAG Respiratory Viral Panel test     kit.     This test was developed and its performance characteristics determined     by [facility]. It has not been cleared or approved by the [place]     Food and Drug Administration. This test is used for clinical purposes.     It should not be regarded as investigational or for research. This     laboratory is certified under the [place] (CLIA) as qualified to perform high complexity     clinical laboratory testing.     Performed at [facility]  TROPONIN I     Status: Abnormal   Collection Time    10/21/13 11:59 PM      Result Value Range   Troponin I 2.43 (*) <0.30 ng/mL   Comment:            Due to the release kinetics of cTnI,     a negative result within the first hours     of the onset of symptoms does not rule out     myocardial infarction with certainty.     If myocardial infarction is still suspected,     repeat the test at appropriate intervals.     REPEATED TO VERIFY     A. SPERRY RN [phone] [id number] GREEN R     CRITICAL RESULT CALLED TO, READ BACK BY AND VERIFIED WITH:     A. SPERRY RN [phone] GREEN R  CBC      Status: Abnormal   Collection Time    10/22/13  2:45 AM      Result Value Range   WBC 16.5 (*) 4.0 - 10.5 K/uL   RBC 3.97 (*) 4.22 - 5.81 MIL/uL   Hemoglobin 11.1 (*) 13.0 - 17.0 g/dL   HCT 34.8 (*) 39.0 - 52.0 %   MCV 87.7  78.0 - 100.0 fL   MCH 28.0  26.0 - 34.0 pg   MCHC 31.9  30.0 - 36.0 g/dL   RDW 18.1 (*) 11.5 - 15.5 %   Platelets 183  150 - 400 K/uL  TROPONIN I     Status: Abnormal   Collection Time    10/22/13  5:03 AM      Result Value Range   Troponin I 6.85 (*) <0.30 ng/mL   Comment:            Due to the release kinetics of cTnI,     a negative result within the first hours     of the onset of symptoms does not rule out     myocardial infarction with certainty.     If myocardial infarction is still suspected,     repeat the test at appropriate intervals.     CRITICAL VALUE NOTED.  VALUE IS CONSISTENT WITH PREVIOUSLY REPORTED AND CALLED VALUE.  PROCALCITONIN     Status: None   Collection Time    10/22/13  5:04 AM      Result Value Range   Procalcitonin >175.00     Comment:            Interpretation:     PCT >= 10 ng/mL:     Important systemic inflammatory response,     almost exclusively due to severe bacterial     sepsis or septic shock.     (NOTE)  ICU PCT Algorithm               Non ICU PCT Algorithm        ----------------------------     ------------------------------             PCT < 0.25 ng/mL                 PCT < 0.1 ng/mL         Stopping of antibiotics            Stopping of antibiotics           strongly encouraged.               strongly encouraged.        ----------------------------     ------------------------------           PCT level decrease by               PCT < 0.25 ng/mL           >= 80% from peak PCT           OR PCT 0.25 - 0.5 ng/mL          Stopping of antibiotics                                                 encouraged.         Stopping of antibiotics               encouraged.        ----------------------------      ------------------------------           PCT level decrease by              PCT >= 0.25 ng/mL           < 80% from peak PCT            AND PCT >= 0.5 ng/mL            Continuing antibiotics                                                  encouraged.           Continuing antibiotics                encouraged.        ----------------------------     ------------------------------         PCT level increase compared          PCT > 0.5 ng/mL             with peak PCT AND              PCT >= 0.5 ng/mL             Escalation of antibiotics                                              strongly encouraged.          Escalation of antibiotics  strongly encouraged.  BASIC METABOLIC PANEL     Status: Abnormal   Collection Time    10/22/13  5:04 AM      Result Value Range   Sodium 140  137 - 147 mEq/L   Potassium 4.9  3.7 - 5.3 mEq/L   Chloride 99  96 - 112 mEq/L   CO2 24  19 - 32 mEq/L   Glucose, Bld 127 (*) 70 - 99 mg/dL   BUN 40 (*) 6 - 23 mg/dL   Creatinine, Ser 6.73 (*) 0.50 - 1.35 mg/dL   Calcium 8.8  8.4 - 10.5 mg/dL   GFR calc non Af Amer 7 (*) >90 mL/min   GFR calc Af Amer 8 (*) >90 mL/min   Comment: (NOTE)     The eGFR has been calculated using the CKD EPI equation.     This calculation has not been validated in all clinical situations.     eGFR's persistently <90 mL/min signify possible Chronic Kidney     Disease.  CULTURE, EXPECTORATED SPUTUM-ASSESSMENT     Status: None   Collection Time    10/22/13  6:26 AM      Result Value Range   Specimen Description SPUTUM     Special Requests Normal     Sputum evaluation       Value: THIS SPECIMEN IS ACCEPTABLE. RESPIRATORY CULTURE REPORT TO FOLLOW.   Report Status 10/22/2013 FINAL    HEMOGLOBIN AND HEMATOCRIT, BLOOD     Status: Abnormal   Collection Time    10/22/13  7:00 AM      Result Value Range   Hemoglobin 10.8 (*) 13.0 - 17.0 g/dL   HCT 33.4 (*) 39.0 - 52.0 %  GLUCOSE, CAPILLARY     Status: Abnormal    Collection Time    10/22/13  8:30 AM      Result Value Range   Glucose-Capillary 120 (*) 70 - 99 mg/dL  TROPONIN I     Status: Abnormal   Collection Time    10/22/13 11:30 AM      Result Value Range   Troponin I 7.38 (*) <0.30 ng/mL   Comment:            Due to the release kinetics of cTnI,     a negative result within the first hours     of the onset of symptoms does not rule out     myocardial infarction with certainty.     If myocardial infarction is still suspected,     repeat the test at appropriate intervals.     CRITICAL VALUE NOTED.  VALUE IS CONSISTENT WITH PREVIOUSLY REPORTED AND CALLED VALUE.  PHOSPHORUS     Status: Abnormal   Collection Time    10/22/13 11:30 AM      Result Value Range   Phosphorus 5.2 (*) 2.3 - 4.6 mg/dL  GLUCOSE, CAPILLARY     Status: None   Collection Time    10/22/13 11:41 AM      Result Value Range   Glucose-Capillary 82  70 - 99 mg/dL  GLUCOSE, CAPILLARY     Status: Abnormal   Collection Time    10/22/13  5:09 PM      Result Value Range   Glucose-Capillary 105 (*) 70 - 99 mg/dL  TROPONIN I     Status: Abnormal   Collection Time    10/22/13  6:05 PM      Result Value Range   Troponin I 4.96 (*) <0.30  ng/mL   Comment:            Due to the release kinetics of cTnI,     a negative result within the first hours     of the onset of symptoms does not rule out     myocardial infarction with certainty.     If myocardial infarction is still suspected,     repeat the test at appropriate intervals.     CRITICAL VALUE NOTED.  VALUE IS CONSISTENT WITH PREVIOUSLY REPORTED AND CALLED VALUE.  TROPONIN I     Status: Abnormal   Collection Time    10/23/13 12:55 AM      Result Value Range   Troponin I 3.90 (*) <0.30 ng/mL   Comment:            Due to the release kinetics of cTnI,     a negative result within the first hours     of the onset of symptoms does not rule out     myocardial infarction with certainty.     If myocardial infarction is  still suspected,     repeat the test at appropriate intervals.     CRITICAL VALUE NOTED.  VALUE IS CONSISTENT WITH PREVIOUSLY REPORTED AND CALLED VALUE.     Performed at [facility]  CBC     Status: Abnormal   Collection Time    10/23/13 12:55 AM      Result Value Range   WBC 13.1 (*) 4.0 - 10.5 K/uL   RBC 3.59 (*) 4.22 - 5.81 MIL/uL   Hemoglobin 10.2 (*) 13.0 - 17.0 g/dL   HCT 31.3 (*) 39.0 - 52.0 %   MCV 87.2  78.0 - 100.0 fL   MCH 28.4  26.0 - 34.0 pg   MCHC 32.6  30.0 - 36.0 g/dL   RDW 18.4 (*) 11.5 - 15.5 %   Platelets 179  150 - 400 K/uL  BASIC METABOLIC PANEL     Status: Abnormal   Collection Time    10/23/13 12:55 AM      Result Value Range   Sodium 135 (*) 137 - 147 mEq/L   Potassium 4.3  3.7 - 5.3 mEq/L   Chloride 95 (*) 96 - 112 mEq/L   CO2 24  19 - 32 mEq/L   Glucose, Bld 92  70 - 99 mg/dL   BUN 60 (*) 6 - 23 mg/dL   Creatinine, Ser 8.47 (*) 0.50 - 1.35 mg/dL   Calcium 9.7  8.4 - 10.5 mg/dL   GFR calc non Af Amer 5 (*) >90 mL/min   GFR calc Af Amer 6 (*) >90 mL/min   Comment: (NOTE)     The eGFR has been calculated using the CKD EPI equation.     This calculation has not been validated in all clinical situations.     eGFR's persistently <90 mL/min signify possible Chronic Kidney     Disease.  PROCALCITONIN     Status: None   Collection Time    10/23/13 12:55 AM      Result Value Range   Procalcitonin >175.00     Comment:            Interpretation:     PCT >= 10 ng/mL:     Important systemic inflammatory response,     almost exclusively due to severe bacterial     sepsis or septic shock.     (NOTE)  ICU PCT Algorithm               Non ICU PCT Algorithm        ----------------------------     ------------------------------             PCT < 0.25 ng/mL                 PCT < 0.1 ng/mL         Stopping of antibiotics            Stopping of antibiotics           strongly encouraged.               strongly encouraged.         ----------------------------     ------------------------------           PCT level decrease by               PCT < 0.25 ng/mL           >= 80% from peak PCT           OR PCT 0.25 - 0.5 ng/mL          Stopping of antibiotics                                                 encouraged.         Stopping of antibiotics               encouraged.        ----------------------------     ------------------------------           PCT level decrease by              PCT >= 0.25 ng/mL           < 80% from peak PCT            AND PCT >= 0.5 ng/mL            Continuing antibiotics                                                  encouraged.           Continuing antibiotics                encouraged.        ----------------------------     ------------------------------         PCT level increase compared          PCT > 0.5 ng/mL             with peak PCT AND              PCT >= 0.5 ng/mL             Escalation of antibiotics                                              strongly encouraged.          Escalation of antibiotics  strongly encouraged.  LACTIC ACID, PLASMA     Status: None   Collection Time    10/23/13  3:19 AM      Result Value Range   Lactic Acid, Venous 1.2  0.5 - 2.2 mmol/L  GLUCOSE, CAPILLARY     Status: None   Collection Time    10/23/13  7:22 AM      Result Value Range   Glucose-Capillary 78  70 - 99 mg/dL   Dg Chest Port 1 View  10/22/2013   CLINICAL DATA:  Fever and sepsis.  EXAM: PORTABLE CHEST - 1 VIEW  COMPARISON:  10/21/2013 and prior chest radiographs  FINDINGS: Cardiomegaly again noted.  Mid-lower lung airspace opacities slightly improved.  No new airspace disease, pulmonary edema or pneumothorax noted.  There is no evidence pleural effusion.  IMPRESSION: Slightly improved left lung airspace disease/pneumonia.   Electronically Signed   By: Hassan Rowan M.D.   On: 10/22/2013 08:11   Dg Chest Portable 1 View  10/21/2013   CLINICAL DATA:  Status post central line  placement  EXAM: PORTABLE CHEST - 1 VIEW  COMPARISON:  10/21/2013  FINDINGS: Cardiac shadow is stable. Aortic calcifications are again seen. Left basilar infiltrate is again noted. The right lung remains clear. There is been placement of a right-sided central venous line although it is coiled within the neck. Is difficult to assess whether it lies completely within the vein or within the adjacent soft tissues. Clinical correlation as to blood withdrawal is recommended.  IMPRESSION: Status post central line placement coiled within the right neck. Correlation with the ability to aspirate blood is recommended.   Electronically Signed   By: Inez Catalina M.D.   On: 10/21/2013 20:28   Dg Chest Port 1 View  10/21/2013   CLINICAL DATA:  Loss of consciousness  EXAM: PORTABLE CHEST - 1 VIEW  COMPARISON:  11/16/2012  FINDINGS: Cardiac shadow is mildly enlarged. The lungs are well aerated with diffuse increased density in the left mid and lower lung consistent with acute pneumonia. No bony abnormality is noted.  IMPRESSION: Left basilar pneumonia.   Electronically Signed   By: Inez Catalina M.D.   On: 10/21/2013 17:08    Assessment/Plan ESRD on HD TTS, last HD 10/21/13 with syncopal episode. LUE AVF clotted with no bruit. Request for image guided fistulogram with possible balloon/stenting or HD catheter placement.  Elevated troponins, denies symptoms, trending down, thought to be secondary to demand ischemia, ECG unchanged from previous.  LLL PNA on antibiotics, afebrile, wbc trending down.  Melena, hgb 5.2 on admission s/p transfusion with stable H/H, GI has evaluated no need for intervention at this time, heme stools negative. Patient ate at 0900, will wait 6 hours until procedure, hold next sq heparin dose at 1400. Risks and Benefits discussed with the patient. All of the patient's questions were answered, patient is agreeable to proceed. Consent signed and in chart.   Tsosie Billing D PA-C 10/23/2013, 9:39  AM

## 2013-10-23 NOTE — Progress Notes (Signed)
Pt had a bed on 5w, pt was made aware of his new bed and transfer, report was given to the receiving rn Dora and pt was subsequently transfered to 5w17.----Agata Lucente, rn

## 2013-10-23 NOTE — Progress Notes (Signed)
Pt transferred to the unit. Pt is stable, alert and oriented per baseline. Oriented to room, staff, and call bell. Educated to call for any assistance. Bed in lowest position, call bell within reach- will continue to monitor. 

## 2013-10-23 NOTE — Progress Notes (Signed)
Utilization Review Completed.  

## 2013-10-23 NOTE — Progress Notes (Signed)
  [facility] Progress Note   Subjective: Feeling better, "I feel much better", occ cough  Filed Vitals:   10/23/13 0200 10/23/13 0300 10/23/13 0354 10/23/13 0724  BP: 131/66 137/73 125/62 125/93  Pulse: 77 77 76 77  Temp:   97.9 F (36.6 C) 97.3 F (36.3 C)  TempSrc:   Axillary Oral  Resp: 16 12 12 13   Height:      Weight:   89.8 kg (197 lb 15.6 oz)   SpO2: 98% 98% 97% 99%  Exam Alert, no distress No jvd Chest clear bilat RRR loud 2/6 M rusb radiating R carotid Abd soft, nt/nd No LE or UE edema No neuro deficits, ox3 LUA AVG no bruit  [facility]  DW 84.5kg  Profile 4   BFR 400     Assessment/Plan: 1. LLL PNA: on bs abx per primary 2. Clotted L arm AV graft: IR evaluating 3. Heart murmur: (last echo nl 2011) > echo ordered 4. ESRD: HD Tuesday 5. HTN/volume: up 4-5 kg by wt, reweigh prior to HD tomorrow 6. Anemia:  cont EPO 1. Sec HPTH: on sensipar and hectorol, phoslo ^'d to [employer] MD pager [phone]    cell [phone] 10/23/2013, 10:41 AM   Recent Labs Lab 10/21/13 1644 10/22/13 0504 10/22/13 1130 10/23/13 0055  NA 143 140  --  135*  K 3.0* 4.9  --  4.3  CL 105 99  --  95*  CO2 22 24  --  24  GLUCOSE 87 127*  --  92  BUN 24* 40*  --  60*  CREATININE 4.49* 6.73*  --  8.47*  CALCIUM 6.8* 8.8  --  9.7  PHOS  --   --  5.2*  --     Recent Labs Lab 10/21/13 1644  AST 7  ALT <5  ALKPHOS 25*  BILITOT 0.4  PROT 5.4*  ALBUMIN 2.1*    Recent Labs Lab 10/21/13 1644  10/22/13 0245 10/22/13 0700 10/23/13 0055  WBC 5.0  --  16.5*  --  13.1*  NEUTROABS 4.6  --   --   --   --   HGB 5.2*  < > 11.1* 10.8* 10.2*  HCT 16.4*  < > 34.8* 33.4* 31.3*  MCV 88.2  --  87.7  --  87.2  PLT 100*  --  183  --  179  < > = values in this interval not displayed. Marland Kitchen azithromycin  500 mg Intravenous Q24H  . calcium acetate  1,334 mg Oral TID WC  . cinacalcet  60 mg Oral Q breakfast  . citalopram  20 mg Oral Daily  . [START ON  10/24/2013] darbepoetin  60 mcg Intravenous Q Tue-HD  . [START ON 10/24/2013] doxercalciferol  10 mcg Intravenous Q T,Th,Sa-HD  . heparin subcutaneous  5,000 Units Subcutaneous Q8H  . multivitamin  1 tablet Oral QHS  . [START ON 10/24/2013] oseltamivir  30 mg Oral Q T,Th,Sat-1800  . pantoprazole  20 mg Oral Daily  . piperacillin-tazobactam (ZOSYN)  IV  2.25 g Intravenous Q8H  . simvastatin  10 mg Oral q1800  . [START ON 10/24/2013] vancomycin  1,000 mg Intravenous Q T,Th,Sa-HD     sodium chloride, acetaminophen, ondansetron (ZOFRAN) IV

## 2013-10-23 NOTE — Progress Notes (Signed)
eLink Physician-Brief Progress Note Patient Name: MCKYLE SOLANKI DOB: 1935/02/06 MRN: [id number]  Date of Service  10/23/2013   HPI/Events of Note  Patient c/o of 8/10 back pain.  Requesting pain medication.  eICU Interventions  Plan: PRN tylenol order One time dose of vicodin 5/325 po    Intervention Category Intermediate Interventions: Pain - evaluation and management  Yared Susan 10/23/2013, 12:24 AM

## 2013-10-23 NOTE — Progress Notes (Signed)
Spoke with IR staff this am.  They will notify MD this am and come eval pt for thrombolysis   Brad Mcintosh

## 2013-10-24 ENCOUNTER — Inpatient Hospital Stay (HOSPITAL_COMMUNITY): Payer: Medicare Other

## 2013-10-24 DIAGNOSIS — N189 Chronic kidney disease, unspecified: Secondary | ICD-10-CM

## 2013-10-24 DIAGNOSIS — D631 Anemia in chronic kidney disease: Secondary | ICD-10-CM

## 2013-10-24 DIAGNOSIS — I248 Other forms of acute ischemic heart disease: Secondary | ICD-10-CM

## 2013-10-24 DIAGNOSIS — J9600 Acute respiratory failure, unspecified whether with hypoxia or hypercapnia: Secondary | ICD-10-CM

## 2013-10-24 DIAGNOSIS — E039 Hypothyroidism, unspecified: Secondary | ICD-10-CM

## 2013-10-24 DIAGNOSIS — N039 Chronic nephritic syndrome with unspecified morphologic changes: Secondary | ICD-10-CM

## 2013-10-24 LAB — CBC
HCT: 33.2 % — ABNORMAL LOW (ref 39.0–52.0)
Hemoglobin: 11 g/dL — ABNORMAL LOW (ref 13.0–17.0)
MCH: 28.4 pg (ref 26.0–34.0)
MCHC: 33.1 g/dL (ref 30.0–36.0)
MCV: 85.6 fL (ref 78.0–100.0)
Platelets: 203 10*3/uL (ref 150–400)
RBC: 3.88 MIL/uL — ABNORMAL LOW (ref 4.22–5.81)
RDW: 18.5 % — ABNORMAL HIGH (ref 11.5–15.5)
WBC: 13.4 10*3/uL — AB (ref 4.0–10.5)

## 2013-10-24 LAB — CULTURE, RESPIRATORY: CULTURE: NORMAL

## 2013-10-24 LAB — RENAL FUNCTION PANEL
ALBUMIN: 2.6 g/dL — AB (ref 3.5–5.2)
BUN: 72 mg/dL — ABNORMAL HIGH (ref 6–23)
CHLORIDE: 94 meq/L — AB (ref 96–112)
CO2: 22 mEq/L (ref 19–32)
CREATININE: 10.22 mg/dL — AB (ref 0.50–1.35)
Calcium: 10 mg/dL (ref 8.4–10.5)
GFR calc non Af Amer: 4 mL/min — ABNORMAL LOW (ref >90)
GFR, EST AFRICAN AMERICAN: 5 mL/min — AB (ref >90)
Glucose, Bld: 77 mg/dL (ref 70–99)
POTASSIUM: 5 meq/L (ref 3.7–5.3)
Phosphorus: 5.9 mg/dL — ABNORMAL HIGH (ref 2.3–4.6)
SODIUM: 135 meq/L — AB (ref 137–147)

## 2013-10-24 LAB — CULTURE, RESPIRATORY W GRAM STAIN

## 2013-10-24 MED ORDER — FENTANYL CITRATE 0.05 MG/ML IJ SOLN
INTRAMUSCULAR | Status: AC | PRN
  Administered 2013-10-24 (×2): 50 ug via INTRAVENOUS

## 2013-10-24 MED ORDER — MIDAZOLAM HCL 2 MG/2ML IJ SOLN
INTRAMUSCULAR | Status: AC
  Filled 2013-10-24: qty 4

## 2013-10-24 MED ORDER — FENTANYL CITRATE 0.05 MG/ML IJ SOLN
INTRAMUSCULAR | Status: AC
  Filled 2013-10-24: qty 4

## 2013-10-24 MED ORDER — HEPARIN SODIUM (PORCINE) 1000 UNIT/ML IJ SOLN
INTRAMUSCULAR | Status: AC
  Filled 2013-10-24: qty 1

## 2013-10-24 MED ORDER — ALTEPLASE 100 MG IV SOLR
4.0000 mg | Freq: Once | INTRAVENOUS | Status: AC
  Administered 2013-10-24: 4 mg
  Filled 2013-10-24 (×2): qty 4

## 2013-10-24 MED ORDER — MIDAZOLAM HCL 2 MG/2ML IJ SOLN
INTRAMUSCULAR | Status: AC | PRN
  Administered 2013-10-24 (×2): 1 mg via INTRAVENOUS

## 2013-10-24 MED ORDER — HEPARIN SODIUM (PORCINE) 1000 UNIT/ML IJ SOLN
INTRAMUSCULAR | Status: AC | PRN
  Administered 2013-10-24: 3000 [IU] via INTRAVENOUS

## 2013-10-24 MED ORDER — HYDRALAZINE HCL 20 MG/ML IJ SOLN
10.0000 mg | Freq: Four times a day (QID) | INTRAMUSCULAR | Status: DC | PRN
  Administered 2013-10-24: 10 mg via INTRAVENOUS
  Filled 2013-10-24: qty 1

## 2013-10-24 MED ORDER — METOPROLOL SUCCINATE ER 100 MG PO TB24
100.0000 mg | ORAL_TABLET | Freq: Every day | ORAL | Status: DC
  Administered 2013-10-24 – 2013-10-26 (×3): 100 mg via ORAL
  Filled 2013-10-24 (×3): qty 1

## 2013-10-24 MED ORDER — IOHEXOL 300 MG/ML  SOLN
100.0000 mL | Freq: Once | INTRAMUSCULAR | Status: AC | PRN
  Administered 2013-10-24: 10:00:00 50 mL via INTRAVENOUS

## 2013-10-24 MED ORDER — LABETALOL HCL 5 MG/ML IV SOLN
10.0000 mg | INTRAVENOUS | Status: DC | PRN
  Filled 2013-10-24: qty 4

## 2013-10-24 NOTE — Consult Note (Signed)
[facility]ist [facility]  Patient name: Brad Mcintosh MRN: [id number] DOB: 1935-03-10 Sex: male  REASON FOR CONSULT: Evaluate clotted left arm AVG. Consult is from Dr. Edrick Oh.  HPI: Brad Mcintosh is a 79 y.o. male who underwent thrombolysis of his left upper arm AV graft today by interventional radiology with balloon angioplasty of the venous anastomosis. He was sent for dialysis but the graft clotted during dialysis. Radiology felt that surgical intervention was the only remaining option and vascular surgery was consulted.  The patient is a very poor historian. He is not sure when the graft was put in or how many times it has been worked on. He has had a previous left forearm graft prior to his upper arm graft. He denies any recent uremic symptoms. He denies nausea, vomiting, fatigue, anorexia, or palpitations.  In reviewing his records, he was admitted on 10/21/2012 with syncope and fever. He is currently receiving vancomycin at the time of dialysis. He was admitted with possible sepsis although this workup has been unremarkable and his cultures have remained negative so far.  12/12/2012: The patient underwent a shuntogram with venoplasty of the brachial vein.  Past Medical History  Diagnosis Date  . Hypertension   . Hyperlipidemia   . Cancer     prostate  . Gout   . Anemia     Iron deficiency  . Thyroid disease   . Malnutrition   . Atrial fibrillation     paroxysmal  . Chronic kidney disease     Pt has HD on TUESDAY, THURSDAY and SATURDAY  at [facility]  . Peripheral vascular disease    Family History  Problem Relation Age of Onset  . Hypertension Mother   . Asthma Father   . Cancer Sister     breast  . Kidney disease Brother   . Diabetes Brother    SOCIAL HISTORY: History  Substance Use Topics  . Smoking status: Former Smoker -- 0.50 packs/day    Types: Cigarettes    Quit date: 02/17/1972  . Smokeless tobacco: Never Used  . Alcohol Use: No    No Known Allergies  Current Facility-Administered Medications  Medication Dose Route Frequency Provider Last Rate Last Dose  . 0.9 %  sodium chloride infusion  250 mL Intravenous PRN Raynelle Jan, MD      . acetaminophen (TYLENOL) tablet 650 mg  650 mg Oral Q6H PRN Colbert Coyer, MD      . azithromycin (ZITHROMAX) 500 mg in dextrose 5 % 250 mL IVPB  500 mg Intravenous Q24H Raynelle Jan, MD   500 mg at 10/23/13 2335  . calcium acetate (PHOSLO) capsule 1,334 mg  1,334 mg Oral TID WC Windy Kalata, MD   1,334 mg at 10/24/13 1312  . cinacalcet (SENSIPAR) tablet 60 mg  60 mg Oral Q breakfast Raynelle Jan, MD   60 mg at 10/24/13 0802  . citalopram (CELEXA) tablet 20 mg  20 mg Oral Daily Raynelle Jan, MD   20 mg at 10/24/13 1311  . darbepoetin (ARANESP) injection 60 mcg  60 mcg Intravenous Q Tue-HD Windy Kalata, MD      . doxercalciferol (HECTOROL) injection 10 mcg  10 mcg Intravenous Q T,Th,Sa-HD Windy Kalata, MD      . heparin injection 5,000 Units  5,000 Units Subcutaneous Q8H Hedy Jacob, PA-C   5,000 Units at 10/24/13 1312  . hydrALAZINE (APRESOLINE) injection 10 mg  10 mg Intravenous Q6H PRN Verlee Monte, MD   10 mg at 10/24/13 1602  . HYDROcodone-acetaminophen (NORCO/VICODIN) 5-325 MG per tablet 1-2 tablet  1-2 tablet Oral Q6H PRN Cherene Altes, MD      . multivitamin (RENA-VIT) tablet 1 tablet  1 tablet Oral QHS Windy Kalata, MD   1 tablet at 10/23/13 2225  . ondansetron (ZOFRAN) injection 4 mg  4 mg Intravenous Q8H PRN Raynelle Jan, MD      . pantoprazole (PROTONIX) EC tablet 20 mg  20 mg Oral Daily Raynelle Jan, MD   20 mg at 10/24/13 1311  . piperacillin-tazobactam (ZOSYN) IVPB 2.25 g  2.25 g Intravenous Q8H Darnell Level Mancheril, RPH   2.25 g at 10/24/13 1548  . simvastatin (ZOCOR) tablet 10 mg  10 mg Oral q1800 Raynelle Jan, MD   10 mg at 10/23/13 1728  . vancomycin (VANCOCIN) IVPB 1000 mg/200 mL premix  1,000 mg Intravenous Q T,Th,Sa-HD Jaquita Folds, RPH   1,000 mg at 10/24/13 1312   REVIEW OF SYSTEMS: Valu.Nieves ] denotes positive finding; [  ] denotes negative finding CARDIOVASCULAR:  [ ]  chest pain   [ ]  chest pressure   [ ]  palpitations   [ ]  orthopnea   [ ]  dyspnea on exertion   [ ]  claudication   [ ]  rest pain   [ ]  DVT   [ ]  phlebitis PULMONARY:   [ ]  productive cough   [ ]  asthma   [ ]  wheezing NEUROLOGIC:   [ ]  weakness  [ ]  paresthesias  [ ]  aphasia  [ ]  amaurosis  [ ]  dizziness HEMATOLOGIC:   [ ]  bleeding problems   [ ]  clotting disorders MUSCULOSKELETAL:  [ ]  joint pain   [ ]  joint swelling [ ]  leg swelling GASTROINTESTINAL: [ ]   blood in stool  [ ]   hematemesis GENITOURINARY:  [ ]   dysuria  [ ]   hematuria PSYCHIATRIC:  [ ]  history of major depression INTEGUMENTARY:  [ ]  rashes  [ ]  ulcers CONSTITUTIONAL:  [ ]  fever   [ ]  chills  PHYSICAL EXAM: Filed Vitals:   10/24/13 1155 10/24/13 1347 10/24/13 1557 10/24/13 1653  BP: 169/93 187/94 189/102 187/90  Pulse: 79 81 77 88  Temp:  97.5 F (36.4 C) 97.6 F (36.4 C)   TempSrc:  Oral Oral Oral  Resp: 23 22 19 20   Height:      Weight:      SpO2: 100% 96% 96% 100%   Body mass index is 27.16 kg/(m^2). GENERAL: The patient is a well-nourished male, in no acute distress. The vital signs are documented above. CARDIOVASCULAR: There is a regular rate and rhythm. He has a palpable brachial pulse bilaterally. He has a diminished radial pulse bilaterally. PULMONARY: There is good air exchange bilaterally without wheezing or rales. ABDOMEN: Soft and non-tender with normal pitched bowel sounds.  MUSCULOSKELETAL: He is status post bilateral great toe amputations. NEUROLOGIC: No focal weakness or paresthesias are detected. EXT: he has a clotted left upper arm AV graft which appears significantly degenerative. It does not appear that he has had access in the right arm to  DATA:  Lab Results  Component Value Date   WBC 13.4* 10/24/2013   HGB 11.0* 10/24/2013   HCT 33.2* 10/24/2013    MCV 85.6 10/24/2013   PLT 203 10/24/2013   Lab Results  Component Value Date   NA 135* 10/24/2013   K 5.0 10/24/2013  CL 94* 10/24/2013   CO2 22 10/24/2013   Lab Results  Component Value Date   CREATININE 10.22* 10/24/2013   Lab Results  Component Value Date   INR 1.21 10/21/2013   CBG (last 3)   Recent Labs  10/23/13 0722 10/23/13 1114 10/23/13 1610  GLUCAP 78 116* 93   ARCH AORTOGRAM: in November of 2011 he had an arch aortogram which also evaluated the right upper extremity. At that time he had sluggish flow in the right radial artery and a potentially occluded ulnar artery on the right.  AORTOGRAM WITH RUNOFF: I reviewed his lower extremity arteriogram from 06/01/2011. 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  TODAYS SHUNTOGRAM: I have reviewed his shuntogram from today. The graft looks significantly degenerative. There is a stent in the proximal graft. He has a markedly calcified brachial artery over the entire length of the artery. There is a stenosis in the brachial artery below the anastomosis to the AV graft. There is no central venous stenosis.  2-D echo: Systolic function is mildly reduced. Ejection fraction was in the range of 45-50%. He has grade 1 diastolic dysfunction. He had mild aortic valve stenosis. The left atrium was moderately dilated.  MEDICAL ISSUES:  END-STAGE RENAL DISEASE: The patient has a clotted left upper arm AV graft. He underwent thrombolysis today but the graft clotted later in the day. Looking at his shuntogram, and also by exam, the graft is significantly degenerative. In addition, he has a stent in the proximal graft. 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 I do not think that this graft can be salvaged. I would recommend placement of a catheter for now and evaluate him for new access. However, new access  will be a challenge, and he will need further workup. 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 He has a previous right upper extremity arteriogram as described above which suggest significant right upper extremity arterial occlusive disease. I will order an upper extremity arterial Doppler study. In addition, based on the results of his previous aortogram with runoff, he does not appear to be a candidate for a thigh graft. I will also order a vein map of the right upper extremity. Based on the results of his arterial study in vein map he could potentially be a candidate for access in the right arm or potentially an entirely new right upper arm graft.  I will schedule placement of a tunneled dialysis catheter tomorrow, and then we can arrange for permanent access once his workup is complete.   [place] [facility] [place] [facility]: [phone]

## 2013-10-24 NOTE — Progress Notes (Signed)
ANTIBIOTIC CONSULT NOTE - Follow Up  Pharmacy Consult for Vancomycin and Zosyn Indication: pneumonia  No Known Allergies  Patient Measurements: Height: _0  (180.3 cm) Weight: 194 lb 10.7 oz (88.3 kg) IBW/kg (Calculated) : 75.3  Vital Signs: Temp: 97.4 F (36.3 C) (01/13 0457) Temp src: Oral (01/13 0457) BP: 169/93 mmHg (01/13 1155) Pulse Rate: 79 (01/13 1155) Intake/Output from previous day: 01/12 0701 - 01/13 0700 In: 290 [P.O.:240; IV Piggyback:50] Out: -  Intake/Output from this shift: Total I/O In: 0  Out: 20 [Emesis/NG output:20]  Labs:  Recent Labs  10/22/13 0245 10/22/13 0504 10/22/13 0700 10/23/13 0055 10/24/13 0636  WBC 16.5*  --   --  13.1* 13.4*  HGB 11.1*  --  10.8* 10.2* 11.0*  PLT 183  --   --  179 203  CREATININE  --  6.73*  --  8.47* 10.22*   Estimated Creatinine Clearance: 6.5 ml/min (by C-G formula based on Cr of 10.22).  Microbiology: Recent Results (from the past 720 hour(s))  CULTURE, BLOOD (ROUTINE X 2)     Status: None   Collection Time    10/21/13  4:50 PM      Result Value Range Status   Specimen Description BLOOD RIGHT FOREARM   Final   Special Requests BOTTLES DRAWN AEROBIC AND ANAEROBIC 5CC   Final   Culture  Setup Time     Final   Value: 10/21/2013 22:05     Performed at [facility]   Culture     Final   Value:        BLOOD CULTURE RECEIVED NO GROWTH TO DATE CULTURE WILL BE HELD FOR 5 DAYS BEFORE ISSUING A FINAL NEGATIVE REPORT     Performed at [facility]   Report Status PENDING   Incomplete  CULTURE, BLOOD (ROUTINE X 2)     Status: None   Collection Time    10/21/13  5:05 PM      Result Value Range Status   Specimen Description BLOOD RIGHT WRIST   Final   Special Requests BOTTLES DRAWN AEROBIC AND ANAEROBIC 5CC   Final   Culture  Setup Time     Final   Value: 10/21/2013 22:04     Performed at [facility]   Culture     Final   Value:        BLOOD CULTURE RECEIVED NO GROWTH TO DATE CULTURE  WILL BE HELD FOR 5 DAYS BEFORE ISSUING A FINAL NEGATIVE REPORT     Performed at [facility]   Report Status PENDING   Incomplete  MRSA PCR SCREENING     Status: None   Collection Time    10/21/13 10:14 PM      Result Value Range Status   MRSA by PCR NEGATIVE  NEGATIVE Final   Comment:            The GeneXpert MRSA Assay (FDA     approved for NASAL specimens     only), is one component of a     comprehensive MRSA colonization     surveillance program. It is not     intended to diagnose MRSA     infection nor to guide or     monitor treatment for     MRSA infections.  RESPIRATORY VIRUS PANEL     Status: None   Collection Time    10/21/13 11:04 PM      Result Value Range Status   Source -  RVPAN NASAL SWAB   Corrected   Comment: CORRECTED ON 01/12 AT 0138: PREVIOUSLY REPORTED AS NASAL SWAB   Respiratory Syncytial Virus A NOT DETECTED   Final   Respiratory Syncytial Virus B NOT DETECTED   Final   Influenza A NOT DETECTED   Final   Influenza B NOT DETECTED   Final   Parainfluenza 1 NOT DETECTED   Final   Parainfluenza 2 NOT DETECTED   Final   Parainfluenza 3 NOT DETECTED   Final   Metapneumovirus NOT DETECTED   Final   Rhinovirus NOT DETECTED   Final   Adenovirus NOT DETECTED   Final   Influenza A H1 NOT DETECTED   Final   Influenza A H3 NOT DETECTED   Final   Comment: (NOTE)           Normal Reference Range for each Analyte: NOT DETECTED     Testing performed using the Luminex xTAG Respiratory Viral Panel test     kit.     This test was developed and its performance characteristics determined     by [facility]. It has not been cleared or approved by the [place]     Food and Drug Administration. This test is used for clinical purposes.     It should not be regarded as investigational or for research. This     laboratory is certified under the [place] (CLIA) as qualified to perform high complexity     clinical  laboratory testing.     Performed at [facility] EXPECTORATED SPUTUM-ASSESSMENT     Status: None   Collection Time    10/22/13  6:26 AM      Result Value Range Status   Specimen Description SPUTUM   Final   Special Requests Normal   Final   Sputum evaluation     Final   Value: THIS SPECIMEN IS ACCEPTABLE. RESPIRATORY CULTURE REPORT TO FOLLOW.   Report Status 10/22/2013 FINAL   Final  CULTURE, RESPIRATORY (NON-EXPECTORATED)     Status: None   Collection Time    10/22/13  6:26 AM      Result Value Range Status   Specimen Description SPUTUM   Final   Special Requests NONE   Final   Gram Stain     Final   Value: FEW WBC PRESENT, PREDOMINANTLY PMN     NO SQUAMOUS EPITHELIAL CELLS SEEN     FEW GRAM NEGATIVE RODS     RARE GRAM POSITIVE COCCI     IN PAIRS RARE GRAM POSITIVE RODS   Culture     Final   Value: NORMAL OROPHARYNGEAL FLORA     Performed at [facility]   Report Status 10/24/2013 FINAL   Final   Medical History: Past Medical History  Diagnosis Date  . Hypertension   . Hyperlipidemia   . Cancer     prostate  . Gout   . Anemia     Iron deficiency  . Thyroid disease   . Malnutrition   . Atrial fibrillation     paroxysmal  . Chronic kidney disease     Pt has HD on TUESDAY, THURSDAY and SATURDAY  at [facility]  . Peripheral vascular disease    Medications:  Scheduled:  . azithromycin  500 mg Intravenous Q24H  . calcium acetate  1,334 mg Oral TID WC  . cinacalcet  60 mg Oral Q breakfast  .  citalopram  20 mg Oral Daily  . darbepoetin  60 mcg Intravenous Q Tue-HD  . doxercalciferol  10 mcg Intravenous Q T,Th,Sa-HD  . heparin subcutaneous  5,000 Units Subcutaneous Q8H  . multivitamin  1 tablet Oral QHS  . pantoprazole  20 mg Oral Daily  . piperacillin-tazobactam (ZOSYN)  IV  2.25 g Intravenous Q8H  . simvastatin  10 mg Oral q1800  . vancomycin  1,000 mg Intravenous Q T,Th,Sa-HD   Assessment: 79yo male, ESRD-TTS HD with sepsis,  presenting following a syncopal episode and became febrile en route to ED.  His EDW per info from HD center is 84.5kg.  Pt has received Cefepime 2gm in ED, no Vancomycin at this time, and no antibiotics at HD center.  1/13 - Currently continues on broad spectrum antibiotics with Vancomycin, Zosyn and Azithromycin.  He is tolerating these without noted complications.  Culture data remains negative to date.  CXR = improved left lung airspace disease/pneumonia.    Goal of Therapy:  pre-HD Vancomycin level 15-25  Plan:  1-  Continue IV Zosyn 2.25 gm every 8 hours 2-  Continue IV Vancomycin 1078m IV qHD 3-  Continue Azithromycin 5074mIV q24hr 3-  F/U cx data 4-  SS Vancomycin level 5-  Consider de-escalation of therapy as appropriate.  NiRober MinionPharmD., MS Clinical Pharmacist Pager:  [phone] you for allowing pharmacy to be part of this patients care team.

## 2013-10-24 NOTE — Progress Notes (Signed)
  [facility] Progress Note   Subjective: no complaints, NPO for procedure. Doing well, afeb, cough better, getting OOB, etc.  Filed Vitals:   10/23/13 2000 10/23/13 2333 10/24/13 0457 10/24/13 0658  BP:  147/87 159/89   Pulse:  85 75   Temp: 97.9 F (36.6 C) 98 F (36.7 C) 97.4 F (36.3 C)   TempSrc: Oral Oral Oral   Resp:  20 20   Height:      Weight:    88.3 kg (194 lb 10.7 oz)  SpO2:  100% 100%   Exam Alert, no distress No jvd Chest clear bilat RRR loud 2/6 M rusb radiating R carotid Abd soft, nt/nd No LE or UE edema No neuro deficits, ox3 LUA AVG no bruit  [facility]  DW 84.5kg  Profile 4   BFR 400     Assessment/Plan: 1. LLL PNA: on bs abx per primary 2. Clotted L arm AV graft: IR to address today 3. Heart murmur: echo pend 4. ESRD: HD today after procedure 5. HTN/volume: up 4-5 kg by wt, reweigh prior to HD today 6. Anemia:  cont EPO 7. Sec HPTH: on sensipar and hectorol, phoslo ^'d to 2AC 8. Dispo: could potentially be discharged after HD today if access situation is satisfactory  Kelly Splinter MD pager [phone]    cell [phone] 10/24/2013, 8:34 AM   Recent Labs Lab 10/22/13 0504 10/22/13 1130 10/23/13 0055 10/24/13 0636  NA 140  --  135* 135*  K 4.9  --  4.3 5.0  CL 99  --  95* 94*  CO2 24  --  24 22  GLUCOSE 127*  --  92 77  BUN 40*  --  60* 72*  CREATININE 6.73*  --  8.47* 10.22*  CALCIUM 8.8  --  9.7 10.0  PHOS  --  5.2*  --  5.9*    Recent Labs Lab 10/21/13 1644 10/24/13 0636  AST 7  --   ALT <5  --   ALKPHOS 25*  --   BILITOT 0.4  --   PROT 5.4*  --   ALBUMIN 2.1* 2.6*    Recent Labs Lab 10/21/13 1644  10/22/13 0245 10/22/13 0700 10/23/13 0055 10/24/13 0636  WBC 5.0  --  16.5*  --  13.1* 13.4*  NEUTROABS 4.6  --   --   --   --   --   HGB 5.2*  < > 11.1* 10.8* 10.2* 11.0*  HCT 16.4*  < > 34.8* 33.4* 31.3* 33.2*  MCV 88.2  --  87.7  --  87.2 85.6  PLT 100*  --  183  --  179 203  < > =  values in this interval not displayed. Marland Kitchen azithromycin  500 mg Intravenous Q24H  . calcium acetate  1,334 mg Oral TID WC  . cinacalcet  60 mg Oral Q breakfast  . citalopram  20 mg Oral Daily  . darbepoetin  60 mcg Intravenous Q Tue-HD  . doxercalciferol  10 mcg Intravenous Q T,Th,Sa-HD  . heparin subcutaneous  5,000 Units Subcutaneous Q8H  . multivitamin  1 tablet Oral QHS  . pantoprazole  20 mg Oral Daily  . piperacillin-tazobactam (ZOSYN)  IV  2.25 g Intravenous Q8H  . simvastatin  10 mg Oral q1800  . vancomycin  1,000 mg Intravenous Q T,Th,Sa-HD     sodium chloride, acetaminophen, HYDROcodone-acetaminophen, ondansetron (ZOFRAN) IV

## 2013-10-24 NOTE — Procedures (Signed)
Declot L UA graft 7 and 52mm venous PTA No complication No blood loss. See complete dictation in [facility]

## 2013-10-24 NOTE — Consult Note (Signed)
Brad Mcintosh is an 79 y.o. male.   Chief Complaint: Abnormal cardiac enzymes. HPI: 79 year old male with ESRD, hypertension, valvular heart disease. Gout, anemia peripheral vascular disease had syncopal episode and possible pneumonia treated with IV fluids and antibiotics. His EKG showed anterolateral ischemia and he has elevated cardiac enzymes suggestive of NSTEMI. He has mild LV systolic dysfunction with mild AS and MS with mild AI, MR, TR. There is also some suggestion of clot in LV. No chest pain.  Past Medical History  Diagnosis Date  . Hypertension   . Hyperlipidemia   . Cancer     prostate  . Gout   . Anemia     Iron deficiency  . Thyroid disease   . Malnutrition   . Atrial fibrillation     paroxysmal  . Chronic kidney disease     Pt has HD on TUESDAY, THURSDAY and SATURDAY  at [facility]  . Peripheral vascular disease       Past Surgical History  Procedure Laterality Date  . Av fistula placement  06/2006    left forearm  . Amputation      right finger  . Incise and drain abcess      chronic complex infection right long finger  . Insertion of dialysis catheter  05/30/2010    right internal jugular Palindrome catheter  . Aortogram  06/01/11    w/ runoff    Family History  Problem Relation Age of Onset  . Hypertension Mother   . Asthma Father   . Cancer Sister     breast  . Kidney disease Brother   . Diabetes Brother    Social History:  reports that he quit smoking about 41 years ago. His smoking use included Cigarettes. He smoked 0.50 packs per day. He has never used smokeless tobacco. He reports that he does not drink alcohol or use illicit drugs.  Allergies: No Known Allergies  Medications Prior to Admission  Medication Sig Dispense Refill  . calcium acetate (PHOSLO) 667 MG capsule Take 667 mg by mouth 3 (three) times daily with meals.       . cinacalcet (SENSIPAR) 60 MG tablet Take 60 mg by mouth daily. Takes 1 tablet with evening meal      . citalopram  (CELEXA) 20 MG tablet Take 20 mg by mouth at bedtime. Depression      . cyclobenzaprine (FLEXERIL) 10 MG tablet Take one at bed time for back pain and spasms  30 tablet  0  . dexlansoprazole (DEXILANT) 60 MG capsule Take 60 mg by mouth daily.      Marland Kitchen gabapentin (NEURONTIN) 100 MG capsule Take 100 mg by mouth 2 (two) times daily.       Marland Kitchen HYDROcodone-acetaminophen (NORCO) 5-325 MG per tablet Take 1 tablet by mouth at bedtime as needed. pain      . metoprolol (TOPROL-XL) 100 MG 24 hr tablet Take 100 mg by mouth daily.        . simvastatin (ZOCOR) 10 MG tablet Take 10 mg by mouth at bedtime.      . traMADol (ULTRAM) 50 MG tablet Take 50 mg by mouth every 6 (six) hours as needed for moderate pain.       Marland Kitchen acetaminophen (TYLENOL) 160 MG/5ML suspension Take 1,000 mg by mouth every 6 (six) hours as needed.      . ondansetron (ZOFRAN-ODT) 4 MG disintegrating tablet Take 4 mg by mouth every 8 (eight) hours as needed for nausea  or vomiting.        Results for orders placed during the hospital encounter of 10/21/13 (from the past 48 hour(s))  INFLUENZA PANEL BY PCR (TYPE A & B, H1N1)     Status: None   Collection Time    10/22/13 11:08 PM      Result Value Range   Influenza A By PCR NEGATIVE  NEGATIVE   Influenza B By PCR NEGATIVE  NEGATIVE   H1N1 flu by pcr NOT DETECTED  NOT DETECTED   Comment:            The Xpert Flu assay (FDA approved for     nasal aspirates or washes and     nasopharyngeal swab specimens), is     intended as an aid in the diagnosis of     influenza and should not be used as     a sole basis for treatment.  TROPONIN I     Status: Abnormal   Collection Time    10/23/13 12:55 AM      Result Value Range   Troponin I 3.90 (*) <0.30 ng/mL   Comment:            Due to the release kinetics of cTnI,     a negative result within the first hours     of the onset of symptoms does not rule out     myocardial infarction with certainty.     If myocardial infarction is still suspected,      repeat the test at appropriate intervals.     CRITICAL VALUE NOTED.  VALUE IS CONSISTENT WITH PREVIOUSLY REPORTED AND CALLED VALUE.     Performed at [facility]  CBC     Status: Abnormal   Collection Time    10/23/13 12:55 AM      Result Value Range   WBC 13.1 (*) 4.0 - 10.5 K/uL   RBC 3.59 (*) 4.22 - 5.81 MIL/uL   Hemoglobin 10.2 (*) 13.0 - 17.0 g/dL   HCT 31.3 (*) 39.0 - 52.0 %   MCV 87.2  78.0 - 100.0 fL   MCH 28.4  26.0 - 34.0 pg   MCHC 32.6  30.0 - 36.0 g/dL   RDW 18.4 (*) 11.5 - 15.5 %   Platelets 179  150 - 400 K/uL  BASIC METABOLIC PANEL     Status: Abnormal   Collection Time    10/23/13 12:55 AM      Result Value Range   Sodium 135 (*) 137 - 147 mEq/L   Potassium 4.3  3.7 - 5.3 mEq/L   Chloride 95 (*) 96 - 112 mEq/L   CO2 24  19 - 32 mEq/L   Glucose, Bld 92  70 - 99 mg/dL   BUN 60 (*) 6 - 23 mg/dL   Creatinine, Ser 8.47 (*) 0.50 - 1.35 mg/dL   Calcium 9.7  8.4 - 10.5 mg/dL   GFR calc non Af Amer 5 (*) >90 mL/min   GFR calc Af Amer 6 (*) >90 mL/min   Comment: (NOTE)     The eGFR has been calculated using the CKD EPI equation.     This calculation has not been validated in all clinical situations.     eGFR's persistently <90 mL/min signify possible Chronic Kidney     Disease.  PROCALCITONIN     Status: None   Collection Time    10/23/13 12:55 AM      Result Value Range  Procalcitonin >175.00     Comment:            Interpretation:     PCT >= 10 ng/mL:     Important systemic inflammatory response,     almost exclusively due to severe bacterial     sepsis or septic shock.     (NOTE)             ICU PCT Algorithm               Non ICU PCT Algorithm        ----------------------------     ------------------------------             PCT < 0.25 ng/mL                 PCT < 0.1 ng/mL         Stopping of antibiotics            Stopping of antibiotics           strongly encouraged.               strongly encouraged.         ----------------------------     ------------------------------           PCT level decrease by               PCT < 0.25 ng/mL           >= 80% from peak PCT           OR PCT 0.25 - 0.5 ng/mL          Stopping of antibiotics                                                 encouraged.         Stopping of antibiotics               encouraged.        ----------------------------     ------------------------------           PCT level decrease by              PCT >= 0.25 ng/mL           < 80% from peak PCT            AND PCT >= 0.5 ng/mL            Continuing antibiotics                                                  encouraged.           Continuing antibiotics                encouraged.        ----------------------------     ------------------------------         PCT level increase compared          PCT > 0.5 ng/mL             with peak PCT AND              PCT >= 0.5 ng/mL  Escalation of antibiotics                                              strongly encouraged.          Escalation of antibiotics            strongly encouraged.  LACTIC ACID, PLASMA     Status: None   Collection Time    10/23/13  3:19 AM      Result Value Range   Lactic Acid, Venous 1.2  0.5 - 2.2 mmol/L  GLUCOSE, CAPILLARY     Status: None   Collection Time    10/23/13  7:22 AM      Result Value Range   Glucose-Capillary 78  70 - 99 mg/dL  GLUCOSE, CAPILLARY     Status: Abnormal   Collection Time    10/23/13 11:14 AM      Result Value Range   Glucose-Capillary 116 (*) 70 - 99 mg/dL  TSH     Status: None   Collection Time    10/23/13 11:35 AM      Result Value Range   TSH 1.730  0.350 - 4.500 uIU/mL   Comment: Performed at [place], CAPILLARY     Status: None   Collection Time    10/23/13  4:10 PM      Result Value Range   Glucose-Capillary 93  70 - 99 mg/dL   Comment 1 Notify RN    RENAL FUNCTION PANEL     Status: Abnormal   Collection Time    10/24/13  6:36 AM       Result Value Range   Sodium 135 (*) 137 - 147 mEq/L   Potassium 5.0  3.7 - 5.3 mEq/L   Chloride 94 (*) 96 - 112 mEq/L   CO2 22  19 - 32 mEq/L   Glucose, Bld 77  70 - 99 mg/dL   BUN 72 (*) 6 - 23 mg/dL   Creatinine, Ser 10.22 (*) 0.50 - 1.35 mg/dL   Calcium 10.0  8.4 - 10.5 mg/dL   Phosphorus 5.9 (*) 2.3 - 4.6 mg/dL   Albumin 2.6 (*) 3.5 - 5.2 g/dL   GFR calc non Af Amer 4 (*) >90 mL/min   GFR calc Af Amer 5 (*) >90 mL/min   Comment: (NOTE)     The eGFR has been calculated using the CKD EPI equation.     This calculation has not been validated in all clinical situations.     eGFR's persistently <90 mL/min signify possible Chronic Kidney     Disease.  CBC     Status: Abnormal   Collection Time    10/24/13  6:36 AM      Result Value Range   WBC 13.4 (*) 4.0 - 10.5 K/uL   RBC 3.88 (*) 4.22 - 5.81 MIL/uL   Hemoglobin 11.0 (*) 13.0 - 17.0 g/dL   HCT 33.2 (*) 39.0 - 52.0 %   MCV 85.6  78.0 - 100.0 fL   MCH 28.4  26.0 - 34.0 pg   MCHC 33.1  30.0 - 36.0 g/dL   RDW 18.5 (*) 11.5 - 15.5 %   Platelets 203  150 - 400 K/uL   Ir Pta Venous Left  10/24/2013   CLINICAL DATA:  Occluded dialysis graft  TECHNIQUE: DIALYSIS GRAFT DECLOT  VENOUS ANGIOPLASTY  ULTRASOUND GUIDANCE FOR VASCULAR ACCESS X2  The procedure, risks (including but not limited to bleeding, infection, organ damage ), benefits, and alternatives were explained to the patient. Questions regarding the procedure were encouraged and answered. The patient understands and consents to the procedure.  Intravenous Fentanyl and Versed were administered as conscious sedation during continuous cardiorespiratory monitoring by the radiology RN, with a total moderate sedation time of sixty minutes.  The left upper arm straight synthetic graft just central to the arterial anastomosis was accessed antegrade with a 21-gauge micropuncture needle under real-time ultrasonic guidance after the overlying skin prepped with Betadine, draped in usual sterile  fashion, infiltrated locally with 1% lidocaine. Needle exchanged over 018 guidewire for transitional dilator through which $RemoveBefor'4mg'wWaixEbmYSEn$  t-PA was administered. Ultrasound imaging documentation was saved. Through the dilator, a Bentson wire was advanced to the venous anastomosis. Over this a 23F sheath was placed, through which a 5 [place] Kumpe catheter was advanced for outflow venography. This showed patency of the outflow venous system through the SVC. 3000 units heparin were administered. The Arrow PTD device was used to macerate thrombus in the graft.  In similar fashion, the more central aspect of the graft near the venous anastomosis was accessed retrograde under ultrasound with a micropuncture needle, exchanged for a transitional dilator. The venous limb dilator was exchanged in similar fashion for a 6 French vascular sheath. With a Kumpe catheter, a guidewire was placed across the arterial anastomosis. Over the wire, a Fogarty catheter was used to dislodge the platelet plug into the gRaft, where it was macerated. The Fogarty was used again to remove any residual platelet plug from the arterial anastomosis. Injection showed clearance of thrombus from the arterial anastomosis with atheromatous irregularity of the native arterial circulation but no intraluminal filling defect. Balloon angioplasty of the graft including intra graft stent and the venous anastomosis was performed using a 7 mm x 4 cm Conquest angioplasty balloon with good response. Balloon was removed and injection showed patency of the anastomosis, without extravasation or other apparent complication. There is diffuse narrowing of the central aspect of the graft and some thrombus at the venous anastomosis. Because of difficulty passing the Treretola device across the venous anastomosis, I elected to use the Angiojet device over a guidewire. 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 There is still some  residual stenosis of the venous anastomosis. this was further dilated with an 8 mm x 4 cm Conquest balloon with good response. Follow venography shows no significant residual or recurrent stenosis. No extravasation, dissection, or other apparent complication.  A follow shuntogram was performed, demonstrating good flow through the outflow vein, no extravasation. Reflux across the arterial anastomosis demonstrate this to be widely patent, with unremarkable appearance of the visualized native arterial circulation. The catheter and sheaths were then removed and hemostasis achieved with 2-0 Ethilon sutures. Patient tolerated procedure well.  COMPARISON:  None  FLUOROSCOPY TIME:  18 min 48 seconds  ACCESS: Premature reocclusion may necessitate surgical consultation and revision  IMPRESSION: 1. Technically successful declot of left upper arm straight synthetic hemodialysis graft. 2. Technically successful balloon angioplasty of venous anastomotic stenosis up to 8 mm.   Electronically Signed   By: Arne Cleveland M.D.   On: 10/24/2013 15:48   Ir Av Dialysis Graft Declot  10/24/2013   CLINICAL DATA:  Occluded dialysis graft  TECHNIQUE: DIALYSIS GRAFT DECLOT  VENOUS ANGIOPLASTY  ULTRASOUND GUIDANCE FOR VASCULAR ACCESS X2  The procedure,  risks (including but not limited to bleeding, infection, organ damage ), benefits, and alternatives were explained to the patient. Questions regarding the procedure were encouraged and answered. The patient understands and consents to the procedure.  Intravenous Fentanyl and Versed were administered as conscious sedation during continuous cardiorespiratory monitoring by the radiology RN, with a total moderate sedation time of sixty minutes.  The left upper arm straight synthetic graft just central to the arterial anastomosis was accessed antegrade with a 21-gauge micropuncture needle under real-time ultrasonic guidance after the overlying skin prepped with Betadine, draped in usual sterile  fashion, infiltrated locally with 1% lidocaine. Needle exchanged over 018 guidewire for transitional dilator through which $RemoveBefor'4mg'hKVGuSFpYMax$  t-PA was administered. Ultrasound imaging documentation was saved. Through the dilator, a Bentson wire was advanced to the venous anastomosis. Over this a 57F sheath was placed, through which a 5 [place] Kumpe catheter was advanced for outflow venography. This showed patency of the outflow venous system through the SVC. 3000 units heparin were administered. The Arrow PTD device was used to macerate thrombus in the graft.  In similar fashion, the more central aspect of the graft near the venous anastomosis was accessed retrograde under ultrasound with a micropuncture needle, exchanged for a transitional dilator. The venous limb dilator was exchanged in similar fashion for a 6 French vascular sheath. With a Kumpe catheter, a guidewire was placed across the arterial anastomosis. Over the wire, a Fogarty catheter was used to dislodge the platelet plug into the gRaft, where it was macerated. The Fogarty was used again to remove any residual platelet plug from the arterial anastomosis. Injection showed clearance of thrombus from the arterial anastomosis with atheromatous irregularity of the native arterial circulation but no intraluminal filling defect. Balloon angioplasty of the graft including intra graft stent and the venous anastomosis was performed using a 7 mm x 4 cm Conquest angioplasty balloon with good response. Balloon was removed and injection showed patency of the anastomosis, without extravasation or other apparent complication. There is diffuse narrowing of the central aspect of the graft and some thrombus at the venous anastomosis. Because of difficulty passing the Treretola device across the venous anastomosis, I elected to use the Angiojet device over a guidewire. 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 There is still some  residual stenosis of the venous anastomosis. this was further dilated with an 8 mm x 4 cm Conquest balloon with good response. Follow venography shows no significant residual or recurrent stenosis. No extravasation, dissection, or other apparent complication.  A follow shuntogram was performed, demonstrating good flow through the outflow vein, no extravasation. Reflux across the arterial anastomosis demonstrate this to be widely patent, with unremarkable appearance of the visualized native arterial circulation. The catheter and sheaths were then removed and hemostasis achieved with 2-0 Ethilon sutures. Patient tolerated procedure well.  COMPARISON:  None  FLUOROSCOPY TIME:  18 min 48 seconds  ACCESS: Premature reocclusion may necessitate surgical consultation and revision  IMPRESSION: 1. Technically successful declot of left upper arm straight synthetic hemodialysis graft. 2. Technically successful balloon angioplasty of venous anastomotic stenosis up to 8 mm.   Electronically Signed   By: Arne Cleveland M.D.   On: 10/24/2013 15:48   Ir Angio Av Shunt Addl Access  10/24/2013   CLINICAL DATA:  Occluded dialysis graft  TECHNIQUE: DIALYSIS GRAFT DECLOT  VENOUS ANGIOPLASTY  ULTRASOUND GUIDANCE FOR VASCULAR ACCESS X2  The procedure, risks (including but not limited to bleeding, infection,  organ damage ), benefits, and alternatives were explained to the patient. Questions regarding the procedure were encouraged and answered. The patient understands and consents to the procedure.  Intravenous Fentanyl and Versed were administered as conscious sedation during continuous cardiorespiratory monitoring by the radiology RN, with a total moderate sedation time of sixty minutes.  The left upper arm straight synthetic graft just central to the arterial anastomosis was accessed antegrade with a 21-gauge micropuncture needle under real-time ultrasonic guidance after the overlying skin prepped with Betadine, draped in usual  sterile fashion, infiltrated locally with 1% lidocaine. Needle exchanged over 018 guidewire for transitional dilator through which $RemoveBefor'4mg'NpkAffdhKMlL$  t-PA was administered. Ultrasound imaging documentation was saved. Through the dilator, a Bentson wire was advanced to the venous anastomosis. Over this a 65F sheath was placed, through which a 5 [place] Kumpe catheter was advanced for outflow venography. This showed patency of the outflow venous system through the SVC. 3000 units heparin were administered. The Arrow PTD device was used to macerate thrombus in the graft.  In similar fashion, the more central aspect of the graft near the venous anastomosis was accessed retrograde under ultrasound with a micropuncture needle, exchanged for a transitional dilator. The venous limb dilator was exchanged in similar fashion for a 6 French vascular sheath. With a Kumpe catheter, a guidewire was placed across the arterial anastomosis. Over the wire, a Fogarty catheter was used to dislodge the platelet plug into the gRaft, where it was macerated. The Fogarty was used again to remove any residual platelet plug from the arterial anastomosis. Injection showed clearance of thrombus from the arterial anastomosis with atheromatous irregularity of the native arterial circulation but no intraluminal filling defect. Balloon angioplasty of the graft including intra graft stent and the venous anastomosis was performed using a 7 mm x 4 cm Conquest angioplasty balloon with good response. Balloon was removed and injection showed patency of the anastomosis, without extravasation or other apparent complication. There is diffuse narrowing of the central aspect of the graft and some thrombus at the venous anastomosis. Because of difficulty passing the Treretola device across the venous anastomosis, I elected to use the Angiojet device over a guidewire. 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 There is  still some residual stenosis of the venous anastomosis. this was further dilated with an 8 mm x 4 cm Conquest balloon with good response. Follow venography shows no significant residual or recurrent stenosis. No extravasation, dissection, or other apparent complication.  A follow shuntogram was performed, demonstrating good flow through the outflow vein, no extravasation. Reflux across the arterial anastomosis demonstrate this to be widely patent, with unremarkable appearance of the visualized native arterial circulation. The catheter and sheaths were then removed and hemostasis achieved with 2-0 Ethilon sutures. Patient tolerated procedure well.  COMPARISON:  None  FLUOROSCOPY TIME:  18 min 48 seconds  ACCESS: Premature reocclusion may necessitate surgical consultation and revision  IMPRESSION: 1. Technically successful declot of left upper arm straight synthetic hemodialysis graft. 2. Technically successful balloon angioplasty of venous anastomotic stenosis up to 8 mm.   Electronically Signed   By: Arne Cleveland M.D.   On: 10/24/2013 15:48   Ir US Guide Vasc Access Left  10/24/2013   CLINICAL DATA:  Occluded dialysis graft  TECHNIQUE: DIALYSIS GRAFT DECLOT  VENOUS ANGIOPLASTY  ULTRASOUND GUIDANCE FOR VASCULAR ACCESS X2  The procedure, risks (including but not limited to bleeding, infection, organ damage ), benefits, and alternatives were explained  to the patient. Questions regarding the procedure were encouraged and answered. The patient understands and consents to the procedure.  Intravenous Fentanyl and Versed were administered as conscious sedation during continuous cardiorespiratory monitoring by the radiology RN, with a total moderate sedation time of sixty minutes.  The left upper arm straight synthetic graft just central to the arterial anastomosis was accessed antegrade with a 21-gauge micropuncture needle under real-time ultrasonic guidance after the overlying skin prepped with Betadine, draped in  usual sterile fashion, infiltrated locally with 1% lidocaine. Needle exchanged over 018 guidewire for transitional dilator through which $RemoveBefor'4mg'MTFrBeEhgFHd$  t-PA was administered. Ultrasound imaging documentation was saved. Through the dilator, a Bentson wire was advanced to the venous anastomosis. Over this a 75F sheath was placed, through which a 5 [place] Kumpe catheter was advanced for outflow venography. This showed patency of the outflow venous system through the SVC. 3000 units heparin were administered. The Arrow PTD device was used to macerate thrombus in the graft.  In similar fashion, the more central aspect of the graft near the venous anastomosis was accessed retrograde under ultrasound with a micropuncture needle, exchanged for a transitional dilator. The venous limb dilator was exchanged in similar fashion for a 6 French vascular sheath. With a Kumpe catheter, a guidewire was placed across the arterial anastomosis. Over the wire, a Fogarty catheter was used to dislodge the platelet plug into the gRaft, where it was macerated. The Fogarty was used again to remove any residual platelet plug from the arterial anastomosis. Injection showed clearance of thrombus from the arterial anastomosis with atheromatous irregularity of the native arterial circulation but no intraluminal filling defect. Balloon angioplasty of the graft including intra graft stent and the venous anastomosis was performed using a 7 mm x 4 cm Conquest angioplasty balloon with good response. Balloon was removed and injection showed patency of the anastomosis, without extravasation or other apparent complication. There is diffuse narrowing of the central aspect of the graft and some thrombus at the venous anastomosis. Because of difficulty passing the Treretola device across the venous anastomosis, I elected to use the Angiojet device over a guidewire. 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 There  is still some residual stenosis of the venous anastomosis. this was further dilated with an 8 mm x 4 cm Conquest balloon with good response. Follow venography shows no significant residual or recurrent stenosis. No extravasation, dissection, or other apparent complication.  A follow shuntogram was performed, demonstrating good flow through the outflow vein, no extravasation. Reflux across the arterial anastomosis demonstrate this to be widely patent, with unremarkable appearance of the visualized native arterial circulation. The catheter and sheaths were then removed and hemostasis achieved with 2-0 Ethilon sutures. Patient tolerated procedure well.  COMPARISON:  None  FLUOROSCOPY TIME:  18 min 48 seconds  ACCESS: Premature reocclusion may necessitate surgical consultation and revision  IMPRESSION: 1. Technically successful declot of left upper arm straight synthetic hemodialysis graft. 2. Technically successful balloon angioplasty of venous anastomotic stenosis up to 8 mm.   Electronically Signed   By: Arne Cleveland M.D.   On: 10/24/2013 15:48    ROS Constitutional: Negative for fever, fatigue, chills, weight loss,  Respiratory: has non productive cough (scant white sputum), no sob, denies hemoptysis, sputum production, wheezing and no stridor.  Cardiovascular: Negative for chest pain, palpitations, orthopnea, claudication, leg swelling and PND.  Gastrointestinal: Reports melena for the past 3 weeks. Today had nausea and vomiting in ED.  Possible heart burn (chronic). Negative for abdominal pain, diarrhea, constipation, hematachezia  Genitourinary: Patient Anuric due to ESRD  Musculoskeletal: Negative for myalgias, joint pain and falls.  Skin: Negative for itching and rash.  Neurological: Dizziness today with LOC (patient remembers only at HD unit) Negative for tremors, sensory change, focal weakness, seizures, Or headaches.  Blood pressure 157/85, pulse 86, temperature 97.6 F (36.4 C), temperature  source Oral, resp. rate 20, height $RemoveBe'5\' 11"'sVulkwRDq$  (1.803 m), weight 88.3 kg (194 lb 10.7 oz), SpO2 100.00%.  PHYSICAL EXAMINATION:  General: Lethargic, febrile, no acute distress, no diaphoresis. Hypotensive with systolic in the 99'Y  Neuro: Patient moves all extremities, no focal deficits, lethargic and in/out of sleep during interview  HEENT: Noted conjunctivits, PERRL, EOMI, no scleral edema  Cardiovascular: Tachycardic into the 130s during initial interview, no mrg, no heaves, normal s1 s2  Lungs: Clear on exam, unable to appreciate any rales or rhonchi even in left lung. Decrease breath sounds in left lung  Abdomen: Nontender, nondistended. Normal bowel sounds.  Musculoskeletal: No soreness, tenderness, or edema.  Skin: No rashes, sores, or skin breakdown  Assessment/Plan Possible NSTEMI ESRD with hemodialysis PVD DM, II-diet controlled HTN Aortic valve stenosis with regurgitation, mild Mitral valve stenosis with regurgitation, mild Tricuspid valve regurgitation Possible clot in Left atrium  TEE in AM. Cardiac catheterization soon.  Vandora Jaskulski S 10/24/2013, 6:31 PM

## 2013-10-24 NOTE — Progress Notes (Signed)
Echocardiogram 2D Echocardiogram has been performed.  Brad Mcintosh 10/24/2013, 3:13 PM

## 2013-10-24 NOTE — Progress Notes (Signed)
[facility]   Brad Mcintosh VHQ:[id number] DOB: Oct 05, 1935 DOA: 10/21/2013 PCP: [name], MD  HPI/Subjective: Feels much better, denies any specific complaints. Patient for A-V fistula declotting today her IR.  Brief narrative: 79 year old male patient who had a syncopal episode at hemodialysis on the day of admission. Upon presentation to the emergency department he was found to have a temperature of 100.4. Chest x-ray was concerning for left basilar pneumonia. Patient was found to be hypotensive with tachycardia with apparent severe anemia (Hgb 5.2). Procalcitonin was greater than 175 - admission troponin was 2.43. EKG was nonischemic. Lactic acid was 3. He was admitted to the ICU and transfused.  After admission troponin peaked to 7.38. Procalcitonin has remained greater than 175. He subsequently developed leukocytosis. At time of admission he was begun empirically on broad-spectrum antibiotics to cover for pneumonia. Subsequent cultures have been negative for growth. Because the patient is on dialysis Nephrology was consulted. Due to the anemia GI was consulted the patient was subsequently heme-negative and it was surmised that initial hemoglobin of 5.2 may have been spurious. Patient's blood pressure improved without need for pressors.  Patient stabilized within 36 hours and was deemed appropriate to transfer out of the step down unit. After admission patient's dialysis access was found to be clotted so [facility] was consulted. Plans are to proceed with probable lysis of graft today.  Assessment/Plan:    Sepsis -Sepsis physiology resolved -Source likely pulmonary -Blood culture no growth to date    Acute respiratory failure with hypoxia/LLL pneumonia -Weaning oxygen slowly -Continue empiric anbx's -Sputum cx pending -Respiratory virus panel negative so discontinue Tamiflu    CKD (chronic kidney disease) stage V requiring chronic dialysis -Per nephrology -Plan dialysis once  graft declotted    Demand ischemia -Troponin reached a peak of 7.38, currently trending down. -No apparent ischemic symptoms such as chest pain or dyspnea on exertion -Check 2-D echocardiogram if RWMA, Dr. Doylene Canard to see. -At time of admission patient had accelerated junctional rhythm nonspecific T wave abnormality-followup EKG 10/22/2013 with first degree AV block and ST abnormalities in the inferior lateral leads concerning for possible ischemia    Systolic murmur -Located aortic area and if stenosis present could explain patient's syncopal event -Echo pending    Melena -GI followed -Heme negative -No recurrence and GI suspects initial hemoglobin spurious result    Anemia in CKD (chronic kidney disease) -Hemoglobin stable at 10.2    Hypothyroidism -Continue Synthroid -check TSH  DVT prophylaxis: Subcutaneous heparin Code Status: Full Family Communication: No family at bedside Disposition Plan/Expected LOS: Transfer to telemetry  Consultants: [facility] GI Nephrology  Procedures: None  CULTURES:  1/10 Blood >>>  1/11 sputum >>>  1/11 Urine >>> 1/11 respiratory virus panel  neg  Antibiotics: 1/10 Cefepime >>> 1/10 (1 dose in ED)  1/10 Vancomycin >>>  1/10 Zosyn >>>  1/10 Azithro >>>      Objective: Blood pressure 169/93, pulse 79, temperature 97.4 F (36.3 C), temperature source Oral, resp. rate 23, height $RemoveBe'5\' 11"'VCNMZedPR$  (1.803 m), weight 88.3 kg (194 lb 10.7 oz), SpO2 100.00%.  Intake/Output Summary (Last 24 hours) at 10/24/13 1252 Last data filed at 10/24/13 4132  Gross per 24 hour  Intake     50 ml  Output     20 ml  Net     30 ml   Exam: General: No acute respiratory distress Lungs: Clear to auscultation bilaterally without wheezes or crackles, 2L Cardiovascular: Regular rate and rhythm without  gallop or  rub - prominent 3/6 holosystolic murmur radiating into the carotids, no peripheral edema or JVD Abdomen: Nontender, nondistended, soft, bowel sounds  positive, no rebound, no ascites, no appreciable mass Musculoskeletal: No significant cyanosis, clubbing of bilateral lower extremities Neurological: Alert and oriented x 3, moves all extremities x 4 without focal neurological deficits, CN 2-12 intact  Scheduled Meds:  Scheduled Meds: . azithromycin  500 mg Intravenous Q24H  . calcium acetate  1,334 mg Oral TID WC  . cinacalcet  60 mg Oral Q breakfast  . citalopram  20 mg Oral Daily  . darbepoetin  60 mcg Intravenous Q Tue-HD  . doxercalciferol  10 mcg Intravenous Q T,Th,Sa-HD  . fentaNYL      . heparin      . heparin subcutaneous  5,000 Units Subcutaneous Q8H  . midazolam      . multivitamin  1 tablet Oral QHS  . pantoprazole  20 mg Oral Daily  . piperacillin-tazobactam (ZOSYN)  IV  2.25 g Intravenous Q8H  . simvastatin  10 mg Oral q1800  . vancomycin  1,000 mg Intravenous Q T,Th,Sa-HD    Data Reviewed: Basic Metabolic Panel:  Recent Labs Lab 10/21/13 1644 10/22/13 0504 10/22/13 1130 10/23/13 0055 10/24/13 0636  NA 143 140  --  135* 135*  K 3.0* 4.9  --  4.3 5.0  CL 105 99  --  95* 94*  CO2 22 24  --  24 22  GLUCOSE 87 127*  --  92 77  BUN 24* 40*  --  60* 72*  CREATININE 4.49* 6.73*  --  8.47* 10.22*  CALCIUM 6.8* 8.8  --  9.7 10.0  PHOS  --   --  5.2*  --  5.9*   Liver Function Tests:  Recent Labs Lab 10/21/13 1644 10/24/13 0636  AST 7  --   ALT <5  --   ALKPHOS 25*  --   BILITOT 0.4  --   PROT 5.4*  --   ALBUMIN 2.1* 2.6*   CBC:  Recent Labs Lab 10/21/13 1644 10/21/13 2230 10/22/13 0245 10/22/13 0700 10/23/13 0055 10/24/13 0636  WBC 5.0  --  16.5*  --  13.1* 13.4*  NEUTROABS 4.6  --   --   --   --   --   HGB 5.2* 11.6* 11.1* 10.8* 10.2* 11.0*  HCT 16.4* 36.0* 34.8* 33.4* 31.3* 33.2*  MCV 88.2  --  87.7  --  87.2 85.6  PLT 100*  --  183  --  179 203   Cardiac Enzymes:  Recent Labs Lab 10/21/13 2359 10/22/13 0503 10/22/13 1130 10/22/13 1805 10/23/13 0055  TROPONINI 2.43* 6.85* 7.38*  4.96* 3.90*   CBG:  Recent Labs Lab 10/22/13 1141 10/22/13 1709 10/23/13 0722 10/23/13 1114 10/23/13 1610  GLUCAP 82 105* 78 116* 93    Recent Results (from the past 240 hour(s))  CULTURE, BLOOD (ROUTINE X 2)     Status: None   Collection Time    10/21/13  4:50 PM      Result Value Range Status   Specimen Description BLOOD RIGHT FOREARM   Final   Special Requests BOTTLES DRAWN AEROBIC AND ANAEROBIC 5CC   Final   Culture  Setup Time     Final   Value: 10/21/2013 22:05     Performed at [facility]   Culture     Final   Value:        BLOOD CULTURE RECEIVED NO GROWTH TO DATE CULTURE WILL BE  HELD FOR 5 DAYS BEFORE ISSUING A FINAL NEGATIVE REPORT     Performed at [facility]   Report Status PENDING   Incomplete  CULTURE, BLOOD (ROUTINE X 2)     Status: None   Collection Time    10/21/13  5:05 PM      Result Value Range Status   Specimen Description BLOOD RIGHT WRIST   Final   Special Requests BOTTLES DRAWN AEROBIC AND ANAEROBIC 5CC   Final   Culture  Setup Time     Final   Value: 10/21/2013 22:04     Performed at [facility]   Culture     Final   Value:        BLOOD CULTURE RECEIVED NO GROWTH TO DATE CULTURE WILL BE HELD FOR 5 DAYS BEFORE ISSUING A FINAL NEGATIVE REPORT     Performed at [facility]   Report Status PENDING   Incomplete  MRSA PCR SCREENING     Status: None   Collection Time    10/21/13 10:14 PM      Result Value Range Status   MRSA by PCR NEGATIVE  NEGATIVE Final   Comment:            The GeneXpert MRSA Assay (FDA     approved for NASAL specimens     only), is one component of a     comprehensive MRSA colonization     surveillance program. It is not     intended to diagnose MRSA     infection nor to guide or     monitor treatment for     MRSA infections.  RESPIRATORY VIRUS PANEL     Status: None   Collection Time    10/21/13 11:04 PM      Result Value Range Status   Source - RVPAN NASAL SWAB   Corrected    Comment: CORRECTED ON 01/12 AT 0138: PREVIOUSLY REPORTED AS NASAL SWAB   Respiratory Syncytial Virus A NOT DETECTED   Final   Respiratory Syncytial Virus B NOT DETECTED   Final   Influenza A NOT DETECTED   Final   Influenza B NOT DETECTED   Final   Parainfluenza 1 NOT DETECTED   Final   Parainfluenza 2 NOT DETECTED   Final   Parainfluenza 3 NOT DETECTED   Final   Metapneumovirus NOT DETECTED   Final   Rhinovirus NOT DETECTED   Final   Adenovirus NOT DETECTED   Final   Influenza A H1 NOT DETECTED   Final   Influenza A H3 NOT DETECTED   Final   Comment: (NOTE)           Normal Reference Range for each Analyte: NOT DETECTED     Testing performed using the Luminex xTAG Respiratory Viral Panel test     kit.     This test was developed and its performance characteristics determined     by [facility]. It has not been cleared or approved by the [place]     Food and Drug Administration. This test is used for clinical purposes.     It should not be regarded as investigational or for research. This     laboratory is certified under the [place] (CLIA) as qualified to perform high complexity     clinical laboratory testing.     Performed at [facility], EXPECTORATED SPUTUM-ASSESSMENT     Status:  None   Collection Time    10/22/13  6:26 AM      Result Value Range Status   Specimen Description SPUTUM   Final   Special Requests Normal   Final   Sputum evaluation     Final   Value: THIS SPECIMEN IS ACCEPTABLE. RESPIRATORY CULTURE REPORT TO FOLLOW.   Report Status 10/22/2013 FINAL   Final  CULTURE, RESPIRATORY (NON-EXPECTORATED)     Status: None   Collection Time    10/22/13  6:26 AM      Result Value Range Status   Specimen Description SPUTUM   Final   Special Requests NONE   Final   Gram Stain     Final   Value: FEW WBC PRESENT, PREDOMINANTLY PMN     NO SQUAMOUS EPITHELIAL CELLS SEEN     FEW GRAM NEGATIVE RODS     RARE  GRAM POSITIVE COCCI     IN PAIRS RARE GRAM POSITIVE RODS   Culture     Final   Value: NORMAL OROPHARYNGEAL FLORA     Performed at [facility]   Report Status 10/24/2013 FINAL   Final     Studies:  Recent x-ray studies have been reviewed in detail by the Attending Physician  Time spent : 88mins  Allison Ellis, ANP [facility] Office  [phone] Pager [phone]  **If unable to reach the above provider after paging please contact the [place] @ [phone]  On-Call/Text Page:      [email or web address]      password [email or web address]  If 7PM-7AM, please contact night-coverage [email or web address] Password [email or web address] 10/24/2013, 12:52 PM   LOS: 3 days   I have personally examined this patient and reviewed the entire database. I have reviewed the above note, made any necessary editorial changes, and agree with its content.  Cherene Altes, MD [facility]

## 2013-10-24 NOTE — Progress Notes (Signed)
Patient blood pressure 187/94 at 1347.  No other complaints.  MD notified, and instructed to wait for patient dialysis later today.  Will continue to assess.

## 2013-10-24 NOTE — Progress Notes (Addendum)
Left AVGG - reclotted post PTA - full report pending. Have contacted IR for recommendations as to next step. K 5 this am. Dialysis cancelled.reschedule for am after declot/new access.   Amalia Hailey, PA-C  10/24/13  Addendum:  Dr. Vernard Gambles rec: surgical consult for possible revision - [facility] called. MBB

## 2013-10-24 NOTE — Progress Notes (Signed)
Patient returned to room; hemodialysis unable to continue due to absence of thrill and bruit. MD notified; orders received and hydralazine 10 mg given due to high blood pressure (189/102).  Nephrologist notified, and orders received.  Patient has no complaints.  Will continue to assess.

## 2013-10-25 ENCOUNTER — Inpatient Hospital Stay (HOSPITAL_COMMUNITY): Payer: Medicare Other | Admitting: Certified Registered"

## 2013-10-25 ENCOUNTER — Encounter (HOSPITAL_COMMUNITY): Payer: Medicare Other | Admitting: Certified Registered"

## 2013-10-25 ENCOUNTER — Inpatient Hospital Stay (HOSPITAL_COMMUNITY): Payer: Medicare Other

## 2013-10-25 ENCOUNTER — Encounter (HOSPITAL_COMMUNITY): Payer: Self-pay

## 2013-10-25 ENCOUNTER — Encounter (HOSPITAL_COMMUNITY)
Admission: EM | Disposition: A | Discharge status: Home / Self Care | Payer: Self-pay | Source: Home / Self Care | Attending: Internal Medicine

## 2013-10-25 ENCOUNTER — Encounter (HOSPITAL_COMMUNITY)
Admission: EM | Disposition: A | Discharge status: Home / Self Care | Payer: Medicare Other | Source: Home / Self Care | Attending: Internal Medicine

## 2013-10-25 DIAGNOSIS — Z01810 Encounter for preprocedural cardiovascular examination: Secondary | ICD-10-CM

## 2013-10-25 DIAGNOSIS — N186 End stage renal disease: Secondary | ICD-10-CM

## 2013-10-25 HISTORY — PX: INSERTION OF DIALYSIS CATHETER: SHX1324

## 2013-10-25 HISTORY — PX: TEE WITHOUT CARDIOVERSION: SHX5443

## 2013-10-25 LAB — CBC
HCT: 33 % — ABNORMAL LOW (ref 39.0–52.0)
HEMOGLOBIN: 10.9 g/dL — AB (ref 13.0–17.0)
MCH: 27.9 pg (ref 26.0–34.0)
MCHC: 33 g/dL (ref 30.0–36.0)
MCV: 84.6 fL (ref 78.0–100.0)
Platelets: 216 10*3/uL (ref 150–400)
RBC: 3.9 MIL/uL — ABNORMAL LOW (ref 4.22–5.81)
RDW: 18.5 % — ABNORMAL HIGH (ref 11.5–15.5)
WBC: 11.2 10*3/uL — AB (ref 4.0–10.5)

## 2013-10-25 LAB — BASIC METABOLIC PANEL
BUN: 80 mg/dL — ABNORMAL HIGH (ref 6–23)
CHLORIDE: 89 meq/L — AB (ref 96–112)
CO2: 22 meq/L (ref 19–32)
Calcium: 10.8 mg/dL — ABNORMAL HIGH (ref 8.4–10.5)
Creatinine, Ser: 11.76 mg/dL — ABNORMAL HIGH (ref 0.50–1.35)
GFR calc non Af Amer: 4 mL/min — ABNORMAL LOW (ref >90)
GFR, EST AFRICAN AMERICAN: 4 mL/min — AB (ref >90)
Glucose, Bld: 73 mg/dL (ref 70–99)
POTASSIUM: 4.9 meq/L (ref 3.7–5.3)
Sodium: 131 mEq/L — ABNORMAL LOW (ref 137–147)

## 2013-10-25 LAB — SURGICAL PCR SCREEN
MRSA, PCR: NEGATIVE
STAPHYLOCOCCUS AUREUS: NEGATIVE

## 2013-10-25 SURGERY — INSERTION OF DIALYSIS CATHETER
Anesthesia: Monitor Anesthesia Care

## 2013-10-25 SURGERY — ECHOCARDIOGRAM, TRANSESOPHAGEAL
Anesthesia: Moderate Sedation

## 2013-10-25 MED ORDER — FENTANYL CITRATE 0.05 MG/ML IJ SOLN
INTRAMUSCULAR | Status: DC | PRN
Start: 2013-10-25 — End: 2013-10-25
  Administered 2013-10-25: 25 ug via INTRAVENOUS

## 2013-10-25 MED ORDER — LIDOCAINE HCL (PF) 1 % IJ SOLN
INTRAMUSCULAR | Status: DC | PRN
  Administered 2013-10-25: 30 mL

## 2013-10-25 MED ORDER — SODIUM CHLORIDE 0.9 % IV SOLN
INTRAVENOUS | Status: DC
Start: 2013-10-25 — End: 2013-10-25
  Administered 2013-10-25: 09:00:00 500 mL via INTRAVENOUS

## 2013-10-25 MED ORDER — DOXERCALCIFEROL 4 MCG/2ML IV SOLN
INTRAVENOUS | Status: AC
  Filled 2013-10-25: qty 6

## 2013-10-25 MED ORDER — AZITHROMYCIN 500 MG PO TABS
500.0000 mg | ORAL_TABLET | Freq: Every day | ORAL | Status: DC
  Filled 2013-10-25 (×2): qty 1

## 2013-10-25 MED ORDER — BUTAMBEN-TETRACAINE-BENZOCAINE 2-2-14 % EX AERO
INHALATION_SPRAY | CUTANEOUS | Status: DC | PRN
  Administered 2013-10-25: 09:00:00 2 via TOPICAL

## 2013-10-25 MED ORDER — LIDOCAINE HCL (PF) 1 % IJ SOLN
INTRAMUSCULAR | Status: AC
  Filled 2013-10-25: qty 30

## 2013-10-25 MED ORDER — MIDAZOLAM HCL 10 MG/2ML IJ SOLN
INTRAMUSCULAR | Status: DC | PRN
  Administered 2013-10-25: 09:00:00 1 mg via INTRAVENOUS
  Administered 2013-10-25: 09:00:00 2 mg via INTRAVENOUS

## 2013-10-25 MED ORDER — IOHEXOL 300 MG/ML  SOLN
INTRAMUSCULAR | Status: DC | PRN
  Administered 2013-10-25: 10 mL via INTRAVENOUS

## 2013-10-25 MED ORDER — MIDAZOLAM HCL 5 MG/ML IJ SOLN
INTRAMUSCULAR | Status: AC
  Filled 2013-10-25: qty 2

## 2013-10-25 MED ORDER — SODIUM CHLORIDE 0.9 % IV SOLN
INTRAVENOUS | Status: DC
  Administered 2013-10-25: 16:00:00 via INTRAVENOUS

## 2013-10-25 MED ORDER — DIPHENHYDRAMINE HCL 50 MG/ML IJ SOLN
INTRAMUSCULAR | Status: AC
  Filled 2013-10-25: qty 1

## 2013-10-25 MED ORDER — HEPARIN SODIUM (PORCINE) 1000 UNIT/ML IJ SOLN
INTRAMUSCULAR | Status: DC | PRN
  Administered 2013-10-25: 7 mL via INTRAVENOUS

## 2013-10-25 MED ORDER — HYDROCODONE-ACETAMINOPHEN 5-325 MG PO TABS
ORAL_TABLET | ORAL | Status: AC
  Filled 2013-10-25: qty 2

## 2013-10-25 MED ORDER — HEPARIN SODIUM (PORCINE) 1000 UNIT/ML IJ SOLN
INTRAMUSCULAR | Status: AC
  Filled 2013-10-25: qty 1

## 2013-10-25 MED ORDER — FENTANYL CITRATE 0.05 MG/ML IJ SOLN
INTRAMUSCULAR | Status: DC | PRN
  Administered 2013-10-25 (×4): 25 ug via INTRAVENOUS

## 2013-10-25 MED ORDER — 0.9 % SODIUM CHLORIDE (POUR BTL) OPTIME
TOPICAL | Status: DC | PRN
  Administered 2013-10-25: 1000 mL

## 2013-10-25 MED ORDER — FENTANYL CITRATE 0.05 MG/ML IJ SOLN
INTRAMUSCULAR | Status: AC
  Filled 2013-10-25: qty 2

## 2013-10-25 MED ORDER — SODIUM CHLORIDE 0.9 % IV SOLN
INTRAVENOUS | Status: DC | PRN
  Administered 2013-10-25: 17:00:00 via INTRAVENOUS

## 2013-10-25 MED ORDER — SODIUM CHLORIDE 0.9 % IR SOLN
Status: DC | PRN
  Administered 2013-10-25: 17:00:00

## 2013-10-25 SURGICAL SUPPLY — 53 items
BAG DECANTER FOR FLEXI CONT (MISCELLANEOUS) ×2 IMPLANT
CATH CANNON HEMO 15F 50CM (CATHETERS) ×2 IMPLANT
CATH CANNON HEMO 15FR 19 (HEMODIALYSIS SUPPLIES) IMPLANT
CATH CANNON HEMO 15FR 23CM (HEMODIALYSIS SUPPLIES) ×2 IMPLANT
CATH CANNON HEMO 15FR 31CM (HEMODIALYSIS SUPPLIES) IMPLANT
CATH CANNON HEMO 15FR 32CM (HEMODIALYSIS SUPPLIES) IMPLANT
CATH STRAIGHT 5FR 65CM (CATHETERS) ×2 IMPLANT
CHLORAPREP W/TINT 26ML (MISCELLANEOUS) ×2 IMPLANT
COVER PROBE W GEL 5X96 (DRAPES) IMPLANT
COVER SURGICAL LIGHT HANDLE (MISCELLANEOUS) ×2 IMPLANT
DECANTER SPIKE VIAL GLASS SM (MISCELLANEOUS) ×2 IMPLANT
DERMABOND ADVANCED (GAUZE/BANDAGES/DRESSINGS) ×1
DERMABOND ADVANCED .7 DNX12 (GAUZE/BANDAGES/DRESSINGS) ×1 IMPLANT
DRAPE C-ARM 42X72 X-RAY (DRAPES) ×2 IMPLANT
DRAPE CHEST BREAST 15X10 FENES (DRAPES) ×2 IMPLANT
DRSG TEGADERM 4X4.75 (GAUZE/BANDAGES/DRESSINGS) ×2 IMPLANT
GAUZE SPONGE 2X2 8PLY STRL LF (GAUZE/BANDAGES/DRESSINGS) ×1 IMPLANT
GAUZE SPONGE 4X4 16PLY XRAY LF (GAUZE/BANDAGES/DRESSINGS) ×4 IMPLANT
GLOVE BIO SURGEON STRL SZ7 (GLOVE) ×2 IMPLANT
GLOVE BIO SURGEON STRL SZ7.5 (GLOVE) ×4 IMPLANT
GLOVE BIOGEL PI IND STRL 6.5 (GLOVE) ×1 IMPLANT
GLOVE BIOGEL PI IND STRL 7.0 (GLOVE) ×1 IMPLANT
GLOVE BIOGEL PI INDICATOR 6.5 (GLOVE) ×1
GLOVE BIOGEL PI INDICATOR 7.0 (GLOVE) ×1
GLOVE SURG SS PI 7.0 STRL IVOR (GLOVE) ×2 IMPLANT
GOWN STRL REUS W/ TWL LRG LVL3 (GOWN DISPOSABLE) ×5 IMPLANT
GOWN STRL REUS W/TWL LRG LVL3 (GOWN DISPOSABLE) ×5
GOWN STRL REUS W/TWL XL LVL3 (GOWN DISPOSABLE) ×2 IMPLANT
GUIDEWIRE ANGLED .035X150CM (WIRE) ×2 IMPLANT
KIT BASIN OR (CUSTOM PROCEDURE TRAY) ×2 IMPLANT
KIT ROOM TURNOVER OR (KITS) ×2 IMPLANT
NEEDLE 18GX1X1/2 (RX/OR ONLY) (NEEDLE) ×2 IMPLANT
NEEDLE 22X1 1/2 (OR ONLY) (NEEDLE) ×2 IMPLANT
NEEDLE HYPO 25GX1X1/2 BEV (NEEDLE) ×2 IMPLANT
NS IRRIG 1000ML POUR BTL (IV SOLUTION) ×2 IMPLANT
PACK SURGICAL SETUP 50X90 (CUSTOM PROCEDURE TRAY) ×2 IMPLANT
PAD ARMBOARD 7.5X6 YLW CONV (MISCELLANEOUS) ×4 IMPLANT
SET MICROPUNCTURE 5F STIFF (MISCELLANEOUS) IMPLANT
SPONGE GAUZE 2X2 STER 10/PKG (GAUZE/BANDAGES/DRESSINGS) ×1
SPONGE GAUZE 4X4 12PLY (GAUZE/BANDAGES/DRESSINGS) ×2 IMPLANT
SUT ETHILON 3 0 PS 1 (SUTURE) ×2 IMPLANT
SUT VICRYL 4-0 PS2 18IN ABS (SUTURE) ×2 IMPLANT
SYR 20CC LL (SYRINGE) ×4 IMPLANT
SYR 30ML LL (SYRINGE) IMPLANT
SYR 5ML LL (SYRINGE) ×4 IMPLANT
SYR CONTROL 10ML LL (SYRINGE) ×2 IMPLANT
SYRINGE 10CC LL (SYRINGE) ×2 IMPLANT
TOWEL OR 17X24 6PK STRL BLUE (TOWEL DISPOSABLE) ×2 IMPLANT
TOWEL OR 17X26 10 PK STRL BLUE (TOWEL DISPOSABLE) ×2 IMPLANT
WATER STERILE IRR 1000ML POUR (IV SOLUTION) ×2 IMPLANT
WIRE AMPLATZ SS-J .035X180CM (WIRE) IMPLANT
WIRE BENTSON .035X145CM (WIRE) ×2 IMPLANT
WIRE J 3MM .035X145CM (WIRE) ×2 IMPLANT

## 2013-10-25 NOTE — Interval H&P Note (Signed)
History and Physical Interval Note:  10/25/2013 3:46 PM  Brad Mcintosh  has presented today for surgery, with the diagnosis of ESRD  The various methods of treatment have been discussed with the patient and family. After consideration of risks, benefits and other options for treatment, the patient has consented to  Procedure(s): INSERTION OF DIALYSIS CATHETER (N/A) as a surgical intervention .  The patient's history has been reviewed, patient examined, no change in status, stable for surgery.  I have reviewed the patient's chart and labs.  Questions were answered to the patient's satisfaction.     FIELDS,CHARLES E

## 2013-10-25 NOTE — Anesthesia Postprocedure Evaluation (Signed)
  Anesthesia Post-op Note  Patient: Brad Mcintosh  Procedure(s) Performed: Procedure(s) with comments: INSERTION OF DIALYSIS CATHETER (N/A) - Attempted both right and left neck, procedure unsucessful.  Patient Location: PACU  Anesthesia Type:MAC  Level of Consciousness: awake, alert , oriented and patient cooperative  Airway and Oxygen Therapy: Patient Spontanous Breathing  Post-op Pain: mild  Post-op Assessment: Post-op Vital signs reviewed, Patient's Cardiovascular Status Stable, Respiratory Function Stable, Patent Airway, No signs of Nausea or vomiting and Pain level controlled  Post-op Vital Signs: stable  Complications: No apparent anesthesia complications

## 2013-10-25 NOTE — Progress Notes (Signed)
Echocardiogram Echocardiogram Transesophageal has been performed.  Brad Mcintosh 10/25/2013, 10:54 AM

## 2013-10-25 NOTE — Anesthesia Preprocedure Evaluation (Addendum)
Anesthesia Evaluation  Patient identified by MRN, date of birth, ID band Patient awake    Reviewed: Allergy & Precautions, H&P , NPO status , Patient's Chart, lab work & pertinent test results, reviewed documented beta blocker date and time   History of Anesthesia Complications Negative for: history of anesthetic complications  Airway Mallampati: II TM Distance: >3 FB Neck ROM: Full    Dental  (+) Edentulous Upper and Edentulous Lower   Pulmonary pneumonia -, former smoker,  Recent admit with diagnosis of left lower lobe pneumonia now on ABx. Patient respiratory function improved and felt to be at baseline   Pulmonary exam normal       Cardiovascular Exercise Tolerance: Poor METS: 3 - Mets hypertension, Pt. on medications and Pt. on home beta blockers - angina+ Peripheral Vascular Disease + dysrhythmias + Valvular Problems/Murmurs AS, AI and MR Rhythm:Regular Rate:Normal + Systolic murmurs and + Diastolic murmurs HTN with CHF, EF 50% with global hypokinesis, moderate MR, moderate AI/AS, Asymptomatic troponin elevation on admission felt to be demand ischemia, Cardiology following   Neuro/Psych negative neurological ROS  negative psych ROS   GI/Hepatic Neg liver ROS, Admit with anemia and tarry stools with evidence of GI bleed on EGD, No active bleeding with Hgb stable, Off ppi gtt and denies abdominal pain or indigestion.    Endo/Other  neg diabetesHypothyroidism   Renal/GU ESRF and DialysisRenal disease     Musculoskeletal   Abdominal   Peds  Hematology  (+) anemia , Hgb stable after 1 unit pRBCs for GI bleed.    Anesthesia Other Findings   Reproductive/Obstetrics                         Anesthesia Physical Anesthesia Plan  ASA: IV  Anesthesia Plan: MAC   Post-op Pain Management:    Induction: Intravenous  Airway Management Planned: Mask  Additional Equipment: None  Intra-op Plan:    Post-operative Plan: Extubation in OR  Informed Consent: I have reviewed the patients History and Physical, chart, labs and discussed the procedure including the risks, benefits and alternatives for the proposed anesthesia with the patient or authorized representative who has indicated his/her understanding and acceptance.   Dental advisory given  Plan Discussed with: Surgeon and CRNA  Anesthesia Plan Comments:         Anesthesia Quick Evaluation

## 2013-10-25 NOTE — Progress Notes (Signed)
   VASCULAR PROGRESS NOTE  SUBJECTIVE: No complaints   PHYSICAL EXAM: Filed Vitals:   10/24/13 1653 10/24/13 1825 10/24/13 2235 10/25/13 0514  BP: 187/90 157/85 177/87 166/96  Pulse: 88 86 80 79  Temp:   97.6 F (36.4 C) 97.6 F (36.4 C)  TempSrc: Oral  Oral Oral  Resp: 20  20 20  Height:      Weight:    197 lb 15.6 oz (89.8 kg)  SpO2: 100%  93% 97%   Weakly palpable right radial pulse Barely palpable left radial pulse  LABS: Lab Results  Component Value Date   WBC 11.2* 10/25/2013   HGB 10.9* 10/25/2013   HCT 33.0* 10/25/2013   MCV 84.6 10/25/2013   PLT 216 10/25/2013   Lab Results  Component Value Date   CREATININE 11.76* 10/25/2013   Lab Results  Component Value Date   INR 1.21 10/21/2013   CBG (last 3)   Recent Labs  10/23/13 0722 10/23/13 1114 10/23/13 1610  GLUCAP 78 116* 93    Active Problems:   CKD (chronic kidney disease) stage V requiring chronic dialysis   Sepsis   Melena   Anemia in CKD (chronic kidney disease)   Acute respiratory failure with hypoxia   LLL pneumonia   Demand ischemia   Systolic murmur   Hypothyroidism   ASSESSMENT AND PLAN:  * For placement of Diatek catheter today.  *  Clotted Left upper arm AVG: He underwent thrombolysis yesterday but the graft clotted later in the day. Looking at his shuntogram, and also by exam, the graft is significantly degenerative. In addition, he has a stent in the proximal graft. 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 I do not think that this graft can be salvaged. New access will be a challenge, and he will need further workup. 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 He has a previous right upper extremity arteriogram which suggest significant right upper extremity arterial occlusive disease. In addition, based on the results of his previous aortogram with runoff, he does not appear to be a candidate for a thigh graft. Based on the results of  his arterial study in vein map, which are ordered for today, he could potentially be a candidate for access in the right arm or potentially an entirely new left upper arm graft.   Chris Dickson Beeper: [phone] 10/25/2013    

## 2013-10-25 NOTE — CV Procedure (Signed)
INDICATIONS:   The patient is 79 year old male with possible thrombus in Left atrium.  PROCEDURE:  Informed consent was discussed including risks, benefits and alternatives for the procedure.  Risks include, but are not limited to, cough, sore throat, vomiting, nausea, somnolence, esophageal and stomach trauma or perforation, bleeding, low blood pressure, aspiration, pneumonia, infection, trauma to the teeth and death.    Patient was given sedation.  The oropharynx was anesthetized with topical lidocaine.  The transesophageal probe was inserted in the esophagus and stomach and multiple views were obtained.  Agitated saline was used after the transesophageal probe was removed from the body.  The patient was kept under observation until the patient left the procedure room.  The patient left the procedure room in stable condition.   COMPLICATIONS:  There were no immediate complications.  FINDINGS:  1. LEFT VENTRICLE: The left ventricle has mild concentric hypertrophy and mild generalized hypokinesia with EF of 50 %. No thrombus or masses seen in the left ventricle.  2. RIGHT VENTRICLE:  The right ventricle is mildly hypokinetic. No thrombus or masses.    3. LEFT ATRIUM:  The left atrium is dilated and without any thrombus or masses.  4. LEFT ATRIAL APPENDAGE:  The left atrial appendage is free of any thrombus or masses.  5. RIGHT ATRIUM:  The right atrium is free of any thrombus or masses.    6. ATRIAL SEPTUM:  The atrial septum is normal without any ASD or PFO by sonicated saline injection.  7. MITRAL VALVE:  The mitral valve has heavily calcified annulus and mild restriction in motion with multiple jets of moderate regurgitation. No masses or vegetations. Mild stenosis.  8. TRICUSPID VALVE:  The tricuspid valve is normal in structure and function with mild regurgitation. No masses, stenosis or vegetations.  9. AORTIC VALVE:  The aortic valve is heavily calcified with moderate stenosis, and  mild to moderate regurgitation. No masses, stenosis or vegetations.   10. PULMONIC VALVE:  The pulmonic valve is normal in structure and function with mild regurgitation. No masses, stenosis or vegetations.  11. AORTIC ARCH, ASCENDING AND DESCENDING AORTA:  The aorta had moderate atherosclerosis in the ascending or descending aorta.  The aortic arch was normal.  IMPRESSION:   1. Mild LV hypertrophy and mildly reduced LV systolic function. 2. Mild RV systolic dysfunction. 3. Mild to moderate Aortic stenosis and regurgitation of heavily calcified aortic valve. 4. Mild Mitral stenosis and moderate regurgitation. 5. No thrombus in dilated left atrium or in atrial appendage. 6. Moderate aortic atherosclerosis.  RECOMMENDATIONS:    Medical treatment.

## 2013-10-25 NOTE — H&P (View-Only) (Signed)
   VASCULAR PROGRESS NOTE  SUBJECTIVE: No complaints   PHYSICAL EXAM: Filed Vitals:   10/24/13 1653 10/24/13 1825 10/24/13 2235 10/25/13 0514  BP: 187/90 157/85 177/87 166/96  Pulse: 88 86 80 79  Temp:   97.6 F (36.4 C) 97.6 F (36.4 C)  TempSrc: Oral  Oral Oral  Resp: 20  20 20   Height:      Weight:    197 lb 15.6 oz (89.8 kg)  SpO2: 100%  93% 97%   Weakly palpable right radial pulse Barely palpable left radial pulse  LABS: Lab Results  Component Value Date   WBC 11.2* 10/25/2013   HGB 10.9* 10/25/2013   HCT 33.0* 10/25/2013   MCV 84.6 10/25/2013   PLT 216 10/25/2013   Lab Results  Component Value Date   CREATININE 11.76* 10/25/2013   Lab Results  Component Value Date   INR 1.21 10/21/2013   CBG (last 3)   Recent Labs  10/23/13 0722 10/23/13 1114 10/23/13 1610  GLUCAP 78 116* 93    Active Problems:   CKD (chronic kidney disease) stage V requiring chronic dialysis   Sepsis   Melena   Anemia in CKD (chronic kidney disease)   Acute respiratory failure with hypoxia   LLL pneumonia   Demand ischemia   Systolic murmur   Hypothyroidism   ASSESSMENT AND PLAN:  * For placement of Diatek catheter today.  *  Clotted Left upper arm AVG: He underwent thrombolysis yesterday but the graft clotted later in the day. Looking at his shuntogram, and also by exam, the graft is significantly degenerative. In addition, he has a stent in the proximal graft. 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 I do not think that this graft can be salvaged. New access will be a challenge, and he will need further workup. 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 He has a previous right upper extremity arteriogram which suggest significant right upper extremity arterial occlusive disease. In addition, based on the results of his previous aortogram with runoff, he does not appear to be a candidate for a thigh graft. Based on the results of  his arterial study in vein map, which are ordered for today, he could potentially be a candidate for access in the right arm or potentially an entirely new left upper arm graft.   Gae Gallop Beeper: [phone] 10/25/2013

## 2013-10-25 NOTE — Interval H&P Note (Signed)
History and Physical Interval Note:  10/25/2013 8:36 AM  Brad Mcintosh  has presented today for surgery, with the diagnosis of ESRD  The various methods of treatment have been discussed with the patient and family. After consideration of risks, benefits and other options for treatment, the patient has consented to  Procedure(s): INSERTION OF DIALYSIS CATHETER (N/A) as a surgical intervention .  The patient's history has been reviewed, patient examined, no change in status, stable for surgery.  I have reviewed the patient's chart and labs.  Questions were answered to the patient's satisfaction.     FIELDS,CHARLES E

## 2013-10-25 NOTE — Progress Notes (Signed)
  PRELIMINARY RESULTS   Right  Upper Extremity Vein Map    Cephalic  Segment Diameter Depth Comment  1. Axilla 3.23mm mm   2. Mid upper arm 4.3mm mm Thick wall  3. Above AC 2.8mm mm   4. In [facility] 4.23mm mm Thrombus   5. Below AC 3.23mm mm   6. Mid forearm 4.87mm mm Thick wall  7. Wrist 2.27mm mm Thick wall   mm mm    mm mm    mm mm    Basilic  Segment Diameter Depth Comment  1. Axilla 11.73mm 17.38mm   2. Mid upper arm 5.42mm 48mm Thick wall  3. Above [facility] 4.58mm 6.29mm   4. In [facility] 8.89mm mm Chronic thrombus  5. Below AC 5.57mm mm Chronic/ subacute thrombus  6. Mid forearm 36mm mm Thick wall  7. Wrist mm mm    mm mm    mm mm    mm mm     Landry Mellow, RDMS, RVT 10/25/2013

## 2013-10-25 NOTE — Progress Notes (Signed)
PATIENT DETAILS Name: Brad Mcintosh Age: 79 y.o. Sex: male Date of Birth: 1934-11-25 Admit Date: 10/21/2013 Admitting Physician Raynelle Jan, MD JFH:[id number],LSLH S, MD  Subjective: No major issues, awaiting for a hemodialysis catheter placement  Assessment/Plan: Sepsis  -resolved  - Likely secondary to pneumonia  -Blood culture no growth to date - Respiratory virus panel negative so far - Sputum culture negative  Acute respiratory failure with hypoxia - Secondary to pneumonia -Weaning oxygen slowly   Healthcare associated pneumonia - Clinically improving - No fever, cultures negative - Continue with Zosyn and Zithromax  Demand ischemia - Cardiology following - Left heart catheterization timing defer to cardiology - Currently without chest pain or shortness of breath  ? Suspected left atrial thrombus - Seen on transthoracic echocardiogram, however transesophageal echocardiogram negative  Melena  -GI followed  -Heme negative  -No recurrence and GI suspects initial hemoglobin spurious result  End-stage renal disease - On hemodialysis - Unfortunately left arm AVG unsalvageable, for hemodialysis catheter placement later today  Anemia - Secondary to chronic kidney disease - No indications for transfusion at present  Hypothyroidism - Continue levothyroxine  Disposition: Remain inpatient  DVT Prophylaxis: Prophylactic  Heparin  Code Status: Full code   Family Communication None at bedside  Procedures:  None  CONSULTS:  cardiology and nephrology  Time spent 40 minutes-which includes 50% of the time with face-to-face with patient/ family and coordinating care related to the above assessment and plan.    MEDICATIONS: Scheduled Meds: . azithromycin  500 mg Oral Daily  . calcium acetate  1,334 mg Oral TID WC  . cinacalcet  60 mg Oral Q breakfast  . citalopram  20 mg Oral Daily  . darbepoetin  60 mcg Intravenous Q Tue-HD  . doxercalciferol  10  mcg Intravenous Q T,Th,Sa-HD  . heparin subcutaneous  5,000 Units Subcutaneous Q8H  . metoprolol succinate  100 mg Oral Daily  . multivitamin  1 tablet Oral QHS  . pantoprazole  20 mg Oral Daily  . piperacillin-tazobactam (ZOSYN)  IV  2.25 g Intravenous Q8H  . simvastatin  10 mg Oral q1800   Continuous Infusions:  PRN Meds:.sodium chloride, acetaminophen, hydrALAZINE, HYDROcodone-acetaminophen, labetalol, ondansetron (ZOFRAN) IV  Antibiotics: Anti-infectives   Start     Dose/Rate Route Frequency Ordered Stop   10/25/13 1800  azithromycin (ZITHROMAX) tablet 500 mg     500 mg Oral Daily 10/25/13 1432     10/24/13 1800  oseltamivir (TAMIFLU) 6 MG/ML suspension 30 mg  Status:  Discontinued     30 mg Oral Every T-Th-Sa (1800) 10/22/13 1505 10/23/13 1114   10/24/13 1200  vancomycin (VANCOCIN) IVPB 1000 mg/200 mL premix  Status:  Discontinued     1,000 mg 200 mL/hr over 60 Minutes Intravenous Every T-Th-Sa (Hemodialysis) 10/21/13 1729 10/25/13 1425   10/24/13 1200  ceFEPIme (MAXIPIME) 2 g in dextrose 5 % 50 mL IVPB  Status:  Discontinued     2 g 100 mL/hr over 30 Minutes Intravenous Every T-Th-Sa (Hemodialysis) 10/21/13 1729 10/21/13 2155   10/23/13 2000  oseltamivir (TAMIFLU) 6 MG/ML suspension 30 mg  Status:  Discontinued     30 mg Oral Every M-W-F (2000) 10/21/13 2205 10/22/13 1505   10/21/13 2300  azithromycin (ZITHROMAX) 500 mg in dextrose 5 % 250 mL IVPB  Status:  Discontinued     500 mg 250 mL/hr over 60 Minutes Intravenous Every 24 hours 10/21/13 2148 10/25/13 1430   10/21/13 2300  piperacillin-tazobactam (ZOSYN) IVPB 2.25 g  2.25 g 100 mL/hr over 30 Minutes Intravenous 3 times per day 10/21/13 2158     10/21/13 2300  oseltamivir (TAMIFLU) 6 MG/ML suspension 30 mg     30 mg Oral  Once 10/21/13 2205 10/21/13 2301   10/21/13 2200  oseltamivir (TAMIFLU) capsule 75 mg  Status:  Discontinued     75 mg Oral Daily 10/21/13 2148 10/21/13 2159   10/21/13 1800  vancomycin (VANCOCIN)  2,000 mg in sodium chloride 0.9 % 500 mL IVPB     2,000 mg 250 mL/hr over 120 Minutes Intravenous NOW 10/21/13 1729 10/21/13 1953   10/21/13 1645  ceFEPIme (MAXIPIME) 2 g in dextrose 5 % 50 mL IVPB     2 g 100 mL/hr over 30 Minutes Intravenous  Once 10/21/13 1644 10/21/13 1745   10/21/13 1645  vancomycin (VANCOCIN) IVPB 1000 mg/200 mL premix  Status:  Discontinued     1,000 mg 200 mL/hr over 60 Minutes Intravenous  Once 10/21/13 1644 10/21/13 1729       PHYSICAL EXAM: Vital signs in last 24 hours: Filed Vitals:   10/25/13 1234 10/25/13 1300 10/25/13 1347 10/25/13 1430  BP: 164/87 165/84 160/79 169/74  Pulse:   87 64  Temp:   103 F (39.4 C) 97.6 F (36.4 C)  TempSrc:   Oral Oral  Resp:   22 20  Height:      Weight:      SpO2:   98% 100%    Weight change: 1.5 kg (3 lb 4.9 oz) Filed Weights   10/23/13 0354 10/24/13 0658 10/25/13 0514  Weight: 89.8 kg (197 lb 15.6 oz) 88.3 kg (194 lb 10.7 oz) 89.8 kg (197 lb 15.6 oz)   Body mass index is 27.62 kg/(m^2).   Gen Exam: Awake and alert with clear speech.   Neck: Supple, No JVD.   Chest: B/L Clear.   CVS: S1 S2 Regular, no murmurs.  Abdomen: soft, BS +, non tender, non distended.  Extremities: no edema, lower extremities warm to touch. Neurologic: Non Focal.   Skin: No Rash.   Wounds: N/A.   Intake/Output from previous day:  Intake/Output Summary (Last 24 hours) at 10/25/13 1436 Last data filed at 10/25/13 0805  Gross per 24 hour  Intake   1171 ml  Output      0 ml  Net   1171 ml     LAB RESULTS: CBC  Recent Labs Lab 10/21/13 1644  10/22/13 0245 10/22/13 0700 10/23/13 0055 10/24/13 0636 10/25/13 0515  WBC 5.0  --  16.5*  --  13.1* 13.4* 11.2*  HGB 5.2*  < > 11.1* 10.8* 10.2* 11.0* 10.9*  HCT 16.4*  < > 34.8* 33.4* 31.3* 33.2* 33.0*  PLT 100*  --  183  --  179 203 216  MCV 88.2  --  87.7  --  87.2 85.6 84.6  MCH 28.0  --  28.0  --  28.4 28.4 27.9  MCHC 31.7  --  31.9  --  32.6 33.1 33.0  RDW 18.9*  --   18.1*  --  18.4* 18.5* 18.5*  LYMPHSABS 0.3*  --   --   --   --   --   --   MONOABS 0.2  --   --   --   --   --   --   EOSABS 0.0  --   --   --   --   --   --   BASOSABS 0.0  --   --   --   --   --   --   < > =  values in this interval not displayed.  Chemistries   Recent Labs Lab 10/21/13 1644 10/22/13 0504 10/23/13 0055 10/24/13 0636 10/25/13 0515  NA 143 140 135* 135* 131*  K 3.0* 4.9 4.3 5.0 4.9  CL 105 99 95* 94* 89*  CO2 _0 GLUCOSE 87 127* 92 77 73  BUN 24* 40* 60* 72* 80*  CREATININE 4.49* 6.73* 8.47* 10.22* 11.76*  CALCIUM 6.8* 8.8 9.7 10.0 10.8*    CBG:  Recent Labs Lab 10/22/13 1141 10/22/13 1709 10/23/13 0722 10/23/13 1114 10/23/13 1610  GLUCAP 82 105* 78 116* 93    GFR Estimated Creatinine Clearance: 5.7 ml/min (by C-G formula based on Cr of 11.76).  Coagulation profile  Recent Labs Lab 10/21/13 2230  INR 1.21    Cardiac Enzymes  Recent Labs Lab 10/22/13 1130 10/22/13 1805 10/23/13 0055  TROPONINI 7.38* 4.96* 3.90*    No components found with this basename: POCBNP,  No results found for this basename: DDIMER,  in the last 72 hours No results found for this basename: HGBA1C,  in the last 72 hours No results found for this basename: CHOL, HDL, LDLCALC, TRIG, CHOLHDL, LDLDIRECT,  in the last 72 hours  Recent Labs  10/23/13 1135  TSH 1.730   No results found for this basename: VITAMINB12, FOLATE, FERRITIN, TIBC, IRON, RETICCTPCT,  in the last 72 hours No results found for this basename: LIPASE, AMYLASE,  in the last 72 hours  Urine Studies No results found for this basename: UACOL, UAPR, USPG, UPH, UTP, UGL, UKET, UBIL, UHGB, UNIT, UROB, ULEU, UEPI, UWBC, URBC, UBAC, CAST, CRYS, UCOM, BILUA,  in the last 72 hours  MICROBIOLOGY: Recent Results (from the past 240 hour(s))  CULTURE, BLOOD (ROUTINE X 2)     Status: None   Collection Time    10/21/13  4:50 PM      Result Value Range Status   Specimen Description BLOOD  RIGHT FOREARM   Final   Special Requests BOTTLES DRAWN AEROBIC AND ANAEROBIC 5CC   Final   Culture  Setup Time     Final   Value: 10/21/2013 22:05     Performed at [facility]   Culture     Final   Value:        BLOOD CULTURE RECEIVED NO GROWTH TO DATE CULTURE WILL BE HELD FOR 5 DAYS BEFORE ISSUING A FINAL NEGATIVE REPORT     Performed at [facility]   Report Status PENDING   Incomplete  CULTURE, BLOOD (ROUTINE X 2)     Status: None   Collection Time    10/21/13  5:05 PM      Result Value Range Status   Specimen Description BLOOD RIGHT WRIST   Final   Special Requests BOTTLES DRAWN AEROBIC AND ANAEROBIC 5CC   Final   Culture  Setup Time     Final   Value: 10/21/2013 22:04     Performed at [facility]   Culture     Final   Value:        BLOOD CULTURE RECEIVED NO GROWTH TO DATE CULTURE WILL BE HELD FOR 5 DAYS BEFORE ISSUING A FINAL NEGATIVE REPORT     Performed at [facility]   Report Status PENDING   Incomplete  MRSA PCR SCREENING     Status: None   Collection Time    10/21/13 10:14 PM      Result Value Range Status   MRSA by PCR  NEGATIVE  NEGATIVE Final   Comment:            The GeneXpert MRSA Assay (FDA     approved for NASAL specimens     only), is one component of a     comprehensive MRSA colonization     surveillance program. It is not     intended to diagnose MRSA     infection nor to guide or     monitor treatment for     MRSA infections.  RESPIRATORY VIRUS PANEL     Status: None   Collection Time    10/21/13 11:04 PM      Result Value Range Status   Source - RVPAN NASAL SWAB   Corrected   Comment: CORRECTED ON 01/12 AT 0138: PREVIOUSLY REPORTED AS NASAL SWAB   Respiratory Syncytial Virus A NOT DETECTED   Final   Respiratory Syncytial Virus B NOT DETECTED   Final   Influenza A NOT DETECTED   Final   Influenza B NOT DETECTED   Final   Parainfluenza 1 NOT DETECTED   Final   Parainfluenza 2 NOT DETECTED   Final   Parainfluenza 3  NOT DETECTED   Final   Metapneumovirus NOT DETECTED   Final   Rhinovirus NOT DETECTED   Final   Adenovirus NOT DETECTED   Final   Influenza A H1 NOT DETECTED   Final   Influenza A H3 NOT DETECTED   Final   Comment: (NOTE)           Normal Reference Range for each Analyte: NOT DETECTED     Testing performed using the Luminex xTAG Respiratory Viral Panel test     kit.     This test was developed and its performance characteristics determined     by [facility]. It has not been cleared or approved by the [place]     Food and Drug Administration. This test is used for clinical purposes.     It should not be regarded as investigational or for research. This     laboratory is certified under the [place] (CLIA) as qualified to perform high complexity     clinical laboratory testing.     Performed at [place], EXPECTORATED SPUTUM-ASSESSMENT     Status: None   Collection Time    10/22/13  6:26 AM      Result Value Range Status   Specimen Description SPUTUM   Final   Special Requests Normal   Final   Sputum evaluation     Final   Value: THIS SPECIMEN IS ACCEPTABLE. RESPIRATORY CULTURE REPORT TO FOLLOW.   Report Status 10/22/2013 FINAL   Final  CULTURE, RESPIRATORY (NON-EXPECTORATED)     Status: None   Collection Time    10/22/13  6:26 AM      Result Value Range Status   Specimen Description SPUTUM   Final   Special Requests NONE   Final   Gram Stain     Final   Value: FEW WBC PRESENT, PREDOMINANTLY PMN     NO SQUAMOUS EPITHELIAL CELLS SEEN     FEW GRAM NEGATIVE RODS     RARE GRAM POSITIVE COCCI     IN PAIRS RARE GRAM POSITIVE RODS   Culture     Final   Value: NORMAL OROPHARYNGEAL FLORA     Performed at [facility]   Report Status 10/24/2013 FINAL  Final    RADIOLOGY STUDIES/RESULTS: Ir Pta Venous Left  10/24/2013   CLINICAL DATA:  Occluded dialysis graft  TECHNIQUE: DIALYSIS GRAFT DECLOT  VENOUS  ANGIOPLASTY  ULTRASOUND GUIDANCE FOR VASCULAR ACCESS X2  The procedure, risks (including but not limited to bleeding, infection, organ damage ), benefits, and alternatives were explained to the patient. Questions regarding the procedure were encouraged and answered. The patient understands and consents to the procedure.  Intravenous Fentanyl and Versed were administered as conscious sedation during continuous cardiorespiratory monitoring by the radiology RN, with a total moderate sedation time of sixty minutes.  The left upper arm straight synthetic graft just central to the arterial anastomosis was accessed antegrade with a 21-gauge micropuncture needle under real-time ultrasonic guidance after the overlying skin prepped with Betadine, draped in usual sterile fashion, infiltrated locally with 1% lidocaine. Needle exchanged over 018 guidewire for transitional dilator through which 29m t-PA was administered. Ultrasound imaging documentation was saved. Through the dilator, a Bentson wire was advanced to the venous anastomosis. Over this a 26F sheath was placed, through which a 5 [place] catheter was advanced for outflow venography. This showed patency of the outflow venous system through the SVC. 3000 units heparin were administered. The Arrow PTD device was used to macerate thrombus in the graft.  In similar fashion, the more central aspect of the graft near the venous anastomosis was accessed retrograde under ultrasound with a micropuncture needle, exchanged for a transitional dilator. The venous limb dilator was exchanged in similar fashion for a 6 French vascular sheath. With a Kumpe catheter, a guidewire was placed across the arterial anastomosis. Over the wire, a Fogarty catheter was used to dislodge the platelet plug into the gRaft, where it was macerated. The Fogarty was used again to remove any residual platelet plug from the arterial anastomosis. Injection showed clearance of thrombus from the arterial  anastomosis with atheromatous irregularity of the native arterial circulation but no intraluminal filling defect. Balloon angioplasty of the graft including intra graft stent and the venous anastomosis was performed using a 7 mm x 4 cm Conquest angioplasty balloon with good response. Balloon was removed and injection showed patency of the anastomosis, without extravasation or other apparent complication. There is diffuse narrowing of the central aspect of the graft and some thrombus at the venous anastomosis. Because of difficulty passing the Treretola device across the venous anastomosis, I elected to use the Angiojet device over a guidewire. 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 There is still some residual stenosis of the venous anastomosis. this was further dilated with an 8 mm x 4 cm Conquest balloon with good response. Follow venography shows no significant residual or recurrent stenosis. No extravasation, dissection, or other apparent complication.  A follow shuntogram was performed, demonstrating good flow through the outflow vein, no extravasation. Reflux across the arterial anastomosis demonstrate this to be widely patent, with unremarkable appearance of the visualized native arterial circulation. The catheter and sheaths were then removed and hemostasis achieved with 2-0 Ethilon sutures. Patient tolerated procedure well.  COMPARISON:  None  FLUOROSCOPY TIME:  18 min 48 seconds  ACCESS: Premature reocclusion may necessitate surgical consultation and revision  IMPRESSION: 1. Technically successful declot of left upper arm straight synthetic hemodialysis graft. 2. Technically successful balloon angioplasty of venous anastomotic stenosis up to 8 mm.   Electronically Signed   By: DArne ClevelandM.D.   On: 10/24/2013 15:48   Ir Av Dialysis Graft  Declot  10/24/2013   CLINICAL DATA:  Occluded dialysis graft  TECHNIQUE: DIALYSIS GRAFT DECLOT  VENOUS  ANGIOPLASTY  ULTRASOUND GUIDANCE FOR VASCULAR ACCESS X2  The procedure, risks (including but not limited to bleeding, infection, organ damage ), benefits, and alternatives were explained to the patient. Questions regarding the procedure were encouraged and answered. The patient understands and consents to the procedure.  Intravenous Fentanyl and Versed were administered as conscious sedation during continuous cardiorespiratory monitoring by the radiology RN, with a total moderate sedation time of sixty minutes.  The left upper arm straight synthetic graft just central to the arterial anastomosis was accessed antegrade with a 21-gauge micropuncture needle under real-time ultrasonic guidance after the overlying skin prepped with Betadine, draped in usual sterile fashion, infiltrated locally with 1% lidocaine. Needle exchanged over 018 guidewire for transitional dilator through which 7m t-PA was administered. Ultrasound imaging documentation was saved. Through the dilator, a Bentson wire was advanced to the venous anastomosis. Over this a 66F sheath was placed, through which a 5 [place] catheter was advanced for outflow venography. This showed patency of the outflow venous system through the SVC. 3000 units heparin were administered. The Arrow PTD device was used to macerate thrombus in the graft.  In similar fashion, the more central aspect of the graft near the venous anastomosis was accessed retrograde under ultrasound with a micropuncture needle, exchanged for a transitional dilator. The venous limb dilator was exchanged in similar fashion for a 6 French vascular sheath. With a Kumpe catheter, a guidewire was placed across the arterial anastomosis. Over the wire, a Fogarty catheter was used to dislodge the platelet plug into the gRaft, where it was macerated. The Fogarty was used again to remove any residual platelet plug from the arterial anastomosis. Injection showed clearance of thrombus from the arterial  anastomosis with atheromatous irregularity of the native arterial circulation but no intraluminal filling defect. Balloon angioplasty of the graft including intra graft stent and the venous anastomosis was performed using a 7 mm x 4 cm Conquest angioplasty balloon with good response. Balloon was removed and injection showed patency of the anastomosis, without extravasation or other apparent complication. There is diffuse narrowing of the central aspect of the graft and some thrombus at the venous anastomosis. Because of difficulty passing the Treretola device across the venous anastomosis, I elected to use the Angiojet device over a guidewire. 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 There is still some residual stenosis of the venous anastomosis. this was further dilated with an 8 mm x 4 cm Conquest balloon with good response. Follow venography shows no significant residual or recurrent stenosis. No extravasation, dissection, or other apparent complication.  A follow shuntogram was performed, demonstrating good flow through the outflow vein, no extravasation. Reflux across the arterial anastomosis demonstrate this to be widely patent, with unremarkable appearance of the visualized native arterial circulation. The catheter and sheaths were then removed and hemostasis achieved with 2-0 Ethilon sutures. Patient tolerated procedure well.  COMPARISON:  None  FLUOROSCOPY TIME:  18 min 48 seconds  ACCESS: Premature reocclusion may necessitate surgical consultation and revision  IMPRESSION: 1. Technically successful declot of left upper arm straight synthetic hemodialysis graft. 2. Technically successful balloon angioplasty of venous anastomotic stenosis up to 8 mm.   Electronically Signed   By: DArne ClevelandM.D.   On: 10/24/2013 15:48   Ir Angio Av Shunt Addl Access  10/24/2013   CLINICAL DATA:  Occluded dialysis graft  TECHNIQUE: DIALYSIS GRAFT DECLOT   VENOUS ANGIOPLASTY  ULTRASOUND GUIDANCE FOR VASCULAR ACCESS X2  The procedure, risks (including but not limited to bleeding, infection, organ damage ), benefits, and alternatives were explained to the patient. Questions regarding the procedure were encouraged and answered. The patient understands and consents to the procedure.  Intravenous Fentanyl and Versed were administered as conscious sedation during continuous cardiorespiratory monitoring by the radiology RN, with a total moderate sedation time of sixty minutes.  The left upper arm straight synthetic graft just central to the arterial anastomosis was accessed antegrade with a 21-gauge micropuncture needle under real-time ultrasonic guidance after the overlying skin prepped with Betadine, draped in usual sterile fashion, infiltrated locally with 1% lidocaine. Needle exchanged over 018 guidewire for transitional dilator through which 34m t-PA was administered. Ultrasound imaging documentation was saved. Through the dilator, a Bentson wire was advanced to the venous anastomosis. Over this a 46F sheath was placed, through which a 5 [place] catheter was advanced for outflow venography. This showed patency of the outflow venous system through the SVC. 3000 units heparin were administered. The Arrow PTD device was used to macerate thrombus in the graft.  In similar fashion, the more central aspect of the graft near the venous anastomosis was accessed retrograde under ultrasound with a micropuncture needle, exchanged for a transitional dilator. The venous limb dilator was exchanged in similar fashion for a 6 French vascular sheath. With a Kumpe catheter, a guidewire was placed across the arterial anastomosis. Over the wire, a Fogarty catheter was used to dislodge the platelet plug into the gRaft, where it was macerated. The Fogarty was used again to remove any residual platelet plug from the arterial anastomosis. Injection showed clearance of thrombus from the  arterial anastomosis with atheromatous irregularity of the native arterial circulation but no intraluminal filling defect. Balloon angioplasty of the graft including intra graft stent and the venous anastomosis was performed using a 7 mm x 4 cm Conquest angioplasty balloon with good response. Balloon was removed and injection showed patency of the anastomosis, without extravasation or other apparent complication. There is diffuse narrowing of the central aspect of the graft and some thrombus at the venous anastomosis. Because of difficulty passing the Treretola device across the venous anastomosis, I elected to use the Angiojet device over a guidewire. 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 There is still some residual stenosis of the venous anastomosis. this was further dilated with an 8 mm x 4 cm Conquest balloon with good response. Follow venography shows no significant residual or recurrent stenosis. No extravasation, dissection, or other apparent complication.  A follow shuntogram was performed, demonstrating good flow through the outflow vein, no extravasation. Reflux across the arterial anastomosis demonstrate this to be widely patent, with unremarkable appearance of the visualized native arterial circulation. The catheter and sheaths were then removed and hemostasis achieved with 2-0 Ethilon sutures. Patient tolerated procedure well.  COMPARISON:  None  FLUOROSCOPY TIME:  18 min 48 seconds  ACCESS: Premature reocclusion may necessitate surgical consultation and revision  IMPRESSION: 1. Technically successful declot of left upper arm straight synthetic hemodialysis graft. 2. Technically successful balloon angioplasty of venous anastomotic stenosis up to 8 mm.   Electronically Signed   By: DArne ClevelandM.D.   On: 10/24/2013 15:48   Ir [place] Vasc Access Left  10/24/2013   CLINICAL DATA:  Occluded dialysis graft  TECHNIQUE: DIALYSIS GRAFT  DECLOT  VENOUS ANGIOPLASTY  ULTRASOUND GUIDANCE FOR VASCULAR ACCESS X2  The procedure, risks (including but not limited to bleeding, infection, organ damage ), benefits, and alternatives were explained to the patient. Questions regarding the procedure were encouraged and answered. The patient understands and consents to the procedure.  Intravenous Fentanyl and Versed were administered as conscious sedation during continuous cardiorespiratory monitoring by the radiology RN, with a total moderate sedation time of sixty minutes.  The left upper arm straight synthetic graft just central to the arterial anastomosis was accessed antegrade with a 21-gauge micropuncture needle under real-time ultrasonic guidance after the overlying skin prepped with Betadine, draped in usual sterile fashion, infiltrated locally with 1% lidocaine. Needle exchanged over 018 guidewire for transitional dilator through which 64m t-PA was administered. Ultrasound imaging documentation was saved. Through the dilator, a Bentson wire was advanced to the venous anastomosis. Over this a 45F sheath was placed, through which a 5 [place] catheter was advanced for outflow venography. This showed patency of the outflow venous system through the SVC. 3000 units heparin were administered. The Arrow PTD device was used to macerate thrombus in the graft.  In similar fashion, the more central aspect of the graft near the venous anastomosis was accessed retrograde under ultrasound with a micropuncture needle, exchanged for a transitional dilator. The venous limb dilator was exchanged in similar fashion for a 6 French vascular sheath. With a Kumpe catheter, a guidewire was placed across the arterial anastomosis. Over the wire, a Fogarty catheter was used to dislodge the platelet plug into the gRaft, where it was macerated. The Fogarty was used again to remove any residual platelet plug from the arterial anastomosis. Injection showed clearance of thrombus from  the arterial anastomosis with atheromatous irregularity of the native arterial circulation but no intraluminal filling defect. Balloon angioplasty of the graft including intra graft stent and the venous anastomosis was performed using a 7 mm x 4 cm Conquest angioplasty balloon with good response. Balloon was removed and injection showed patency of the anastomosis, without extravasation or other apparent complication. There is diffuse narrowing of the central aspect of the graft and some thrombus at the venous anastomosis. Because of difficulty passing the Treretola device across the venous anastomosis, I elected to use the Angiojet device over a guidewire. 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 There is still some residual stenosis of the venous anastomosis. this was further dilated with an 8 mm x 4 cm Conquest balloon with good response. Follow venography shows no significant residual or recurrent stenosis. No extravasation, dissection, or other apparent complication.  A follow shuntogram was performed, demonstrating good flow through the outflow vein, no extravasation. Reflux across the arterial anastomosis demonstrate this to be widely patent, with unremarkable appearance of the visualized native arterial circulation. The catheter and sheaths were then removed and hemostasis achieved with 2-0 Ethilon sutures. Patient tolerated procedure well.  COMPARISON:  None  FLUOROSCOPY TIME:  18 min 48 seconds  ACCESS: Premature reocclusion may necessitate surgical consultation and revision  IMPRESSION: 1. Technically successful declot of left upper arm straight synthetic hemodialysis graft. 2. Technically successful balloon angioplasty of venous anastomotic stenosis up to 8 mm.   Electronically Signed   By: DArne ClevelandM.D.   On: 10/24/2013 15:48   Dg Chest Port 1 View  10/22/2013   CLINICAL DATA:  Fever and sepsis.  EXAM: PORTABLE CHEST - 1 VIEW   COMPARISON:  10/21/2013 and prior  chest radiographs  FINDINGS: Cardiomegaly again noted.  Mid-lower lung airspace opacities slightly improved.  No new airspace disease, pulmonary edema or pneumothorax noted.  There is no evidence pleural effusion.  IMPRESSION: Slightly improved left lung airspace disease/pneumonia.   Electronically Signed   By: Hassan Rowan M.D.   On: 10/22/2013 08:11   Dg Chest Portable 1 View  10/21/2013   CLINICAL DATA:  Status post central line placement  EXAM: PORTABLE CHEST - 1 VIEW  COMPARISON:  10/21/2013  FINDINGS: Cardiac shadow is stable. Aortic calcifications are again seen. Left basilar infiltrate is again noted. The right lung remains clear. There is been placement of a right-sided central venous line although it is coiled within the neck. Is difficult to assess whether it lies completely within the vein or within the adjacent soft tissues. Clinical correlation as to blood withdrawal is recommended.  IMPRESSION: Status post central line placement coiled within the right neck. Correlation with the ability to aspirate blood is recommended.   Electronically Signed   By: Inez Catalina M.D.   On: 10/21/2013 20:28   Dg Chest Port 1 View  10/21/2013   CLINICAL DATA:  Loss of consciousness  EXAM: PORTABLE CHEST - 1 VIEW  COMPARISON:  11/16/2012  FINDINGS: Cardiac shadow is mildly enlarged. The lungs are well aerated with diffuse increased density in the left mid and lower lung consistent with acute pneumonia. No bony abnormality is noted.  IMPRESSION: Left basilar pneumonia.   Electronically Signed   By: Inez Catalina M.D.   On: 10/21/2013 17:08    Oren Binet, MD  [facility] Pager:[phone]  If 7PM-7AM, please contact night-coverage [email or web address] Password [email or web address] 10/25/2013, 2:36 PM   LOS: 4 days

## 2013-10-25 NOTE — H&P (View-Only) (Signed)
Brad Mcintosh is an 79 y.o. male.   Chief Complaint: Abnormal cardiac enzymes. HPI: 79 year old male with ESRD, hypertension, valvular heart disease. Gout, anemia peripheral vascular disease had syncopal episode and possible pneumonia treated with IV fluids and antibiotics. His EKG showed anterolateral ischemia and he has elevated cardiac enzymes suggestive of NSTEMI. He has mild LV systolic dysfunction with mild AS and MS with mild AI, MR, TR. There is also some suggestion of clot in LV. No chest pain.  Past Medical History  Diagnosis Date  . Hypertension   . Hyperlipidemia   . Cancer     prostate  . Gout   . Anemia     Iron deficiency  . Thyroid disease   . Malnutrition   . Atrial fibrillation     paroxysmal  . Chronic kidney disease     Pt has HD on TUESDAY, THURSDAY and SATURDAY  at [facility]  . Peripheral vascular disease       Past Surgical History  Procedure Laterality Date  . Av fistula placement  06/2006    left forearm  . Amputation      right finger  . Incise and drain abcess      chronic complex infection right long finger  . Insertion of dialysis catheter  05/30/2010    right internal jugular Palindrome catheter  . Aortogram  06/01/11    w/ runoff    Family History  Problem Relation Age of Onset  . Hypertension Mother   . Asthma Father   . Cancer Sister     breast  . Kidney disease Brother   . Diabetes Brother    Social History:  reports that he quit smoking about 41 years ago. His smoking use included Cigarettes. He smoked 0.50 packs per day. He has never used smokeless tobacco. He reports that he does not drink alcohol or use illicit drugs.  Allergies: No Known Allergies  Medications Prior to Admission  Medication Sig Dispense Refill  . calcium acetate (PHOSLO) 667 MG capsule Take 667 mg by mouth 3 (three) times daily with meals.       . cinacalcet (SENSIPAR) 60 MG tablet Take 60 mg by mouth daily. Takes 1 tablet with evening meal      . citalopram  (CELEXA) 20 MG tablet Take 20 mg by mouth at bedtime. Depression      . cyclobenzaprine (FLEXERIL) 10 MG tablet Take one at bed time for back pain and spasms  30 tablet  0  . dexlansoprazole (DEXILANT) 60 MG capsule Take 60 mg by mouth daily.      Marland Kitchen gabapentin (NEURONTIN) 100 MG capsule Take 100 mg by mouth 2 (two) times daily.       Marland Kitchen HYDROcodone-acetaminophen (NORCO) 5-325 MG per tablet Take 1 tablet by mouth at bedtime as needed. pain      . metoprolol (TOPROL-XL) 100 MG 24 hr tablet Take 100 mg by mouth daily.        . simvastatin (ZOCOR) 10 MG tablet Take 10 mg by mouth at bedtime.      . traMADol (ULTRAM) 50 MG tablet Take 50 mg by mouth every 6 (six) hours as needed for moderate pain.       Marland Kitchen acetaminophen (TYLENOL) 160 MG/5ML suspension Take 1,000 mg by mouth every 6 (six) hours as needed.      . ondansetron (ZOFRAN-ODT) 4 MG disintegrating tablet Take 4 mg by mouth every 8 (eight) hours as needed for nausea  or vomiting.        Results for orders placed during the hospital encounter of 10/21/13 (from the past 48 hour(s))  INFLUENZA PANEL BY PCR (TYPE A & B, H1N1)     Status: None   Collection Time    10/22/13 11:08 PM      Result Value Range   Influenza A By PCR NEGATIVE  NEGATIVE   Influenza B By PCR NEGATIVE  NEGATIVE   H1N1 flu by pcr NOT DETECTED  NOT DETECTED   Comment:            The Xpert Flu assay (FDA approved for     nasal aspirates or washes and     nasopharyngeal swab specimens), is     intended as an aid in the diagnosis of     influenza and should not be used as     a sole basis for treatment.  TROPONIN I     Status: Abnormal   Collection Time    10/23/13 12:55 AM      Result Value Range   Troponin I 3.90 (*) <0.30 ng/mL   Comment:            Due to the release kinetics of cTnI,     a negative result within the first hours     of the onset of symptoms does not rule out     myocardial infarction with certainty.     If myocardial infarction is still suspected,      repeat the test at appropriate intervals.     CRITICAL VALUE NOTED.  VALUE IS CONSISTENT WITH PREVIOUSLY REPORTED AND CALLED VALUE.     Performed at [facility]  CBC     Status: Abnormal   Collection Time    10/23/13 12:55 AM      Result Value Range   WBC 13.1 (*) 4.0 - 10.5 K/uL   RBC 3.59 (*) 4.22 - 5.81 MIL/uL   Hemoglobin 10.2 (*) 13.0 - 17.0 g/dL   HCT 31.3 (*) 39.0 - 52.0 %   MCV 87.2  78.0 - 100.0 fL   MCH 28.4  26.0 - 34.0 pg   MCHC 32.6  30.0 - 36.0 g/dL   RDW 18.4 (*) 11.5 - 15.5 %   Platelets 179  150 - 400 K/uL  BASIC METABOLIC PANEL     Status: Abnormal   Collection Time    10/23/13 12:55 AM      Result Value Range   Sodium 135 (*) 137 - 147 mEq/L   Potassium 4.3  3.7 - 5.3 mEq/L   Chloride 95 (*) 96 - 112 mEq/L   CO2 24  19 - 32 mEq/L   Glucose, Bld 92  70 - 99 mg/dL   BUN 60 (*) 6 - 23 mg/dL   Creatinine, Ser 8.47 (*) 0.50 - 1.35 mg/dL   Calcium 9.7  8.4 - 10.5 mg/dL   GFR calc non Af Amer 5 (*) >90 mL/min   GFR calc Af Amer 6 (*) >90 mL/min   Comment: (NOTE)     The eGFR has been calculated using the CKD EPI equation.     This calculation has not been validated in all clinical situations.     eGFR's persistently <90 mL/min signify possible Chronic Kidney     Disease.  PROCALCITONIN     Status: None   Collection Time    10/23/13 12:55 AM      Result Value Range  Procalcitonin >175.00     Comment:            Interpretation:     PCT >= 10 ng/mL:     Important systemic inflammatory response,     almost exclusively due to severe bacterial     sepsis or septic shock.     (NOTE)             ICU PCT Algorithm               Non ICU PCT Algorithm        ----------------------------     ------------------------------             PCT < 0.25 ng/mL                 PCT < 0.1 ng/mL         Stopping of antibiotics            Stopping of antibiotics           strongly encouraged.               strongly encouraged.         ----------------------------     ------------------------------           PCT level decrease by               PCT < 0.25 ng/mL           >= 80% from peak PCT           OR PCT 0.25 - 0.5 ng/mL          Stopping of antibiotics                                                 encouraged.         Stopping of antibiotics               encouraged.        ----------------------------     ------------------------------           PCT level decrease by              PCT >= 0.25 ng/mL           < 80% from peak PCT            AND PCT >= 0.5 ng/mL            Continuing antibiotics                                                  encouraged.           Continuing antibiotics                encouraged.        ----------------------------     ------------------------------         PCT level increase compared          PCT > 0.5 ng/mL             with peak PCT AND              PCT >= 0.5 ng/mL  Escalation of antibiotics                                              strongly encouraged.          Escalation of antibiotics            strongly encouraged.  LACTIC ACID, PLASMA     Status: None   Collection Time    10/23/13  3:19 AM      Result Value Range   Lactic Acid, Venous 1.2  0.5 - 2.2 mmol/L  GLUCOSE, CAPILLARY     Status: None   Collection Time    10/23/13  7:22 AM      Result Value Range   Glucose-Capillary 78  70 - 99 mg/dL  GLUCOSE, CAPILLARY     Status: Abnormal   Collection Time    10/23/13 11:14 AM      Result Value Range   Glucose-Capillary 116 (*) 70 - 99 mg/dL  TSH     Status: None   Collection Time    10/23/13 11:35 AM      Result Value Range   TSH 1.730  0.350 - 4.500 uIU/mL   Comment: Performed at [place], CAPILLARY     Status: None   Collection Time    10/23/13  4:10 PM      Result Value Range   Glucose-Capillary 93  70 - 99 mg/dL   Comment 1 Notify RN    RENAL FUNCTION PANEL     Status: Abnormal   Collection Time    10/24/13  6:36 AM       Result Value Range   Sodium 135 (*) 137 - 147 mEq/L   Potassium 5.0  3.7 - 5.3 mEq/L   Chloride 94 (*) 96 - 112 mEq/L   CO2 22  19 - 32 mEq/L   Glucose, Bld 77  70 - 99 mg/dL   BUN 72 (*) 6 - 23 mg/dL   Creatinine, Ser 10.22 (*) 0.50 - 1.35 mg/dL   Calcium 10.0  8.4 - 10.5 mg/dL   Phosphorus 5.9 (*) 2.3 - 4.6 mg/dL   Albumin 2.6 (*) 3.5 - 5.2 g/dL   GFR calc non Af Amer 4 (*) >90 mL/min   GFR calc Af Amer 5 (*) >90 mL/min   Comment: (NOTE)     The eGFR has been calculated using the CKD EPI equation.     This calculation has not been validated in all clinical situations.     eGFR's persistently <90 mL/min signify possible Chronic Kidney     Disease.  CBC     Status: Abnormal   Collection Time    10/24/13  6:36 AM      Result Value Range   WBC 13.4 (*) 4.0 - 10.5 K/uL   RBC 3.88 (*) 4.22 - 5.81 MIL/uL   Hemoglobin 11.0 (*) 13.0 - 17.0 g/dL   HCT 33.2 (*) 39.0 - 52.0 %   MCV 85.6  78.0 - 100.0 fL   MCH 28.4  26.0 - 34.0 pg   MCHC 33.1  30.0 - 36.0 g/dL   RDW 18.5 (*) 11.5 - 15.5 %   Platelets 203  150 - 400 K/uL   Ir Pta Venous Left  10/24/2013   CLINICAL DATA:  Occluded dialysis graft  TECHNIQUE: DIALYSIS GRAFT DECLOT  VENOUS ANGIOPLASTY  ULTRASOUND GUIDANCE FOR VASCULAR ACCESS X2  The procedure, risks (including but not limited to bleeding, infection, organ damage ), benefits, and alternatives were explained to the patient. Questions regarding the procedure were encouraged and answered. The patient understands and consents to the procedure.  Intravenous Fentanyl and Versed were administered as conscious sedation during continuous cardiorespiratory monitoring by the radiology RN, with a total moderate sedation time of sixty minutes.  The left upper arm straight synthetic graft just central to the arterial anastomosis was accessed antegrade with a 21-gauge micropuncture needle under real-time ultrasonic guidance after the overlying skin prepped with Betadine, draped in usual sterile  fashion, infiltrated locally with 1% lidocaine. Needle exchanged over 018 guidewire for transitional dilator through which $RemoveBefor'4mg'voagITrhEMZc$  t-PA was administered. Ultrasound imaging documentation was saved. Through the dilator, a Bentson wire was advanced to the venous anastomosis. Over this a 29F sheath was placed, through which a 5 [place] Kumpe catheter was advanced for outflow venography. This showed patency of the outflow venous system through the SVC. 3000 units heparin were administered. The Arrow PTD device was used to macerate thrombus in the graft.  In similar fashion, the more central aspect of the graft near the venous anastomosis was accessed retrograde under ultrasound with a micropuncture needle, exchanged for a transitional dilator. The venous limb dilator was exchanged in similar fashion for a 6 French vascular sheath. With a Kumpe catheter, a guidewire was placed across the arterial anastomosis. Over the wire, a Fogarty catheter was used to dislodge the platelet plug into the gRaft, where it was macerated. The Fogarty was used again to remove any residual platelet plug from the arterial anastomosis. Injection showed clearance of thrombus from the arterial anastomosis with atheromatous irregularity of the native arterial circulation but no intraluminal filling defect. Balloon angioplasty of the graft including intra graft stent and the venous anastomosis was performed using a 7 mm x 4 cm Conquest angioplasty balloon with good response. Balloon was removed and injection showed patency of the anastomosis, without extravasation or other apparent complication. There is diffuse narrowing of the central aspect of the graft and some thrombus at the venous anastomosis. Because of difficulty passing the Treretola device across the venous anastomosis, I elected to use the Angiojet device over a guidewire. 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 There is still some  residual stenosis of the venous anastomosis. this was further dilated with an 8 mm x 4 cm Conquest balloon with good response. Follow venography shows no significant residual or recurrent stenosis. No extravasation, dissection, or other apparent complication.  A follow shuntogram was performed, demonstrating good flow through the outflow vein, no extravasation. Reflux across the arterial anastomosis demonstrate this to be widely patent, with unremarkable appearance of the visualized native arterial circulation. The catheter and sheaths were then removed and hemostasis achieved with 2-0 Ethilon sutures. Patient tolerated procedure well.  COMPARISON:  None  FLUOROSCOPY TIME:  18 min 48 seconds  ACCESS: Premature reocclusion may necessitate surgical consultation and revision  IMPRESSION: 1. Technically successful declot of left upper arm straight synthetic hemodialysis graft. 2. Technically successful balloon angioplasty of venous anastomotic stenosis up to 8 mm.   Electronically Signed   By: Arne Cleveland M.D.   On: 10/24/2013 15:48   Ir Av Dialysis Graft Declot  10/24/2013   CLINICAL DATA:  Occluded dialysis graft  TECHNIQUE: DIALYSIS GRAFT DECLOT  VENOUS ANGIOPLASTY  ULTRASOUND GUIDANCE FOR VASCULAR ACCESS X2  The procedure,  risks (including but not limited to bleeding, infection, organ damage ), benefits, and alternatives were explained to the patient. Questions regarding the procedure were encouraged and answered. The patient understands and consents to the procedure.  Intravenous Fentanyl and Versed were administered as conscious sedation during continuous cardiorespiratory monitoring by the radiology RN, with a total moderate sedation time of sixty minutes.  The left upper arm straight synthetic graft just central to the arterial anastomosis was accessed antegrade with a 21-gauge micropuncture needle under real-time ultrasonic guidance after the overlying skin prepped with Betadine, draped in usual sterile  fashion, infiltrated locally with 1% lidocaine. Needle exchanged over 018 guidewire for transitional dilator through which $RemoveBefor'4mg'axcYStmgYEWs$  t-PA was administered. Ultrasound imaging documentation was saved. Through the dilator, a Bentson wire was advanced to the venous anastomosis. Over this a 48F sheath was placed, through which a 5 [place] Kumpe catheter was advanced for outflow venography. This showed patency of the outflow venous system through the SVC. 3000 units heparin were administered. The Arrow PTD device was used to macerate thrombus in the graft.  In similar fashion, the more central aspect of the graft near the venous anastomosis was accessed retrograde under ultrasound with a micropuncture needle, exchanged for a transitional dilator. The venous limb dilator was exchanged in similar fashion for a 6 French vascular sheath. With a Kumpe catheter, a guidewire was placed across the arterial anastomosis. Over the wire, a Fogarty catheter was used to dislodge the platelet plug into the gRaft, where it was macerated. The Fogarty was used again to remove any residual platelet plug from the arterial anastomosis. Injection showed clearance of thrombus from the arterial anastomosis with atheromatous irregularity of the native arterial circulation but no intraluminal filling defect. Balloon angioplasty of the graft including intra graft stent and the venous anastomosis was performed using a 7 mm x 4 cm Conquest angioplasty balloon with good response. Balloon was removed and injection showed patency of the anastomosis, without extravasation or other apparent complication. There is diffuse narrowing of the central aspect of the graft and some thrombus at the venous anastomosis. Because of difficulty passing the Treretola device across the venous anastomosis, I elected to use the Angiojet device over a guidewire. 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 There is still some  residual stenosis of the venous anastomosis. this was further dilated with an 8 mm x 4 cm Conquest balloon with good response. Follow venography shows no significant residual or recurrent stenosis. No extravasation, dissection, or other apparent complication.  A follow shuntogram was performed, demonstrating good flow through the outflow vein, no extravasation. Reflux across the arterial anastomosis demonstrate this to be widely patent, with unremarkable appearance of the visualized native arterial circulation. The catheter and sheaths were then removed and hemostasis achieved with 2-0 Ethilon sutures. Patient tolerated procedure well.  COMPARISON:  None  FLUOROSCOPY TIME:  18 min 48 seconds  ACCESS: Premature reocclusion may necessitate surgical consultation and revision  IMPRESSION: 1. Technically successful declot of left upper arm straight synthetic hemodialysis graft. 2. Technically successful balloon angioplasty of venous anastomotic stenosis up to 8 mm.   Electronically Signed   By: Arne Cleveland M.D.   On: 10/24/2013 15:48   Ir Angio Av Shunt Addl Access  10/24/2013   CLINICAL DATA:  Occluded dialysis graft  TECHNIQUE: DIALYSIS GRAFT DECLOT  VENOUS ANGIOPLASTY  ULTRASOUND GUIDANCE FOR VASCULAR ACCESS X2  The procedure, risks (including but not limited to bleeding, infection,  organ damage ), benefits, and alternatives were explained to the patient. Questions regarding the procedure were encouraged and answered. The patient understands and consents to the procedure.  Intravenous Fentanyl and Versed were administered as conscious sedation during continuous cardiorespiratory monitoring by the radiology RN, with a total moderate sedation time of sixty minutes.  The left upper arm straight synthetic graft just central to the arterial anastomosis was accessed antegrade with a 21-gauge micropuncture needle under real-time ultrasonic guidance after the overlying skin prepped with Betadine, draped in usual  sterile fashion, infiltrated locally with 1% lidocaine. Needle exchanged over 018 guidewire for transitional dilator through which $RemoveBefor'4mg'LmrMpmFuRmMv$  t-PA was administered. Ultrasound imaging documentation was saved. Through the dilator, a Bentson wire was advanced to the venous anastomosis. Over this a 8F sheath was placed, through which a 5 [place] Kumpe catheter was advanced for outflow venography. This showed patency of the outflow venous system through the SVC. 3000 units heparin were administered. The Arrow PTD device was used to macerate thrombus in the graft.  In similar fashion, the more central aspect of the graft near the venous anastomosis was accessed retrograde under ultrasound with a micropuncture needle, exchanged for a transitional dilator. The venous limb dilator was exchanged in similar fashion for a 6 French vascular sheath. With a Kumpe catheter, a guidewire was placed across the arterial anastomosis. Over the wire, a Fogarty catheter was used to dislodge the platelet plug into the gRaft, where it was macerated. The Fogarty was used again to remove any residual platelet plug from the arterial anastomosis. Injection showed clearance of thrombus from the arterial anastomosis with atheromatous irregularity of the native arterial circulation but no intraluminal filling defect. Balloon angioplasty of the graft including intra graft stent and the venous anastomosis was performed using a 7 mm x 4 cm Conquest angioplasty balloon with good response. Balloon was removed and injection showed patency of the anastomosis, without extravasation or other apparent complication. There is diffuse narrowing of the central aspect of the graft and some thrombus at the venous anastomosis. Because of difficulty passing the Treretola device across the venous anastomosis, I elected to use the Angiojet device over a guidewire. 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 There is  still some residual stenosis of the venous anastomosis. this was further dilated with an 8 mm x 4 cm Conquest balloon with good response. Follow venography shows no significant residual or recurrent stenosis. No extravasation, dissection, or other apparent complication.  A follow shuntogram was performed, demonstrating good flow through the outflow vein, no extravasation. Reflux across the arterial anastomosis demonstrate this to be widely patent, with unremarkable appearance of the visualized native arterial circulation. The catheter and sheaths were then removed and hemostasis achieved with 2-0 Ethilon sutures. Patient tolerated procedure well.  COMPARISON:  None  FLUOROSCOPY TIME:  18 min 48 seconds  ACCESS: Premature reocclusion may necessitate surgical consultation and revision  IMPRESSION: 1. Technically successful declot of left upper arm straight synthetic hemodialysis graft. 2. Technically successful balloon angioplasty of venous anastomotic stenosis up to 8 mm.   Electronically Signed   By: Arne Cleveland M.D.   On: 10/24/2013 15:48   Ir US Guide Vasc Access Left  10/24/2013   CLINICAL DATA:  Occluded dialysis graft  TECHNIQUE: DIALYSIS GRAFT DECLOT  VENOUS ANGIOPLASTY  ULTRASOUND GUIDANCE FOR VASCULAR ACCESS X2  The procedure, risks (including but not limited to bleeding, infection, organ damage ), benefits, and alternatives were explained  to the patient. Questions regarding the procedure were encouraged and answered. The patient understands and consents to the procedure.  Intravenous Fentanyl and Versed were administered as conscious sedation during continuous cardiorespiratory monitoring by the radiology RN, with a total moderate sedation time of sixty minutes.  The left upper arm straight synthetic graft just central to the arterial anastomosis was accessed antegrade with a 21-gauge micropuncture needle under real-time ultrasonic guidance after the overlying skin prepped with Betadine, draped in  usual sterile fashion, infiltrated locally with 1% lidocaine. Needle exchanged over 018 guidewire for transitional dilator through which $RemoveBefor'4mg'DtnYxsNxHbmP$  t-PA was administered. Ultrasound imaging documentation was saved. Through the dilator, a Bentson wire was advanced to the venous anastomosis. Over this a 73F sheath was placed, through which a 5 [place] Kumpe catheter was advanced for outflow venography. This showed patency of the outflow venous system through the SVC. 3000 units heparin were administered. The Arrow PTD device was used to macerate thrombus in the graft.  In similar fashion, the more central aspect of the graft near the venous anastomosis was accessed retrograde under ultrasound with a micropuncture needle, exchanged for a transitional dilator. The venous limb dilator was exchanged in similar fashion for a 6 French vascular sheath. With a Kumpe catheter, a guidewire was placed across the arterial anastomosis. Over the wire, a Fogarty catheter was used to dislodge the platelet plug into the gRaft, where it was macerated. The Fogarty was used again to remove any residual platelet plug from the arterial anastomosis. Injection showed clearance of thrombus from the arterial anastomosis with atheromatous irregularity of the native arterial circulation but no intraluminal filling defect. Balloon angioplasty of the graft including intra graft stent and the venous anastomosis was performed using a 7 mm x 4 cm Conquest angioplasty balloon with good response. Balloon was removed and injection showed patency of the anastomosis, without extravasation or other apparent complication. There is diffuse narrowing of the central aspect of the graft and some thrombus at the venous anastomosis. Because of difficulty passing the Treretola device across the venous anastomosis, I elected to use the Angiojet device over a guidewire. 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 There  is still some residual stenosis of the venous anastomosis. this was further dilated with an 8 mm x 4 cm Conquest balloon with good response. Follow venography shows no significant residual or recurrent stenosis. No extravasation, dissection, or other apparent complication.  A follow shuntogram was performed, demonstrating good flow through the outflow vein, no extravasation. Reflux across the arterial anastomosis demonstrate this to be widely patent, with unremarkable appearance of the visualized native arterial circulation. The catheter and sheaths were then removed and hemostasis achieved with 2-0 Ethilon sutures. Patient tolerated procedure well.  COMPARISON:  None  FLUOROSCOPY TIME:  18 min 48 seconds  ACCESS: Premature reocclusion may necessitate surgical consultation and revision  IMPRESSION: 1. Technically successful declot of left upper arm straight synthetic hemodialysis graft. 2. Technically successful balloon angioplasty of venous anastomotic stenosis up to 8 mm.   Electronically Signed   By: Arne Cleveland M.D.   On: 10/24/2013 15:48    ROS Constitutional: Negative for fever, fatigue, chills, weight loss,  Respiratory: has non productive cough (scant white sputum), no sob, denies hemoptysis, sputum production, wheezing and no stridor.  Cardiovascular: Negative for chest pain, palpitations, orthopnea, claudication, leg swelling and PND.  Gastrointestinal: Reports melena for the past 3 weeks. Today had nausea and vomiting in ED.  Possible heart burn (chronic). Negative for abdominal pain, diarrhea, constipation, hematachezia  Genitourinary: Patient Anuric due to ESRD  Musculoskeletal: Negative for myalgias, joint pain and falls.  Skin: Negative for itching and rash.  Neurological: Dizziness today with LOC (patient remembers only at HD unit) Negative for tremors, sensory change, focal weakness, seizures, Or headaches.  Blood pressure 157/85, pulse 86, temperature 97.6 F (36.4 C), temperature  source Oral, resp. rate 20, height $RemoveBe'5\' 11"'vLIRCpZfL$  (1.803 m), weight 88.3 kg (194 lb 10.7 oz), SpO2 100.00%.  PHYSICAL EXAMINATION:  General: Lethargic, febrile, no acute distress, no diaphoresis. Hypotensive with systolic in the 88'I  Neuro: Patient moves all extremities, no focal deficits, lethargic and in/out of sleep during interview  HEENT: Noted conjunctivits, PERRL, EOMI, no scleral edema  Cardiovascular: Tachycardic into the 130s during initial interview, no mrg, no heaves, normal s1 s2  Lungs: Clear on exam, unable to appreciate any rales or rhonchi even in left lung. Decrease breath sounds in left lung  Abdomen: Nontender, nondistended. Normal bowel sounds.  Musculoskeletal: No soreness, tenderness, or edema.  Skin: No rashes, sores, or skin breakdown  Assessment/Plan Possible NSTEMI ESRD with hemodialysis PVD DM, II-diet controlled HTN Aortic valve stenosis with regurgitation, mild Mitral valve stenosis with regurgitation, mild Tricuspid valve regurgitation Possible clot in Left atrium  TEE in AM. Cardiac catheterization soon.  Mersades Barbaro S 10/24/2013, 6:31 PM

## 2013-10-25 NOTE — Op Note (Signed)
Procedure: Attempted left internal jugular vein and right femoral Diatek catheter          Ultrasound-guided insertion of left  femoral Diatek catheter          Preoperative diagnosis: End-stage renal disease  Postoperative diagnosis: Same  Anesthesia: Local with IV sedation  Operative findings: 55 cm Diatek catheter left femoral vein  Operative details: After obtaining informed consent, the patient was taken to the operating room. The patient was placed in supine position on the operating room table. The patient's entire neck and chest were prepped and draped in usual sterile fashion. Ultrasound was used to examine the right side of the neck. The right side internal jugular vein was small atretic and presumably occluded. Attention was turned to the left side of the neck. The left internal jugular vein had normal compressibility and respiratory variation. Local anesthesia was entered over the left internal jugular vein. An introducer needle was used to cannulate the left internal jugular vein. The guidewire initially advanced to the chest level but would not advance further. The left internal jugular vein was cannulated several times successfully from a different angle but I was unable to get the guidewire to pass into the central venous structures. At this point attempts to place the left internal jugular vein catheter were abandoned. Both groins were prepped and draped in usual sterile fashion. Ultrasound was used to identify the patient's right femoral vein. This had normal compressibility and respiratory variation. Local anesthesia was infiltrated over the right femoral vein.  Using ultrasound guidance, the right femoral vein was successfully cannulated.  A 0.035 J-tipped guidewire was threaded into the right femoral vein and into the inferior vena cava followed by the right atrium under fluoroscopic guidance.   Next sequential 12 and 14 dilators were placed over the guidewire into the right femoral  vein. While attempting to place a 16 French dilator the guidewire kinked at the level of the iliac vein origin. I was unable to pull the dilator back without removing the wire and unable to advance the dilator or wire.. Therefore the entire apparatus was removed and hemostasis obtained with direct pressure.  Attention was then turned back to the left groin.    Local anesthesia was infiltrated over the left femoral vein.  Using ultrasound guidance, the left femoral vein was successfully cannulated.  A 0.035 J-tipped guidewire was threaded into the left femoral vein but the wire would not easily advance into the inferior vena cava. Therefore a 5 French straight catheter was placed over the guidewire up to the level of the caval bifurcation. A contrast angiogram was performed through the 5 French straight catheter. This showed that there was tortuosity and some kinking of the left iliac vein. An 035 angled Glidewire was brought upon the field and this was advanced through the iliac bifurcation up into the vena cava and finally in the right atrium under fluoroscopic guidance. A 5 French straight catheter was advanced over this to the right atrium. The Glidewire was then removed and exchanged for an 035 Bentson wire.  Next sequential 12, 14 and 16 French dilators with a peel-away sheath placed over the guidewire into the left femoral vein. The guidewire and dilator were removed. A 55 cm Diatek catheter was then placed through the peel away sheath using an over-the-wire technique into the right atrium.  The catheter was then tunneled subcutaneously, cut to length, and the hub attached. The catheter was noted to flush and draw easily. The catheter was  inspected under fluoroscopy and found with its tip to be in the right atrium without any kinks throughout its course. The catheter was sutured to the skin with nylon sutures. The groin insertion site was closed with Vicryl stitch. The catheter was then loaded with  concentrated Heparin solution. A dry sterile dressing was applied.  The patient tolerated procedure well and there were no complications. Instrument sponge and needle counts were correct at the end of the case. The patient was taken to the recovery room in stable condition. Chest x-ray will be obtained in the recovery room.  Ruta Hinds, MD [facility] Office: [phone] Pager: [phone]

## 2013-10-25 NOTE — Interval H&P Note (Signed)
History and Physical Interval Note:  10/25/2013 8:51 AM  Brad Mcintosh  has presented today for surgery, with the diagnosis of Possible thrombus in left atrium  The various methods of treatment have been discussed with the patient and family. After consideration of risks, benefits and other options for treatment, the patient has consented to  Procedure(s): TRANSESOPHAGEAL ECHOCARDIOGRAM (TEE) (N/A) as a surgical intervention .  The patient's history has been reviewed, patient examined, no change in status, stable for surgery.  I have reviewed the patient's chart and labs.  Questions were answered to the patient's satisfaction.     [name] S

## 2013-10-25 NOTE — Care Management Note (Signed)
    Page 1 of 1   10/26/2013     2:41:24 PM   CARE MANAGEMENT NOTE 10/26/2013  Patient:  RAMZI, BRATHWAITE   Account Number:  [id number]  Date Initiated:  10/25/2013  Documentation initiated by:  Tomi Bamberger  Subjective/Objective Assessment:   dx sepsis, esrd, clotted avg, suspected av thrombus.Marland Kitchen  admit- from home alone.     Action/Plan:   Anticipated DC Date:  10/26/2013   Anticipated DC Plan:  [place]  CM consult      Choice offered to / List presented to:             Status of service:  Completed, signed off Medicare Important Message given?   (If response is "NO", the following Medicare IM given date fields will be blank) Date Medicare IM given:   Date Additional Medicare IM given:    Discharge Disposition:  HOME/SELF CARE  Per UR Regulation:  Reviewed for med. necessity/level of care/duration of stay  If discussed at [place] of Stay Meetings, dates discussed:    Comments:  10/26/13 14:40 Tomi Bamberger RN, BSN [phone] patient for dc to home today, no needs anticipated.  10/25/13 17:43 Tomi Bamberger RN, BSN [phone] patient is from home alone, NCM will contine to follow for dc needs.

## 2013-10-25 NOTE — Transfer of Care (Signed)
Immediate Anesthesia Transfer of Care Note  Patient: Brad Mcintosh  Procedure(s) Performed: Procedure(s) with comments: INSERTION OF DIALYSIS CATHETER (N/A) - Attempted both right and left neck, procedure unsucessful.  Patient Location: PACU  Anesthesia Type:MAC  Level of Consciousness: awake, alert  and oriented  Airway & Oxygen Therapy: Patient Spontanous Breathing  Post-op Assessment: Report given to PACU RN  Post vital signs: Reviewed and stable  Complications: No apparent anesthesia complications

## 2013-10-25 NOTE — Preoperative (Signed)
Beta Blockers   Reason not to administer Beta Blockers:Not Applicable 

## 2013-10-25 NOTE — Progress Notes (Signed)
VASCULAR LAB PRELIMINARY RESULTS  Upper Extremity Arterial Doppler completed  --Unable to do pressures on the left due to restricted arm.    RIGHT   LEFT    PRESSURE WAVEFORM  PRESSURE WAVEFORM  SUBCLAVIAN NA Tri SUBCLAVIAN NA Tri  AXILLARY NA Tri AXILLARY NA Tri  BRACHIAL 183 Tri BRACHIAL  Bi- Atypical  RADIAL 146 Bi RADIAL  Bi  ULNAR noncompressible Bi ULNAR  Not insonated    RIGHT LEFT  PALMAR ARCH      [place], RDMS, RVT  10/25/2013 1:25 PM

## 2013-10-26 ENCOUNTER — Encounter (HOSPITAL_COMMUNITY): Payer: Self-pay | Admitting: Cardiovascular Disease

## 2013-10-26 MED ORDER — ASPIRIN EC 81 MG PO TBEC
81.0000 mg | DELAYED_RELEASE_TABLET | Freq: Every day | ORAL | Status: DC
  Administered 2013-10-26: 15:00:00 81 mg via ORAL
  Filled 2013-10-26: qty 1

## 2013-10-26 MED ORDER — LEVOFLOXACIN 500 MG PO TABS: 500.0000 mg | ORAL_TABLET | ORAL | Status: DC

## 2013-10-26 MED ORDER — LEVOFLOXACIN 500 MG PO TABS: 500.0000 mg | ORAL_TABLET | Freq: Every day | ORAL | Status: DC

## 2013-10-26 MED ORDER — ASPIRIN 81 MG PO TBEC: 81.0000 mg | DELAYED_RELEASE_TABLET | Freq: Every day | ORAL | Status: AC

## 2013-10-26 MED ORDER — CALCIUM ACETATE 667 MG PO CAPS
667.0000 mg | ORAL_CAPSULE | Freq: Three times a day (TID) | ORAL | Status: DC
  Administered 2013-10-26 (×2): 667 mg via ORAL
  Filled 2013-10-26 (×3): qty 1

## 2013-10-26 MED ORDER — DOXERCALCIFEROL 4 MCG/2ML IV SOLN
10.0000 ug | INTRAVENOUS | Status: DC
  Filled 2013-10-26: qty 6

## 2013-10-26 MED ORDER — DOXERCALCIFEROL 4 MCG/2ML IV SOLN: 2.0000 ug | INTRAVENOUS | Status: DC

## 2013-10-26 NOTE — Progress Notes (Signed)
PATIENT DETAILS Name: Brad Mcintosh Age: 79 y.o. Sex: male Date of Birth: 30-Jun-1935 Admit Date: 10/21/2013 Admitting Physician Raynelle Jan, MD ERX:[id number] S, MD  Subjective: No major issues, wants to go home!  Assessment/Plan: Sepsis  -resolved  - Likely secondary to pneumonia  -Blood culture no growth to date - Respiratory virus panel negative so far - Sputum culture negative  Acute respiratory failure with hypoxia - Secondary to pneumonia -off O2   Healthcare associated pneumonia - Clinically improving - No fever, cultures negative - Continue with Zosyn and Zithromax  Demand ischemia - Cardiology following - Spoke with Dr Doylene Canard, he plans to do further work up including possible Left heart catheterization in the outpatient setting - Currently without chest pain or shortness of breath  ? Suspected left atrial thrombus - Seen on transthoracic echocardiogram, however transesophageal echocardiogram negative  Melena  -GI followed  -Heme negative  -No recurrence and GI suspects initial hemoglobin spurious result  End-stage renal disease - On hemodialysis - Unfortunately left arm AVG unsalvageable,is si/p hemodialysis catheter placement on 1/14. Awaiting further VVS eval-to see if patient needs any further procedures while inpatient  Anemia - Secondary to chronic kidney disease - No indications for transfusion at present  Hypothyroidism - Continue levothyroxine  Disposition: Remain inpatient-home either today or tomorrow-await VVS f/u  DVT Prophylaxis: Prophylactic  Heparin  Code Status: Full code   Family Communication None at bedside  Procedures:  None  CONSULTS:  cardiology and nephrology   MEDICATIONS: Scheduled Meds: . calcium acetate  667 mg Oral TID WC  . cinacalcet  60 mg Oral Q breakfast  . citalopram  20 mg Oral Daily  . darbepoetin  60 mcg Intravenous Q Tue-HD  . doxercalciferol  10 mcg Intravenous Q T,Th,Sa-HD  .  heparin subcutaneous  5,000 Units Subcutaneous Q8H  . metoprolol succinate  100 mg Oral Daily  . multivitamin  1 tablet Oral QHS  . pantoprazole  20 mg Oral Daily  . piperacillin-tazobactam (ZOSYN)  IV  2.25 g Intravenous Q8H  . simvastatin  10 mg Oral q1800   Continuous Infusions: . sodium chloride 20 mL/hr at 10/25/13 1538   PRN Meds:.sodium chloride, acetaminophen, hydrALAZINE, HYDROcodone-acetaminophen, labetalol, ondansetron (ZOFRAN) IV  Antibiotics: Anti-infectives   Start     Dose/Rate Route Frequency Ordered Stop   10/25/13 1800  azithromycin (ZITHROMAX) tablet 500 mg  Status:  Discontinued     500 mg Oral Daily 10/25/13 1432 10/26/13 0927   10/24/13 1800  oseltamivir (TAMIFLU) 6 MG/ML suspension 30 mg  Status:  Discontinued     30 mg Oral Every T-Th-Sa (1800) 10/22/13 1505 10/23/13 1114   10/24/13 1200  vancomycin (VANCOCIN) IVPB 1000 mg/200 mL premix  Status:  Discontinued     1,000 mg 200 mL/hr over 60 Minutes Intravenous Every T-Th-Sa (Hemodialysis) 10/21/13 1729 10/25/13 1425   10/24/13 1200  ceFEPIme (MAXIPIME) 2 g in dextrose 5 % 50 mL IVPB  Status:  Discontinued     2 g 100 mL/hr over 30 Minutes Intravenous Every T-Th-Sa (Hemodialysis) 10/21/13 1729 10/21/13 2155   10/23/13 2000  oseltamivir (TAMIFLU) 6 MG/ML suspension 30 mg  Status:  Discontinued     30 mg Oral Every M-W-F (2000) 10/21/13 2205 10/22/13 1505   10/21/13 2300  azithromycin (ZITHROMAX) 500 mg in dextrose 5 % 250 mL IVPB  Status:  Discontinued     500 mg 250 mL/hr over 60 Minutes Intravenous Every 24 hours 10/21/13 2148 10/25/13 1430   10/21/13  2300  piperacillin-tazobactam (ZOSYN) IVPB 2.25 g     2.25 g 100 mL/hr over 30 Minutes Intravenous 3 times per day 10/21/13 2158     10/21/13 2300  oseltamivir (TAMIFLU) 6 MG/ML suspension 30 mg     30 mg Oral  Once 10/21/13 2205 10/21/13 2301   10/21/13 2200  oseltamivir (TAMIFLU) capsule 75 mg  Status:  Discontinued     75 mg Oral Daily 10/21/13 2148 10/21/13  2159   10/21/13 1800  vancomycin (VANCOCIN) 2,000 mg in sodium chloride 0.9 % 500 mL IVPB     2,000 mg 250 mL/hr over 120 Minutes Intravenous NOW 10/21/13 1729 10/21/13 1953   10/21/13 1645  ceFEPIme (MAXIPIME) 2 g in dextrose 5 % 50 mL IVPB     2 g 100 mL/hr over 30 Minutes Intravenous  Once 10/21/13 1644 10/21/13 1745   10/21/13 1645  vancomycin (VANCOCIN) IVPB 1000 mg/200 mL premix  Status:  Discontinued     1,000 mg 200 mL/hr over 60 Minutes Intravenous  Once 10/21/13 1644 10/21/13 1729       PHYSICAL EXAM: Vital signs in last 24 hours: Filed Vitals:   10/26/13 0030 10/26/13 0056 10/26/13 0120 10/26/13 0539  BP: 134/84 148/86 144/82 136/69  Pulse: 70 72 74 71  Temp:   97.4 F (36.3 C) 98.3 F (36.8 C)  TempSrc:   Oral Oral  Resp: $Remo'19 18 20 20  'jtLBe$ Height:      Weight:   88.4 kg (194 lb 14.2 oz)   SpO2:   94% 95%    Weight change: 0.1 kg (3.5 oz) Filed Weights   10/25/13 0514 10/25/13 2115 10/26/13 0120  Weight: 89.8 kg (197 lb 15.6 oz) 89.9 kg (198 lb 3.1 oz) 88.4 kg (194 lb 14.2 oz)   Body mass index is 27.19 kg/(m^2).   Gen Exam: Awake and alert with clear speech.   Neck: Supple, No JVD.   Chest: B/L Clear.   CVS: S1 S2 Regular, no murmurs.  Abdomen: soft, BS +, non tender, non distended.  Extremities: no edema, lower extremities warm to touch. Neurologic: Non Focal.   Skin: No Rash.   Wounds: N/A.   Intake/Output from previous day:  Intake/Output Summary (Last 24 hours) at 10/26/13 1231 Last data filed at 10/26/13 0120  Gross per 24 hour  Intake    100 ml  Output   2550 ml  Net  -2450 ml     LAB RESULTS: CBC  Recent Labs Lab 10/21/13 1644  10/22/13 0245 10/22/13 0700 10/23/13 0055 10/24/13 0636 10/25/13 0515  WBC 5.0  --  16.5*  --  13.1* 13.4* 11.2*  HGB 5.2*  < > 11.1* 10.8* 10.2* 11.0* 10.9*  HCT 16.4*  < > 34.8* 33.4* 31.3* 33.2* 33.0*  PLT 100*  --  183  --  179 203 216  MCV 88.2  --  87.7  --  87.2 85.6 84.6  MCH 28.0  --  28.0  --   28.4 28.4 27.9  MCHC 31.7  --  31.9  --  32.6 33.1 33.0  RDW 18.9*  --  18.1*  --  18.4* 18.5* 18.5*  LYMPHSABS 0.3*  --   --   --   --   --   --   MONOABS 0.2  --   --   --   --   --   --   EOSABS 0.0  --   --   --   --   --   --  BASOSABS 0.0  --   --   --   --   --   --   < > = values in this interval not displayed.  Chemistries   Recent Labs Lab 10/21/13 1644 10/22/13 0504 10/23/13 0055 10/24/13 0636 10/25/13 0515  NA 143 140 135* 135* 131*  K 3.0* 4.9 4.3 5.0 4.9  CL 105 99 95* 94* 89*  CO2 $Re'22 24 24 22 22  'qyY$ GLUCOSE 87 127* 92 77 73  BUN 24* 40* 60* 72* 80*  CREATININE 4.49* 6.73* 8.47* 10.22* 11.76*  CALCIUM 6.8* 8.8 9.7 10.0 10.8*    CBG:  Recent Labs Lab 10/22/13 1141 10/22/13 1709 10/23/13 0722 10/23/13 1114 10/23/13 1610  GLUCAP 82 105* 78 116* 93    GFR Estimated Creatinine Clearance: 5.7 ml/min (by C-G formula based on Cr of 11.76).  Coagulation profile  Recent Labs Lab 10/21/13 2230  INR 1.21    Cardiac Enzymes  Recent Labs Lab 10/22/13 1130 10/22/13 1805 10/23/13 0055  TROPONINI 7.38* 4.96* 3.90*    No components found with this basename: POCBNP,  No results found for this basename: DDIMER,  in the last 72 hours No results found for this basename: HGBA1C,  in the last 72 hours No results found for this basename: CHOL, HDL, LDLCALC, TRIG, CHOLHDL, LDLDIRECT,  in the last 72 hours No results found for this basename: TSH, T4TOTAL, FREET3, T3FREE, THYROIDAB,  in the last 72 hours No results found for this basename: VITAMINB12, FOLATE, FERRITIN, TIBC, IRON, RETICCTPCT,  in the last 72 hours No results found for this basename: LIPASE, AMYLASE,  in the last 72 hours  Urine Studies No results found for this basename: UACOL, UAPR, USPG, UPH, UTP, UGL, UKET, UBIL, UHGB, UNIT, UROB, ULEU, UEPI, UWBC, URBC, UBAC, CAST, CRYS, UCOM, BILUA,  in the last 72 hours  MICROBIOLOGY: Recent Results (from the past 240 hour(s))  CULTURE, BLOOD (ROUTINE X  2)     Status: None   Collection Time    10/21/13  4:50 PM      Result Value Range Status   Specimen Description BLOOD RIGHT FOREARM   Final   Special Requests BOTTLES DRAWN AEROBIC AND ANAEROBIC 5CC   Final   Culture  Setup Time     Final   Value: 10/21/2013 22:05     Performed at [facility]   Culture     Final   Value:        BLOOD CULTURE RECEIVED NO GROWTH TO DATE CULTURE WILL BE HELD FOR 5 DAYS BEFORE ISSUING A FINAL NEGATIVE REPORT     Performed at [facility]   Report Status PENDING   Incomplete  CULTURE, BLOOD (ROUTINE X 2)     Status: None   Collection Time    10/21/13  5:05 PM      Result Value Range Status   Specimen Description BLOOD RIGHT WRIST   Final   Special Requests BOTTLES DRAWN AEROBIC AND ANAEROBIC 5CC   Final   Culture  Setup Time     Final   Value: 10/21/2013 22:04     Performed at [facility]   Culture     Final   Value:        BLOOD CULTURE RECEIVED NO GROWTH TO DATE CULTURE WILL BE HELD FOR 5 DAYS BEFORE ISSUING A FINAL NEGATIVE REPORT     Performed at [facility]   Report Status PENDING   Incomplete  MRSA PCR SCREENING  Status: None   Collection Time    10/21/13 10:14 PM      Result Value Range Status   MRSA by PCR NEGATIVE  NEGATIVE Final   Comment:            The GeneXpert MRSA Assay (FDA     approved for NASAL specimens     only), is one component of a     comprehensive MRSA colonization     surveillance program. It is not     intended to diagnose MRSA     infection nor to guide or     monitor treatment for     MRSA infections.  RESPIRATORY VIRUS PANEL     Status: None   Collection Time    10/21/13 11:04 PM      Result Value Range Status   Source - RVPAN NASAL SWAB   Corrected   Comment: CORRECTED ON 01/12 AT 0138: PREVIOUSLY REPORTED AS NASAL SWAB   Respiratory Syncytial Virus A NOT DETECTED   Final   Respiratory Syncytial Virus B NOT DETECTED   Final   Influenza A NOT DETECTED   Final   Influenza  B NOT DETECTED   Final   Parainfluenza 1 NOT DETECTED   Final   Parainfluenza 2 NOT DETECTED   Final   Parainfluenza 3 NOT DETECTED   Final   Metapneumovirus NOT DETECTED   Final   Rhinovirus NOT DETECTED   Final   Adenovirus NOT DETECTED   Final   Influenza A H1 NOT DETECTED   Final   Influenza A H3 NOT DETECTED   Final   Comment: (NOTE)           Normal Reference Range for each Analyte: NOT DETECTED     Testing performed using the Luminex xTAG Respiratory Viral Panel test     kit.     This test was developed and its performance characteristics determined     by [facility]. It has not been cleared or approved by the [place]     Food and Drug Administration. This test is used for clinical purposes.     It should not be regarded as investigational or for research. This     laboratory is certified under the [place] (CLIA) as qualified to perform high complexity     clinical laboratory testing.     Performed at [facility], EXPECTORATED SPUTUM-ASSESSMENT     Status: None   Collection Time    10/22/13  6:26 AM      Result Value Range Status   Specimen Description SPUTUM   Final   Special Requests Normal   Final   Sputum evaluation     Final   Value: THIS SPECIMEN IS ACCEPTABLE. RESPIRATORY CULTURE REPORT TO FOLLOW.   Report Status 10/22/2013 FINAL   Final  CULTURE, RESPIRATORY (NON-EXPECTORATED)     Status: None   Collection Time    10/22/13  6:26 AM      Result Value Range Status   Specimen Description SPUTUM   Final   Special Requests NONE   Final   Gram Stain     Final   Value: FEW WBC PRESENT, PREDOMINANTLY PMN     NO SQUAMOUS EPITHELIAL CELLS SEEN     FEW GRAM NEGATIVE RODS     RARE GRAM POSITIVE COCCI     IN PAIRS RARE GRAM POSITIVE RODS   Culture  Final   Value: NORMAL OROPHARYNGEAL FLORA     Performed at [facility]   Report Status 10/24/2013 FINAL   Final  SURGICAL PCR SCREEN      Status: None   Collection Time    10/25/13  2:54 PM      Result Value Range Status   MRSA, PCR NEGATIVE  NEGATIVE Final   Staphylococcus aureus NEGATIVE  NEGATIVE Final   Comment:            The Xpert SA Assay (FDA     approved for NASAL specimens     in patients over 36 years of age),     is one component of     a comprehensive surveillance     program.  Test performance has     been validated by [employer] for patients greater     than or equal to 110 year old.     It is not intended     to diagnose infection nor to     guide or monitor treatment.    RADIOLOGY STUDIES/RESULTS: Ir Pta Venous Left  10/24/2013   CLINICAL DATA:  Occluded dialysis graft  TECHNIQUE: DIALYSIS GRAFT DECLOT  VENOUS ANGIOPLASTY  ULTRASOUND GUIDANCE FOR VASCULAR ACCESS X2  The procedure, risks (including but not limited to bleeding, infection, organ damage ), benefits, and alternatives were explained to the patient. Questions regarding the procedure were encouraged and answered. The patient understands and consents to the procedure.  Intravenous Fentanyl and Versed were administered as conscious sedation during continuous cardiorespiratory monitoring by the radiology RN, with a total moderate sedation time of sixty minutes.  The left upper arm straight synthetic graft just central to the arterial anastomosis was accessed antegrade with a 21-gauge micropuncture needle under real-time ultrasonic guidance after the overlying skin prepped with Betadine, draped in usual sterile fashion, infiltrated locally with 1% lidocaine. Needle exchanged over 018 guidewire for transitional dilator through which $RemoveBefor'4mg'RxoAXTMjkWru$  t-PA was administered. Ultrasound imaging documentation was saved. Through the dilator, a Bentson wire was advanced to the venous anastomosis. Over this a 20F sheath was placed, through which a 5 [place] Kumpe catheter was advanced for outflow venography. This showed patency of the outflow venous system through the SVC.  3000 units heparin were administered. The Arrow PTD device was used to macerate thrombus in the graft.  In similar fashion, the more central aspect of the graft near the venous anastomosis was accessed retrograde under ultrasound with a micropuncture needle, exchanged for a transitional dilator. The venous limb dilator was exchanged in similar fashion for a 6 French vascular sheath. With a Kumpe catheter, a guidewire was placed across the arterial anastomosis. Over the wire, a Fogarty catheter was used to dislodge the platelet plug into the gRaft, where it was macerated. The Fogarty was used again to remove any residual platelet plug from the arterial anastomosis. Injection showed clearance of thrombus from the arterial anastomosis with atheromatous irregularity of the native arterial circulation but no intraluminal filling defect. Balloon angioplasty of the graft including intra graft stent and the venous anastomosis was performed using a 7 mm x 4 cm Conquest angioplasty balloon with good response. Balloon was removed and injection showed patency of the anastomosis, without extravasation or other apparent complication. There is diffuse narrowing of the central aspect of the graft and some thrombus at the venous anastomosis. Because of difficulty passing the Treretola device across the venous anastomosis, I elected to use the Angiojet device  over a guidewire. 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 There is still some residual stenosis of the venous anastomosis. this was further dilated with an 8 mm x 4 cm Conquest balloon with good response. Follow venography shows no significant residual or recurrent stenosis. No extravasation, dissection, or other apparent complication.  A follow shuntogram was performed, demonstrating good flow through the outflow vein, no extravasation. Reflux across the arterial anastomosis demonstrate this to be widely patent, with  unremarkable appearance of the visualized native arterial circulation. The catheter and sheaths were then removed and hemostasis achieved with 2-0 Ethilon sutures. Patient tolerated procedure well.  COMPARISON:  None  FLUOROSCOPY TIME:  18 min 48 seconds  ACCESS: Premature reocclusion may necessitate surgical consultation and revision  IMPRESSION: 1. Technically successful declot of left upper arm straight synthetic hemodialysis graft. 2. Technically successful balloon angioplasty of venous anastomotic stenosis up to 8 mm.   Electronically Signed   By: Arne Cleveland M.D.   On: 10/24/2013 15:48   Ir Av Dialysis Graft Declot  10/24/2013   CLINICAL DATA:  Occluded dialysis graft  TECHNIQUE: DIALYSIS GRAFT DECLOT  VENOUS ANGIOPLASTY  ULTRASOUND GUIDANCE FOR VASCULAR ACCESS X2  The procedure, risks (including but not limited to bleeding, infection, organ damage ), benefits, and alternatives were explained to the patient. Questions regarding the procedure were encouraged and answered. The patient understands and consents to the procedure.  Intravenous Fentanyl and Versed were administered as conscious sedation during continuous cardiorespiratory monitoring by the radiology RN, with a total moderate sedation time of sixty minutes.  The left upper arm straight synthetic graft just central to the arterial anastomosis was accessed antegrade with a 21-gauge micropuncture needle under real-time ultrasonic guidance after the overlying skin prepped with Betadine, draped in usual sterile fashion, infiltrated locally with 1% lidocaine. Needle exchanged over 018 guidewire for transitional dilator through which $RemoveBefor'4mg'VYDcJvoVstBh$  t-PA was administered. Ultrasound imaging documentation was saved. Through the dilator, a Bentson wire was advanced to the venous anastomosis. Over this a 54F sheath was placed, through which a 5 [place] Kumpe catheter was advanced for outflow venography. This showed patency of the outflow venous system through the SVC.  3000 units heparin were administered. The Arrow PTD device was used to macerate thrombus in the graft.  In similar fashion, the more central aspect of the graft near the venous anastomosis was accessed retrograde under ultrasound with a micropuncture needle, exchanged for a transitional dilator. The venous limb dilator was exchanged in similar fashion for a 6 French vascular sheath. With a Kumpe catheter, a guidewire was placed across the arterial anastomosis. Over the wire, a Fogarty catheter was used to dislodge the platelet plug into the gRaft, where it was macerated. The Fogarty was used again to remove any residual platelet plug from the arterial anastomosis. Injection showed clearance of thrombus from the arterial anastomosis with atheromatous irregularity of the native arterial circulation but no intraluminal filling defect. Balloon angioplasty of the graft including intra graft stent and the venous anastomosis was performed using a 7 mm x 4 cm Conquest angioplasty balloon with good response. Balloon was removed and injection showed patency of the anastomosis, without extravasation or other apparent complication. There is diffuse narrowing of the central aspect of the graft and some thrombus at the venous anastomosis. Because of difficulty passing the Treretola device across the venous anastomosis, I elected to use the Angiojet device over a guidewire. 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 There is still some residual stenosis of the venous anastomosis. this was further dilated with an 8 mm x 4 cm Conquest balloon with good response. Follow venography shows no significant residual or recurrent stenosis. No extravasation, dissection, or other apparent complication.  A follow shuntogram was performed, demonstrating good flow through the outflow vein, no extravasation. Reflux across the arterial anastomosis demonstrate this to be widely patent, with  unremarkable appearance of the visualized native arterial circulation. The catheter and sheaths were then removed and hemostasis achieved with 2-0 Ethilon sutures. Patient tolerated procedure well.  COMPARISON:  None  FLUOROSCOPY TIME:  18 min 48 seconds  ACCESS: Premature reocclusion may necessitate surgical consultation and revision  IMPRESSION: 1. Technically successful declot of left upper arm straight synthetic hemodialysis graft. 2. Technically successful balloon angioplasty of venous anastomotic stenosis up to 8 mm.   Electronically Signed   By: Arne Cleveland M.D.   On: 10/24/2013 15:48   Ir Angio Av Shunt Addl Access  10/24/2013   CLINICAL DATA:  Occluded dialysis graft  TECHNIQUE: DIALYSIS GRAFT DECLOT  VENOUS ANGIOPLASTY  ULTRASOUND GUIDANCE FOR VASCULAR ACCESS X2  The procedure, risks (including but not limited to bleeding, infection, organ damage ), benefits, and alternatives were explained to the patient. Questions regarding the procedure were encouraged and answered. The patient understands and consents to the procedure.  Intravenous Fentanyl and Versed were administered as conscious sedation during continuous cardiorespiratory monitoring by the radiology RN, with a total moderate sedation time of sixty minutes.  The left upper arm straight synthetic graft just central to the arterial anastomosis was accessed antegrade with a 21-gauge micropuncture needle under real-time ultrasonic guidance after the overlying skin prepped with Betadine, draped in usual sterile fashion, infiltrated locally with 1% lidocaine. Needle exchanged over 018 guidewire for transitional dilator through which $RemoveBefor'4mg'yWfBNOynEjGT$  t-PA was administered. Ultrasound imaging documentation was saved. Through the dilator, a Bentson wire was advanced to the venous anastomosis. Over this a 35F sheath was placed, through which a 5 [place] Kumpe catheter was advanced for outflow venography. This showed patency of the outflow venous system through the  SVC. 3000 units heparin were administered. The Arrow PTD device was used to macerate thrombus in the graft.  In similar fashion, the more central aspect of the graft near the venous anastomosis was accessed retrograde under ultrasound with a micropuncture needle, exchanged for a transitional dilator. The venous limb dilator was exchanged in similar fashion for a 6 French vascular sheath. With a Kumpe catheter, a guidewire was placed across the arterial anastomosis. Over the wire, a Fogarty catheter was used to dislodge the platelet plug into the gRaft, where it was macerated. The Fogarty was used again to remove any residual platelet plug from the arterial anastomosis. Injection showed clearance of thrombus from the arterial anastomosis with atheromatous irregularity of the native arterial circulation but no intraluminal filling defect. Balloon angioplasty of the graft including intra graft stent and the venous anastomosis was performed using a 7 mm x 4 cm Conquest angioplasty balloon with good response. Balloon was removed and injection showed patency of the anastomosis, without extravasation or other apparent complication. There is diffuse narrowing of the central aspect of the graft and some thrombus at the venous anastomosis. Because of difficulty passing the Treretola device across the venous anastomosis, I elected to use the Angiojet device over a guidewire. This resulted in significant improvement in the chronic mural thrombus within the graft and  stent. There is still some residual stenosis of the venous anastomosis. this was further dilated with an 8 mm x 4 cm Conquest balloon with good response. Follow venography shows no significant residual or recurrent stenosis. No extravasation, dissection, or other apparent complication.  A follow shuntogram was performed, demonstrating good flow through the outflow vein, no extravasation. Reflux across the arterial anastomosis demonstrate this to be widely patent,  with unremarkable appearance of the visualized native arterial circulation. The catheter and sheaths were then removed and hemostasis achieved with 2-0 Ethilon sutures. Patient tolerated procedure well.  COMPARISON:  None  FLUOROSCOPY TIME:  18 min 48 seconds  ACCESS: Premature reocclusion may necessitate surgical consultation and revision  IMPRESSION: 1. Technically successful declot of left upper arm straight synthetic hemodialysis graft. 2. Technically successful balloon angioplasty of venous anastomotic stenosis up to 8 mm.   Electronically Signed   By: Arne Cleveland M.D.   On: 10/24/2013 15:48   Ir US Guide Vasc Access Left  10/24/2013   CLINICAL DATA:  Occluded dialysis graft  TECHNIQUE: DIALYSIS GRAFT DECLOT  VENOUS ANGIOPLASTY  ULTRASOUND GUIDANCE FOR VASCULAR ACCESS X2  The procedure, risks (including but not limited to bleeding, infection, organ damage ), benefits, and alternatives were explained to the patient. Questions regarding the procedure were encouraged and answered. The patient understands and consents to the procedure.  Intravenous Fentanyl and Versed were administered as conscious sedation during continuous cardiorespiratory monitoring by the radiology RN, with a total moderate sedation time of sixty minutes.  The left upper arm straight synthetic graft just central to the arterial anastomosis was accessed antegrade with a 21-gauge micropuncture needle under real-time ultrasonic guidance after the overlying skin prepped with Betadine, draped in usual sterile fashion, infiltrated locally with 1% lidocaine. Needle exchanged over 018 guidewire for transitional dilator through which $RemoveBefor'4mg'VuMHGejaCzuB$  t-PA was administered. Ultrasound imaging documentation was saved. Through the dilator, a Bentson wire was advanced to the venous anastomosis. Over this a 68F sheath was placed, through which a 5 [place] Kumpe catheter was advanced for outflow venography. This showed patency of the outflow venous system through  the SVC. 3000 units heparin were administered. The Arrow PTD device was used to macerate thrombus in the graft.  In similar fashion, the more central aspect of the graft near the venous anastomosis was accessed retrograde under ultrasound with a micropuncture needle, exchanged for a transitional dilator. The venous limb dilator was exchanged in similar fashion for a 6 French vascular sheath. With a Kumpe catheter, a guidewire was placed across the arterial anastomosis. Over the wire, a Fogarty catheter was used to dislodge the platelet plug into the gRaft, where it was macerated. The Fogarty was used again to remove any residual platelet plug from the arterial anastomosis. Injection showed clearance of thrombus from the arterial anastomosis with atheromatous irregularity of the native arterial circulation but no intraluminal filling defect. Balloon angioplasty of the graft including intra graft stent and the venous anastomosis was performed using a 7 mm x 4 cm Conquest angioplasty balloon with good response. Balloon was removed and injection showed patency of the anastomosis, without extravasation or other apparent complication. There is diffuse narrowing of the central aspect of the graft and some thrombus at the venous anastomosis. Because of difficulty passing the Treretola device across the venous anastomosis, I elected to use the Angiojet device over a guidewire. 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 There is still some residual stenosis of  the venous anastomosis. this was further dilated with an 8 mm x 4 cm Conquest balloon with good response. Follow venography shows no significant residual or recurrent stenosis. No extravasation, dissection, or other apparent complication.  A follow shuntogram was performed, demonstrating good flow through the outflow vein, no extravasation. Reflux across the arterial anastomosis demonstrate this to be widely  patent, with unremarkable appearance of the visualized native arterial circulation. The catheter and sheaths were then removed and hemostasis achieved with 2-0 Ethilon sutures. Patient tolerated procedure well.  COMPARISON:  None  FLUOROSCOPY TIME:  18 min 48 seconds  ACCESS: Premature reocclusion may necessitate surgical consultation and revision  IMPRESSION: 1. Technically successful declot of left upper arm straight synthetic hemodialysis graft. 2. Technically successful balloon angioplasty of venous anastomotic stenosis up to 8 mm.   Electronically Signed   By: Arne Cleveland M.D.   On: 10/24/2013 15:48   Dg Chest Port 1 View  10/22/2013   CLINICAL DATA:  Fever and sepsis.  EXAM: PORTABLE CHEST - 1 VIEW  COMPARISON:  10/21/2013 and prior chest radiographs  FINDINGS: Cardiomegaly again noted.  Mid-lower lung airspace opacities slightly improved.  No new airspace disease, pulmonary edema or pneumothorax noted.  There is no evidence pleural effusion.  IMPRESSION: Slightly improved left lung airspace disease/pneumonia.   Electronically Signed   By: Hassan Rowan M.D.   On: 10/22/2013 08:11   Dg Chest Portable 1 View  10/21/2013   CLINICAL DATA:  Status post central line placement  EXAM: PORTABLE CHEST - 1 VIEW  COMPARISON:  10/21/2013  FINDINGS: Cardiac shadow is stable. Aortic calcifications are again seen. Left basilar infiltrate is again noted. The right lung remains clear. There is been placement of a right-sided central venous line although it is coiled within the neck. Is difficult to assess whether it lies completely within the vein or within the adjacent soft tissues. Clinical correlation as to blood withdrawal is recommended.  IMPRESSION: Status post central line placement coiled within the right neck. Correlation with the ability to aspirate blood is recommended.   Electronically Signed   By: Inez Catalina M.D.   On: 10/21/2013 20:28   Dg Chest Port 1 View  10/21/2013   CLINICAL DATA:  Loss of  consciousness  EXAM: PORTABLE CHEST - 1 VIEW  COMPARISON:  11/16/2012  FINDINGS: Cardiac shadow is mildly enlarged. The lungs are well aerated with diffuse increased density in the left mid and lower lung consistent with acute pneumonia. No bony abnormality is noted.  IMPRESSION: Left basilar pneumonia.   Electronically Signed   By: Inez Catalina M.D.   On: 10/21/2013 17:08    Oren Binet, MD  [facility] Pager:[phone]  If 7PM-7AM, please contact night-coverage [email or web address] Password [email or web address] 10/26/2013, 12:31 PM   LOS: 5 days

## 2013-10-26 NOTE — Progress Notes (Signed)
[facility]  Subjective  - He feels much better today.  Sitting up eating breakfast.   Objective 136/69 71 98.3 F (36.8 C) (Oral) 20 95%  Intake/Output Summary (Last 24 hours) at 10/26/13 0835 Last data filed at 10/26/13 0120  Gross per 24 hour  Intake    100 ml  Output   2550 ml  Net  -2450 ml   PE: diatek area C/D/I   Vein mapping completed on right upper extremity. Right cephalic is 3+ in size, but there are thrombus present at [facility] area  Assessment/Planning: POD #1 femoral Diatek Pending plan for AV fistula verses graft after review of vein mapping.    Laurence Slate [facility] 10/26/2013 8:35 AM --  Laboratory Lab Results:  Recent Labs  10/24/13 0636 10/25/13 0515  WBC 13.4* 11.2*  HGB 11.0* 10.9*  HCT 33.2* 33.0*  PLT 203 216   BMET  Recent Labs  10/24/13 0636 10/25/13 0515  NA 135* 131*  K 5.0 4.9  CL 94* 89*  CO2 22 22  GLUCOSE 77 73  BUN 72* 80*  CREATININE 10.22* 11.76*  CALCIUM 10.0 10.8*    COAG Lab Results  Component Value Date   INR 1.21 10/21/2013   No results found for this basename: PTT

## 2013-10-26 NOTE — Progress Notes (Deleted)
[facility] Progress Note  Subjective:   No c/o. Ate about half of breakfast  Objective Filed Vitals:   10/26/13 0030 10/26/13 0056 10/26/13 0120 10/26/13 0539  BP: 134/84 148/86 144/82 136/69  Pulse: 70 72 74 71  Temp:   97.4 F (36.3 C) 98.3 F (36.8 C)  TempSrc:   Oral Oral  Resp: 19 18 20 20   Height:      Weight:   88.4 kg (194 lb 14.2 oz)   SpO2:   94% 95%   Physical Exam General: NAD supine in bed Heart: RRR 2/3 murmur Lungs: no wheezes or rales Abdomen: soft NT Extremities: no edema Dialysis Access:  left AVGG clotted; new left fem cath  [facility]  4h DW 84.5kg Profile 4 BFR 400 Heparin 10 K Epo 7200 Hectorol 10 - since October no Fe Recent labs:  Hgb 11.4 1/8 10.6 12/18 26% sat in Dec ferritin 1900 in Oct; iPTH 616 October Weight gains 2-3 Kg; BP variable; gets close to EDW; Ca usually in upper P range and P uncontrolled as outpt due to not taking binders  Assessment/Plan: 1. LLA PNA -  on Zosyn and Zithromax; prev on Vanc; Tmax 103 yesterday afternoon?? 2. Access Issues:   -clotted AVGG - reclotted post IR intervention; seen by VVS; graft degenerated with prox stent; has PAD not likely a thigh graft candidate; work up in progress for possible new right upper AGG 3. Anemia - Hgb stable 10.9 - on Aranesp 60 no Fe (noted transient melana heme neg) 4. Secondary hyperparathyroidism - hectorol dose at 2 - has not gotten yet; on 10 as an outpt; not sure why hypercalcemia 1/14 when he hadn't gotten any hectorol; recheck Ca today and evaluate; Use 2 Ca bath - If Ca is <10 would put back on prior hectorol dose of 10 5. HTN/volume - metoprolol 100 q d and volume control; ? Accuracy of post HD weight; suspect variable c/w meds as outpt as suggested by noncompliance with binders. 6. Nutrition - change to renal diet due to K content 7.  ESRD - Had HD that finished 1am today with post weight of 88.4; Net UF 2.5 Wed; PLAN HD today to get back on schedule - 3  hours and get standing weights. NEED to reduce heparin dose at d/c - ordered at 10K - shouldn't need that much 8.  + Troponins /NSTEMI- demands ischemia; Dr. Benedetto Coons following; lower volume TEE neg for atrial thrombus  Myriam Jacobson, PA-C [facility] [phone] 10/26/2013,10:06 AM  LOS: 5 days    Additional Objective Labs: Basic Metabolic Panel:  Recent Labs Lab 10/22/13 1130 10/23/13 0055 10/24/13 0636 10/25/13 0515  NA  --  135* 135* 131*  K  --  4.3 5.0 4.9  CL  --  95* 94* 89*  CO2  --  24 22 22   GLUCOSE  --  92 77 73  BUN  --  60* 72* 80*  CREATININE  --  8.47* 10.22* 11.76*  CALCIUM  --  9.7 10.0 10.8*  PHOS 5.2*  --  5.9*  --    Liver Function Tests:  Recent Labs Lab 10/21/13 1644 10/24/13 0636  AST 7  --   ALT <5  --   ALKPHOS 25*  --   BILITOT 0.4  --   PROT 5.4*  --   ALBUMIN 2.1* 2.6*   CBC:  Recent Labs Lab 10/21/13 1644  10/22/13 0245  10/23/13 0055 10/24/13 0636 10/25/13  0515  WBC 5.0  --  16.5*  --  13.1* 13.4* 11.2*  NEUTROABS 4.6  --   --   --   --   --   --   HGB 5.2*  < > 11.1*  < > 10.2* 11.0* 10.9*  HCT 16.4*  < > 34.8*  < > 31.3* 33.2* 33.0*  MCV 88.2  --  87.7  --  87.2 85.6 84.6  PLT 100*  --  183  --  179 203 216  < > = values in this interval not displayed. Blood Culture    Component Value Date/Time   SDES SPUTUM 10/22/2013 0626   SDES SPUTUM 10/22/2013 0626   SPECREQUEST Normal 10/22/2013 0626   SPECREQUEST NONE 10/22/2013 0626   CULT  Value: NORMAL OROPHARYNGEAL FLORA Performed at [facility] 10/22/2013 0626   REPTSTATUS 10/22/2013 FINAL 10/22/2013 0626   REPTSTATUS 10/24/2013 FINAL 10/22/2013 0626    Cardiac Enzymes:  Recent Labs Lab 10/21/13 2359 10/22/13 0503 10/22/13 1130 10/22/13 1805 10/23/13 0055  TROPONINI 2.43* 6.85* 7.38* 4.96* 3.90*   CBG:  Recent Labs Lab 10/22/13 1141 10/22/13 1709 10/23/13 0722 10/23/13 1114 10/23/13 1610  GLUCAP 82 105* 78 116* 93    I Medications: . sodium chloride 20 mL/hr at 10/25/13 1538   . calcium acetate  1,334 mg Oral TID WC  . cinacalcet  60 mg Oral Q breakfast  . citalopram  20 mg Oral Daily  . darbepoetin  60 mcg Intravenous Q Tue-HD  . doxercalciferol  10 mcg Intravenous Q T,Th,Sa-HD  . heparin subcutaneous  5,000 Units Subcutaneous Q8H  . metoprolol succinate  100 mg Oral Daily  . multivitamin  1 tablet Oral QHS  . pantoprazole  20 mg Oral Daily  . piperacillin-tazobactam (ZOSYN)  IV  2.25 g Intravenous Q8H  . simvastatin  10 mg Oral q1800

## 2013-10-26 NOTE — Progress Notes (Signed)
   VASCULAR PROGRESS NOTE  SUBJECTIVE: No complaints. Wants to go home.  PHYSICAL EXAM: Filed Vitals:   10/26/13 0030 10/26/13 0056 10/26/13 0120 10/26/13 0539  BP: 134/84 148/86 144/82 136/69  Pulse: 70 72 74 71  Temp:   97.4 F (36.3 C) 98.3 F (36.8 C)  TempSrc:   Oral Oral  Resp: 19 18 20 20   Height:      Weight:   194 lb 14.2 oz (88.4 kg)   SpO2:   94% 95%   No change in exam.  LABS: Lab Results  Component Value Date   WBC 11.2* 10/25/2013   HGB 10.9* 10/25/2013   HCT 33.0* 10/25/2013   MCV 84.6 10/25/2013   PLT 216 10/25/2013   Lab Results  Component Value Date   CREATININE 11.76* 10/25/2013   Lab Results  Component Value Date   INR 1.21 10/21/2013   CBG (last 3)   Recent Labs  10/23/13 1114 10/23/13 1610  GLUCAP 116* 93    Active Problems:   CKD (chronic kidney disease) stage V requiring chronic dialysis   Sepsis   Melena   Anemia in CKD (chronic kidney disease)   Acute respiratory failure with hypoxia   LLL pneumonia   Demand ischemia   Systolic murmur   Hypothyroidism  VEIN MAP: I reviewed his vein mapping of the right upper extremity which shows there is clot in the cephalic vein at the antecubital level on the right. The vein is noted to be thick walled and the forearm and also in the upper arm. The basilic vein on the right also has some chronic thrombus and thickening of the wall.  ARTERIAL DOPPLER: Reviewed his arterial Doppler which is biphasic Doppler signals in the radial and ulnar positions on the right. On the left side the radial signal is biphasic the ulnar signal cannot be obtained.   ASSESSMENT AND PLAN:  * 1 Day Post-Op s/p: Placement of left femoral dietary catheter  * Given the brachial artery stenosis noted on his last fistulogram of the left upper arm fistula, and the Doppler findings noted above, I do not think he is a good candidate for a new left upper arm graft. I think his next best option for access would be a right arm  fistula or graft. Based on his vein mapping it appears that he will likely require a graft. He is not a candidate for fine graft given his infrainguinal arterial occlusive disease.  * I spoke with Dr. Doylene Canard yesterday and he is completing his cardiac workup.  * The patient can be discharged from a vascular standpoint and we will arrange for outpatient placement of a new right arm access.  Gae Gallop Beeper: [phone] 10/26/2013

## 2013-10-26 NOTE — Progress Notes (Signed)
[facility] Progress Note   Subjective: TEE neg for clot. Cardiology planning heart cath as outpatient. New L thigh HD cath, VVS evaluating for next access as well.   Filed Vitals:   10/26/13 0030 10/26/13 0056 10/26/13 0120 10/26/13 0539  BP: 134/84 148/86 144/82 136/69  Pulse: 70 72 74 71  Temp:   97.4 F (36.3 C) 98.3 F (36.8 C)  TempSrc:   Oral Oral  Resp: 19 18 20 20   Height:      Weight:   88.4 kg (194 lb 14.2 oz)   SpO2:   94% 95%  Exam Alert, no distress No jvd Chest clear bilat RRR loud 2/6 M rusb radiating R carotid Abd soft, nt/nd No LE or UE edema No neuro deficits, ox3 LUA AVG no bruit  [facility]h  DW 84.5kg  Profile 4   BFR 400     Assessment/Plan: 1. LLL PNA: on zosyn per primary 2. Clotted graft L arm: unable to salvage, now with tunneled L thigh cath. VVS evaluating for next access, will know more later this am regarding timing 3. NSTEMI: outpt cath per Dr Doylene Canard 4. ESRD: HD again today to get back on schedule 5. HTN/volume: still up 4kg, HD today to dry wt 6. Anemia:  cont EPO 7. Sec HPTH: on sensipar, will decrease vit D due to high Ca and decrease phoslo back to 1ac, may need non-Ca binder  Kelly Splinter MD pager [phone]    cell [phone] 10/26/2013, 10:06 AM   Recent Labs Lab 10/22/13 1130 10/23/13 0055 10/24/13 0636 10/25/13 0515  NA  --  135* 135* 131*  K  --  4.3 5.0 4.9  CL  --  95* 94* 89*  CO2  --  24 22 22   GLUCOSE  --  92 77 73  BUN  --  60* 72* 80*  CREATININE  --  8.47* 10.22* 11.76*  CALCIUM  --  9.7 10.0 10.8*  PHOS 5.2*  --  5.9*  --     Recent Labs Lab 10/21/13 1644 10/24/13 0636  AST 7  --   ALT <5  --   ALKPHOS 25*  --   BILITOT 0.4  --   PROT 5.4*  --   ALBUMIN 2.1* 2.6*    Recent Labs Lab 10/21/13 1644  10/23/13 0055 10/24/13 0636 10/25/13 0515  WBC 5.0  < > 13.1* 13.4* 11.2*  NEUTROABS 4.6  --   --   --   --   HGB 5.2*  < > 10.2* 11.0* 10.9*  HCT 16.4*  < > 31.3*  33.2* 33.0*  MCV 88.2  < > 87.2 85.6 84.6  PLT 100*  < > 179 203 216  < > = values in this interval not displayed. . calcium acetate  1,334 mg Oral TID WC  . cinacalcet  60 mg Oral Q breakfast  . citalopram  20 mg Oral Daily  . darbepoetin  60 mcg Intravenous Q Tue-HD  . doxercalciferol  10 mcg Intravenous Q T,Th,Sa-HD  . heparin subcutaneous  5,000 Units Subcutaneous Q8H  . metoprolol succinate  100 mg Oral Daily  . multivitamin  1 tablet Oral QHS  . pantoprazole  20 mg Oral Daily  . piperacillin-tazobactam (ZOSYN)  IV  2.25 g Intravenous Q8H  . simvastatin  10 mg Oral q1800   . sodium chloride 20 mL/hr at 10/25/13 1538   sodium chloride, acetaminophen, hydrALAZINE, HYDROcodone-acetaminophen, labetalol, ondansetron (ZOFRAN) IV

## 2013-10-26 NOTE — Discharge Summary (Signed)
PATIENT DETAILS Name: Brad Mcintosh Age: 79 y.o. Sex: male Date of Birth: [birth year] MRN: [id number]. Admit Date: 10/21/2013 Admitting Physician: Levert Feinstein, MD VOF:[id number],ITQZ S, MD  Recommendations for Outpatient Follow-up:  1. Outpatient followup with [facility] for further hemodialysis access issues 2. Please recheck chest x-ray in 6-8 weeks to ensure resolution of the infiltrate 3. Further cardiac workup to be done at the discretion of Dr.Kadakia in the outpatient setting 4. Monitor CBC and chemistries periodically  PRIMARY DISCHARGE DIAGNOSIS:  Active Problems:   CKD (chronic kidney disease) stage V requiring chronic dialysis   Sepsis   Melena   Anemia in CKD (chronic kidney disease)   Acute respiratory failure with hypoxia   LLL pneumonia   Demand ischemia   Systolic murmur   Hypothyroidism      PAST MEDICAL HISTORY: Past Medical History  Diagnosis Date  . Hypertension   . Hyperlipidemia   . Cancer     prostate  . Gout   . Anemia     Iron deficiency  . Thyroid disease   . Malnutrition   . Atrial fibrillation     paroxysmal  . Chronic kidney disease     Pt has HD on TUESDAY, THURSDAY and SATURDAY  at [facility]  . Peripheral vascular disease     DISCHARGE MEDICATIONS:   Medication List    STOP taking these medications       HYDROcodone-acetaminophen 5-325 MG per tablet  Commonly known as:  NORCO/VICODIN     traMADol 50 MG tablet  Commonly known as:  ULTRAM      TAKE these medications       acetaminophen 160 MG/5ML suspension  Commonly known as:  TYLENOL  Take 1,000 mg by mouth every 6 (six) hours as needed.     aspirin 81 MG EC tablet  Take 1 tablet (81 mg total) by mouth daily.     calcium acetate 667 MG capsule  Commonly known as:  PHOSLO  Take 667 mg by mouth 3 (three) times daily with meals.     cinacalcet 60 MG tablet  Commonly known as:  SENSIPAR  Take 60 mg by mouth daily. Takes 1 tablet with evening meal     citalopram 20 MG tablet   Commonly known as:  CELEXA  Take 20 mg by mouth at bedtime. Depression     cyclobenzaprine 10 MG tablet  Commonly known as:  FLEXERIL  Take one at bed time for back pain and spasms     dexlansoprazole 60 MG capsule  Commonly known as:  DEXILANT  Take 60 mg by mouth daily.     gabapentin 100 MG capsule  Commonly known as:  NEURONTIN  Take 100 mg by mouth 2 (two) times daily.     levofloxacin 500 MG tablet  Commonly known as:  LEVAQUIN  Take 1 tablet (500 mg total) by mouth every other day.     metoprolol succinate 100 MG 24 hr tablet  Commonly known as:  TOPROL-XL  Take 100 mg by mouth daily.     ondansetron 4 MG disintegrating tablet  Commonly known as:  ZOFRAN-ODT  Take 4 mg by mouth every 8 (eight) hours as needed for nausea or vomiting.     simvastatin 10 MG tablet  Commonly known as:  ZOCOR  Take 10 mg by mouth at bedtime.        ALLERGIES:  No Known Allergies  BRIEF HPI:  See H&P, Labs, Consult and Test reports  for all details in brief, patient is a 79 year old African American male who was admitted for syncope and fever. He was found to have left lower lobe pneumonia and admitted by the critical care service for further evaluation and treatment.  CONSULTATIONS:   cardiology, nephrology and vascular surgery  PERTINENT RADIOLOGIC STUDIES: Ir Pta Venous Left  10/24/2013   CLINICAL DATA:  Occluded dialysis graft  TECHNIQUE: DIALYSIS GRAFT DECLOT  VENOUS ANGIOPLASTY  ULTRASOUND GUIDANCE FOR VASCULAR ACCESS X2  The procedure, risks (including but not limited to bleeding, infection, organ damage ), benefits, and alternatives were explained to the patient. Questions regarding the procedure were encouraged and answered. The patient understands and consents to the procedure.  Intravenous Fentanyl and Versed were administered as conscious sedation during continuous cardiorespiratory monitoring by the radiology RN, with a total moderate sedation time of sixty minutes.  The  left upper arm straight synthetic graft just central to the arterial anastomosis was accessed antegrade with a 21-gauge micropuncture needle under real-time ultrasonic guidance after the overlying skin prepped with Betadine, draped in usual sterile fashion, infiltrated locally with 1% lidocaine. Needle exchanged over 018 guidewire for transitional dilator through which $RemoveBefor'4mg'KghGfXhWCCgd$  t-PA was administered. Ultrasound imaging documentation was saved. Through the dilator, a Bentson wire was advanced to the venous anastomosis. Over this a 52F sheath was placed, through which a 5 [place] Kumpe catheter was advanced for outflow venography. This showed patency of the outflow venous system through the SVC. 3000 units heparin were administered. The Arrow PTD device was used to macerate thrombus in the graft.  In similar fashion, the more central aspect of the graft near the venous anastomosis was accessed retrograde under ultrasound with a micropuncture needle, exchanged for a transitional dilator. The venous limb dilator was exchanged in similar fashion for a 6 French vascular sheath. With a Kumpe catheter, a guidewire was placed across the arterial anastomosis. Over the wire, a Fogarty catheter was used to dislodge the platelet plug into the gRaft, where it was macerated. The Fogarty was used again to remove any residual platelet plug from the arterial anastomosis. Injection showed clearance of thrombus from the arterial anastomosis with atheromatous irregularity of the native arterial circulation but no intraluminal filling defect. Balloon angioplasty of the graft including intra graft stent and the venous anastomosis was performed using a 7 mm x 4 cm Conquest angioplasty balloon with good response. Balloon was removed and injection showed patency of the anastomosis, without extravasation or other apparent complication. There is diffuse narrowing of the central aspect of the graft and some thrombus at the venous anastomosis. Because  of difficulty passing the Treretola device across the venous anastomosis, I elected to use the Angiojet device over a guidewire. 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 There is still some residual stenosis of the venous anastomosis. this was further dilated with an 8 mm x 4 cm Conquest balloon with good response. Follow venography shows no significant residual or recurrent stenosis. No extravasation, dissection, or other apparent complication.  A follow shuntogram was performed, demonstrating good flow through the outflow vein, no extravasation. Reflux across the arterial anastomosis demonstrate this to be widely patent, with unremarkable appearance of the visualized native arterial circulation. The catheter and sheaths were then removed and hemostasis achieved with 2-0 Ethilon sutures. Patient tolerated procedure well.  COMPARISON:  None  FLUOROSCOPY TIME:  18 min 48 seconds  ACCESS: Premature reocclusion may necessitate surgical consultation and revision  IMPRESSION:  1. Technically successful declot of left upper arm straight synthetic hemodialysis graft. 2. Technically successful balloon angioplasty of venous anastomotic stenosis up to 8 mm.   Electronically Signed   By: Arne Cleveland M.D.   On: 10/24/2013 15:48   Ir Av Dialysis Graft Declot  10/24/2013   CLINICAL DATA:  Occluded dialysis graft  TECHNIQUE: DIALYSIS GRAFT DECLOT  VENOUS ANGIOPLASTY  ULTRASOUND GUIDANCE FOR VASCULAR ACCESS X2  The procedure, risks (including but not limited to bleeding, infection, organ damage ), benefits, and alternatives were explained to the patient. Questions regarding the procedure were encouraged and answered. The patient understands and consents to the procedure.  Intravenous Fentanyl and Versed were administered as conscious sedation during continuous cardiorespiratory monitoring by the radiology RN, with a total moderate sedation time of sixty minutes.  The  left upper arm straight synthetic graft just central to the arterial anastomosis was accessed antegrade with a 21-gauge micropuncture needle under real-time ultrasonic guidance after the overlying skin prepped with Betadine, draped in usual sterile fashion, infiltrated locally with 1% lidocaine. Needle exchanged over 018 guidewire for transitional dilator through which $RemoveBefor'4mg'OmZZwkGvPcQd$  t-PA was administered. Ultrasound imaging documentation was saved. Through the dilator, a Bentson wire was advanced to the venous anastomosis. Over this a 29F sheath was placed, through which a 5 [place] Kumpe catheter was advanced for outflow venography. This showed patency of the outflow venous system through the SVC. 3000 units heparin were administered. The Arrow PTD device was used to macerate thrombus in the graft.  In similar fashion, the more central aspect of the graft near the venous anastomosis was accessed retrograde under ultrasound with a micropuncture needle, exchanged for a transitional dilator. The venous limb dilator was exchanged in similar fashion for a 6 French vascular sheath. With a Kumpe catheter, a guidewire was placed across the arterial anastomosis. Over the wire, a Fogarty catheter was used to dislodge the platelet plug into the gRaft, where it was macerated. The Fogarty was used again to remove any residual platelet plug from the arterial anastomosis. Injection showed clearance of thrombus from the arterial anastomosis with atheromatous irregularity of the native arterial circulation but no intraluminal filling defect. Balloon angioplasty of the graft including intra graft stent and the venous anastomosis was performed using a 7 mm x 4 cm Conquest angioplasty balloon with good response. Balloon was removed and injection showed patency of the anastomosis, without extravasation or other apparent complication. There is diffuse narrowing of the central aspect of the graft and some thrombus at the venous anastomosis. Because  of difficulty passing the Treretola device across the venous anastomosis, I elected to use the Angiojet device over a guidewire. 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 There is still some residual stenosis of the venous anastomosis. this was further dilated with an 8 mm x 4 cm Conquest balloon with good response. Follow venography shows no significant residual or recurrent stenosis. No extravasation, dissection, or other apparent complication.  A follow shuntogram was performed, demonstrating good flow through the outflow vein, no extravasation. Reflux across the arterial anastomosis demonstrate this to be widely patent, with unremarkable appearance of the visualized native arterial circulation. The catheter and sheaths were then removed and hemostasis achieved with 2-0 Ethilon sutures. Patient tolerated procedure well.  COMPARISON:  None  FLUOROSCOPY TIME:  18 min 48 seconds  ACCESS: Premature reocclusion may necessitate surgical consultation and revision  IMPRESSION: 1. Technically successful declot of left upper arm straight  synthetic hemodialysis graft. 2. Technically successful balloon angioplasty of venous anastomotic stenosis up to 8 mm.   Electronically Signed   By: Arne Cleveland M.D.   On: 10/24/2013 15:48   Ir Angio Av Shunt Addl Access  10/24/2013   CLINICAL DATA:  Occluded dialysis graft  TECHNIQUE: DIALYSIS GRAFT DECLOT  VENOUS ANGIOPLASTY  ULTRASOUND GUIDANCE FOR VASCULAR ACCESS X2  The procedure, risks (including but not limited to bleeding, infection, organ damage ), benefits, and alternatives were explained to the patient. Questions regarding the procedure were encouraged and answered. The patient understands and consents to the procedure.  Intravenous Fentanyl and Versed were administered as conscious sedation during continuous cardiorespiratory monitoring by the radiology RN, with a total moderate sedation time of sixty minutes.  The  left upper arm straight synthetic graft just central to the arterial anastomosis was accessed antegrade with a 21-gauge micropuncture needle under real-time ultrasonic guidance after the overlying skin prepped with Betadine, draped in usual sterile fashion, infiltrated locally with 1% lidocaine. Needle exchanged over 018 guidewire for transitional dilator through which $RemoveBefor'4mg'LAeazBenIKTn$  t-PA was administered. Ultrasound imaging documentation was saved. Through the dilator, a Bentson wire was advanced to the venous anastomosis. Over this a 35F sheath was placed, through which a 5 [place] Kumpe catheter was advanced for outflow venography. This showed patency of the outflow venous system through the SVC. 3000 units heparin were administered. The Arrow PTD device was used to macerate thrombus in the graft.  In similar fashion, the more central aspect of the graft near the venous anastomosis was accessed retrograde under ultrasound with a micropuncture needle, exchanged for a transitional dilator. The venous limb dilator was exchanged in similar fashion for a 6 French vascular sheath. With a Kumpe catheter, a guidewire was placed across the arterial anastomosis. Over the wire, a Fogarty catheter was used to dislodge the platelet plug into the gRaft, where it was macerated. The Fogarty was used again to remove any residual platelet plug from the arterial anastomosis. Injection showed clearance of thrombus from the arterial anastomosis with atheromatous irregularity of the native arterial circulation but no intraluminal filling defect. Balloon angioplasty of the graft including intra graft stent and the venous anastomosis was performed using a 7 mm x 4 cm Conquest angioplasty balloon with good response. Balloon was removed and injection showed patency of the anastomosis, without extravasation or other apparent complication. There is diffuse narrowing of the central aspect of the graft and some thrombus at the venous anastomosis. Because  of difficulty passing the Treretola device across the venous anastomosis, I elected to use the Angiojet device over a guidewire. 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 There is still some residual stenosis of the venous anastomosis. this was further dilated with an 8 mm x 4 cm Conquest balloon with good response. Follow venography shows no significant residual or recurrent stenosis. No extravasation, dissection, or other apparent complication.  A follow shuntogram was performed, demonstrating good flow through the outflow vein, no extravasation. Reflux across the arterial anastomosis demonstrate this to be widely patent, with unremarkable appearance of the visualized native arterial circulation. The catheter and sheaths were then removed and hemostasis achieved with 2-0 Ethilon sutures. Patient tolerated procedure well.  COMPARISON:  None  FLUOROSCOPY TIME:  18 min 48 seconds  ACCESS: Premature reocclusion may necessitate surgical consultation and revision  IMPRESSION: 1. Technically successful declot of left upper arm straight synthetic hemodialysis graft. 2. Technically successful balloon angioplasty  of venous anastomotic stenosis up to 8 mm.   Electronically Signed   By: Arne Cleveland M.D.   On: 10/24/2013 15:48   Ir US Guide Vasc Access Left  10/24/2013   CLINICAL DATA:  Occluded dialysis graft  TECHNIQUE: DIALYSIS GRAFT DECLOT  VENOUS ANGIOPLASTY  ULTRASOUND GUIDANCE FOR VASCULAR ACCESS X2  The procedure, risks (including but not limited to bleeding, infection, organ damage ), benefits, and alternatives were explained to the patient. Questions regarding the procedure were encouraged and answered. The patient understands and consents to the procedure.  Intravenous Fentanyl and Versed were administered as conscious sedation during continuous cardiorespiratory monitoring by the radiology RN, with a total moderate sedation time of sixty minutes.  The  left upper arm straight synthetic graft just central to the arterial anastomosis was accessed antegrade with a 21-gauge micropuncture needle under real-time ultrasonic guidance after the overlying skin prepped with Betadine, draped in usual sterile fashion, infiltrated locally with 1% lidocaine. Needle exchanged over 018 guidewire for transitional dilator through which $RemoveBefor'4mg'TDQALkUAnddu$  t-PA was administered. Ultrasound imaging documentation was saved. Through the dilator, a Bentson wire was advanced to the venous anastomosis. Over this a 73F sheath was placed, through which a 5 [place] Kumpe catheter was advanced for outflow venography. This showed patency of the outflow venous system through the SVC. 3000 units heparin were administered. The Arrow PTD device was used to macerate thrombus in the graft.  In similar fashion, the more central aspect of the graft near the venous anastomosis was accessed retrograde under ultrasound with a micropuncture needle, exchanged for a transitional dilator. The venous limb dilator was exchanged in similar fashion for a 6 French vascular sheath. With a Kumpe catheter, a guidewire was placed across the arterial anastomosis. Over the wire, a Fogarty catheter was used to dislodge the platelet plug into the gRaft, where it was macerated. The Fogarty was used again to remove any residual platelet plug from the arterial anastomosis. Injection showed clearance of thrombus from the arterial anastomosis with atheromatous irregularity of the native arterial circulation but no intraluminal filling defect. Balloon angioplasty of the graft including intra graft stent and the venous anastomosis was performed using a 7 mm x 4 cm Conquest angioplasty balloon with good response. Balloon was removed and injection showed patency of the anastomosis, without extravasation or other apparent complication. There is diffuse narrowing of the central aspect of the graft and some thrombus at the venous anastomosis. Because  of difficulty passing the Treretola device across the venous anastomosis, I elected to use the Angiojet device over a guidewire. 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 There is still some residual stenosis of the venous anastomosis. this was further dilated with an 8 mm x 4 cm Conquest balloon with good response. Follow venography shows no significant residual or recurrent stenosis. No extravasation, dissection, or other apparent complication.  A follow shuntogram was performed, demonstrating good flow through the outflow vein, no extravasation. Reflux across the arterial anastomosis demonstrate this to be widely patent, with unremarkable appearance of the visualized native arterial circulation. The catheter and sheaths were then removed and hemostasis achieved with 2-0 Ethilon sutures. Patient tolerated procedure well.  COMPARISON:  None  FLUOROSCOPY TIME:  18 min 48 seconds  ACCESS: Premature reocclusion may necessitate surgical consultation and revision  IMPRESSION: 1. Technically successful declot of left upper arm straight synthetic hemodialysis graft. 2. Technically successful balloon angioplasty of venous anastomotic stenosis up to 8 mm.  Electronically Signed   By: Oley Balm M.D.   On: 10/24/2013 15:48   Dg Chest Port 1 View  10/25/2013   CLINICAL DATA:  Central catheter placement  EXAM: PORTABLE CHEST - 1 VIEW  COMPARISON:  None.  FINDINGS: There is a catheter extending from inferior to the chest which has its tip overlying the right atrium. No pneumothorax. There is cardiomegaly with mild interstitial edema. No consolidation. No adenopathy. There is atherosclerotic change in aorta.  IMPRESSION: There is a catheter extending from below the chest with tip overlying right atrium. Evidence of a degree of congestive heart failure.   Electronically Signed   By: Bretta Bang M.D.   On: 10/25/2013 18:56   Dg Chest Port 1 View  10/22/2013    CLINICAL DATA:  Fever and sepsis.  EXAM: PORTABLE CHEST - 1 VIEW  COMPARISON:  10/21/2013 and prior chest radiographs  FINDINGS: Cardiomegaly again noted.  Mid-lower lung airspace opacities slightly improved.  No new airspace disease, pulmonary edema or pneumothorax noted.  There is no evidence pleural effusion.  IMPRESSION: Slightly improved left lung airspace disease/pneumonia.   Electronically Signed   By: Laveda Abbe M.D.   On: 10/22/2013 08:11   Dg Chest Portable 1 View  10/21/2013   CLINICAL DATA:  Status post central line placement  EXAM: PORTABLE CHEST - 1 VIEW  COMPARISON:  10/21/2013  FINDINGS: Cardiac shadow is stable. Aortic calcifications are again seen. Left basilar infiltrate is again noted. The right lung remains clear. There is been placement of a right-sided central venous line although it is coiled within the neck. Is difficult to assess whether it lies completely within the vein or within the adjacent soft tissues. Clinical correlation as to blood withdrawal is recommended.  IMPRESSION: Status post central line placement coiled within the right neck. Correlation with the ability to aspirate blood is recommended.   Electronically Signed   By: Alcide Clever M.D.   On: 10/21/2013 20:28   Dg Chest Port 1 View  10/21/2013   CLINICAL DATA:  Loss of consciousness  EXAM: PORTABLE CHEST - 1 VIEW  COMPARISON:  11/16/2012  FINDINGS: Cardiac shadow is mildly enlarged. The lungs are well aerated with diffuse increased density in the left mid and lower lung consistent with acute pneumonia. No bony abnormality is noted.  IMPRESSION: Left basilar pneumonia.   Electronically Signed   By: Alcide Clever M.D.   On: 10/21/2013 17:08   Dg Fluoro Guide Cv Line-no Report  10/25/2013   CLINICAL DATA: ESRD   FLOURO GUIDE CV LINE  Fluoroscopy was utilized by the requesting physician.  No radiographic  interpretation.      PERTINENT LAB RESULTS: CBC:  Recent Labs  10/24/13 0636 10/25/13 0515  WBC 13.4*  11.2*  HGB 11.0* 10.9*  HCT 33.2* 33.0*  PLT 203 216   CMET CMP     Component Value Date/Time   NA 131* 10/25/2013 0515   K 4.9 10/25/2013 0515   CL 89* 10/25/2013 0515   CO2 22 10/25/2013 0515   GLUCOSE 73 10/25/2013 0515   BUN 80* 10/25/2013 0515   CREATININE 11.76* 10/25/2013 0515   CALCIUM 10.8* 10/25/2013 0515   CALCIUM 7.8* 12/07/2009 0558   PROT 5.4* 10/21/2013 1644   ALBUMIN 2.6* 10/24/2013 0636   AST 7 10/21/2013 1644   ALT <5 10/21/2013 1644   ALKPHOS 25* 10/21/2013 1644   BILITOT 0.4 10/21/2013 1644   GFRNONAA 4* 10/25/2013 0515   GFRAA 4* 10/25/2013 0515  GFR Estimated Creatinine Clearance: 5.7 ml/min (by C-G formula based on Cr of 11.76). No results found for this basename: LIPASE, AMYLASE,  in the last 72 hours No results found for this basename: CKTOTAL, CKMB, CKMBINDEX, TROPONINI,  in the last 72 hours No components found with this basename: POCBNP,  No results found for this basename: DDIMER,  in the last 72 hours No results found for this basename: HGBA1C,  in the last 72 hours No results found for this basename: CHOL, HDL, LDLCALC, TRIG, CHOLHDL, LDLDIRECT,  in the last 72 hours No results found for this basename: TSH, T4TOTAL, FREET3, T3FREE, THYROIDAB,  in the last 72 hours No results found for this basename: VITAMINB12, FOLATE, FERRITIN, TIBC, IRON, RETICCTPCT,  in the last 72 hours Coags: No results found for this basename: PT, INR,  in the last 72 hours Microbiology: Recent Results (from the past 240 hour(s))  CULTURE, BLOOD (ROUTINE X 2)     Status: None   Collection Time    10/21/13  4:50 PM      Result Value Range Status   Specimen Description BLOOD RIGHT FOREARM   Final   Special Requests BOTTLES DRAWN AEROBIC AND ANAEROBIC 5CC   Final   Culture  Setup Time     Final   Value: 10/21/2013 22:05     Performed at [facility]   Culture     Final   Value:        BLOOD CULTURE RECEIVED NO GROWTH TO DATE CULTURE WILL BE HELD FOR 5 DAYS BEFORE ISSUING  A FINAL NEGATIVE REPORT     Performed at [facility]   Report Status PENDING   Incomplete  CULTURE, BLOOD (ROUTINE X 2)     Status: None   Collection Time    10/21/13  5:05 PM      Result Value Range Status   Specimen Description BLOOD RIGHT WRIST   Final   Special Requests BOTTLES DRAWN AEROBIC AND ANAEROBIC 5CC   Final   Culture  Setup Time     Final   Value: 10/21/2013 22:04     Performed at [facility]   Culture     Final   Value:        BLOOD CULTURE RECEIVED NO GROWTH TO DATE CULTURE WILL BE HELD FOR 5 DAYS BEFORE ISSUING A FINAL NEGATIVE REPORT     Performed at [facility]   Report Status PENDING   Incomplete  MRSA PCR SCREENING     Status: None   Collection Time    10/21/13 10:14 PM      Result Value Range Status   MRSA by PCR NEGATIVE  NEGATIVE Final   Comment:            The GeneXpert MRSA Assay (FDA     approved for NASAL specimens     only), is one component of a     comprehensive MRSA colonization     surveillance program. It is not     intended to diagnose MRSA     infection nor to guide or     monitor treatment for     MRSA infections.  RESPIRATORY VIRUS PANEL     Status: None   Collection Time    10/21/13 11:04 PM      Result Value Range Status   Source - RVPAN NASAL SWAB   Corrected   Comment: CORRECTED ON 01/12 AT 0138: PREVIOUSLY REPORTED AS NASAL SWAB  Respiratory Syncytial Virus A NOT DETECTED   Final   Respiratory Syncytial Virus B NOT DETECTED   Final   Influenza A NOT DETECTED   Final   Influenza B NOT DETECTED   Final   Parainfluenza 1 NOT DETECTED   Final   Parainfluenza 2 NOT DETECTED   Final   Parainfluenza 3 NOT DETECTED   Final   Metapneumovirus NOT DETECTED   Final   Rhinovirus NOT DETECTED   Final   Adenovirus NOT DETECTED   Final   Influenza A H1 NOT DETECTED   Final   Influenza A H3 NOT DETECTED   Final   Comment: (NOTE)           Normal Reference Range for each Analyte: NOT DETECTED     Testing performed  using the Luminex xTAG Respiratory Viral Panel test     kit.     This test was developed and its performance characteristics determined     by [employer] [facility]. It has not been cleared or approved by the [place]     Food and Drug Administration. This test is used for clinical purposes.     It should not be regarded as investigational or for research. This     laboratory is certified under the [place] (CLIA) as qualified to perform high complexity     clinical laboratory testing.     Performed at [facility], EXPECTORATED SPUTUM-ASSESSMENT     Status: None   Collection Time    10/22/13  6:26 AM      Result Value Range Status   Specimen Description SPUTUM   Final   Special Requests Normal   Final   Sputum evaluation     Final   Value: THIS SPECIMEN IS ACCEPTABLE. RESPIRATORY CULTURE REPORT TO FOLLOW.   Report Status 10/22/2013 FINAL   Final  CULTURE, RESPIRATORY (NON-EXPECTORATED)     Status: None   Collection Time    10/22/13  6:26 AM      Result Value Range Status   Specimen Description SPUTUM   Final   Special Requests NONE   Final   Gram Stain     Final   Value: FEW WBC PRESENT, PREDOMINANTLY PMN     NO SQUAMOUS EPITHELIAL CELLS SEEN     FEW GRAM NEGATIVE RODS     RARE GRAM POSITIVE COCCI     IN PAIRS RARE GRAM POSITIVE RODS   Culture     Final   Value: NORMAL OROPHARYNGEAL FLORA     Performed at [facility]   Report Status 10/24/2013 FINAL   Final  SURGICAL PCR SCREEN     Status: None   Collection Time    10/25/13  2:54 PM      Result Value Range Status   MRSA, PCR NEGATIVE  NEGATIVE Final   Staphylococcus aureus NEGATIVE  NEGATIVE Final   Comment:            The Xpert SA Assay (FDA     approved for NASAL specimens     in patients over 55 years of age),     is one component of     a comprehensive surveillance     program.  Test performance has     been validated by [employer] for  patients greater     than or equal to 85 year old.  It is not intended     to diagnose infection nor to     guide or monitor treatment.     BRIEF HOSPITAL COURSE:  Sepsis  -resolved  - Likely secondary to pneumonia  -Blood culture no growth to date  - Respiratory virus panel negative so far  - Sputum culture negative  Acute respiratory failure with hypoxia  - Secondary to pneumonia  -off O2  Healthcare associated pneumonia  - Clinically improved - No fever, cultures negative  - On Zosyn and Zithromax,will transition toLevaquin 500 mg every 48 hours for 2 doses on discharge.   Demand ischemia  - Cardiology following  - Spoke with Dr Doylene Canard, he plans to do further work up including possible Left heart catheterization in the outpatient setting  - Currently without chest pain or shortness of breath  - On aspirin, statin and beta blockers which will be continued on discharge  ? Suspected left atrial thrombus  - Seen on transthoracic echocardiogram, however transesophageal echocardiogram negative   End-stage renal disease  - On hemodialysis  - Unfortunately left arm AVG unsalvageable,is si/p hemodialysis catheter placement on 1/14. Spoke with Dr. Zettie Pho on 1/15, okay to discharge, further access issues will be done with nephrology and VVS in the outpatient setting  Anemia  - Secondary to chronic kidney disease  - No indications for transfusion at present   Hypothyroidism  - Continue levothyroxine   TODAY-DAY OF DISCHARGE:  Subjective:   Arael Piccione today has no headache,no chest abdominal pain,no new weakness tingling or numbness, feels much better wants to go home today.   Objective:   Blood pressure 136/73, pulse 75, temperature 98.3 F (36.8 C), temperature source Oral, resp. rate 20, height $RemoveBe'5\' 11"'VxMkgDpye$  (1.803 m), weight 88.4 kg (194 lb 14.2 oz), SpO2 95.00%.  Intake/Output Summary (Last 24 hours) at 10/26/13 1432 Last data filed at 10/26/13 0120  Gross per 24 hour   Intake    100 ml  Output   2550 ml  Net  -2450 ml   Filed Weights   10/25/13 0514 10/25/13 2115 10/26/13 0120  Weight: 89.8 kg (197 lb 15.6 oz) 89.9 kg (198 lb 3.1 oz) 88.4 kg (194 lb 14.2 oz)    Exam Awake Alert, Oriented *3, No new F.N deficits, Normal affect Walker.AT,PERRAL Supple Neck,No JVD, No cervical lymphadenopathy appriciated.  Symmetrical Chest wall movement, Good air movement bilaterally, CTAB RRR,No Gallops,Rubs or new Murmurs, No Parasternal Heave +ve B.Sounds, Abd Soft, Non tender, No organomegaly appriciated, No rebound -guarding or rigidity. No Cyanosis, Clubbing or edema, No new Rash or bruise  DISCHARGE CONDITION: Stable  DISPOSITION: Home   DISCHARGE INSTRUCTIONS:    Activity:  As tolerated   Diet recommendation:  renal diet   Discharge Orders   Future Appointments Provider Department Dept Phone   08/22/2014 11:00 AM [facility] [address] [phone]   08/22/2014 11:40 AM Sharmon Leyden Nickel, NP [facility] [phone]   Future Orders Complete By Expires   Call MD for:  difficulty breathing, headache or visual disturbances  As directed    Call MD for:  temperature >100.4  As directed    Diet - low sodium heart healthy  As directed    Increase activity slowly  As directed       Follow-up Information   Follow up with [facility] S, MD. Schedule an appointment as soon as possible for a visit in 1 week.   Specialty:  Cardiology  Contact information:   [place] [address] [phone]       Please follow up. (at your usual schedule)    Contact information:   [facility]     Total Time spent on discharge equals 45 minutes.  SignedOren Binet 10/26/2013 2:32 PM

## 2013-10-27 ENCOUNTER — Encounter (HOSPITAL_COMMUNITY): Payer: Self-pay | Admitting: Vascular Surgery

## 2013-10-27 LAB — CULTURE, BLOOD (ROUTINE X 2)
Culture: NO GROWTH
Culture: NO GROWTH

## 2013-11-08 ENCOUNTER — Other Ambulatory Visit: Payer: Self-pay | Admitting: Cardiovascular Disease

## 2013-11-09 ENCOUNTER — Encounter (HOSPITAL_COMMUNITY)
Admission: RE | Disposition: A | Discharge status: Home / Self Care | Payer: Medicare Other | Source: Ambulatory Visit | Attending: Cardiovascular Disease

## 2013-11-09 ENCOUNTER — Encounter (HOSPITAL_COMMUNITY): Payer: Self-pay | Admitting: General Practice

## 2013-11-09 ENCOUNTER — Ambulatory Visit (HOSPITAL_COMMUNITY)
Admission: RE | Admit: 2013-11-09 | Discharge: 2013-11-10 | Disposition: A | Discharge status: Home / Self Care | Payer: Medicare Other | Source: Ambulatory Visit | Attending: Cardiovascular Disease | Admitting: Cardiovascular Disease

## 2013-11-09 DIAGNOSIS — D631 Anemia in chronic kidney disease: Secondary | ICD-10-CM | POA: Insufficient documentation

## 2013-11-09 DIAGNOSIS — Z87891 Personal history of nicotine dependence: Secondary | ICD-10-CM | POA: Insufficient documentation

## 2013-11-09 DIAGNOSIS — I2510 Atherosclerotic heart disease of native coronary artery without angina pectoris: Secondary | ICD-10-CM | POA: Diagnosis present

## 2013-11-09 DIAGNOSIS — M949 Disorder of cartilage, unspecified: Secondary | ICD-10-CM

## 2013-11-09 DIAGNOSIS — I739 Peripheral vascular disease, unspecified: Secondary | ICD-10-CM | POA: Insufficient documentation

## 2013-11-09 DIAGNOSIS — I4891 Unspecified atrial fibrillation: Secondary | ICD-10-CM | POA: Insufficient documentation

## 2013-11-09 DIAGNOSIS — Z8546 Personal history of malignant neoplasm of prostate: Secondary | ICD-10-CM | POA: Insufficient documentation

## 2013-11-09 DIAGNOSIS — I120 Hypertensive chronic kidney disease with stage 5 chronic kidney disease or end stage renal disease: Secondary | ICD-10-CM | POA: Insufficient documentation

## 2013-11-09 DIAGNOSIS — N186 End stage renal disease: Secondary | ICD-10-CM | POA: Insufficient documentation

## 2013-11-09 DIAGNOSIS — E785 Hyperlipidemia, unspecified: Secondary | ICD-10-CM | POA: Insufficient documentation

## 2013-11-09 DIAGNOSIS — I2582 Chronic total occlusion of coronary artery: Secondary | ICD-10-CM | POA: Insufficient documentation

## 2013-11-09 DIAGNOSIS — I2584 Coronary atherosclerosis due to calcified coronary lesion: Secondary | ICD-10-CM | POA: Insufficient documentation

## 2013-11-09 DIAGNOSIS — Z7982 Long term (current) use of aspirin: Secondary | ICD-10-CM | POA: Insufficient documentation

## 2013-11-09 DIAGNOSIS — I079 Rheumatic tricuspid valve disease, unspecified: Secondary | ICD-10-CM | POA: Insufficient documentation

## 2013-11-09 DIAGNOSIS — E119 Type 2 diabetes mellitus without complications: Secondary | ICD-10-CM | POA: Insufficient documentation

## 2013-11-09 DIAGNOSIS — E079 Disorder of thyroid, unspecified: Secondary | ICD-10-CM | POA: Insufficient documentation

## 2013-11-09 DIAGNOSIS — I200 Unstable angina: Secondary | ICD-10-CM | POA: Insufficient documentation

## 2013-11-09 DIAGNOSIS — I252 Old myocardial infarction: Secondary | ICD-10-CM | POA: Insufficient documentation

## 2013-11-09 DIAGNOSIS — I080 Rheumatic disorders of both mitral and aortic valves: Secondary | ICD-10-CM | POA: Insufficient documentation

## 2013-11-09 DIAGNOSIS — N039 Chronic nephritic syndrome with unspecified morphologic changes: Secondary | ICD-10-CM

## 2013-11-09 DIAGNOSIS — N2581 Secondary hyperparathyroidism of renal origin: Secondary | ICD-10-CM | POA: Insufficient documentation

## 2013-11-09 DIAGNOSIS — Z992 Dependence on renal dialysis: Secondary | ICD-10-CM | POA: Insufficient documentation

## 2013-11-09 DIAGNOSIS — M109 Gout, unspecified: Secondary | ICD-10-CM | POA: Insufficient documentation

## 2013-11-09 DIAGNOSIS — M1990 Unspecified osteoarthritis, unspecified site: Secondary | ICD-10-CM | POA: Insufficient documentation

## 2013-11-09 DIAGNOSIS — M899 Disorder of bone, unspecified: Secondary | ICD-10-CM | POA: Insufficient documentation

## 2013-11-09 DIAGNOSIS — I440 Atrioventricular block, first degree: Secondary | ICD-10-CM | POA: Insufficient documentation

## 2013-11-09 HISTORY — DX: Atherosclerotic heart disease of native coronary artery without angina pectoris: I25.10

## 2013-11-09 HISTORY — PX: PERCUTANEOUS CORONARY STENT INTERVENTION (PCI-S): SHX5485

## 2013-11-09 HISTORY — DX: Shortness of breath: R06.02

## 2013-11-09 HISTORY — PX: LEFT HEART CATHETERIZATION WITH CORONARY ANGIOGRAM: SHX5451

## 2013-11-09 HISTORY — PX: CORONARY ANGIOPLASTY: SHX604

## 2013-11-09 LAB — CBC
HEMATOCRIT: 30.8 % — AB (ref 39.0–52.0)
Hemoglobin: 9.9 g/dL — ABNORMAL LOW (ref 13.0–17.0)
MCH: 27.8 pg (ref 26.0–34.0)
MCHC: 32.1 g/dL (ref 30.0–36.0)
MCV: 86.5 fL (ref 78.0–100.0)
Platelets: 169 10*3/uL (ref 150–400)
RBC: 3.56 MIL/uL — AB (ref 4.22–5.81)
RDW: 18.8 % — ABNORMAL HIGH (ref 11.5–15.5)
WBC: 4.4 10*3/uL (ref 4.0–10.5)

## 2013-11-09 LAB — GLUCOSE, CAPILLARY
Glucose-Capillary: 65 mg/dL — ABNORMAL LOW (ref 70–99)
Glucose-Capillary: 94 mg/dL (ref 70–99)

## 2013-11-09 LAB — BASIC METABOLIC PANEL
BUN: 59 mg/dL — ABNORMAL HIGH (ref 6–23)
CHLORIDE: 95 meq/L — AB (ref 96–112)
CO2: 21 meq/L (ref 19–32)
Calcium: 8.9 mg/dL (ref 8.4–10.5)
Creatinine, Ser: 10.83 mg/dL — ABNORMAL HIGH (ref 0.50–1.35)
GFR calc Af Amer: 5 mL/min — ABNORMAL LOW (ref >90)
GFR calc non Af Amer: 4 mL/min — ABNORMAL LOW (ref >90)
GLUCOSE: 68 mg/dL — AB (ref 70–99)
Potassium: 5.4 mEq/L — ABNORMAL HIGH (ref 3.7–5.3)
Sodium: 137 mEq/L (ref 137–147)

## 2013-11-09 LAB — POCT I-STAT, CHEM 8
BUN: 53 mg/dL — ABNORMAL HIGH (ref 6–23)
CHLORIDE: 100 meq/L (ref 96–112)
CREATININE: 10.7 mg/dL — AB (ref 0.50–1.35)
Calcium, Ion: 1.27 mmol/L (ref 1.13–1.30)
GLUCOSE: 81 mg/dL (ref 70–99)
HCT: 31 % — ABNORMAL LOW (ref 39.0–52.0)
Hemoglobin: 10.5 g/dL — ABNORMAL LOW (ref 13.0–17.0)
Potassium: 4.7 mEq/L (ref 3.7–5.3)
Sodium: 136 mEq/L — ABNORMAL LOW (ref 137–147)
TCO2: 24 mmol/L (ref 0–100)

## 2013-11-09 LAB — POCT ACTIVATED CLOTTING TIME
ACTIVATED CLOTTING TIME: 337 s
Activated Clotting Time: 166 seconds

## 2013-11-09 LAB — PHOSPHORUS: Phosphorus: 9.5 mg/dL — ABNORMAL HIGH (ref 2.3–4.6)

## 2013-11-09 LAB — HEPATITIS B SURFACE ANTIGEN: HEP B S AG: NEGATIVE

## 2013-11-09 LAB — ALBUMIN: ALBUMIN: 2.8 g/dL — AB (ref 3.5–5.2)

## 2013-11-09 SURGERY — LEFT HEART CATHETERIZATION WITH CORONARY ANGIOGRAM
Anesthesia: LOCAL

## 2013-11-09 MED ORDER — DOXERCALCIFEROL 4 MCG/2ML IV SOLN
INTRAVENOUS | Status: AC
  Administered 2013-11-09: 8 ug via INTRAVENOUS
  Filled 2013-11-09: qty 4

## 2013-11-09 MED ORDER — SODIUM CHLORIDE 0.9 % IV SOLN: 100.0000 mL | INTRAVENOUS | Status: DC | PRN

## 2013-11-09 MED ORDER — CALCIUM ACETATE 667 MG PO CAPS
667.0000 mg | ORAL_CAPSULE | Freq: Three times a day (TID) | ORAL | Status: DC
  Filled 2013-11-09 (×3): qty 1

## 2013-11-09 MED ORDER — HEPARIN (PORCINE) IN NACL 2-0.9 UNIT/ML-% IJ SOLN
INTRAMUSCULAR | Status: AC
  Filled 2013-11-09: qty 1500

## 2013-11-09 MED ORDER — DOXERCALCIFEROL 4 MCG/2ML IV SOLN
8.0000 ug | INTRAVENOUS | Status: DC
  Administered 2013-11-09: 8 ug via INTRAVENOUS
  Filled 2013-11-09: qty 4

## 2013-11-09 MED ORDER — CITALOPRAM HYDROBROMIDE 20 MG PO TABS
20.0000 mg | ORAL_TABLET | Freq: Every day | ORAL | Status: DC
  Administered 2013-11-09 – 2013-11-10 (×2): 20 mg via ORAL
  Filled 2013-11-09 (×2): qty 1

## 2013-11-09 MED ORDER — ONDANSETRON HCL 4 MG/2ML IJ SOLN
4.0000 mg | Freq: Four times a day (QID) | INTRAMUSCULAR | Status: DC | PRN
Start: 2013-11-09 — End: 2013-11-10

## 2013-11-09 MED ORDER — HEPARIN SODIUM (PORCINE) 1000 UNIT/ML IJ SOLN
INTRAMUSCULAR | Status: AC
  Filled 2013-11-09: qty 1

## 2013-11-09 MED ORDER — ACETAMINOPHEN 325 MG PO TABS
ORAL_TABLET | ORAL | Status: AC
  Administered 2013-11-09: 650 mg via ORAL
  Filled 2013-11-09: qty 2

## 2013-11-09 MED ORDER — FENTANYL CITRATE 0.05 MG/ML IJ SOLN
INTRAMUSCULAR | Status: AC
  Filled 2013-11-09: qty 2

## 2013-11-09 MED ORDER — SODIUM CHLORIDE 0.9 % IV SOLN: INTRAVENOUS | Status: DC

## 2013-11-09 MED ORDER — LIDOCAINE-PRILOCAINE 2.5-2.5 % EX CREA
1.0000 "application " | TOPICAL_CREAM | CUTANEOUS | Status: DC | PRN
  Filled 2013-11-09: qty 5

## 2013-11-09 MED ORDER — METOPROLOL TARTRATE 12.5 MG HALF TABLET
12.5000 mg | ORAL_TABLET | Freq: Two times a day (BID) | ORAL | Status: DC
  Administered 2013-11-09 – 2013-11-10 (×3): 12.5 mg via ORAL
  Filled 2013-11-09 (×4): qty 1

## 2013-11-09 MED ORDER — METOPROLOL SUCCINATE ER 100 MG PO TB24
100.0000 mg | ORAL_TABLET | Freq: Every day | ORAL | Status: DC
  Filled 2013-11-09: qty 1

## 2013-11-09 MED ORDER — ATORVASTATIN CALCIUM 80 MG PO TABS
80.0000 mg | ORAL_TABLET | Freq: Every day | ORAL | Status: DC
Start: 2013-11-09 — End: 2013-11-10
  Administered 2013-11-09: 80 mg via ORAL
  Filled 2013-11-09 (×2): qty 1

## 2013-11-09 MED ORDER — BIVALIRUDIN 250 MG IV SOLR
0.2500 mg/kg/h | INTRAVENOUS | Status: AC
  Filled 2013-11-09: qty 250

## 2013-11-09 MED ORDER — DARBEPOETIN ALFA-POLYSORBATE 60 MCG/0.3ML IJ SOLN
INTRAMUSCULAR | Status: AC
  Administered 2013-11-09: 17:00:00 60 ug via INTRAVENOUS
  Filled 2013-11-09: qty 0.3

## 2013-11-09 MED ORDER — ASPIRIN 81 MG PO CHEW
81.0000 mg | CHEWABLE_TABLET | Freq: Every day | ORAL | Status: DC
  Administered 2013-11-10: 10:00:00 81 mg via ORAL
  Filled 2013-11-09: qty 1

## 2013-11-09 MED ORDER — SODIUM CHLORIDE 0.9 % IJ SOLN: 3.0000 mL | INTRAMUSCULAR | Status: DC | PRN

## 2013-11-09 MED ORDER — TICAGRELOR 90 MG PO TABS
90.0000 mg | ORAL_TABLET | Freq: Two times a day (BID) | ORAL | Status: DC
  Administered 2013-11-09 – 2013-11-10 (×2): 90 mg via ORAL
  Filled 2013-11-09 (×3): qty 1

## 2013-11-09 MED ORDER — MORPHINE SULFATE 10 MG/ML IJ SOLN
INTRAMUSCULAR | Status: AC
Start: 2013-11-09 — End: 2013-11-09
  Filled 2013-11-09: qty 1

## 2013-11-09 MED ORDER — ACETAMINOPHEN 325 MG PO TABS
650.0000 mg | ORAL_TABLET | ORAL | Status: DC | PRN
  Administered 2013-11-09: 650 mg via ORAL

## 2013-11-09 MED ORDER — ASPIRIN 81 MG PO CHEW
81.0000 mg | CHEWABLE_TABLET | ORAL | Status: AC
  Administered 2013-11-09: 81 mg via ORAL
  Filled 2013-11-09: qty 1

## 2013-11-09 MED ORDER — DARBEPOETIN ALFA-POLYSORBATE 60 MCG/0.3ML IJ SOLN
60.0000 ug | INTRAMUSCULAR | Status: DC
  Administered 2013-11-09: 60 ug via INTRAVENOUS
  Filled 2013-11-09: qty 0.3

## 2013-11-09 MED ORDER — NITROGLYCERIN IN D5W 200-5 MCG/ML-% IV SOLN: 5.0000 ug/min | INTRAVENOUS | Status: DC

## 2013-11-09 MED ORDER — PANTOPRAZOLE SODIUM 40 MG PO TBEC
40.0000 mg | DELAYED_RELEASE_TABLET | Freq: Every day | ORAL | Status: DC
  Administered 2013-11-09 – 2013-11-10 (×2): 40 mg via ORAL
  Filled 2013-11-09 (×2): qty 1

## 2013-11-09 MED ORDER — ASPIRIN EC 81 MG PO TBEC: 81.0000 mg | DELAYED_RELEASE_TABLET | Freq: Every day | ORAL | Status: DC

## 2013-11-09 MED ORDER — DIAZEPAM 5 MG PO TABS
5.0000 mg | ORAL_TABLET | ORAL | Status: AC
  Administered 2013-11-09: 5 mg via ORAL
  Filled 2013-11-09: qty 1

## 2013-11-09 MED ORDER — GABAPENTIN 100 MG PO CAPS
100.0000 mg | ORAL_CAPSULE | Freq: Two times a day (BID) | ORAL | Status: DC
  Administered 2013-11-09 – 2013-11-10 (×3): 100 mg via ORAL
  Filled 2013-11-09 (×4): qty 1

## 2013-11-09 MED ORDER — SODIUM CHLORIDE 0.9 % IJ SOLN: 3.0000 mL | Freq: Two times a day (BID) | INTRAMUSCULAR | Status: DC

## 2013-11-09 MED ORDER — SODIUM CHLORIDE 0.9 % IV SOLN: 250.0000 mL | INTRAVENOUS | Status: DC | PRN

## 2013-11-09 MED ORDER — TICAGRELOR 90 MG PO TABS
ORAL_TABLET | ORAL | Status: AC
Start: 2013-11-09 — End: 2013-11-09
  Filled 2013-11-09: qty 2

## 2013-11-09 MED ORDER — LIDOCAINE HCL (PF) 1 % IJ SOLN: 5.0000 mL | INTRAMUSCULAR | Status: DC | PRN

## 2013-11-09 MED ORDER — ALTEPLASE 2 MG IJ SOLR
2.0000 mg | Freq: Once | INTRAMUSCULAR | Status: AC | PRN
  Filled 2013-11-09: qty 2

## 2013-11-09 MED ORDER — BIVALIRUDIN 250 MG IV SOLR
INTRAVENOUS | Status: AC
  Filled 2013-11-09: qty 250

## 2013-11-09 MED ORDER — NEPRO/CARBSTEADY PO LIQD
237.0000 mL | ORAL | Status: DC | PRN
  Filled 2013-11-09: qty 237

## 2013-11-09 MED ORDER — VANCOMYCIN HCL 10 G IV SOLR
1500.0000 mg | Freq: Once | INTRAVENOUS | Status: AC
  Administered 2013-11-09: 13:00:00 1500 mg via INTRAVENOUS
  Filled 2013-11-09: qty 1500

## 2013-11-09 MED ORDER — ONDANSETRON 4 MG PO TBDP
4.0000 mg | ORAL_TABLET | Freq: Three times a day (TID) | ORAL | Status: DC | PRN
  Filled 2013-11-09: qty 1

## 2013-11-09 MED ORDER — HEPARIN SODIUM (PORCINE) 1000 UNIT/ML DIALYSIS
1000.0000 [IU] | INTRAMUSCULAR | Status: DC | PRN
  Filled 2013-11-09: qty 1

## 2013-11-09 MED ORDER — CALCIUM ACETATE 667 MG PO CAPS
2001.0000 mg | ORAL_CAPSULE | Freq: Three times a day (TID) | ORAL | Status: DC
  Administered 2013-11-09 – 2013-11-10 (×2): 2001 mg via ORAL
  Filled 2013-11-09 (×5): qty 3

## 2013-11-09 MED ORDER — LIDOCAINE HCL (PF) 1 % IJ SOLN
INTRAMUSCULAR | Status: AC
  Filled 2013-11-09: qty 30

## 2013-11-09 MED ORDER — NITROGLYCERIN 0.2 MG/ML ON CALL CATH LAB
INTRAVENOUS | Status: AC
  Filled 2013-11-09: qty 1

## 2013-11-09 MED ORDER — PENTAFLUOROPROP-TETRAFLUOROETH EX AERO
1.0000 "application " | INHALATION_SPRAY | CUTANEOUS | Status: DC | PRN
  Filled 2013-11-09: qty 103.5

## 2013-11-09 MED ORDER — MIDAZOLAM HCL 2 MG/2ML IJ SOLN
INTRAMUSCULAR | Status: AC
  Filled 2013-11-09: qty 2

## 2013-11-09 NOTE — H&P (Signed)
Brad Mcintosh is an 79 y.o. male.   Chief Complaint: Exertional dyspnea and chest pain. HPI: 79 year old male with ESRD, Hypertension, Prostate cancer and peripheral vascular disease had recent MI. He has occasional chest pain and shortness of breath.  Past Medical History  Diagnosis Date  . Hypertension   . Hyperlipidemia   . Cancer     prostate  . Gout   . Anemia     Iron deficiency  . Thyroid disease   . Malnutrition   . Atrial fibrillation     paroxysmal  . Chronic kidney disease     Pt has HD on TUESDAY, THURSDAY and SATURDAY  at [facility]  . Peripheral vascular disease       Past Surgical History  Procedure Laterality Date  . Av fistula placement  06/2006    left forearm  . Amputation      right finger  . Incise and drain abcess      chronic complex infection right long finger  . Insertion of dialysis catheter  05/30/2010    right internal jugular Palindrome catheter  . Aortogram  06/01/11    w/ runoff  . Tee without cardioversion N/A 10/25/2013    Procedure: TRANSESOPHAGEAL ECHOCARDIOGRAM (TEE);  Surgeon: [name], MD;  Location: [place];  Service: Cardiovascular;  Laterality: N/A;  . Insertion of dialysis catheter N/A 10/25/2013    Procedure: INSERTION OF DIALYSIS CATHETER;  Surgeon: Elam Dutch, MD;  Location: [place];  Service: Vascular;  Laterality: N/A;  Attempted both right and left neck, procedure unsucessful.    Family History  Problem Relation Age of Onset  . Hypertension Mother   . Asthma Father   . Cancer Sister     breast  . Kidney disease Brother   . Diabetes Brother    Social History:  reports that he quit smoking about 41 years ago. His smoking use included Cigarettes. He smoked 0.50 packs per day. He has never used smokeless tobacco. He reports that he does not drink alcohol or use illicit drugs.  Allergies: No Known Allergies  Medications Prior to Admission  Medication Sig Dispense Refill  . acetaminophen (TYLENOL) 160 MG/5ML  suspension Take 1,000 mg by mouth every 6 (six) hours as needed.      Marland Kitchen aspirin EC 81 MG EC tablet Take 1 tablet (81 mg total) by mouth daily.      . calcium acetate (PHOSLO) 667 MG capsule Take 667 mg by mouth 3 (three) times daily with meals.       . cinacalcet (SENSIPAR) 60 MG tablet Take 60 mg by mouth daily. Takes 1 tablet with evening meal      . citalopram (CELEXA) 20 MG tablet Take 20 mg by mouth at bedtime. Depression      . cyclobenzaprine (FLEXERIL) 10 MG tablet Take one at bed time for back pain and spasms  30 tablet  0  . dexlansoprazole (DEXILANT) 60 MG capsule Take 60 mg by mouth daily.      Marland Kitchen gabapentin (NEURONTIN) 100 MG capsule Take 100 mg by mouth 2 (two) times daily.       Marland Kitchen levofloxacin (LEVAQUIN) 500 MG tablet Take 1 tablet (500 mg total) by mouth every other day.  2 tablet  0  . metoprolol (TOPROL-XL) 100 MG 24 hr tablet Take 100 mg by mouth daily.        . ondansetron (ZOFRAN-ODT) 4 MG disintegrating tablet Take 4 mg by mouth every  8 (eight) hours as needed for nausea or vomiting.      . simvastatin (ZOCOR) 10 MG tablet Take 10 mg by mouth at bedtime.        No results found for this or any previous visit (from the past 48 hour(s)). No results found.  ROS Constitutional: Negative for fever, fatigue, chills, weight loss,  Respiratory: has non productive cough (scant white sputum), no sob, denies hemoptysis, sputum production, wheezing and no stridor.  Cardiovascular: Negative for chest pain, palpitations, orthopnea, claudication, leg swelling and PND.  Gastrointestinal: Reports melena for the past 3 weeks. Today had nausea and vomiting in ED. Possible heart burn (chronic). Negative for abdominal pain, diarrhea, constipation, hematachezia  Genitourinary: Patient Anuric due to ESRD  Musculoskeletal: Negative for myalgias, joint pain and falls.  Skin: Negative for itching and rash.  Neurological:  Negative for tremors, sensory change, focal weakness, seizures, or  headaches.  Blood pressure 147/92, pulse 76, temperature 97.9 F (36.6 C), temperature source Oral, resp. rate 18, height 5' 11.5" (1.816 m), weight 89.359 kg (197 lb), SpO2 96.00%.  General: No acute distress, no diaphoresis.   Neuro: Patient moves all extremities, no focal deficits, lethargic and in/out of sleep during interview  HEENT: [place]/AT, Brown eyes, conjunctiva-pale, PERRL, EOMI, no scleral ecterus Cardiovascular: Irregular rhythm, Normal S 1 and S 2.  Lungs: Clear on exam. Abdomen: Nontender, nondistended. Normal bowel sounds.  Musculoskeletal: No soreness, tenderness, or edema. Left Femoral vein Diatek dialysis catheters for clotted AVGG in left arm Skin: No rashes, sores, or skin breakdown  Assessment/Plan Recent MI CAD ESRD with hemodialysis  PVD  DM, II-diet controlled  HTN  Aortic valve stenosis with regurgitation, mild  Mitral valve stenosis with regurgitation, mild  Tricuspid valve regurgitation  Cardiac cath today.  Nashonda Limberg S 11/09/2013, 6:54 AM

## 2013-11-09 NOTE — CV Procedure (Signed)
PTCA stenting report dictated on 11/09/2013 dictation number is [id number]

## 2013-11-09 NOTE — Consult Note (Signed)
[facility] Renal Consultation Note  Indication for Consultation:  Management of ESRD/hemodialysis; anemia, hypertension/volume and secondary hyperparathyroidism  HPI: Brad Mcintosh is a 79 y.o. male with ESRD on dialysis on TTS at the [facility] who was recently hospitalized 1/10 - 15 for LLL pneumonia, but had elevated cardiac enzymes and anterolateral ischemia on EKG, suggesting NSTEMI, and subsequent TEE showing mostly valvular disease.  He presented today for previously scheduled outpatient cardiac catheterization, which showed multivessel disease requiring PTCA of the LAD by Dr. Doylene Canard, but will require his scheduled dialysis treatment post-procedure.  He is currently stable without chest pain or dyspnea and has no complaints.    Dialysis Orders:  TTS @ [place] 4 hrs     84.5 kg    2K/2Ca       400/800       Heparin 10,000 U      Profile 4     R femoral catheter    Hectorol 8 mcg      Epogen 7200 U       Venofer 0  Past Medical History  Diagnosis Date  . Hypertension   . Hyperlipidemia   . Cancer     prostate  . Gout   . Anemia     Iron deficiency  . Thyroid disease   . Malnutrition   . Atrial fibrillation     paroxysmal  . Chronic kidney disease     Pt has HD on TUESDAY, THURSDAY and SATURDAY  at [facility]  . Peripheral vascular disease   . Coronary artery disease   . Shortness of breath    Past Surgical History  Procedure Laterality Date  . Av fistula placement  06/2006    left forearm  . Amputation      right finger  . Incise and drain abcess      chronic complex infection right long finger  . Insertion of dialysis catheter  05/30/2010    right internal jugular Palindrome catheter  . Aortogram  06/01/11    w/ runoff  . Tee without cardioversion N/A 10/25/2013    Procedure: TRANSESOPHAGEAL ECHOCARDIOGRAM (TEE);  Surgeon: [name], MD;  Location: [place];  Service: Cardiovascular;  Laterality: N/A;  . Insertion of dialysis  catheter N/A 10/25/2013    Procedure: INSERTION OF DIALYSIS CATHETER;  Surgeon: Elam Dutch, MD;  Location: [place];  Service: Vascular;  Laterality: N/A;  Attempted both right and left neck, procedure unsucessful.  . Cardiac catheterization    . Coronary angioplasty     Family History  Problem Relation Age of Onset  . Hypertension Mother   . Asthma Father   . Cancer Sister     breast  . Kidney disease Brother   . Diabetes Brother    Social History He quit smoking cigarettes around 40 years ago, occasionally drinks alcohol, and denies any history of illicit drugs.  No Known Allergies Prior to Admission medications   Medication Sig Start Date End Date Taking? Authorizing Provider  acetaminophen (TYLENOL) 160 MG/5ML suspension Take 1,000 mg by mouth every 6 (six) hours as needed.   Yes Historical Provider, MD  aspirin EC 81 MG EC tablet Take 1 tablet (81 mg total) by mouth daily. 10/26/13  Yes Shanker Kristeen Mans, MD  calcium acetate (PHOSLO) 667 MG capsule Take 667 mg by mouth 3 (three) times daily with meals.  06/20/12  Yes Historical Provider, MD  cinacalcet (SENSIPAR) 60 MG tablet Take 60 mg  by mouth daily. Takes 1 tablet with evening meal   Yes Historical Provider, MD  citalopram (CELEXA) 20 MG tablet Take 20 mg by mouth at bedtime. Depression   Yes Historical Provider, MD  cyclobenzaprine (FLEXERIL) 10 MG tablet Take one at bed time for back pain and spasms 06/02/13  Yes Orma Flaming, MD  dexlansoprazole (DEXILANT) 60 MG capsule Take 60 mg by mouth daily.   Yes Historical Provider, MD  gabapentin (NEURONTIN) 100 MG capsule Take 100 mg by mouth 2 (two) times daily.  06/16/11  Yes Historical Provider, MD  levofloxacin (LEVAQUIN) 500 MG tablet Take 1 tablet (500 mg total) by mouth every other day. 10/26/13  Yes Shanker Kristeen Mans, MD  metoprolol (TOPROL-XL) 100 MG 24 hr tablet Take 100 mg by mouth daily.     Yes Historical Provider, MD  ondansetron (ZOFRAN-ODT) 4 MG disintegrating tablet Take  4 mg by mouth every 8 (eight) hours as needed for nausea or vomiting.   Yes Historical Provider, MD  simvastatin (ZOCOR) 10 MG tablet Take 10 mg by mouth at bedtime.   Yes Historical Provider, MD   Labs:  Results for orders placed during the hospital encounter of 11/09/13 (from the past 48 hour(s))  POCT I-STAT, CHEM 8     Status: Abnormal   Collection Time    11/09/13  8:19 AM      Result Value Range   Sodium 136 (*) 137 - 147 mEq/L   Potassium 4.7  3.7 - 5.3 mEq/L   Chloride 100  96 - 112 mEq/L   BUN 53 (*) 6 - 23 mg/dL   Creatinine, Ser 10.70 (*) 0.50 - 1.35 mg/dL   Glucose, Bld 81  70 - 99 mg/dL   Calcium, Ion 1.27  1.13 - 1.30 mmol/L   TCO2 24  0 - 100 mmol/L   Hemoglobin 10.5 (*) 13.0 - 17.0 g/dL   HCT 31.0 (*) 39.0 - 52.0 %  GLUCOSE, CAPILLARY     Status: Abnormal   Collection Time    11/09/13  1:05 PM      Result Value Range   Glucose-Capillary 65 (*) 70 - 99 mg/dL   Comment 1 Notify RN     Constitutional: negative for chills, fatigue, fevers and sweats Ears, nose, mouth, throat, and face: negative for earaches, hoarseness, nasal congestion and sore throat Respiratory: negative for cough, dyspnea on exertion, hemoptysis and sputum Cardiovascular: negative for chest pain, chest pressure/discomfort, dyspnea, orthopnea and palpitations Gastrointestinal: negative for abdominal pain, change in bowel habits, nausea and vomiting Genitourinary:negative, anuric Musculoskeletal:negative for arthralgias, back pain, myalgias and neck pain Neurological: negative for dizziness, gait problems, headaches, paresthesia and weakness  Physical Exam: Filed Vitals:   11/09/13 1150  BP: 158/65  Pulse: 50  Temp: 97.4 F (36.3 C)  Resp: 18     General appearance: alert, cooperative and no distress Head: Normocephalic, without obvious abnormality, atraumatic Neck: no adenopathy, no carotid bruit, no JVD and supple, symmetrical, trachea midline Resp: clear to auscultation  bilaterally Cardio: RRR with Gr II/VI systolic murmur, no rub GI: soft, non-tender; bowel sounds normal; no masses,  no organomegaly Extremities: extremities normal, atraumatic, no cyanosis or edema Neurologic: Grossly normal Dialysis Access: L femoral catheter   Assessment/Plan: 1. Hx NSTEMI - elevated troponins & anteriolateral ischemia per EKG during last hospitalization 1/10-15; cardiac cath today with PCTA of LAD per Dr. Doylene Canard. Occluded RCA with sig disease of LAD and LCx, s/p procedure by Dr Terrence Dupont yest 2. ESRD -  HD on TTS @ [place]; K 4.7.  HD pending. 3. Hypertension/volume - 158/65 on Metoprolol 12.5 mg bid, wt 89.4 kg with EDW 84.5, but last reached goal on 1/17.  Attempt 4 L today. 4. Anemia - Hgb 9.9 on outpatient Epogen.  Aranesp 100 mcg today. 5. Metabolic bone disease - last Ca 9.7, P 9.6, iPTH 616; Hectorol 8 mcg. Sensipar 6 mg qd, Phoslo 3 with meals. 6. Nutrition - Last Alb 3.9, renal diet & vitamin.  Mcintosh,Brad 11/09/2013, 1:07 PM   Attending Nephrologist: Roney Jaffe, MD  I have seen and examined patient, discussed with PA and agree with assessment and plan as outlined above with additions as indicated.  ESRD patient with recent MI and EKG changes underwent heart cath today with significant disease in 3 vessels.  Details of stenting procedure are pending. Volume status is stable, plan HD today.  Kelly Splinter MD pager [phone]    cell [phone] 11/09/2013, 1:30 PM

## 2013-11-09 NOTE — CV Procedure (Signed)
PROCEDURE:  Left heart catheterization with selective coronary angiography, left ventriculogram.  CLINICAL HISTORY:  This is a 79 year old male with recent MI has exertional dyspnea and chest pain.  The risks, benefits, and details of the procedure were explained to the patient.  The patient verbalized understanding and wanted to proceed.  Informed written consent was obtained.  PROCEDURE TECHNIQUE:  The patient was approached from the right femoral artery using a 5 French short sheath.  Left coronary angiography was done using a Judkins L4 guide catheter.  Right coronary angiography was done using a Judkins R4 guide catheter.  Left ventriculography was done using a pigtail catheter.    CONTRAST:  Total of 55 cc.  COMPLICATIONS:  None.  At the end of the procedure a manual ptressure was used for hemostasis.    HEMODYNAMICS:  Aortic pressure was 138/67.    ANGIOGRAM/CORONARY ARTERIOGRAM:   The left main coronary artery is calcific and without significant disease.  The left anterior descending artery has proximal eccentric 70-80 % stenosis. Luminal irregularities of Diagonal vessel.  The left circumflex artery has proximal 80 % stenosis. Small OM.  The right coronary artery is totally occluded. Distal RCA fills faintly via collateral vessels from LCX.  LEFT VENTRICULOGRAM:  Left ventricular angiogram was not done. Echocardiogram 2 weeks ago showed antero-septal wall hypokinesia and mild systolic dysfunction with an estimated ejection fraction of 45-50 %.    IMPRESSION OF HEART CATHETERIZATION:   1. Calcific left main coronary artery. 2. Severe disease of left anterior descending artery and minimal disease of its branches. 3. Severe disease of left circumflex artery and its branches. 4. Total occlusion of right coronary artery. 5. Mild left ventricular systolic dysfunction.  Ejection fraction 45 % by Echocardiogram.  RECOMMENDATION:   Possible PTCA of LAD +/- LCX by Dr. Terrence Dupont.Marland Kitchen

## 2013-11-09 NOTE — Progress Notes (Signed)
ANTIBIOTIC CONSULT NOTE - INITIAL  Pharmacy Consult for Vancomycin  Indication: Post PCI  No Known Allergies  Patient Measurements: Height: 5' 11.5" (181.6 cm) Weight: 197 lb (89.359 kg) IBW/kg (Calculated) : 76.45   Vital Signs: Temp: 97.9 F (36.6 C) (01/29 0615) Temp src: Oral (01/29 0615) BP: 147/92 mmHg (01/29 0615) Pulse Rate: 69 (01/29 0756) Intake/Output from previous day:   Intake/Output from this shift:    Labs:  Recent Labs  11/09/13 0819  HGB 10.5*  CREATININE 10.70*   Estimated Creatinine Clearance: 6.4 ml/min (by C-G formula based on Cr of 10.7). No results found for this basename: VANCOTROUGH, Corlis Leak, VANCORANDOM, GENTTROUGH, GENTPEAK, GENTRANDOM, [place], TOBRAPEAK, TOBRARND, AMIKACINPEAK, AMIKACINTROU, AMIKACIN,  in the last 72 hours   Microbiology: Recent Results (from the past 720 hour(s))  CULTURE, BLOOD (ROUTINE X 2)     Status: None   Collection Time    10/21/13  4:50 PM      Result Value Range Status   Specimen Description BLOOD RIGHT FOREARM   Final   Special Requests BOTTLES DRAWN AEROBIC AND ANAEROBIC 5CC   Final   Culture  Setup Time     Final   Value: 10/21/2013 22:05     Performed at [facility]   Culture     Final   Value: NO GROWTH 5 DAYS     Performed at [facility]   Report Status 10/27/2013 FINAL   Final  CULTURE, BLOOD (ROUTINE X 2)     Status: None   Collection Time    10/21/13  5:05 PM      Result Value Range Status   Specimen Description BLOOD RIGHT WRIST   Final   Special Requests BOTTLES DRAWN AEROBIC AND ANAEROBIC 5CC   Final   Culture  Setup Time     Final   Value: 10/21/2013 22:04     Performed at [facility]   Culture     Final   Value: NO GROWTH 5 DAYS     Performed at [facility]   Report Status 10/27/2013 FINAL   Final  MRSA PCR SCREENING     Status: None   Collection Time    10/21/13 10:14 PM      Result Value Range Status   MRSA by PCR NEGATIVE  NEGATIVE Final    Comment:            The GeneXpert MRSA Assay (FDA     approved for NASAL specimens     only), is one component of a     comprehensive MRSA colonization     surveillance program. It is not     intended to diagnose MRSA     infection nor to guide or     monitor treatment for     MRSA infections.  RESPIRATORY VIRUS PANEL     Status: None   Collection Time    10/21/13 11:04 PM      Result Value Range Status   Source - RVPAN NASAL SWAB   Corrected   Comment: CORRECTED ON 01/12 AT 0138: PREVIOUSLY REPORTED AS NASAL SWAB   Respiratory Syncytial Virus A NOT DETECTED   Final   Respiratory Syncytial Virus B NOT DETECTED   Final   Influenza A NOT DETECTED   Final   Influenza B NOT DETECTED   Final   Parainfluenza 1 NOT DETECTED   Final   Parainfluenza 2 NOT DETECTED   Final   Parainfluenza 3 NOT DETECTED  Final   Metapneumovirus NOT DETECTED   Final   Rhinovirus NOT DETECTED   Final   Adenovirus NOT DETECTED   Final   Influenza A H1 NOT DETECTED   Final   Influenza A H3 NOT DETECTED   Final   Comment: (NOTE)           Normal Reference Range for each Analyte: NOT DETECTED     Testing performed using the Luminex xTAG Respiratory Viral Panel test     kit.     This test was developed and its performance characteristics determined     by [facility]. It has not been cleared or approved by the [place]     Food and Drug Administration. This test is used for clinical purposes.     It should not be regarded as investigational or for research. This     laboratory is certified under the [place] (CLIA) as qualified to perform high complexity     clinical laboratory testing.     Performed at [address], EXPECTORATED SPUTUM-ASSESSMENT     Status: None   Collection Time    10/22/13  6:26 AM      Result Value Range Status   Specimen Description SPUTUM   Final   Special Requests Normal   Final   Sputum evaluation     Final    Value: THIS SPECIMEN IS ACCEPTABLE. RESPIRATORY CULTURE REPORT TO FOLLOW.   Report Status 10/22/2013 FINAL   Final  CULTURE, RESPIRATORY (NON-EXPECTORATED)     Status: None   Collection Time    10/22/13  6:26 AM      Result Value Range Status   Specimen Description SPUTUM   Final   Special Requests NONE   Final   Gram Stain     Final   Value: FEW WBC PRESENT, PREDOMINANTLY PMN     NO SQUAMOUS EPITHELIAL CELLS SEEN     FEW GRAM NEGATIVE RODS     RARE GRAM POSITIVE COCCI     IN PAIRS RARE GRAM POSITIVE RODS   Culture     Final   Value: NORMAL OROPHARYNGEAL FLORA     Performed at [facility]   Report Status 10/24/2013 FINAL   Final  SURGICAL PCR SCREEN     Status: None   Collection Time    10/25/13  2:54 PM      Result Value Range Status   MRSA, PCR NEGATIVE  NEGATIVE Final   Staphylococcus aureus NEGATIVE  NEGATIVE Final   Comment:            The Xpert SA Assay (FDA     approved for NASAL specimens     in patients over 30 years of age),     is one component of     a comprehensive surveillance     program.  Test performance has     been validated by [employer] for patients greater     than or equal to 94 year old.     It is not intended     to diagnose infection nor to     guide or monitor treatment.    Medical History: Past Medical History  Diagnosis Date  . Hypertension   . Hyperlipidemia   . Cancer     prostate  . Gout   . Anemia     Iron deficiency  . Thyroid disease   .  Malnutrition   . Atrial fibrillation     paroxysmal  . Chronic kidney disease     Pt has HD on TUESDAY, THURSDAY and SATURDAY  at [facility]  . Peripheral vascular disease     Medications:  Prescriptions prior to admission  Medication Sig Dispense Refill  . acetaminophen (TYLENOL) 160 MG/5ML suspension Take 1,000 mg by mouth every 6 (six) hours as needed.      Marland Kitchen aspirin EC 81 MG EC tablet Take 1 tablet (81 mg total) by mouth daily.      . calcium acetate (PHOSLO) 667 MG  capsule Take 667 mg by mouth 3 (three) times daily with meals.       . cinacalcet (SENSIPAR) 60 MG tablet Take 60 mg by mouth daily. Takes 1 tablet with evening meal      . citalopram (CELEXA) 20 MG tablet Take 20 mg by mouth at bedtime. Depression      . cyclobenzaprine (FLEXERIL) 10 MG tablet Take one at bed time for back pain and spasms  30 tablet  0  . dexlansoprazole (DEXILANT) 60 MG capsule Take 60 mg by mouth daily.      Marland Kitchen gabapentin (NEURONTIN) 100 MG capsule Take 100 mg by mouth 2 (two) times daily.       Marland Kitchen levofloxacin (LEVAQUIN) 500 MG tablet Take 1 tablet (500 mg total) by mouth every other day.  2 tablet  0  . metoprolol (TOPROL-XL) 100 MG 24 hr tablet Take 100 mg by mouth daily.        . ondansetron (ZOFRAN-ODT) 4 MG disintegrating tablet Take 4 mg by mouth every 8 (eight) hours as needed for nausea or vomiting.      . simvastatin (ZOCOR) 10 MG tablet Take 10 mg by mouth at bedtime.       Scheduled:  . [START ON 11/10/2013] aspirin  81 mg Oral Daily  . atorvastatin  80 mg Oral q1800  . calcium acetate  667 mg Oral TID WC  . citalopram  20 mg Oral QHS  . gabapentin  100 mg Oral BID  . metoprolol tartrate  12.5 mg Oral BID  . pantoprazole  40 mg Oral Daily  . Ticagrelor  90 mg Oral BID  . vancomycin  1,500 mg Intravenous Once   Assessment: 79 yo male with ESRD s/p PTCA stenting of the LAD by Dr. Terrence Dupont today. Afebrile. No new cultures this admit.  WBC lab in process.  Goal of Therapy:  Post PCI dosing x 1 dose;  Target vancomycin trough 10-15 mcg/ml  (or 15-25 mcg/ml predialysis level)  Plan:  Vancomycin 1500 mg IV x 1 dose post PCI.  Please notify pharmacy if vancomycin to continue for more than 1 dose post PCI.   Nicole Cella, RPh Clinical Pharmacist Pager: [phone] 11/09/2013,11:49 AM

## 2013-11-09 NOTE — Interval H&P Note (Signed)
History and Physical Interval Note:  11/09/2013 7:08 AM  Brad Mcintosh  has presented today for surgery, with the diagnosis of mi  The various methods of treatment have been discussed with the patient and family. After consideration of risks, benefits and other options for treatment, the patient has consented to  Procedure(s): LEFT HEART CATHETERIZATION WITH CORONARY ANGIOGRAM (N/A) as a surgical intervention .  The patient's history has been reviewed, patient examined, no change in status, stable for surgery.  I have reviewed the patient's chart and labs.  Questions were answered to the patient's satisfaction.     Hannalee Castor S

## 2013-11-09 NOTE — Progress Notes (Signed)
Subjective:  Patient denies any chest pain or shortness of breath. Complains of back pain from lying down since morning. Right groin appears to be stable with no evidence of hematoma dressing is dry. No ecchymosis or fullness in the lumbar region. Tolerating hemodialysis well. Remains hemodynamically stable. Tolerated PCI to LAD and left circumflex without any complications  Objective:  Vital Signs in the last 24 hours: Temp:  [97.4 F (36.3 C)-97.9 F (36.6 C)] 97.4 F (36.3 C) (01/29 1150) Pulse Rate:  [50-83] 56 (01/29 1600) Resp:  [14-18] 18 (01/29 1600) BP: (122-162)/(65-96) 122/67 mmHg (01/29 1600) SpO2:  [96 %-99 %] 99 % (01/29 1414) Weight:  [89.359 kg (197 lb)] 89.359 kg (197 lb) (01/29 0615)  Intake/Output from previous day:   Intake/Output from this shift:    Physical Exam: Neck: no adenopathy, no carotid bruit, no JVD and supple, symmetrical, trachea midline Lungs: clear to auscultation bilaterally Heart: regular rate and rhythm, S1, S2 normal and 2/6 systolic murmur noted Abdomen: soft, non-tender; bowel sounds normal; no masses,  no organomegaly Extremities: extremities normal, atraumatic, no cyanosis or edema and Right groin dressing dry no evidence of hematoma  Lab Results:  Recent Labs  11/09/13 0819 11/09/13 1315  WBC  --  4.4  HGB 10.5* 9.9*  PLT  --  169    Recent Labs  11/09/13 0819 11/09/13 1315  NA 136* 137  K 4.7 5.4*  CL 100 95*  CO2  --  21  GLUCOSE 81 68*  BUN 53* 59*  CREATININE 10.70* 10.83*   No results found for this basename: TROPONINI, CK, MB,  in the last 72 hours Hepatic Function Panel  Recent Labs  11/09/13 1500  ALBUMIN 2.8*   No results found for this basename: CHOL,  in the last 72 hours No results found for this basename: PROTIME,  in the last 72 hours  Imaging: Imaging results have been reviewed and No results found.  Cardiac Studies:  Assessment/Plan:  Unstable angina status post left cath/PTCA stenting to  ostial and proximal LAD and ostial and proximal left circumflex Status post recent non-Q-wave myocardial infarction approximately 2 weeks ago History of silent inferior wall MI in the past Hypertension Diabetes mellitus Valvular heart disease End-stage renal disease on hemodialysis Anemia of chronic disease History of paroxysmal atrial fibrillation in the past Peripheral artery disease Gouty arthritis Degenerative joint disease Plan Continue present management I will sign off please call if needed  LOS: 0 days    Brad Mcintosh N 11/09/2013, 5:12 PM

## 2013-11-09 NOTE — Progress Notes (Signed)
Site area: right groin  Site Prior to Removal:  Level 0  Pressure Applied For 20 MINUTES    Minutes Beginning at 20:10  Manual:   yes  Patient Status During Pull:  AXOX3  Post Pull Groin Site:  Level 0  Post Pull Instructions Given:  yes  Post Pull Pulses Present:  yes  Dressing Applied:  yes  Comments:  Pt tolerated sheath removal without complication, VSS as charted, will continue to monitor patient.

## 2013-11-10 LAB — CBC
HCT: 30.8 % — ABNORMAL LOW (ref 39.0–52.0)
Hemoglobin: 9.9 g/dL — ABNORMAL LOW (ref 13.0–17.0)
MCH: 27.6 pg (ref 26.0–34.0)
MCHC: 32.1 g/dL (ref 30.0–36.0)
MCV: 85.8 fL (ref 78.0–100.0)
PLATELETS: 197 10*3/uL (ref 150–400)
RBC: 3.59 MIL/uL — AB (ref 4.22–5.81)
RDW: 18.7 % — AB (ref 11.5–15.5)
WBC: 6.5 10*3/uL (ref 4.0–10.5)

## 2013-11-10 LAB — BASIC METABOLIC PANEL
BUN: 30 mg/dL — ABNORMAL HIGH (ref 6–23)
CO2: 25 meq/L (ref 19–32)
CREATININE: 6.79 mg/dL — AB (ref 0.50–1.35)
Calcium: 9.7 mg/dL (ref 8.4–10.5)
Chloride: 93 mEq/L — ABNORMAL LOW (ref 96–112)
GFR calc Af Amer: 8 mL/min — ABNORMAL LOW (ref >90)
GFR, EST NON AFRICAN AMERICAN: 7 mL/min — AB (ref >90)
Glucose, Bld: 96 mg/dL (ref 70–99)
Potassium: 4.4 mEq/L (ref 3.7–5.3)
Sodium: 137 mEq/L (ref 137–147)

## 2013-11-10 LAB — GLUCOSE, CAPILLARY: Glucose-Capillary: 110 mg/dL — ABNORMAL HIGH (ref 70–99)

## 2013-11-10 MED ORDER — ACETAMINOPHEN 500 MG PO TABS
500.0000 mg | ORAL_TABLET | Freq: Four times a day (QID) | ORAL | Status: DC | PRN
Start: 2013-11-10 — End: 2014-02-18

## 2013-11-10 MED ORDER — CLONIDINE HCL 0.3 MG PO TABS: 0.3000 mg | ORAL_TABLET | Freq: Every day | ORAL | Status: AC

## 2013-11-10 MED ORDER — METOPROLOL TARTRATE 12.5 MG HALF TABLET: 12.5000 mg | ORAL_TABLET | Freq: Two times a day (BID) | ORAL | Status: DC

## 2013-11-10 MED ORDER — TICAGRELOR 90 MG PO TABS: 90.0000 mg | ORAL_TABLET | Freq: Two times a day (BID) | ORAL | Status: AC

## 2013-11-10 MED FILL — Sodium Chloride IV Soln 0.9%: INTRAVENOUS | Qty: 50 | Status: AC

## 2013-11-10 NOTE — Progress Notes (Signed)
CARDIAC REHAB PHASE I   PRE:  Rate/Rhythm: 101 ST  BP:  Supine:   Sitting: 108/75  Standing:    SaO2: 95% RA  MODE:  Ambulation: 400 ft   POST:  Rate/Rhythm: 113 ST  BP:  Supine:   Sitting: 124/74  Standing:    SaO2: 92 RA  Patient tolerated ambulation well without angina or difficulty.  Education completed regarding activity restrictions, activity progression, angina symptoms, NTG usage, heart healthy nutrition tips, and phase II cardiac rehab.  Patient will discuss with Dr. Doylene Canard re: outpatient cardiac rehab when he has follow-up appointment.   [phone]  Liliane Channel RN, BSN 11/10/2013 9:52 AM

## 2013-11-10 NOTE — Cardiovascular Report (Signed)
Brad Mcintosh, Brad Mcintosh NO.:  [id number]  MEDICAL RECORD NO.:  [id number]  LOCATION:  6C01C                        FACILITY:  [facility]  PHYSICIAN:  Allegra Lai. Terrence Dupont, M.D. DATE OF BIRTH:  [birth year]  DATE OF PROCEDURE:  11/09/2013 DATE OF DISCHARGE:                           CARDIAC CATHETERIZATION   PROCEDURE: 1. Successful percutaneous transluminal coronary angioplasty to ostial     left anterior descending using 2.5 x 12-mm long Trek balloon and     then 3.0 x 15 mm long [place] Emerge balloon going up to 15 atmospheric     pressure. 2. Successful deployment of 3.5 x 28 mm long Xience Alpine drug-     eluting stent in the ostial and proximal left anterior descending. 3. Successful postdilatation of this stent using 3.5 x 15 mm long [place]     Trek balloon. 4. Successful percutaneous transluminal coronary angioplasty to     proximal and ostial left circumflex using 2.0 x 12 mm long [place]     Emerge balloon. 5. Successful deployment of 2.5 x 15 mm long Xience Alpine drug-     eluting stent in proximal and ostial left circumflex.  Successful     dilatation of the stent using same balloon going up to 13     atmospheric pressure.  INDICATION FOR THE PROCEDURE:  Brad Mcintosh is a 79 year old male with past medical history significant for multiple medical problems, i.e., coronary artery disease, history of recent non-Q-wave myocardial infarction, treated medically, hypertension, diabetes mellitus, hypercholesteremia, peripheral artery disease, valvular heart disease, end-stage renal disease, on hemodialysis, anemia of chronic disease, tobacco abuse, gouty arthritis, history of paroxysmal atrial fibrillation, was admitted by Dr. Doylene Canard because of retrosternal chest pain and shortness of breath.  The patient had non-Q-wave MI approximately 2 weeks ago and was treated medically.  Due to recurrent chest pain and shortness of breath, the patient underwent left cardiac catheterization  by Dr. Doylene Canard earlier today, which showed critical ostial LAD and left circumflex stenosis with RCA 100% occluded at the ostium filling by collaterals from the left system.  I was called for PCI to LAD and possibly left circumflex.  INTERVENTIONAL PROCEDURE NOTE:  After obtaining the informed consent, a 6-French VL4 guiding catheter was advanced over the wire under fluoroscopic guidance up to the ascending aorta.  Wire was pulled out, the catheter was aspirated and connected to the Manifold.  Catheter was further advanced and engaged into left coronary ostium.  Multiple views of the left system were taken.  FINDINGS:  Left main had 10% to 15% distal stenosis.  LAD has 90% to 95% ostial stenosis with filling defect.  Ramus was moderate sizes, which has mild disease.  Left circumflex has ostial and proximal 85% to 90% stenosis.  RCA was 100% occluded chronically, which was filling by collaterals from the left system.  INTERVENTIONAL PROCEDURE:  Successful PTCA to ostial and proximal LAD was done using 2.5 x 12-mm long Trek balloon and then 3.0 x 15 mm long [place] Emerge balloon for predilatation and then 3.5 x 28 mm long Xience Alpine drug-eluting stent was deployed in proximal and ostial LAD at 10 atmospheric pressure.  The stent was post dilated using 3.5 x 15 mm long [place] Trek balloon going up to 20 atmospheric pressure.  Lesion dilated from 90% to 95% to less than 10% residual with excellent TIMI grade 3 distal flow without evidence of dissection or distal embolization.  Successful PTCA to ostial and proximal left circumflex was done using 2.0 x 12 mm long [place] Emerge balloon for predilatation and then 2.5 x 15 mm long Xience Alpine drug-eluting stent was deployed at 10 atmospheric pressure.  The stent was fully expanded using same balloon going up to 13 atmospheric pressure.  Lesion dilated from 85% to 90% to 0% residual with excellent TIMI grade 3 distal flow without evidence of  dissection or distal embolization.  The patient received weight-based Angiomax and 180 mg of Brilinta prior to the procedure.  The patient tolerated the procedure well.  There were no complications.  The patient was transferred to recovery room in stable condition.     Allegra Lai. Terrence Dupont, M.D.     MNH/MEDQ  D:  11/09/2013  T:  11/10/2013  Job:  [id number]  cc:   [name], M.D.

## 2013-11-10 NOTE — Discharge Summary (Signed)
Physician Discharge Summary  Patient ID: Brad Mcintosh MRN: [id number] DOB/AGE: March 16, 1935 79 y.o.  Admit date: 11/09/2013 Discharge date: 11/10/2013  Admission Diagnoses: Recent MI  CAD  ESRD with hemodialysis  PVD  DM, II-diet controlled  HTN  Aortic valve stenosis with regurgitation, mild  Mitral valve stenosis with regurgitation, mild  Tricuspid valve regurgitation  Discharge Diagnoses:  Principle Problem: * Multivessel, native vessel CAD (coronary artery disease) * Recent MI, subendocardial  S/P Stent in Left anterior descending and Left Circumflex arteries of heart ESRD with hemodialysis  Peripheral vascular disease  DM, II-diet controlled  Hypertension  Aortic valve stenosis with regurgitation, mild  Mitral valve stenosis with regurgitation, mild  Tricuspid valve regurgitation Anemia of chronic renal disease  Discharged Condition: good  Hospital Course: 79 year old male with ESRD, Hypertension, Prostate cancer and peripheral vascular disease had recent MI. He has occasional chest pain and shortness of breath. He underwent cardiac cath showing severe disease of LAD and LCX and total chronic occlusion of RCA. He had stent placement in Proximal LAD and LCX by Dr. Terrence Dupont with good result. He was discharged home next day post ambulation. His B-blocker and clonidine doses were decreased due to aortic stenosis and significant 1st. Degree AV block.   Consults: cardiology  Significant Diagnostic Studies: labs: Normal WBC count, Low Hgb of 9 to 10. Near normal electrolytes with BUN of 50's and creatinine of 6.79 to 10.83.  EKG-SR with 1st degree AV block and non-specific ST-T changes.  Cardiac cath showed total chronic occlusion of RCA and severe disease of proximal LAD and LCX treated with drug eluting stent.  Treatments: cardiac meds: metoprolol, Aspirin, Brilinta and Simvastatin.  Discharge Exam: Blood pressure 107/63, pulse 87, temperature 98.7 F (37.1 C), temperature  source Oral, resp. rate 20, height 5' 11.5" (1.816 m), weight 84.5 kg (186 lb 4.6 oz), SpO2 99.00%. General: No acute distress, no diaphoresis. Well built and nourished. HEENT: [place]/AT, Brown eyes, conjunctiva-pale, PERRL, EOMI, no scleral icterus.  Cardiovascular: Irregular rhythm, Normal S 1 and S 2. III/VI systolic and II/VI diastolic murmur. Lungs: Clear on exam.  Abdomen: Nontender, nondistended. Normal bowel sounds.  Musculoskeletal: No soreness, tenderness, or edema. Left Femoral vein Diatek dialysis catheters for clotted AVGG in left arm. Neuro: Patient moves all extremities, no focal deficits.  Skin: No rashes, sores, or skin breakdown   Disposition: 01-Home or Self Care   Future Appointments Provider Department Dept Phone   11/20/2013 12:00 PM Serafina Mitchell, MD [facility] [phone]   08/22/2014 11:00 AM [facility] [address] [phone]   08/22/2014 11:40 AM Sharmon Leyden Nickel, NP [facility] [phone]       Medication List    STOP taking these medications       furosemide 80 MG tablet  Commonly known as:  LASIX     levofloxacin 500 MG tablet  Commonly known as:  LEVAQUIN      TAKE these medications       acetaminophen 500 MG tablet  Commonly known as:  TYLENOL  Take 1 tablet (500 mg total) by mouth every 6 (six) hours as needed for moderate pain.     aspirin 81 MG EC tablet  Take 1 tablet (81 mg total) by mouth daily.     calcium acetate 667 MG capsule  Commonly known as:  PHOSLO  Take 667 mg by mouth 3 (three) times daily with meals.  citalopram 20 MG tablet  Commonly known as:  CELEXA  Take 20 mg by mouth at bedtime. Depression     cloNIDine 0.3 MG tablet  Commonly known as:  CATAPRES  Take 0.3 mg by mouth 3 (three) times daily.     colchicine 0.6 MG tablet  Take 0.6 mg by mouth daily.     cyclobenzaprine 10 MG tablet  Commonly known as:  FLEXERIL   Take one at bed time for back pain and spasms     gabapentin 300 MG capsule  Commonly known as:  NEURONTIN  Take 300 mg by mouth at bedtime.     HYDROcodone-acetaminophen 5-325 MG per tablet  Commonly known as:  NORCO/VICODIN  Take 1 tablet by mouth daily.     metoprolol succinate 100 MG 24 hr tablet  Commonly known as:  TOPROL-XL  Take 100 mg by mouth daily.     multivitamin Tabs tablet  Take 1 tablet by mouth daily.     ondansetron 4 MG disintegrating tablet  Commonly known as:  ZOFRAN-ODT  Take 4 mg by mouth every 8 (eight) hours as needed for nausea or vomiting.     pantoprazole 40 MG tablet  Commonly known as:  PROTONIX  Take 40 mg by mouth daily.     simvastatin 10 MG tablet  Commonly known as:  ZOCOR  Take 10 mg by mouth at bedtime.     Ticagrelor 90 MG Tabs tablet  Commonly known as:  BRILINTA  Take 1 tablet (90 mg total) by mouth 2 (two) times daily.           Follow-up Information   Follow [facility] with [facility], MD. Schedule an appointment as soon as possible for a visit in 2 weeks.   Specialty:  Cardiology   Contact information:   [place] [address] [phone]       Signed: [name] 11/10/2013, 9:15 AM

## 2013-11-10 NOTE — Care Management Note (Signed)
    Page 1 of 1   11/10/2013     10:56:30 AM   CARE MANAGEMENT NOTE 11/10/2013  Patient:  Brad Mcintosh, Brad Mcintosh   Account Number:  [id number]  Date Initiated:  11/10/2013  Documentation initiated by:  [facility]  Subjective/Objective Assessment:   79 year old male with ESRD, Hypertension, Prostate cancer and peripheral vascular disease had recent MI. He has occasional chest pain and shortness of breath.//Home with children     Action/Plan:   PROCEDURE:  Left heart catheterization with selective coronary angiography, left ventriculogram.//Home with self care; benefits check for Brilinta   Anticipated DC Date:  11/10/2013   Anticipated DC Plan:  HOME/SELF CARE         Choice offered to / List presented to:             Status of service:   Medicare Important Message given?   (If response is "NO", the following Medicare IM given date fields will be blank) Date Medicare IM given:   Date Additional Medicare IM given:    Discharge Disposition:    Per UR Regulation:    If discussed at Long Length of Stay Meetings, dates discussed:    Comments:  11/10/13 [place], RN, BSN, [employer] [phone] Spoke with pt in [place] regarding benefits check for Brilinta.  Pt has brochure with 30 day free card and refill assistance card intact.  Pt utilizes [facility] on [address] for prescription needs.  NCM called pharmacy to confirm availability of medication.  Information relayed to pt.  Pt verbalizes importance of filling medication upon discharge.

## 2013-11-17 ENCOUNTER — Encounter: Payer: Self-pay | Admitting: Surgery

## 2013-11-20 ENCOUNTER — Other Ambulatory Visit: Payer: Self-pay

## 2013-11-20 ENCOUNTER — Ambulatory Visit (INDEPENDENT_AMBULATORY_CARE_PROVIDER_SITE_OTHER): Payer: Medicare Other | Admitting: Surgery

## 2013-11-20 ENCOUNTER — Encounter: Payer: Self-pay | Admitting: Surgery

## 2013-11-20 VITALS — BP 149/89 | HR 85 | Ht 71.5 in | Wt 188.6 lb

## 2013-11-20 DIAGNOSIS — T82598A Other mechanical complication of other cardiac and vascular devices and implants, initial encounter: Secondary | ICD-10-CM

## 2013-11-20 DIAGNOSIS — N186 End stage renal disease: Secondary | ICD-10-CM

## 2013-11-20 NOTE — Progress Notes (Signed)
Patient name: Brad Mcintosh MRN: [id number] DOB: 1935/08/08 Sex: male     Chief Complaint  Patient presents with  . Re-evaluation    thrombosed access -eval for new access    HISTORY OF PRESENT ILLNESS: The patient is here today for discussions regarding new dialysis access.  He was seen by Dr. Scot Dock in the hospital.  Vein mapping showed a clot in the cephalic vein at the antecubital level on the right.  The vein was noted to be thick walled in the forearm and the upper arm.  The basilic vein on the right also has some chronic thrombus and thickening.  Recommendation was for a right forearm graft or possibly a fistula.  He is back today with a catheter in his left groin.  He has no complaints.  He is on dialysis Tuesday Thursday Saturday.  Past Medical History  Diagnosis Date  . Hypertension   . Hyperlipidemia   . Cancer     prostate  . Gout   . Anemia     Iron deficiency  . Thyroid disease   . Malnutrition   . Atrial fibrillation     paroxysmal  . Chronic kidney disease     Pt has HD on TUESDAY, THURSDAY and SATURDAY  at [facility]  . Peripheral vascular disease   . Coronary artery disease   . Shortness of breath     Past Surgical History  Procedure Laterality Date  . Av fistula placement  06/2006    left forearm  . Amputation      right finger  . Incise and drain abcess      chronic complex infection right long finger  . Insertion of dialysis catheter  05/30/2010    right internal jugular Palindrome catheter  . Aortogram  06/01/11    w/ runoff  . Tee without cardioversion N/A 10/25/2013    Procedure: TRANSESOPHAGEAL ECHOCARDIOGRAM (TEE);  Surgeon: [name], MD;  Location: [facility];  Service: Cardiovascular;  Laterality: N/A;  . Insertion of dialysis catheter N/A 10/25/2013    Procedure: INSERTION OF DIALYSIS CATHETER;  Surgeon: Elam Dutch, MD;  Location: [facility];  Service: Vascular;  Laterality: N/A;  Attempted both right and left neck, procedure  unsucessful.  . Cardiac catheterization    . Coronary angioplasty      History   Social History  . Marital Status: Widowed    Spouse Name: N/A    Number of Children: N/A  . Years of Education: N/A   Occupational History  . Not on file.   Social History Main Topics  . Smoking status: Former Smoker -- 0.50 packs/day    Types: Cigarettes    Quit date: 02/17/1972  . Smokeless tobacco: Never Used  . Alcohol Use: No  . Drug Use: No  . Sexual Activity: Not on file   Other Topics Concern  . Not on file   Social History Narrative  . No narrative on file    Family History  Problem Relation Age of Onset  . Hypertension Mother   . Asthma Father   . Cancer Sister     breast  . Kidney disease Brother   . Diabetes Brother     Allergies as of 11/20/2013 - Review Complete 11/20/2013  Allergen Reaction Noted  . Altace [ramipril] Other (See Comments) and Cough 11/09/2013    Current Outpatient Prescriptions on File Prior to Visit  Medication Sig Dispense Refill  . acetaminophen (TYLENOL) 500 MG  tablet Take 1 tablet (500 mg total) by mouth every 6 (six) hours as needed for moderate pain.  30 tablet  0  . aspirin EC 81 MG EC tablet Take 1 tablet (81 mg total) by mouth daily.      . calcium acetate (PHOSLO) 667 MG capsule Take 667 mg by mouth 3 (three) times daily with meals.       . citalopram (CELEXA) 20 MG tablet Take 20 mg by mouth at bedtime. Depression      . cloNIDine (CATAPRES) 0.3 MG tablet Take 1 tablet (0.3 mg total) by mouth at bedtime.      . colchicine 0.6 MG tablet Take 0.6 mg by mouth daily.      . cyclobenzaprine (FLEXERIL) 10 MG tablet Take one at bed time for back pain and spasms  30 tablet  0  . gabapentin (NEURONTIN) 300 MG capsule Take 300 mg by mouth at bedtime.      Marland Kitchen HYDROcodone-acetaminophen (NORCO/VICODIN) 5-325 MG per tablet Take 1 tablet by mouth daily.      . metoprolol tartrate (LOPRESSOR) 12.5 mg TABS tablet Take 0.5 tablets (12.5 mg total) by mouth  2 (two) times daily.  60 tablet  6  . multivitamin (RENA-VIT) TABS tablet Take 1 tablet by mouth daily.      . ondansetron (ZOFRAN-ODT) 4 MG disintegrating tablet Take 4 mg by mouth every 8 (eight) hours as needed for nausea or vomiting.      . pantoprazole (PROTONIX) 40 MG tablet Take 40 mg by mouth daily.      . simvastatin (ZOCOR) 10 MG tablet Take 10 mg by mouth at bedtime.      . Ticagrelor (BRILINTA) 90 MG TABS tablet Take 1 tablet (90 mg total) by mouth 2 (two) times daily.  60 tablet  12   No current facility-administered medications on file prior to visit.     REVIEW OF SYSTEMS: Cardiovascular: No chest pain, chest pressure, palpitations, orthopnea, or dyspnea on exertion. No claudication or rest pain,  No history of DVT or phlebitis. Pulmonary: No productive cough, asthma or wheezing. Neurologic: No weakness, paresthesias, aphasia, or amaurosis. No dizziness. Hematologic: No bleeding problems or clotting disorders. Musculoskeletal: No joint pain or joint swelling. Gastrointestinal: No blood in stool or hematemesis Genitourinary: No dysuria or hematuria. Psychiatric:: No history of major depression. Integumentary: No rashes or ulcers. Constitutional: No fever or chills.  PHYSICAL EXAMINATION:   Vital signs are BP 149/89  Pulse 85  Ht 5' 11.5" (1.816 m)  Wt 188 lb 9.6 oz (85.548 kg)  BMI 25.94 kg/m2  SpO2 98% General: The patient appears their stated age. HEENT:  No gross abnormalities Pulmonary:  Non labored breathing Abdomen: Soft and non-tender Musculoskeletal: There are no major deformities. Neurologic: No focal weakness or paresthesias are detected, Skin: There are no ulcer or rashes noted. Psychiatric: The patient has normal affect. Cardiovascular: There is a regular rate and rhythm without significant murmur appreciated.  I cannot palpate a right radial pulse, however he has a good brachial pulse.   Diagnostic Studies None  Assessment: End-stage renal  disease Plan: Dr. Scot Dock has recommended a right arm procedure because he has a brachial artery stenosis on the left arm.  Because there was difficulty getting a catheter in place, I wanted to confirm that there is no central stenosis on the right before proceeding with a access procedure.  Therefore, I have scheduled the patient for a right arm and central venogram this coming Wednesday.  Based on these results, I will schedule him for his access procedure.  Eldridge Abrahams, M.D. [facility] Office: [phone] Pager:  [phone]

## 2013-11-22 ENCOUNTER — Encounter (HOSPITAL_COMMUNITY)
Admission: RE | Disposition: A | Discharge status: Home / Self Care | Payer: Self-pay | Source: Ambulatory Visit | Attending: Surgery

## 2013-11-22 ENCOUNTER — Ambulatory Visit (HOSPITAL_COMMUNITY)
Admission: RE | Admit: 2013-11-22 | Discharge: 2013-11-22 | Disposition: A | Discharge status: Home / Self Care | Payer: Medicare Other | Source: Ambulatory Visit | Attending: Surgery | Admitting: Surgery

## 2013-11-22 DIAGNOSIS — Z7982 Long term (current) use of aspirin: Secondary | ICD-10-CM | POA: Insufficient documentation

## 2013-11-22 DIAGNOSIS — M109 Gout, unspecified: Secondary | ICD-10-CM | POA: Insufficient documentation

## 2013-11-22 DIAGNOSIS — I4891 Unspecified atrial fibrillation: Secondary | ICD-10-CM | POA: Insufficient documentation

## 2013-11-22 DIAGNOSIS — Z87891 Personal history of nicotine dependence: Secondary | ICD-10-CM | POA: Insufficient documentation

## 2013-11-22 DIAGNOSIS — N186 End stage renal disease: Secondary | ICD-10-CM | POA: Insufficient documentation

## 2013-11-22 DIAGNOSIS — Z7902 Long term (current) use of antithrombotics/antiplatelets: Secondary | ICD-10-CM | POA: Insufficient documentation

## 2013-11-22 DIAGNOSIS — Z9861 Coronary angioplasty status: Secondary | ICD-10-CM | POA: Insufficient documentation

## 2013-11-22 DIAGNOSIS — D509 Iron deficiency anemia, unspecified: Secondary | ICD-10-CM | POA: Insufficient documentation

## 2013-11-22 DIAGNOSIS — I2510 Atherosclerotic heart disease of native coronary artery without angina pectoris: Secondary | ICD-10-CM | POA: Insufficient documentation

## 2013-11-22 DIAGNOSIS — I739 Peripheral vascular disease, unspecified: Secondary | ICD-10-CM | POA: Insufficient documentation

## 2013-11-22 DIAGNOSIS — Z992 Dependence on renal dialysis: Secondary | ICD-10-CM | POA: Insufficient documentation

## 2013-11-22 DIAGNOSIS — I120 Hypertensive chronic kidney disease with stage 5 chronic kidney disease or end stage renal disease: Secondary | ICD-10-CM | POA: Insufficient documentation

## 2013-11-22 DIAGNOSIS — E785 Hyperlipidemia, unspecified: Secondary | ICD-10-CM | POA: Insufficient documentation

## 2013-11-22 DIAGNOSIS — T82898A Other specified complication of vascular prosthetic devices, implants and grafts, initial encounter: Secondary | ICD-10-CM

## 2013-11-22 HISTORY — PX: VENOGRAM: SHX5497

## 2013-11-22 LAB — POCT I-STAT, CHEM 8
BUN: 25 mg/dL — ABNORMAL HIGH (ref 6–23)
CALCIUM ION: 1.07 mmol/L — AB (ref 1.13–1.30)
Chloride: 102 mEq/L (ref 96–112)
Creatinine, Ser: 7.2 mg/dL — ABNORMAL HIGH (ref 0.50–1.35)
Glucose, Bld: 87 mg/dL (ref 70–99)
HEMATOCRIT: 34 % — AB (ref 39.0–52.0)
HEMOGLOBIN: 11.6 g/dL — AB (ref 13.0–17.0)
Potassium: 4.1 mEq/L (ref 3.7–5.3)
Sodium: 141 mEq/L (ref 137–147)
TCO2: 29 mmol/L (ref 0–100)

## 2013-11-22 SURGERY — VENOGRAM
Anesthesia: LOCAL | Laterality: Right

## 2013-11-22 MED ORDER — LABETALOL HCL 5 MG/ML IV SOLN: 10.0000 mg | INTRAVENOUS | Status: DC | PRN

## 2013-11-22 MED ORDER — HYDRALAZINE HCL 20 MG/ML IJ SOLN: 10.0000 mg | INTRAMUSCULAR | Status: DC | PRN

## 2013-11-22 MED ORDER — ONDANSETRON HCL 4 MG/2ML IJ SOLN: 4.0000 mg | Freq: Four times a day (QID) | INTRAMUSCULAR | Status: DC | PRN

## 2013-11-22 MED ORDER — SODIUM CHLORIDE 0.9 % IJ SOLN: 3.0000 mL | INTRAMUSCULAR | Status: DC | PRN

## 2013-11-22 NOTE — Interval H&P Note (Signed)
History and Physical Interval Note:  11/22/2013 8:18 AM  Brad Mcintosh  has presented today for surgery, with the diagnosis of right arm/instage renal  The various methods of treatment have been discussed with the patient and family. After consideration of risks, benefits and other options for treatment, the patient has consented to  Procedure(s): VENOGRAM (Right) as a surgical intervention .  The patient's history has been reviewed, patient examined, no change in status, stable for surgery.  I have reviewed the patient's chart and labs.  Questions were answered to the patient's satisfaction.     BRABHAM IV, V. WELLS

## 2013-11-22 NOTE — Discharge Instructions (Signed)
Venogram, Care After ° °Refer to this sheet in the next few weeks. These instructions provide you with information on caring for yourself after your procedure. Your health care provider may also give you more specific instructions. Your treatment has been planned according to current medical practices, but problems sometimes occur. Call your health care provider if you have any problems or questions after your procedure. °WHAT TO EXPECT AFTER THE PROCEDURE °After your procedure, it is typical to have the following sensations: °· Mild discomfort at the catheter insertion site. °HOME CARE INSTRUCTIONS  °· Take all medicines exactly as directed. °· Follow any prescribed diet. °· Follow instructions regarding both rest and physical activity. °· Drink more fluids for the first several days after the procedure, in order to help flush dye from your kidneys. °SEEK MEDICAL CARE IF: °· You develop a rash. °SEEK IMMEDIATE MEDICAL CARE IF °· You have fever not controlled by medicine. °· There is pain, drainage, bleeding, redness, swelling, warmth or a red streak at the site of the IV tube. °· The extremity where your IV tube was placed becomes discolored, numb, or cool. °· You have difficulty breathing or shortness of breath. °· You develop chest pain. °· You have excessive dizziness or fainting. °Document Released: 07/19/2013 Document Reviewed: 06/05/2013 °Exit[employer]® Patient Information ©2014 [employer]. ° °

## 2013-11-22 NOTE — H&P (View-Only) (Signed)
Patient name: Brad Mcintosh MRN: [id number] DOB: 1935/08/08 Sex: male     Chief Complaint  Patient presents with  . Re-evaluation    thrombosed access -eval for new access    HISTORY OF PRESENT ILLNESS: The patient is here today for discussions regarding new dialysis access.  He was seen by Dr. Scot Dock in the hospital.  Vein mapping showed a clot in the cephalic vein at the antecubital level on the right.  The vein was noted to be thick walled in the forearm and the upper arm.  The basilic vein on the right also has some chronic thrombus and thickening.  Recommendation was for a right forearm graft or possibly a fistula.  He is back today with a catheter in his left groin.  He has no complaints.  He is on dialysis Tuesday Thursday Saturday.  Past Medical History  Diagnosis Date  . Hypertension   . Hyperlipidemia   . Cancer     prostate  . Gout   . Anemia     Iron deficiency  . Thyroid disease   . Malnutrition   . Atrial fibrillation     paroxysmal  . Chronic kidney disease     Pt has HD on TUESDAY, THURSDAY and SATURDAY  at [facility]  . Peripheral vascular disease   . Coronary artery disease   . Shortness of breath     Past Surgical History  Procedure Laterality Date  . Av fistula placement  06/2006    left forearm  . Amputation      right finger  . Incise and drain abcess      chronic complex infection right long finger  . Insertion of dialysis catheter  05/30/2010    right internal jugular Palindrome catheter  . Aortogram  06/01/11    w/ runoff  . Tee without cardioversion N/A 10/25/2013    Procedure: TRANSESOPHAGEAL ECHOCARDIOGRAM (TEE);  Surgeon: [name], MD;  Location: [facility];  Service: Cardiovascular;  Laterality: N/A;  . Insertion of dialysis catheter N/A 10/25/2013    Procedure: INSERTION OF DIALYSIS CATHETER;  Surgeon: Elam Dutch, MD;  Location: [facility];  Service: Vascular;  Laterality: N/A;  Attempted both right and left neck, procedure  unsucessful.  . Cardiac catheterization    . Coronary angioplasty      History   Social History  . Marital Status: Widowed    Spouse Name: N/A    Number of Children: N/A  . Years of Education: N/A   Occupational History  . Not on file.   Social History Main Topics  . Smoking status: Former Smoker -- 0.50 packs/day    Types: Cigarettes    Quit date: 02/17/1972  . Smokeless tobacco: Never Used  . Alcohol Use: No  . Drug Use: No  . Sexual Activity: Not on file   Other Topics Concern  . Not on file   Social History Narrative  . No narrative on file    Family History  Problem Relation Age of Onset  . Hypertension Mother   . Asthma Father   . Cancer Sister     breast  . Kidney disease Brother   . Diabetes Brother     Allergies as of 11/20/2013 - Review Complete 11/20/2013  Allergen Reaction Noted  . Altace [ramipril] Other (See Comments) and Cough 11/09/2013    Current Outpatient Prescriptions on File Prior to Visit  Medication Sig Dispense Refill  . acetaminophen (TYLENOL) 500 MG  tablet Take 1 tablet (500 mg total) by mouth every 6 (six) hours as needed for moderate pain.  30 tablet  0  . aspirin EC 81 MG EC tablet Take 1 tablet (81 mg total) by mouth daily.      . calcium acetate (PHOSLO) 667 MG capsule Take 667 mg by mouth 3 (three) times daily with meals.       . citalopram (CELEXA) 20 MG tablet Take 20 mg by mouth at bedtime. Depression      . cloNIDine (CATAPRES) 0.3 MG tablet Take 1 tablet (0.3 mg total) by mouth at bedtime.      . colchicine 0.6 MG tablet Take 0.6 mg by mouth daily.      . cyclobenzaprine (FLEXERIL) 10 MG tablet Take one at bed time for back pain and spasms  30 tablet  0  . gabapentin (NEURONTIN) 300 MG capsule Take 300 mg by mouth at bedtime.      Marland Kitchen HYDROcodone-acetaminophen (NORCO/VICODIN) 5-325 MG per tablet Take 1 tablet by mouth daily.      . metoprolol tartrate (LOPRESSOR) 12.5 mg TABS tablet Take 0.5 tablets (12.5 mg total) by mouth  2 (two) times daily.  60 tablet  6  . multivitamin (RENA-VIT) TABS tablet Take 1 tablet by mouth daily.      . ondansetron (ZOFRAN-ODT) 4 MG disintegrating tablet Take 4 mg by mouth every 8 (eight) hours as needed for nausea or vomiting.      . pantoprazole (PROTONIX) 40 MG tablet Take 40 mg by mouth daily.      . simvastatin (ZOCOR) 10 MG tablet Take 10 mg by mouth at bedtime.      . Ticagrelor (BRILINTA) 90 MG TABS tablet Take 1 tablet (90 mg total) by mouth 2 (two) times daily.  60 tablet  12   No current facility-administered medications on file prior to visit.     REVIEW OF SYSTEMS: Cardiovascular: No chest pain, chest pressure, palpitations, orthopnea, or dyspnea on exertion. No claudication or rest pain,  No history of DVT or phlebitis. Pulmonary: No productive cough, asthma or wheezing. Neurologic: No weakness, paresthesias, aphasia, or amaurosis. No dizziness. Hematologic: No bleeding problems or clotting disorders. Musculoskeletal: No joint pain or joint swelling. Gastrointestinal: No blood in stool or hematemesis Genitourinary: No dysuria or hematuria. Psychiatric:: No history of major depression. Integumentary: No rashes or ulcers. Constitutional: No fever or chills.  PHYSICAL EXAMINATION:   Vital signs are BP 149/89  Pulse 85  Ht 5' 11.5" (1.816 m)  Wt 188 lb 9.6 oz (85.548 kg)  BMI 25.94 kg/m2  SpO2 98% General: The patient appears their stated age. HEENT:  No gross abnormalities Pulmonary:  Non labored breathing Abdomen: Soft and non-tender Musculoskeletal: There are no major deformities. Neurologic: No focal weakness or paresthesias are detected, Skin: There are no ulcer or rashes noted. Psychiatric: The patient has normal affect. Cardiovascular: There is a regular rate and rhythm without significant murmur appreciated.  I cannot palpate a right radial pulse, however he has a good brachial pulse.   Diagnostic Studies None  Assessment: End-stage renal  disease Plan: Dr. Scot Dock has recommended a right arm procedure because he has a brachial artery stenosis on the left arm.  Because there was difficulty getting a catheter in place, I wanted to confirm that there is no central stenosis on the right before proceeding with a access procedure.  Therefore, I have scheduled the patient for a right arm and central venogram this coming Wednesday.  Based on these results, I will schedule him for his access procedure.  Eldridge Abrahams, M.D. [facility] Office: [phone] Pager:  [phone]

## 2013-11-22 NOTE — Op Note (Signed)
    Patient name: Brad Mcintosh MRN: [id number] DOB: 04-23-1935 Sex: male  11/22/2013 Pre-operative Diagnosis: ESRD Post-operative diagnosis:  Same Surgeon:  Eldridge Abrahams Procedure Performed:  1.  R arm and central venogram   Indications:  Here for access management  Procedure:  IV was placed in holding and contrast was then injected   Findings:  No central stenosis.  Sclerotic UE veins   Intervention:  none  Impression:  #1  No central stenosis  #2  Will schedule for R FA AVGG    V. Annamarie Major, M.D. [facility] Office: [phone] Pager:  [phone]

## 2013-12-08 ENCOUNTER — Encounter (HOSPITAL_COMMUNITY): Payer: Self-pay | Admitting: Pharmacy Technician

## 2013-12-08 ENCOUNTER — Other Ambulatory Visit: Payer: Self-pay

## 2013-12-11 ENCOUNTER — Encounter (HOSPITAL_COMMUNITY): Payer: Self-pay | Admitting: Vascular Surgery

## 2013-12-11 ENCOUNTER — Encounter (HOSPITAL_COMMUNITY): Payer: Self-pay | Admitting: *Deleted

## 2013-12-11 NOTE — Progress Notes (Signed)
Anesthesia Chart Review:  Patient is a 79 year old male scheduled for insertion of the RUE AVGG on 12/13/13 by Dr. [name].  He is scheduled to be a same day work-up.  History includes ESRD on HD TTS currently via a left femoral vein tunneled catheter, CAD with NSTEMI 10/21/2013 (in the setting of LLL PNA with sepsis) followed by DES to LAD and LCX 11/09/13, afib/PAF, former smoker, HTN, HLD, PAD, right finger and bilateral great toe amputations, GERD, iron deficiency anemia, prostate cancer, gout, thyroid disease (not specified; normal TSH 10/23/13). Cardiologist is Dr. Doylene Canard, with reported last visit within the past month.  Patient reported that he remains on dual anti-platelet therapy.  Cardiac cath on 11/09/13 (Dr. Doylene Canard) showed: 1. Calcific left main coronary artery. 2. Severe disease of left anterior descending artery and minimal disease of its branches. 3. Severe disease of left circumflex artery and its branches. 4. Total occlusion of right coronary artery. 5. Mild left ventricular systolic dysfunction. Ejection fraction 45 % by Echocardiogram. 6. He subsequently underwent PTCA/stenting to ostial and proximal LAD and ostial and proximal left circumflex with Xience Alpine DES (Dr. Terrence Dupont).   Echo on 10/25/13 showed:  - Left ventricle: Systolic function was mildly reduced. The estimated ejection fraction was in the range of 45% to 50%. Hypokinesis of the anteroseptal myocardium. - Aortic valve: Cusp separation was moderately reduced. There was moderate stenosis. Mild to moderate regurgitation. - Mitral valve: Moderately to severely calcified annulus. Moderate regurgitation. - Left atrium: The atrium was mildly dilated. No evidence of thrombus in the atrial cavity or appendage. - Right atrium: The atrium was mildly dilated. No evidence of thrombus in the atrial cavity or appendage. - Atrial septum: No defect or patent foramen ovale was identified. Echo contrast study showed no right-to-left  atrial level shunt, at baseline or with provocation.  EKG on 11/10/13 showed NSR with marked first degree AVB, non-specific ST/T wave abnormality, prolonged QT.   1V CXR on 10/25/13 showed: Cardiomegaly with mild interstitial edema, no consolidation, no pneumothorax, catheter extending from the inferior to the chest with tip overlying right atrium, atherosclerotic changes in the aorta.  He is for labs on arrival.  Patient has ESRD with need for permanent HD access.  He is > 6 weeks out from NSTEMI with DES to LAD and CX just over 4 weeks ago.  He reported to the interviewing RN that he remains on dual anti-platelet therapy.  No new chest pain reported.  He is tolerating HD via a Diatek catheter.  If no acute changes and labs are acceptable then I would anticipate that he could proceed as planned. Reviewed with anesthesiologist Dr. Tobias Alexander.  George Hugh [facility] Stay [facility]/Anesthesiology Phone [phone] 12/11/2013 3:11 PM

## 2013-12-12 MED ORDER — DEXTROSE 5 % IV SOLN
1.5000 g | INTRAVENOUS | Status: DC
  Filled 2013-12-12: qty 1.5

## 2013-12-13 ENCOUNTER — Encounter (HOSPITAL_COMMUNITY): Admission: RE | Payer: Self-pay | Source: Ambulatory Visit

## 2013-12-13 ENCOUNTER — Ambulatory Visit (HOSPITAL_COMMUNITY): Admission: RE | Admit: 2013-12-13 | Payer: Medicare Other | Source: Ambulatory Visit | Admitting: Vascular Surgery

## 2013-12-13 HISTORY — DX: Nonrheumatic aortic (valve) stenosis: I35.0

## 2013-12-13 HISTORY — DX: Acute myocardial infarction, unspecified: I21.9

## 2013-12-13 HISTORY — DX: Gastro-esophageal reflux disease without esophagitis: K21.9

## 2013-12-13 HISTORY — DX: Pneumonia, unspecified organism: J18.9

## 2013-12-13 SURGERY — INSERTION OF ARTERIOVENOUS (AV) GORE-TEX GRAFT ARM
Anesthesia: Monitor Anesthesia Care | Site: Arm Lower | Laterality: Right

## 2013-12-15 ENCOUNTER — Other Ambulatory Visit: Payer: Self-pay

## 2013-12-15 ENCOUNTER — Telehealth: Payer: Self-pay

## 2013-12-15 NOTE — Telephone Encounter (Signed)
Phone call to Dr. Doylene Canard to discuss pt's need for surgery for Insertion Right Forearm AVGG.  Pt. Has recent hx of PTCA of LAD and Left Cx Artery on 11/09/13.  Discussed need for pt. to hold Brilinta at least 3 days prior to surgery.  Per Dr. Doylene Canard, pt. is too high risk to stop Brilinta without bridging with Lovenox.  Recommended to inquire if pt's insurance will cover the Lovenox as an outpatient, as it is very expensive.  If insurance will not cover the Lovenox as an OP, then consideration will be given to admit pt. For anticoagulation bridging prior to surgery.   Phone call to [employer], [place] [employer] @ [phone]; spoke with "Thayer Jew" regarding Lovenox coverage as an outpatient.  Was advised that per pt's plan, Lovenox Bridge as an "outpatient" will be covered, with no pre-authorization required.  Notified pt's son of need to schedule pt's surgery further out, to allow time to coordinate the lovenox bridge.  Per son, [place], please have Dr. Merrilee Jansky office call him @ [phone] to schedule appt. for Lovenox teaching, as he will need to schedule time off work to bring pt. to appt.   Discussed with son, plan to schedule surgery for 12/27/13, and will notify Dr. Merrilee Jansky office nurse of need to schedule appt. for Lovenox teaching.  Verb. Understanding.

## 2013-12-27 ENCOUNTER — Ambulatory Visit (HOSPITAL_COMMUNITY): Admission: RE | Admit: 2013-12-27 | Payer: Medicare Other | Source: Ambulatory Visit | Admitting: Vascular Surgery

## 2013-12-27 ENCOUNTER — Encounter (HOSPITAL_COMMUNITY): Admission: RE | Payer: Self-pay | Source: Ambulatory Visit

## 2013-12-27 SURGERY — INSERTION OF ARTERIOVENOUS (AV) GORE-TEX GRAFT ARM
Anesthesia: Monitor Anesthesia Care | Site: Arm Lower | Laterality: Right

## 2014-01-19 ENCOUNTER — Other Ambulatory Visit: Payer: Self-pay

## 2014-01-19 ENCOUNTER — Telehealth: Payer: Self-pay

## 2014-01-19 NOTE — Telephone Encounter (Signed)
Notified Dr. Merrilee Jansky office of plan to reschedule pt's surgery to 01/31/14 for Insertion of Right FA Alexis for hemodialysis.  Spoke with Ian Malkin; advised pt. Will need to hold Brilinta after 4/17, and will resume following surgery on 4/22.  Per previous recommendation of Dr. Doylene Canard, pt. will require Lovenox bridge.  Requested that Dr. Doylene Canard manage the Lovenox bridge prior to pt's surgery.  Per Ian Malkin, fax information and their office will arrange Lovenox.  Note faxed to [phone].

## 2014-01-29 ENCOUNTER — Encounter (HOSPITAL_COMMUNITY): Payer: Self-pay | Admitting: *Deleted

## 2014-01-29 NOTE — Progress Notes (Signed)
Brad Mcintosh reports that he stopped Brilanta on Friday, April 17 and started Lovenox injections on Sunday, April 19.  Patient is unsure of does of Lovenox, but will bring the dose on day of surgery.  " back to Dr Loetta Rough on Tuesday, I'll find out."

## 2014-01-30 MED ORDER — DEXTROSE 5 % IV SOLN
1.5000 g | INTRAVENOUS | Status: AC
  Administered 2014-01-31: 1.5 g via INTRAVENOUS
  Filled 2014-01-30: qty 1.5

## 2014-01-30 MED ORDER — CHLORHEXIDINE GLUCONATE CLOTH 2 % EX PADS: 6.0000 | MEDICATED_PAD | Freq: Once | CUTANEOUS | Status: DC

## 2014-01-30 MED ORDER — SODIUM CHLORIDE 0.9 % IV SOLN
INTRAVENOUS | Status: DC
  Administered 2014-01-31 (×2): via INTRAVENOUS

## 2014-01-31 ENCOUNTER — Ambulatory Visit (HOSPITAL_COMMUNITY): Payer: Medicare Other | Admitting: Anesthesiology

## 2014-01-31 ENCOUNTER — Ambulatory Visit (HOSPITAL_COMMUNITY): Payer: Medicare Other

## 2014-01-31 ENCOUNTER — Ambulatory Visit (HOSPITAL_COMMUNITY)
Admission: RE | Admit: 2014-01-31 | Discharge: 2014-01-31 | Disposition: A | Discharge status: Home / Self Care | Payer: Medicare Other | Source: Ambulatory Visit | Attending: Vascular Surgery | Admitting: Vascular Surgery

## 2014-01-31 ENCOUNTER — Encounter (HOSPITAL_COMMUNITY): Payer: Medicare Other | Admitting: Anesthesiology

## 2014-01-31 ENCOUNTER — Encounter (HOSPITAL_COMMUNITY)
Admission: RE | Disposition: A | Discharge status: Home / Self Care | Payer: Self-pay | Source: Ambulatory Visit | Attending: Vascular Surgery

## 2014-01-31 ENCOUNTER — Encounter (HOSPITAL_COMMUNITY): Payer: Self-pay | Admitting: Anesthesiology

## 2014-01-31 DIAGNOSIS — Z992 Dependence on renal dialysis: Secondary | ICD-10-CM | POA: Insufficient documentation

## 2014-01-31 DIAGNOSIS — C001 Malignant neoplasm of external lower lip: Secondary | ICD-10-CM | POA: Insufficient documentation

## 2014-01-31 DIAGNOSIS — I4891 Unspecified atrial fibrillation: Secondary | ICD-10-CM | POA: Insufficient documentation

## 2014-01-31 DIAGNOSIS — I359 Nonrheumatic aortic valve disorder, unspecified: Secondary | ICD-10-CM | POA: Insufficient documentation

## 2014-01-31 DIAGNOSIS — Z7982 Long term (current) use of aspirin: Secondary | ICD-10-CM | POA: Insufficient documentation

## 2014-01-31 DIAGNOSIS — Z79899 Other long term (current) drug therapy: Secondary | ICD-10-CM | POA: Insufficient documentation

## 2014-01-31 DIAGNOSIS — I120 Hypertensive chronic kidney disease with stage 5 chronic kidney disease or end stage renal disease: Secondary | ICD-10-CM | POA: Insufficient documentation

## 2014-01-31 DIAGNOSIS — I739 Peripheral vascular disease, unspecified: Secondary | ICD-10-CM | POA: Insufficient documentation

## 2014-01-31 DIAGNOSIS — D509 Iron deficiency anemia, unspecified: Secondary | ICD-10-CM | POA: Insufficient documentation

## 2014-01-31 DIAGNOSIS — Z8546 Personal history of malignant neoplasm of prostate: Secondary | ICD-10-CM | POA: Insufficient documentation

## 2014-01-31 DIAGNOSIS — N186 End stage renal disease: Secondary | ICD-10-CM

## 2014-01-31 DIAGNOSIS — K219 Gastro-esophageal reflux disease without esophagitis: Secondary | ICD-10-CM | POA: Insufficient documentation

## 2014-01-31 DIAGNOSIS — E785 Hyperlipidemia, unspecified: Secondary | ICD-10-CM | POA: Insufficient documentation

## 2014-01-31 DIAGNOSIS — Z9861 Coronary angioplasty status: Secondary | ICD-10-CM | POA: Insufficient documentation

## 2014-01-31 DIAGNOSIS — Z87891 Personal history of nicotine dependence: Secondary | ICD-10-CM | POA: Insufficient documentation

## 2014-01-31 DIAGNOSIS — I252 Old myocardial infarction: Secondary | ICD-10-CM | POA: Insufficient documentation

## 2014-01-31 DIAGNOSIS — E039 Hypothyroidism, unspecified: Secondary | ICD-10-CM | POA: Insufficient documentation

## 2014-01-31 HISTORY — PX: AV FISTULA PLACEMENT: SHX1204

## 2014-01-31 HISTORY — DX: Personal history of other medical treatment: Z92.89

## 2014-01-31 LAB — POCT I-STAT 4, (NA,K, GLUC, HGB,HCT)
Glucose, Bld: 89 mg/dL (ref 70–99)
HEMATOCRIT: 41 % (ref 39.0–52.0)
Hemoglobin: 13.9 g/dL (ref 13.0–17.0)
POTASSIUM: 4.3 meq/L (ref 3.7–5.3)
SODIUM: 140 meq/L (ref 137–147)

## 2014-01-31 LAB — PROTIME-INR
INR: 1.05 (ref 0.00–1.49)
Prothrombin Time: 13.5 seconds (ref 11.6–15.2)

## 2014-01-31 SURGERY — INSERTION OF ARTERIOVENOUS (AV) GORE-TEX GRAFT ARM
Anesthesia: Monitor Anesthesia Care | Site: Arm Lower | Laterality: Right

## 2014-01-31 MED ORDER — THROMBIN 20000 UNITS EX SOLR
CUTANEOUS | Status: AC
  Filled 2014-01-31: qty 20000

## 2014-01-31 MED ORDER — SODIUM CHLORIDE 0.9 % IR SOLN
Status: DC | PRN
  Administered 2014-01-31: 09:00:00

## 2014-01-31 MED ORDER — FENTANYL CITRATE 0.05 MG/ML IJ SOLN
INTRAMUSCULAR | Status: DC | PRN
  Administered 2014-01-31 (×3): 50 ug via INTRAVENOUS

## 2014-01-31 MED ORDER — LIDOCAINE HCL (CARDIAC) 20 MG/ML IV SOLN
INTRAVENOUS | Status: AC
  Filled 2014-01-31: qty 5

## 2014-01-31 MED ORDER — PHENYLEPHRINE HCL 10 MG/ML IJ SOLN
10.0000 mg | INTRAVENOUS | Status: DC | PRN
  Administered 2014-01-31: 40 ug/min via INTRAVENOUS

## 2014-01-31 MED ORDER — PROTAMINE SULFATE 10 MG/ML IV SOLN
INTRAVENOUS | Status: DC | PRN
  Administered 2014-01-31: 5 mg via INTRAVENOUS
  Administered 2014-01-31: 10 mg via INTRAVENOUS

## 2014-01-31 MED ORDER — FENTANYL CITRATE 0.05 MG/ML IJ SOLN
INTRAMUSCULAR | Status: AC
  Filled 2014-01-31: qty 5

## 2014-01-31 MED ORDER — PROTAMINE SULFATE 10 MG/ML IV SOLN
INTRAVENOUS | Status: AC
  Filled 2014-01-31: qty 5

## 2014-01-31 MED ORDER — METOPROLOL TARTRATE 12.5 MG HALF TABLET
ORAL_TABLET | ORAL | Status: AC
  Administered 2014-01-31: 12.5 mg
  Filled 2014-01-31: qty 1

## 2014-01-31 MED ORDER — HEPARIN SODIUM (PORCINE) 1000 UNIT/ML IJ SOLN
INTRAMUSCULAR | Status: DC | PRN
  Administered 2014-01-31: 5000 [IU] via INTRAVENOUS

## 2014-01-31 MED ORDER — 0.9 % SODIUM CHLORIDE (POUR BTL) OPTIME
TOPICAL | Status: DC | PRN
  Administered 2014-01-31: 1000 mL

## 2014-01-31 MED ORDER — HEPARIN SODIUM (PORCINE) 1000 UNIT/ML IJ SOLN
INTRAMUSCULAR | Status: AC
  Filled 2014-01-31: qty 1

## 2014-01-31 MED ORDER — THROMBIN 20000 UNITS EX SOLR
CUTANEOUS | Status: DC | PRN
  Administered 2014-01-31: 10:00:00 via TOPICAL

## 2014-01-31 MED ORDER — LIDOCAINE HCL (CARDIAC) 20 MG/ML IV SOLN
INTRAVENOUS | Status: DC | PRN
  Administered 2014-01-31: 100 mg via INTRAVENOUS

## 2014-01-31 MED ORDER — PROPOFOL 10 MG/ML IV BOLUS
INTRAVENOUS | Status: AC
  Filled 2014-01-31: qty 20

## 2014-01-31 MED ORDER — CHLORHEXIDINE GLUCONATE CLOTH 2 % EX PADS: 6.0000 | MEDICATED_PAD | Freq: Once | CUTANEOUS | Status: DC

## 2014-01-31 MED ORDER — PROPOFOL 10 MG/ML IV BOLUS
INTRAVENOUS | Status: DC | PRN
  Administered 2014-01-31: 100 mg via INTRAVENOUS

## 2014-01-31 MED ORDER — OXYCODONE-ACETAMINOPHEN 5-325 MG PO TABS: 1.0000 | ORAL_TABLET | Freq: Four times a day (QID) | ORAL | Status: DC | PRN

## 2014-01-31 MED ORDER — ONDANSETRON HCL 4 MG/2ML IJ SOLN: 4.0000 mg | Freq: Once | INTRAMUSCULAR | Status: DC | PRN

## 2014-01-31 MED ORDER — HYDROMORPHONE HCL PF 1 MG/ML IJ SOLN
0.2500 mg | INTRAMUSCULAR | Status: DC | PRN
Start: 2014-01-31 — End: 2014-01-31

## 2014-01-31 SURGICAL SUPPLY — 43 items
ARMBAND PINK RESTRICT EXTREMIT (MISCELLANEOUS) ×3 IMPLANT
CANISTER SUCTION 2500CC (MISCELLANEOUS) ×3 IMPLANT
CLIP TI MEDIUM 6 (CLIP) ×3 IMPLANT
CLIP TI WIDE RED SMALL 6 (CLIP) ×3 IMPLANT
COVER PROBE W GEL 5X96 (DRAPES) ×3 IMPLANT
COVER SURGICAL LIGHT HANDLE (MISCELLANEOUS) ×3 IMPLANT
DECANTER SPIKE VIAL GLASS SM (MISCELLANEOUS) IMPLANT
DERMABOND ADVANCED (GAUZE/BANDAGES/DRESSINGS) ×2
DERMABOND ADVANCED .7 DNX12 (GAUZE/BANDAGES/DRESSINGS) ×1 IMPLANT
ELECT REM PT RETURN 9FT ADLT (ELECTROSURGICAL) ×3
ELECTRODE REM PT RTRN 9FT ADLT (ELECTROSURGICAL) ×1 IMPLANT
GLOVE BIO SURGEON STRL SZ 6.5 (GLOVE) ×2 IMPLANT
GLOVE BIO SURGEON STRL SZ7 (GLOVE) ×3 IMPLANT
GLOVE BIO SURGEONS STRL SZ 6.5 (GLOVE) ×1
GLOVE BIOGEL PI IND STRL 6.5 (GLOVE) ×1 IMPLANT
GLOVE BIOGEL PI IND STRL 7.5 (GLOVE) ×2 IMPLANT
GLOVE BIOGEL PI INDICATOR 6.5 (GLOVE) ×2
GLOVE BIOGEL PI INDICATOR 7.5 (GLOVE) ×4
GLOVE SS BIOGEL STRL SZ 7 (GLOVE) ×1 IMPLANT
GLOVE SUPERSENSE BIOGEL SZ 7 (GLOVE) ×2
GLOVE SURG SS PI 7.0 STRL IVOR (GLOVE) ×6 IMPLANT
GLOVE SURG SS PI 7.5 STRL IVOR (GLOVE) ×3 IMPLANT
GOWN STRL REUS W/ TWL LRG LVL3 (GOWN DISPOSABLE) ×4 IMPLANT
GOWN STRL REUS W/TWL LRG LVL3 (GOWN DISPOSABLE) ×8
GRAFT GORETEX STRT 4-7X45 (Vascular Products) ×3 IMPLANT
KIT BASIN OR (CUSTOM PROCEDURE TRAY) ×3 IMPLANT
KIT ROOM TURNOVER OR (KITS) ×3 IMPLANT
NS IRRIG 1000ML POUR BTL (IV SOLUTION) ×3 IMPLANT
PACK CV ACCESS (CUSTOM PROCEDURE TRAY) ×3 IMPLANT
PAD ARMBOARD 7.5X6 YLW CONV (MISCELLANEOUS) ×6 IMPLANT
SPONGE SURGIFOAM ABS GEL 100 (HEMOSTASIS) IMPLANT
SUT MNCRL AB 4-0 PS2 18 (SUTURE) ×3 IMPLANT
SUT PROLENE 5 0 C 1 24 (SUTURE) IMPLANT
SUT PROLENE 6 0 BV (SUTURE) ×6 IMPLANT
SUT PROLENE 7 0 BV 1 (SUTURE) ×3 IMPLANT
SUT SILK 2 0 FS (SUTURE) ×3 IMPLANT
SUT VIC AB 3-0 SH 27 (SUTURE) ×2
SUT VIC AB 3-0 SH 27X BRD (SUTURE) ×1 IMPLANT
SYR BULB IRRIGATION 50ML (SYRINGE) ×3 IMPLANT
TOWEL OR 17X24 6PK STRL BLUE (TOWEL DISPOSABLE) ×3 IMPLANT
TOWEL OR 17X26 10 PK STRL BLUE (TOWEL DISPOSABLE) ×3 IMPLANT
UNDERPAD 30X30 INCONTINENT (UNDERPADS AND DIAPERS) ×3 IMPLANT
WATER STERILE IRR 1000ML POUR (IV SOLUTION) ×3 IMPLANT

## 2014-01-31 NOTE — Progress Notes (Signed)
Patient has a peripheral IV placed in the left hand, removed once patient stated he was no nauseated and having no pain. Catheter was intact upon removal. Site clean dry and intact.

## 2014-01-31 NOTE — Transfer of Care (Signed)
Immediate Anesthesia Transfer of Care Note  Patient: Brad Mcintosh  Procedure(s) Performed: Procedure(s): INSERTION OF ARTERIOVENOUS (AV) GORE-TEX GRAFT ARM- RIGHT FOREARM (Right)  Patient Location: PACU  Anesthesia Type:General  Level of Consciousness: awake, alert  and patient cooperative  Airway & Oxygen Therapy: Patient Spontanous Breathing and Patient connected to face mask oxygen  Post-op Assessment: Report given to PACU RN, Post -op Vital signs reviewed and stable and Patient moving all extremities  Post vital signs: Reviewed and stable  Complications: No apparent anesthesia complications

## 2014-01-31 NOTE — Anesthesia Preprocedure Evaluation (Signed)
Anesthesia Evaluation  Patient identified by MRN, date of birth, ID band Patient awake    Reviewed: Allergy & Precautions, H&P , NPO status , Patient's Chart, lab work & pertinent test results  Airway       Dental   Pulmonary former smoker,          Cardiovascular hypertension, + CAD, + Past MI and + Peripheral Vascular Disease + dysrhythmias Atrial Fibrillation + Valvular Problems/Murmurs AS     Neuro/Psych    GI/Hepatic GERD-  ,  Endo/Other  Hypothyroidism   Renal/GU ESRF and DialysisRenal disease     Musculoskeletal   Abdominal   Peds  Hematology  (+) anemia ,   Anesthesia Other Findings   Reproductive/Obstetrics                           Anesthesia Physical Anesthesia Plan  ASA: III  Anesthesia Plan: General and MAC   Post-op Pain Management:    Induction: Intravenous  Airway Management Planned: Simple Face Mask and LMA  Additional Equipment:   Intra-op Plan:   Post-operative Plan: Extubation in OR  Informed Consent: I have reviewed the patients History and Physical, chart, labs and discussed the procedure including the risks, benefits and alternatives for the proposed anesthesia with the patient or authorized representative who has indicated his/her understanding and acceptance.     Plan Discussed with:   Anesthesia Plan Comments:         Anesthesia Quick Evaluation

## 2014-01-31 NOTE — Anesthesia Postprocedure Evaluation (Signed)
  Anesthesia Post-op Note  Patient: Brad Mcintosh  Procedure(s) Performed: Procedure(s): INSERTION OF ARTERIOVENOUS (AV) GORE-TEX GRAFT ARM- RIGHT FOREARM (Right)  Patient Location: PACU  Anesthesia Type:General  Level of Consciousness: awake, alert , oriented and patient cooperative  Airway and Oxygen Therapy: Patient Spontanous Breathing  Post-op Pain: none  Post-op Assessment: Post-op Vital signs reviewed, Patient's Cardiovascular Status Stable, Respiratory Function Stable, Patent Airway, No signs of Nausea or vomiting and Pain level controlled  Post-op Vital Signs: Reviewed and stable  Last Vitals:  Filed Vitals:   01/31/14 1106  BP: 125/73  Pulse:   Temp: 37.1 C  Resp:     Complications: No apparent anesthesia complications

## 2014-01-31 NOTE — Anesthesia Procedure Notes (Signed)
Procedure Name: LMA Insertion Date/Time: 01/31/2014 8:38 AM Performed by: Izora Gala Pre-anesthesia Checklist: Patient identified, Emergency Drugs available, Suction available and Patient being monitored Oxygen Delivery Method: Circle system utilized Preoxygenation: Pre-oxygenation with 100% oxygen Intubation Type: IV induction Ventilation: Mask ventilation without difficulty LMA: LMA inserted LMA Size: 5.0 Number of attempts: 1 Placement Confirmation: positive ETCO2 and breath sounds checked- equal and bilateral Tube secured with: Tape Dental Injury: Teeth and Oropharynx as per pre-operative assessment

## 2014-01-31 NOTE — H&P (Signed)
Brief History and Physical  History of Present Illness  Brad Mcintosh is a 79 y.o. male who presents with chief complaint: ESRD and need for permanent access.  This patient was seen by Dr. Trula Slade in February and a venogram was completed which demonstrated poor anetcubital vein.  The patient was scheduled for right arm arteriovenous graft placement.  Due to scheduling conflicts and Brilinta use, the patient's case was postponed.  He presents today for right arm arteriovenous graft placement.  Past Medical History  Diagnosis Date  . Hypertension   . Hyperlipidemia   . Cancer     prostate  . Gout   . Anemia     Iron deficiency  . Thyroid disease   . Malnutrition   . Atrial fibrillation     paroxysmal  . Chronic kidney disease     Pt has HD on TUESDAY, THURSDAY and SATURDAY  at [facility]  . Peripheral vascular disease   . Coronary artery disease   . Shortness of breath   . GERD (gastroesophageal reflux disease)   . Aortic stenosis     moderate AS by 10/2013 echo  . Myocardial infarction 10/2013  . Pneumonia 2015  . History of blood transfusion     Past Surgical History  Procedure Laterality Date  . Av fistula placement  06/2006    left forearm  Pt denies  . Amputation      right finger  . Incise and drain abcess      chronic complex infection right long finger  . Insertion of dialysis catheter  05/30/2010    right internal jugular Palindrome catheter  . Aortogram  06/01/11    w/ runoff  . Tee without cardioversion N/A 10/25/2013    Procedure: TRANSESOPHAGEAL ECHOCARDIOGRAM (TEE);  Surgeon: [name], MD;  Location: [place];  Service: Cardiovascular;  Laterality: N/A;  . Insertion of dialysis catheter N/A 10/25/2013    Procedure: INSERTION OF DIALYSIS CATHETER;  Surgeon: Elam Dutch, MD;  Location: [place];  Service: Vascular;  Laterality: N/A;  Attempted both right and left neck, procedure unsucessful.  . Cardiac catheterization    . Toe amputation     bil feet  . Coronary angioplasty  11/09/13    LAD Stented    History   Social History  . Marital Status: Widowed    Spouse Name: N/A    Number of Children: N/A  . Years of Education: N/A   Occupational History  . Not on file.   Social History Main Topics  . Smoking status: Former Smoker -- 0.50 packs/day    Types: Cigarettes    Quit date: 02/17/1972  . Smokeless tobacco: Never Used  . Alcohol Use: No  . Drug Use: No  . Sexual Activity: Not on file   Other Topics Concern  . Not on file   Social History Narrative  . No narrative on file    Family History  Problem Relation Age of Onset  . Hypertension Mother   . Asthma Father   . Cancer Sister     breast  . Kidney disease Brother   . Diabetes Brother     No current facility-administered medications on file prior to encounter.   Current Outpatient Prescriptions on File Prior to Encounter  Medication Sig Dispense Refill  . acetaminophen (TYLENOL) 500 MG tablet Take 1 tablet (500 mg total) by mouth every 6 (six) hours as needed for moderate pain.  30 tablet  0  .  aspirin EC 81 MG EC tablet Take 1 tablet (81 mg total) by mouth daily.      . calcium acetate (PHOSLO) 667 MG capsule Take 667 mg by mouth 3 (three) times daily with meals.       . citalopram (CELEXA) 20 MG tablet Take 20 mg by mouth at bedtime. Depression      . cloNIDine (CATAPRES) 0.3 MG tablet Take 1 tablet (0.3 mg total) by mouth at bedtime.      . colchicine 0.6 MG tablet Take 0.6 mg by mouth daily as needed (gout).       . cyclobenzaprine (FLEXERIL) 10 MG tablet Take 10 mg by mouth 3 (three) times daily as needed for muscle spasms.      . cyclobenzaprine (FLEXERIL) 10 MG tablet Take 10 mg by mouth 3 (three) times daily as needed for muscle spasms.      Marland Kitchen gabapentin (NEURONTIN) 300 MG capsule Take 300 mg by mouth at bedtime.      Marland Kitchen HYDROcodone-acetaminophen (NORCO/VICODIN) 5-325 MG per tablet Take 1 tablet by mouth every 6 (six) hours as needed for  moderate pain.       . metoprolol tartrate (LOPRESSOR) 12.5 mg TABS tablet Take 12.5 mg by mouth 2 (two) times daily.      . multivitamin (RENA-VIT) TABS tablet Take 1 tablet by mouth daily.      . ondansetron (ZOFRAN-ODT) 4 MG disintegrating tablet Take 4 mg by mouth every 8 (eight) hours as needed for nausea or vomiting.      . pantoprazole (PROTONIX) 40 MG tablet Take 40 mg by mouth daily.      . simvastatin (ZOCOR) 10 MG tablet Take 10 mg by mouth at bedtime.      . Ticagrelor (BRILINTA) 90 MG TABS tablet Take 1 tablet (90 mg total) by mouth 2 (two) times daily.  60 tablet  12    Allergies  Allergen Reactions  . Altace [Ramipril] Other (See Comments) and Cough    Also renal failure   Review of Systems: As listed above, otherwise negative.  Physical Examination  Filed Vitals:   01/31/14 0637  BP: 152/82  Pulse: 96  Temp: 98.1 F (36.7 C)  TempSrc: Oral  Resp: 16  Height: 5\' 10"  (1.778 m)  Weight: 85 lb 5 oz (38.697 kg)  SpO2: 100%    General: A&O x 3, WDWN  Pulmonary: Sym exp, good air movt, CTAB, no rales, rhonchi, & wheezing  Cardiac: RRR, Nl S1, S2, no Murmurs, rubs or gallops  Gastrointestinal: soft, NTND, -G/R, - HSM, - masses, - CVAT B  Musculoskeletal: M/S 5/5 throughout  , Extremities without ischemic changes , L femoral TDC  Laboratory See iStat  Medical Decision Making  Brad Mcintosh is a 79 y.o. male who presents with: ESRD requiring HD with need for permanent access.   The patient is scheduled for: right arm AVG placement.  Based upon my evaluation of his venogram, I don't think he is a candidate for FA AVG, so a UA AVG will likely be necessary.  Risk, benefits, and alternatives to access surgery were discussed.  The patient is aware the risks include but are not limited to: bleeding, infection, steal syndrome, nerve damage, ischemic monomelic neuropathy, failure to mature, and need for additional procedures.  The patient is aware of the risks and  agrees to proceed.  Adele Barthel, MD [facility] [place] Office: [phone] Pager: [phone]  01/31/2014, 7:49 AM

## 2014-01-31 NOTE — Op Note (Signed)
OPERATIVE NOTE   PROCEDURE:  right upper arm arteriovenous graft  PRE-OPERATIVE DIAGNOSIS: end stage renal disease   POST-OPERATIVE DIAGNOSIS: same as above   SURGEON: Adele Barthel, MD  ASSISTANT(S): Gerri Lins, PAC   ANESTHESIA: general  ESTIMATED BLOOD LOSS: 50 cc  FINDING(S): 1. Atherosclerotic right brachial artery with only monophasic radial signal at end of case 2. Palpable thrill in right upper arm arteriovenous graft  3. Weak monophasic radial signal <50% augmentation with outflow compression  SPECIMEN(S):  none  INDICATIONS:   Brad Mcintosh is a 79 y.o. male who presents with end stage renal disease.  The patient previously had undergone venography which demonstrated that his only permanent access option would be a right arm upper arm arteriovenous graft.  Risk, benefits, and alternatives to access surgery were discussed.  The patient is aware the risks include but are not limited to: bleeding, infection, steal syndrome, nerve damage, ischemic monomelic neuropathy, failure to mature, and need for additional procedures.  The patient is aware of the risks and elects to proceed forward.  DESCRIPTION: After full informed written consent was obtained from the patient, the patient was brought back to the operating room and placed supine upon the operating table.  The patient was given IV antibiotics prior to proceeding.  After obtaining adequate sedation, the patient was prepped and draped in standard fashion for a right arm access procedure.  I turned my attention first to the antecubitum.  Under ultrasound guidance, I identified the location of the brachial artery and marked it on the skin.  I then examined the bicipital groove and identified the high brachial vein and marked it on the skin.  I made an incision over the brachial artery, and dissected down through the subcutaneous tissue to the fascia carefully and was able to dissect out the brachial artery.  The artery was  about 4 mm externally without obvious atherosclerotic changes.  It was controlled proximally and distally with vessel loops and then I turned my attention to the high bicipital groove.  I made an incision at the previously anesthetized site, dissected down through the subcutaneous tissue and fascia until I reached the high rachial vein.  Externally, it appeared to be 7-8 mm in diameter.  I then dissected this vein proximally and distal.  I took a metal Gore tunneler and dissected from the antecubital incision up to the high bicipital incision.  Then I delivered the 4 x 7-mm stretch Gore-Tex graft, through this metal tunneler and then pulled out the metal tunneler leaving the graft in place.  The 4-mm end was left on the antecubital side and the 7 mm toward the axillary.  I then gave the patient 5000 units of heparin to gain some anticoagulation.  After waiting 3 minutes, I placed the brachial artery under tension proximally and distally with vessel loops, made an arteriotomy and extended it with a Potts scissor to exactly 4 mm.  The artery could not be controlled fully with tension, so I had to clamp both ends.  I sewed the 4-mm end of the graft to this arteriotomy with a running stitch of 6-0 Prolene.  At this point, then I completed the anastomosis in the usual fashion.  I released the vessel loops on the inflow and allowed the artery to decompress through the graft. There was good bleeding through this graft.  I clamped the graft near its arterial anastomosis and sucked out all the blood in the graft and loaded the graft  with heparinized saline.  At this point, I pulled the graft to appropriate length and reset my exposure of the high brachial vein.  Due to the anatomy of the vein this patient, I elected to proceed with an end to side anstomosis.  I clamped the vein proximally and distally and made a venotomy which I extended.  I spatulated the graft to facilitate an end-to-side anastomosis.  In the process of  spatulating, I cut the graft to appropriate length for this anastomosis.  This graft was sewn to the vein in an end-to-end configuration with a 6-0 Prolene.  Prior to completing this anastomosis, I allowed the vein to back bleed and then I also allowed the artery to bleed in an antegrade fashion.  There was good bleeding from the vein and the graft.  I completed this anastomosis in the usual fashion, and then irrigated out the high bicipital exposure and then placed thrombin and Gelfoam.  I then turned my attention back to the antecubitum.  The distal radial signal was weakly dopplerable.  I clamped the graft near the anastomosis to check the radial signal and it was monophasic with significant augmentation.  I felt this was acceptable but due to the pre-existing atherosclerosis, this patient was at risk for steal.  Using a continuous Doppler, the brachial artery proximally and distally had biphasic waveforms.  The venous outflow had a flow signature consistent with widely patent arterial venous graft.  At this point, I washed out the antecubital incision.  There was no more active bleeding.  The subcutaneous tissue was reapproximated with a running stitch of 3-0 Vicryl.  The skin was then reapproximated with a running subcuticular 4-0 Monocryl.  The skin was then cleaned, dried, and Dermabond used to reinforce the skin closure.  We then turned our attention to the high bicipital exposure.  I removed all the thrombin and gelfoam and washed out the wound.  There was no more active bleeding.  The subcutaneous tissue was repaired with running stitch of 3-0 Vicryl.  The skin was then reapproximated with running subcuticular 4-0 Monocryl.  The skin was then cleaned, dried, and then the skin closure was reinforced with Dermabond.    COMPLICATIONS: none  CONDITION: stable   Adele Barthel, MD [facility] [place] Office: [phone] Pager: [phone]  01/31/2014, 10:51 AM

## 2014-02-01 ENCOUNTER — Encounter (HOSPITAL_COMMUNITY): Payer: Self-pay | Admitting: Vascular Surgery

## 2014-02-01 ENCOUNTER — Telehealth: Payer: Self-pay | Admitting: Vascular Surgery

## 2014-02-01 NOTE — Telephone Encounter (Addendum)
Message copied by Doristine Section on Thu Feb 01, 2014  4:05 PM ------      Message from: Brad Mcintosh      Created: Wed Jan 31, 2014 11:52 AM      Regarding: Brad Mcintosh; needs f/u appt.                    ----- Message -----         From: Conrad Bascom, MD         Sent: 01/31/2014  11:01 AM           To: [address]            Brad Mcintosh      [id number]      09-14-1935            PROCEDURE:  right upper arm arteriovenous graft            Asst: Gerri Lins, [facility]             Follow-up: 2 weeks       ------  notified patient of fu appt. with dr. [name] on 02-16-14 at 3:45

## 2014-02-15 ENCOUNTER — Encounter: Payer: Self-pay | Admitting: Vascular Surgery

## 2014-02-16 ENCOUNTER — Ambulatory Visit (INDEPENDENT_AMBULATORY_CARE_PROVIDER_SITE_OTHER): Payer: Medicare Other | Admitting: Vascular Surgery

## 2014-02-16 ENCOUNTER — Encounter: Payer: Self-pay | Admitting: Vascular Surgery

## 2014-02-16 VITALS — BP 110/70 | HR 93 | Ht 70.0 in | Wt 189.0 lb

## 2014-02-16 DIAGNOSIS — N186 End stage renal disease: Secondary | ICD-10-CM

## 2014-02-16 NOTE — Progress Notes (Signed)
    Postoperative Access Visit   History of Present Illness  Brad Mcintosh is a 79 y.o. year old male who presents for postoperative follow-up for: RUA AVG (Date: 01/31/14).  The patient's wounds are healed.  The patient notes no steal symptoms.  The patient is able to complete their activities of daily living.  The patient's current symptoms are: none.  For VQI Use Only  PRE-ADM LIVING: Home  AMB STATUS: Ambulatory  Physical Examination Filed Vitals:   02/16/14 0846  BP: 110/70  Pulse: 93    RUE: Incision is healed, skin feels warm, hand grip is 5/5, sensation in digits is intact, palpable thrill, bruit can be auscultated , faintly palpable radial pulse, hematoma over arterial anastomosis (evaluated with sonosite)  Medical Decision Making  Brad Mcintosh is a 79 y.o. year old male who presents s/p RUA AVG.  The patient's access is ready for use.  The patient's tunneled dialysis catheter can be removed after two successful cannulations and completed dialysis treatments.  Thank you for allowing [place] to participate in this patient's care.  Adele Barthel, MD [facility] [place] Office: [phone] Pager: [phone]  02/16/2014, 9:25 AM

## 2014-02-18 ENCOUNTER — Emergency Department (HOSPITAL_COMMUNITY)
Admission: EM | Admit: 2014-02-18 | Discharge: 2014-02-18 | Disposition: A | Discharge status: Home / Self Care | Payer: Medicare Other | Attending: Emergency Medicine | Admitting: Emergency Medicine

## 2014-02-18 ENCOUNTER — Encounter (HOSPITAL_COMMUNITY): Payer: Self-pay | Admitting: Emergency Medicine

## 2014-02-18 DIAGNOSIS — M109 Gout, unspecified: Secondary | ICD-10-CM | POA: Insufficient documentation

## 2014-02-18 DIAGNOSIS — Z87891 Personal history of nicotine dependence: Secondary | ICD-10-CM | POA: Insufficient documentation

## 2014-02-18 DIAGNOSIS — Z992 Dependence on renal dialysis: Secondary | ICD-10-CM | POA: Insufficient documentation

## 2014-02-18 DIAGNOSIS — Z79899 Other long term (current) drug therapy: Secondary | ICD-10-CM | POA: Insufficient documentation

## 2014-02-18 DIAGNOSIS — I4891 Unspecified atrial fibrillation: Secondary | ICD-10-CM | POA: Insufficient documentation

## 2014-02-18 DIAGNOSIS — I739 Peripheral vascular disease, unspecified: Secondary | ICD-10-CM | POA: Insufficient documentation

## 2014-02-18 DIAGNOSIS — Z862 Personal history of diseases of the blood and blood-forming organs and certain disorders involving the immune mechanism: Secondary | ICD-10-CM | POA: Insufficient documentation

## 2014-02-18 DIAGNOSIS — Z9861 Coronary angioplasty status: Secondary | ICD-10-CM | POA: Insufficient documentation

## 2014-02-18 DIAGNOSIS — Z7982 Long term (current) use of aspirin: Secondary | ICD-10-CM | POA: Insufficient documentation

## 2014-02-18 DIAGNOSIS — I120 Hypertensive chronic kidney disease with stage 5 chronic kidney disease or end stage renal disease: Secondary | ICD-10-CM | POA: Insufficient documentation

## 2014-02-18 DIAGNOSIS — L299 Pruritus, unspecified: Secondary | ICD-10-CM | POA: Insufficient documentation

## 2014-02-18 DIAGNOSIS — N186 End stage renal disease: Secondary | ICD-10-CM | POA: Insufficient documentation

## 2014-02-18 DIAGNOSIS — K219 Gastro-esophageal reflux disease without esophagitis: Secondary | ICD-10-CM | POA: Insufficient documentation

## 2014-02-18 DIAGNOSIS — L282 Other prurigo: Secondary | ICD-10-CM

## 2014-02-18 DIAGNOSIS — I252 Old myocardial infarction: Secondary | ICD-10-CM | POA: Insufficient documentation

## 2014-02-18 DIAGNOSIS — Z8546 Personal history of malignant neoplasm of prostate: Secondary | ICD-10-CM | POA: Insufficient documentation

## 2014-02-18 DIAGNOSIS — Z8639 Personal history of other endocrine, nutritional and metabolic disease: Secondary | ICD-10-CM | POA: Insufficient documentation

## 2014-02-18 DIAGNOSIS — E785 Hyperlipidemia, unspecified: Secondary | ICD-10-CM | POA: Insufficient documentation

## 2014-02-18 DIAGNOSIS — R21 Rash and other nonspecific skin eruption: Secondary | ICD-10-CM | POA: Insufficient documentation

## 2014-02-18 DIAGNOSIS — Z7901 Long term (current) use of anticoagulants: Secondary | ICD-10-CM | POA: Insufficient documentation

## 2014-02-18 MED ORDER — PREDNISONE (PAK) 10 MG PO TABS: ORAL_TABLET | Freq: Every day | ORAL | Status: DC

## 2014-02-18 MED ORDER — HYDROXYZINE HCL 25 MG PO TABS: 25.0000 mg | ORAL_TABLET | Freq: Four times a day (QID) | ORAL | Status: AC | PRN

## 2014-02-18 NOTE — ED Provider Notes (Signed)
Medical screening examination/treatment/procedure(s) were conducted as a shared visit with non-physician practitioner(s) and myself.  I personally evaluated the patient during the encounter.   EKG Interpretation None      Patient here with whole-body rash x1 month. It is pruritic. On exam he has no signs of infection at this time. We'll treat with Vistaril and patient instructed to followup with dermatologist of his choice  Leota Jacobsen, MD 02/18/14 [phone]

## 2014-02-18 NOTE — ED Provider Notes (Signed)
CSN: [id number]     Arrival date & time 02/18/14  0754 History   First MD Initiated Contact with Patient 02/18/14 [phone]     Chief Complaint  Patient presents with  . Rash     (Consider location/radiation/quality/duration/timing/severity/associated sxs/prior Treatment) The history is provided by the patient.    Patient presents with pruritic rash over bilateral arms and legs, somewhat on abdomen and upper back x 1 month.  Denies any itching or swelling in his mouth or throat, difficulty swallowing or breathing.  Denies seeing any insects, any environmental exposures, and change in personal care or household products, any new clothes/bedding/furniture.  Denies any new medications.  Is getting dialysis T,Th,Sa without any problems.  Last dialysis yesterday. Denies fevers, pain anywhere.    Past Medical History  Diagnosis Date  . Hypertension   . Hyperlipidemia   . Cancer     prostate  . Gout   . Anemia     Iron deficiency  . Thyroid disease   . Malnutrition   . Atrial fibrillation     paroxysmal  . Chronic kidney disease     Pt has HD on TUESDAY, THURSDAY and SATURDAY  at [facility]  . Peripheral vascular disease   . Coronary artery disease   . Shortness of breath   . GERD (gastroesophageal reflux disease)   . Aortic stenosis     moderate AS by 10/2013 echo  . Myocardial infarction 10/2013  . Pneumonia 2015  . History of blood transfusion    Past Surgical History  Procedure Laterality Date  . Av fistula placement  06/2006    left forearm  Pt denies  . Amputation      right finger  . Incise and drain abcess      chronic complex infection right long finger  . Insertion of dialysis catheter  05/30/2010    right internal jugular Palindrome catheter  . Aortogram  06/01/11    w/ runoff  . Tee without cardioversion N/A 10/25/2013    Procedure: TRANSESOPHAGEAL ECHOCARDIOGRAM (TEE);  Surgeon: [name], MD;  Location: [place];  Service: Cardiovascular;  Laterality: N/A;   . Insertion of dialysis catheter N/A 10/25/2013    Procedure: INSERTION OF DIALYSIS CATHETER;  Surgeon: Elam Dutch, MD;  Location: [place];  Service: Vascular;  Laterality: N/A;  Attempted both right and left neck, procedure unsucessful.  . Cardiac catheterization    . Toe amputation      bil feet  . Coronary angioplasty  11/09/13    LAD Stented  . Av fistula placement Right 01/31/2014    Procedure: INSERTION OF ARTERIOVENOUS (AV) GORE-TEX GRAFT ARM- RIGHT FOREARM;  Surgeon: Conrad Epworth, MD;  Location: [facility];  Service: Vascular;  Laterality: Right;   Family History  Problem Relation Age of Onset  . Hypertension Mother   . Asthma Father   . Cancer Sister     breast  . Kidney disease Brother   . Diabetes Brother    History  Substance Use Topics  . Smoking status: Former Smoker -- 0.50 packs/day    Types: Cigarettes    Quit date: 02/17/1972  . Smokeless tobacco: Never Used  . Alcohol Use: No    Review of Systems  Constitutional: Negative for fever.  Respiratory: Negative for cough and shortness of breath.   Cardiovascular: Negative for chest pain.  Gastrointestinal: Negative for nausea, vomiting, abdominal pain and diarrhea.  Skin: Positive for rash. Negative for color change.  All  other systems reviewed and are negative.     Allergies  Altace  Home Medications   Prior to Admission medications   Medication Sig Start Date End Date Taking? Authorizing Provider  acetaminophen (TYLENOL) 500 MG tablet Take 1 tablet (500 mg total) by mouth every 6 (six) hours as needed for moderate pain. 11/10/13   [name], MD  aspirin EC 81 MG EC tablet Take 1 tablet (81 mg total) by mouth daily. 10/26/13   Shanker Kristeen Mans, MD  calcium acetate (PHOSLO) 667 MG capsule Take 667 mg by mouth 3 (three) times daily with meals.  06/20/12   Historical Provider, MD  citalopram (CELEXA) 20 MG tablet Take 20 mg by mouth at bedtime. Depression    Historical Provider, MD  cloNIDine (CATAPRES)  0.3 MG tablet Take 1 tablet (0.3 mg total) by mouth at bedtime. 11/10/13   [name], MD  colchicine 0.6 MG tablet Take 0.6 mg by mouth daily as needed (gout).     Historical Provider, MD  cyclobenzaprine (FLEXERIL) 10 MG tablet Take 10 mg by mouth 3 (three) times daily as needed for muscle spasms.    Historical Provider, MD  enoxaparin (LOVENOX) 80 MG/0.8ML injection  01/25/14   Historical Provider, MD  gabapentin (NEURONTIN) 100 MG capsule  01/22/14   Historical Provider, MD  gabapentin (NEURONTIN) 300 MG capsule Take 300 mg by mouth at bedtime.    Historical Provider, MD  HYDROcodone-acetaminophen (NORCO/VICODIN) 5-325 MG per tablet Take 1 tablet by mouth every 6 (six) hours as needed for moderate pain.     Historical Provider, MD  isosorbide mononitrate (IMDUR) 60 MG 24 hr tablet  11/22/13   Historical Provider, MD  metoprolol succinate (TOPROL-XL) 100 MG 24 hr tablet  01/05/14   Historical Provider, MD  metoprolol tartrate (LOPRESSOR) 12.5 mg TABS tablet Take 12.5 mg by mouth 2 (two) times daily. 11/10/13   [name], MD  multivitamin (RENA-VIT) TABS tablet Take 1 tablet by mouth daily.    Historical Provider, MD  ondansetron (ZOFRAN-ODT) 4 MG disintegrating tablet Take 4 mg by mouth every 8 (eight) hours as needed for nausea or vomiting.    Historical Provider, MD  oxyCODONE-acetaminophen (PERCOCET/ROXICET) 5-325 MG per tablet Take 1 tablet by mouth every 6 (six) hours as needed. 01/31/14   Ulyses Amor, PA-C  pantoprazole (PROTONIX) 40 MG tablet Take 40 mg by mouth daily.    Historical Provider, MD  simvastatin (ZOCOR) 10 MG tablet Take 10 mg by mouth at bedtime.    Historical Provider, MD  Ticagrelor (BRILINTA) 90 MG TABS tablet Take 1 tablet (90 mg total) by mouth 2 (two) times daily. 11/10/13   [name], MD   BP 115/66  Pulse 96  Temp(Src) 97.4 F (36.3 C) (Oral)  Resp 20  SpO2 97% Physical Exam  Nursing note and vitals reviewed. Constitutional: He appears well-developed  and well-nourished. No distress.  HENT:  Head: Normocephalic and atraumatic.  Neck: Neck supple.  Cardiovascular: Normal rate and regular rhythm.   Fistula right upper extremity with thrill   Pulmonary/Chest: Effort normal and breath sounds normal. No respiratory distress. He has no wheezes. He has no rales.  Abdominal: Soft. He exhibits no distension. There is no tenderness. There is no rebound and no guarding.  Neurological: He is alert. He exhibits normal muscle tone.  Skin: Rash noted. He is not diaphoretic.  Multiple scabbed lesions throughout forearms, shins, few on abdomen.  Scratch marks on upper back.  No new lesions noted.  No erythema, edema, warmth, tenderness, or discharge.      ED Course  Procedures (including critical care time) Labs Review Labs Reviewed - No data to display  Imaging Review No results found.   EKG Interpretation None     8:41 AM Discussed pt with Dr Zenia Resides who will also see the patient.     MDM   Final diagnoses:  Pruritic rash   Pt with diffuse pruritic rash that is chronic, no new lesions.  No e/o superinfection.  Unclear etiology.  Discussed pt with Dr Zenia Resides who recommended d/c home with steroids and vistaril, derm follow up.   Discussed findings, treatment, and follow up  with patient.  Pt given return precautions.  Pt verbalizes understanding and agrees with plan.       Clayton Bibles, PA-C 02/18/14 1158

## 2014-02-18 NOTE — ED Notes (Signed)
Pt had itchy rash all over body that he reports has been there for about a month.

## 2014-02-18 NOTE — Discharge Instructions (Signed)
Read the information below.  Use the prescribed medication as directed.  Please discuss all new medications with your pharmacist.  You may return to the Emergency Department at any time for worsening condition or any new symptoms that concern you.  If you develop redness, swelling, pus draining from the wound, or fevers greater than 100.4, return to the ER immediately for a recheck.     Pruritus  Pruritis is an itch. There are many different problems that can cause an itch. Dry skin is one of the most common causes of itching. Most cases of itching do not require medical attention.  HOME CARE INSTRUCTIONS  Make sure your skin is moistened on a regular basis. A moisturizer that contains petroleum jelly is best for keeping moisture in your skin. If you develop a rash, you may try the following for relief:   Use corticosteroid cream.  Apply cool compresses to the affected areas.  Bathe with Epsom salts or baking soda in the bathwater.  Soak in colloidal oatmeal baths. These are available at your pharmacy.  Apply baking soda paste to the rash. Stir water into baking soda until it reaches a paste-like consistency.  Use an anti-itch lotion.  Take over-the-counter diphenhydramine medicine by mouth as the instructions direct.  Avoid scratching. Scratching may cause the rash to become infected. If itching is very bad, your caregiver may suggest prescription lotions or creams to lessen your symptoms.  Avoid hot showers, which can make itching worse. A cold shower may help with itching as long as you use a moisturizer after the shower. SEEK MEDICAL CARE IF: The itching does not go away after several days. Document Released: 06/10/2011 Document Revised: 12/21/2011 Document Reviewed: 06/10/2011 [facility] Patient Information 2014 [place], [place].  Rash A rash is a change in the color or texture of your skin. There are many different types of rashes. You may have other problems that accompany your  rash. CAUSES   Infections.  Allergic reactions. This can include allergies to pets or foods.  Certain medicines.  Exposure to certain chemicals, soaps, or cosmetics.  Heat.  Exposure to poisonous plants.  Tumors, both cancerous and noncancerous. SYMPTOMS   Redness.  Scaly skin.  Itchy skin.  Dry or cracked skin.  Bumps.  Blisters.  Pain. DIAGNOSIS  Your caregiver may do a physical exam to determine what type of rash you have. A skin sample (biopsy) may be taken and examined under a microscope. TREATMENT  Treatment depends on the type of rash you have. Your caregiver may prescribe certain medicines. For serious conditions, you may need to see a skin doctor (dermatologist). HOME CARE INSTRUCTIONS   Avoid the substance that caused your rash.  Do not scratch your rash. This can cause infection.  You may take cool baths to help stop itching.  Only take over-the-counter or prescription medicines as directed by your caregiver.  Keep all follow-up appointments as directed by your caregiver. SEEK IMMEDIATE MEDICAL CARE IF:  You have increasing pain, swelling, or redness.  You have a fever.  You have new or severe symptoms.  You have body aches, diarrhea, or vomiting.  Your rash is not better after 3 days. MAKE SURE YOU:  Understand these instructions.  Will watch your condition.  Will get help right away if you are not doing well or get worse. Document Released: 09/18/2002 Document Revised: 12/21/2011 Document Reviewed: 07/13/2011 [facility] Patient Information 2014 [place], [place].

## 2014-02-18 NOTE — ED Notes (Signed)
MD at bedside. 

## 2014-02-18 NOTE — ED Notes (Signed)
Pt states rash to arms and back.  States it is itching and has been there for a week.

## 2014-02-20 NOTE — ED Provider Notes (Signed)
Medical screening examination/treatment/procedure(s) were performed by non-physician practitioner and as supervising physician I was immediately available for consultation/collaboration.  Leota Jacobsen, MD 02/20/14 [phone]

## 2014-08-21 ENCOUNTER — Encounter: Payer: Self-pay | Admitting: Family

## 2014-08-22 ENCOUNTER — Ambulatory Visit: Payer: Medicare Other | Admitting: Family

## 2014-08-22 ENCOUNTER — Encounter (HOSPITAL_COMMUNITY): Payer: Medicare Other

## 2014-09-11 ENCOUNTER — Encounter: Payer: Self-pay | Admitting: Physician Assistant

## 2014-09-14 ENCOUNTER — Encounter: Payer: Self-pay | Admitting: *Deleted

## 2014-09-15 ENCOUNTER — Emergency Department (HOSPITAL_COMMUNITY): Payer: Medicare Other

## 2014-09-15 ENCOUNTER — Inpatient Hospital Stay (HOSPITAL_COMMUNITY): Payer: Medicare Other

## 2014-09-15 ENCOUNTER — Inpatient Hospital Stay (HOSPITAL_COMMUNITY)
Admission: EM | Admit: 2014-09-15 | Discharge: 2014-10-12 | DRG: 064 | Disposition: E | Payer: Medicare Other | Attending: Cardiovascular Disease | Admitting: Cardiovascular Disease

## 2014-09-15 ENCOUNTER — Encounter (HOSPITAL_COMMUNITY): Payer: Self-pay | Admitting: *Deleted

## 2014-09-15 DIAGNOSIS — B9561 Methicillin susceptible Staphylococcus aureus infection as the cause of diseases classified elsewhere: Secondary | ICD-10-CM | POA: Diagnosis not present

## 2014-09-15 DIAGNOSIS — R1313 Dysphagia, pharyngeal phase: Secondary | ICD-10-CM | POA: Diagnosis not present

## 2014-09-15 DIAGNOSIS — Z6825 Body mass index (BMI) 25.0-25.9, adult: Secondary | ICD-10-CM | POA: Diagnosis not present

## 2014-09-15 DIAGNOSIS — I350 Nonrheumatic aortic (valve) stenosis: Secondary | ICD-10-CM | POA: Diagnosis not present

## 2014-09-15 DIAGNOSIS — F0390 Unspecified dementia without behavioral disturbance: Secondary | ICD-10-CM | POA: Diagnosis present

## 2014-09-15 DIAGNOSIS — I739 Peripheral vascular disease, unspecified: Secondary | ICD-10-CM | POA: Diagnosis not present

## 2014-09-15 DIAGNOSIS — Z23 Encounter for immunization: Secondary | ICD-10-CM | POA: Diagnosis not present

## 2014-09-15 DIAGNOSIS — Z992 Dependence on renal dialysis: Secondary | ICD-10-CM

## 2014-09-15 DIAGNOSIS — G4731 Primary central sleep apnea: Secondary | ICD-10-CM | POA: Diagnosis not present

## 2014-09-15 DIAGNOSIS — Z515 Encounter for palliative care: Secondary | ICD-10-CM | POA: Insufficient documentation

## 2014-09-15 DIAGNOSIS — I214 Non-ST elevation (NSTEMI) myocardial infarction: Secondary | ICD-10-CM | POA: Diagnosis present

## 2014-09-15 DIAGNOSIS — Z789 Other specified health status: Secondary | ICD-10-CM

## 2014-09-15 DIAGNOSIS — N186 End stage renal disease: Secondary | ICD-10-CM | POA: Diagnosis present

## 2014-09-15 DIAGNOSIS — R21 Rash and other nonspecific skin eruption: Secondary | ICD-10-CM | POA: Diagnosis present

## 2014-09-15 DIAGNOSIS — R29810 Facial weakness: Secondary | ICD-10-CM | POA: Diagnosis present

## 2014-09-15 DIAGNOSIS — N2581 Secondary hyperparathyroidism of renal origin: Secondary | ICD-10-CM | POA: Diagnosis not present

## 2014-09-15 DIAGNOSIS — R0600 Dyspnea, unspecified: Secondary | ICD-10-CM

## 2014-09-15 DIAGNOSIS — Z66 Do not resuscitate: Secondary | ICD-10-CM | POA: Diagnosis not present

## 2014-09-15 DIAGNOSIS — G9340 Encephalopathy, unspecified: Secondary | ICD-10-CM | POA: Diagnosis present

## 2014-09-15 DIAGNOSIS — E780 Pure hypercholesterolemia: Secondary | ICD-10-CM | POA: Diagnosis present

## 2014-09-15 DIAGNOSIS — R570 Cardiogenic shock: Secondary | ICD-10-CM | POA: Diagnosis present

## 2014-09-15 DIAGNOSIS — I120 Hypertensive chronic kidney disease with stage 5 chronic kidney disease or end stage renal disease: Secondary | ICD-10-CM | POA: Diagnosis present

## 2014-09-15 DIAGNOSIS — D6959 Other secondary thrombocytopenia: Secondary | ICD-10-CM | POA: Diagnosis not present

## 2014-09-15 DIAGNOSIS — T380X5A Adverse effect of glucocorticoids and synthetic analogues, initial encounter: Secondary | ICD-10-CM | POA: Diagnosis present

## 2014-09-15 DIAGNOSIS — D638 Anemia in other chronic diseases classified elsewhere: Secondary | ICD-10-CM | POA: Diagnosis not present

## 2014-09-15 DIAGNOSIS — R001 Bradycardia, unspecified: Secondary | ICD-10-CM | POA: Diagnosis not present

## 2014-09-15 DIAGNOSIS — I213 ST elevation (STEMI) myocardial infarction of unspecified site: Secondary | ICD-10-CM | POA: Diagnosis present

## 2014-09-15 DIAGNOSIS — Z8546 Personal history of malignant neoplasm of prostate: Secondary | ICD-10-CM

## 2014-09-15 DIAGNOSIS — Z452 Encounter for adjustment and management of vascular access device: Secondary | ICD-10-CM

## 2014-09-15 DIAGNOSIS — E785 Hyperlipidemia, unspecified: Secondary | ICD-10-CM | POA: Diagnosis not present

## 2014-09-15 DIAGNOSIS — Z7902 Long term (current) use of antithrombotics/antiplatelets: Secondary | ICD-10-CM | POA: Diagnosis not present

## 2014-09-15 DIAGNOSIS — I6340 Cerebral infarction due to embolism of unspecified cerebral artery: Secondary | ICD-10-CM | POA: Diagnosis present

## 2014-09-15 DIAGNOSIS — R579 Shock, unspecified: Secondary | ICD-10-CM | POA: Insufficient documentation

## 2014-09-15 DIAGNOSIS — R410 Disorientation, unspecified: Secondary | ICD-10-CM

## 2014-09-15 DIAGNOSIS — R64 Cachexia: Secondary | ICD-10-CM | POA: Diagnosis present

## 2014-09-15 DIAGNOSIS — I480 Paroxysmal atrial fibrillation: Secondary | ICD-10-CM | POA: Diagnosis present

## 2014-09-15 DIAGNOSIS — R0681 Apnea, not elsewhere classified: Secondary | ICD-10-CM

## 2014-09-15 DIAGNOSIS — R7881 Bacteremia: Secondary | ICD-10-CM | POA: Diagnosis not present

## 2014-09-15 DIAGNOSIS — Z4659 Encounter for fitting and adjustment of other gastrointestinal appliance and device: Secondary | ICD-10-CM

## 2014-09-15 DIAGNOSIS — I5020 Unspecified systolic (congestive) heart failure: Secondary | ICD-10-CM | POA: Diagnosis not present

## 2014-09-15 DIAGNOSIS — E861 Hypovolemia: Secondary | ICD-10-CM | POA: Diagnosis not present

## 2014-09-15 DIAGNOSIS — Z7982 Long term (current) use of aspirin: Secondary | ICD-10-CM

## 2014-09-15 DIAGNOSIS — Z87891 Personal history of nicotine dependence: Secondary | ICD-10-CM

## 2014-09-15 DIAGNOSIS — I639 Cerebral infarction, unspecified: Secondary | ICD-10-CM | POA: Insufficient documentation

## 2014-09-15 DIAGNOSIS — E119 Type 2 diabetes mellitus without complications: Secondary | ICD-10-CM | POA: Diagnosis present

## 2014-09-15 DIAGNOSIS — I249 Acute ischemic heart disease, unspecified: Secondary | ICD-10-CM

## 2014-09-15 DIAGNOSIS — I2510 Atherosclerotic heart disease of native coronary artery without angina pectoris: Secondary | ICD-10-CM | POA: Diagnosis present

## 2014-09-15 DIAGNOSIS — R4781 Slurred speech: Secondary | ICD-10-CM | POA: Diagnosis not present

## 2014-09-15 DIAGNOSIS — G8194 Hemiplegia, unspecified affecting left nondominant side: Secondary | ICD-10-CM | POA: Diagnosis present

## 2014-09-15 DIAGNOSIS — I219 Acute myocardial infarction, unspecified: Secondary | ICD-10-CM

## 2014-09-15 DIAGNOSIS — I252 Old myocardial infarction: Secondary | ICD-10-CM

## 2014-09-15 LAB — CBC
HEMATOCRIT: 38.9 % — AB (ref 39.0–52.0)
Hemoglobin: 12.5 g/dL — ABNORMAL LOW (ref 13.0–17.0)
MCH: 28.8 pg (ref 26.0–34.0)
MCHC: 32.1 g/dL (ref 30.0–36.0)
MCV: 89.6 fL (ref 78.0–100.0)
Platelets: 141 10*3/uL — ABNORMAL LOW (ref 150–400)
RBC: 4.34 MIL/uL (ref 4.22–5.81)
RDW: 20.4 % — ABNORMAL HIGH (ref 11.5–15.5)
WBC: 18.8 10*3/uL — ABNORMAL HIGH (ref 4.0–10.5)

## 2014-09-15 LAB — DIFFERENTIAL
BASOS PCT: 0 % (ref 0–1)
Basophils Absolute: 0 10*3/uL (ref 0.0–0.1)
EOS ABS: 0 10*3/uL (ref 0.0–0.7)
Eosinophils Relative: 0 % (ref 0–5)
LYMPHS PCT: 5 % — AB (ref 12–46)
Lymphs Abs: 0.9 10*3/uL (ref 0.7–4.0)
MONOS PCT: 3 % (ref 3–12)
Monocytes Absolute: 0.6 10*3/uL (ref 0.1–1.0)
Neutro Abs: 17.3 10*3/uL — ABNORMAL HIGH (ref 1.7–7.7)
Neutrophils Relative %: 92 % — ABNORMAL HIGH (ref 43–77)

## 2014-09-15 LAB — I-STAT TROPONIN, ED: Troponin i, poc: 2.33 ng/mL (ref 0.00–0.08)

## 2014-09-15 LAB — PROTIME-INR
INR: 1.86 — ABNORMAL HIGH (ref 0.00–1.49)
Prothrombin Time: 21.6 seconds — ABNORMAL HIGH (ref 11.6–15.2)

## 2014-09-15 LAB — COMPREHENSIVE METABOLIC PANEL
ALK PHOS: 57 U/L (ref 39–117)
ALT: 17 U/L (ref 0–53)
AST: 27 U/L (ref 0–37)
Albumin: 3.1 g/dL — ABNORMAL LOW (ref 3.5–5.2)
Anion gap: 23 — ABNORMAL HIGH (ref 5–15)
BUN: 35 mg/dL — ABNORMAL HIGH (ref 6–23)
CHLORIDE: 93 meq/L — AB (ref 96–112)
CO2: 24 mEq/L (ref 19–32)
CREATININE: 7.16 mg/dL — AB (ref 0.50–1.35)
Calcium: 10.3 mg/dL (ref 8.4–10.5)
GFR calc non Af Amer: 7 mL/min — ABNORMAL LOW (ref >90)
GFR, EST AFRICAN AMERICAN: 8 mL/min — AB (ref >90)
GLUCOSE: 103 mg/dL — AB (ref 70–99)
POTASSIUM: 4.9 meq/L (ref 3.7–5.3)
Sodium: 140 mEq/L (ref 137–147)
Total Bilirubin: 2.9 mg/dL — ABNORMAL HIGH (ref 0.3–1.2)
Total Protein: 6.9 g/dL (ref 6.0–8.3)

## 2014-09-15 LAB — TROPONIN I: TROPONIN I: 2.16 ng/mL — AB (ref <0.30)

## 2014-09-15 LAB — ETHANOL

## 2014-09-15 LAB — APTT: APTT: 35 s (ref 24–37)

## 2014-09-15 LAB — GLUCOSE, CAPILLARY
Glucose-Capillary: 69 mg/dL — ABNORMAL LOW (ref 70–99)
Glucose-Capillary: 109 mg/dL — ABNORMAL HIGH (ref 70–99)
Glucose-Capillary: 129 mg/dL — ABNORMAL HIGH (ref 70–99)

## 2014-09-15 LAB — TSH: TSH: 1.1 u[IU]/mL (ref 0.350–4.500)

## 2014-09-15 LAB — MAGNESIUM: MAGNESIUM: 1.7 mg/dL (ref 1.5–2.5)

## 2014-09-15 LAB — MRSA PCR SCREENING: MRSA BY PCR: NEGATIVE

## 2014-09-15 LAB — LACTIC ACID, PLASMA: Lactic Acid, Venous: 3.8 mmol/L — ABNORMAL HIGH (ref 0.5–2.2)

## 2014-09-15 LAB — HEPARIN LEVEL (UNFRACTIONATED): Heparin Unfractionated: <0.1 IU/mL — ABNORMAL LOW (ref 0.30–0.70)

## 2014-09-15 MED ORDER — SENNOSIDES-DOCUSATE SODIUM 8.6-50 MG PO TABS: 1.0000 | ORAL_TABLET | Freq: Every evening | ORAL | Status: DC | PRN

## 2014-09-15 MED ORDER — SODIUM CHLORIDE 0.9 % IV SOLN
INTRAVENOUS | Status: DC
  Administered 2014-09-15 – 2014-09-22 (×2): via INTRAVENOUS

## 2014-09-15 MED ORDER — INSULIN ASPART 100 UNIT/ML ~~LOC~~ SOLN
0.0000 [IU] | Freq: Three times a day (TID) | SUBCUTANEOUS | Status: DC
  Administered 2014-09-16: 1 [IU] via SUBCUTANEOUS
  Administered 2014-09-17 (×2): 2 [IU] via SUBCUTANEOUS
  Administered 2014-09-17: 3 [IU] via SUBCUTANEOUS
  Administered 2014-09-18 – 2014-09-19 (×3): 2 [IU] via SUBCUTANEOUS
  Administered 2014-09-19: 1 [IU] via SUBCUTANEOUS
  Administered 2014-09-19: 2 [IU] via SUBCUTANEOUS
  Administered 2014-09-20: 1 [IU] via SUBCUTANEOUS
  Administered 2014-09-20: 2 [IU] via SUBCUTANEOUS

## 2014-09-15 MED ORDER — TICAGRELOR 90 MG PO TABS: 90.0000 mg | ORAL_TABLET | Freq: Two times a day (BID) | ORAL | Status: DC

## 2014-09-15 MED ORDER — PANTOPRAZOLE SODIUM 40 MG PO TBEC
40.0000 mg | DELAYED_RELEASE_TABLET | Freq: Every day | ORAL | Status: DC
  Administered 2014-09-16 – 2014-09-26 (×10): 40 mg via ORAL
  Filled 2014-09-15 (×11): qty 1

## 2014-09-15 MED ORDER — NITROGLYCERIN 0.4 MG SL SUBL: 0.4000 mg | SUBLINGUAL_TABLET | SUBLINGUAL | Status: DC | PRN

## 2014-09-15 MED ORDER — ONDANSETRON 4 MG PO TBDP
4.0000 mg | ORAL_TABLET | Freq: Three times a day (TID) | ORAL | Status: DC | PRN
  Filled 2014-09-15: qty 1

## 2014-09-15 MED ORDER — SODIUM CHLORIDE 0.9 % IV BOLUS (SEPSIS)
250.0000 mL | Freq: Once | INTRAVENOUS | Status: AC
  Administered 2014-09-15: 250 mL via INTRAVENOUS

## 2014-09-15 MED ORDER — ATORVASTATIN CALCIUM 40 MG PO TABS
40.0000 mg | ORAL_TABLET | Freq: Every day | ORAL | Status: DC
  Administered 2014-09-16 – 2014-09-27 (×11): 40 mg via ORAL
  Filled 2014-09-15 (×13): qty 1

## 2014-09-15 MED ORDER — ASPIRIN 81 MG PO CHEW: 324.0000 mg | CHEWABLE_TABLET | ORAL | Status: AC

## 2014-09-15 MED ORDER — METOPROLOL TARTRATE 12.5 MG HALF TABLET
12.5000 mg | ORAL_TABLET | Freq: Two times a day (BID) | ORAL | Status: DC
  Filled 2014-09-15 (×8): qty 1

## 2014-09-15 MED ORDER — HEPARIN (PORCINE) IN NACL 100-0.45 UNIT/ML-% IJ SOLN
1000.0000 [IU]/h | INTRAMUSCULAR | Status: DC
  Administered 2014-09-15: 1000 [IU]/h via INTRAVENOUS
  Administered 2014-09-16: 1250 [IU]/h via INTRAVENOUS
  Administered 2014-09-17: 1000 [IU]/h via INTRAVENOUS
  Filled 2014-09-15 (×5): qty 250

## 2014-09-15 MED ORDER — TICAGRELOR 90 MG PO TABS
90.0000 mg | ORAL_TABLET | Freq: Two times a day (BID) | ORAL | Status: DC
  Administered 2014-09-16 – 2014-09-18 (×6): 90 mg via ORAL
  Filled 2014-09-15 (×8): qty 1

## 2014-09-15 MED ORDER — CALCIUM ACETATE 667 MG PO CAPS
667.0000 mg | ORAL_CAPSULE | Freq: Three times a day (TID) | ORAL | Status: DC
  Administered 2014-09-16 – 2014-09-24 (×20): 667 mg via ORAL
  Filled 2014-09-15 (×31): qty 1

## 2014-09-15 MED ORDER — DEXTROSE 50 % IV SOLN
INTRAVENOUS | Status: AC
  Administered 2014-09-15: 22:00:00
  Filled 2014-09-15: qty 50

## 2014-09-15 MED ORDER — RENA-VITE PO TABS
1.0000 | ORAL_TABLET | Freq: Every day | ORAL | Status: DC
  Administered 2014-09-16 – 2014-09-27 (×10): 1 via ORAL
  Filled 2014-09-15 (×13): qty 1

## 2014-09-15 MED ORDER — PIPERACILLIN-TAZOBACTAM IN DEX 2-0.25 GM/50ML IV SOLN
2.2500 g | Freq: Three times a day (TID) | INTRAVENOUS | Status: DC
  Administered 2014-09-16 – 2014-09-17 (×5): 2.25 g via INTRAVENOUS
  Filled 2014-09-15 (×8): qty 50

## 2014-09-15 MED ORDER — STROKE: EARLY STAGES OF RECOVERY BOOK
Freq: Once | Status: AC
  Administered 2014-09-16
  Filled 2014-09-15: qty 1

## 2014-09-15 MED ORDER — ACETAMINOPHEN 325 MG PO TABS
650.0000 mg | ORAL_TABLET | ORAL | Status: DC | PRN
  Administered 2014-09-18 (×2): 650 mg via ORAL
  Filled 2014-09-15 (×2): qty 2

## 2014-09-15 MED ORDER — SODIUM CHLORIDE 0.9 % IV SOLN
1500.0000 mg | Freq: Once | INTRAVENOUS | Status: AC
  Administered 2014-09-15: 1500 mg via INTRAVENOUS
  Filled 2014-09-15: qty 1500

## 2014-09-15 MED ORDER — ONDANSETRON HCL 4 MG/2ML IJ SOLN: 4.0000 mg | Freq: Four times a day (QID) | INTRAMUSCULAR | Status: DC | PRN

## 2014-09-15 MED ORDER — DOPAMINE-DEXTROSE 3.2-5 MG/ML-% IV SOLN
5.0000 ug/kg/min | INTRAVENOUS | Status: DC
  Administered 2014-09-16: 5 ug/kg/min via INTRAVENOUS
  Administered 2014-09-19: 2.5 ug/kg/min via INTRAVENOUS
  Administered 2014-09-21: 5 ug/kg/min via INTRAVENOUS
  Filled 2014-09-15 (×3): qty 250

## 2014-09-15 MED ORDER — ASPIRIN 81 MG PO CHEW: 324.0000 mg | CHEWABLE_TABLET | Freq: Once | ORAL | Status: DC

## 2014-09-15 MED ORDER — ASPIRIN EC 81 MG PO TBEC
81.0000 mg | DELAYED_RELEASE_TABLET | Freq: Every day | ORAL | Status: DC
  Administered 2014-09-16 – 2014-09-18 (×3): 81 mg via ORAL
  Filled 2014-09-15 (×4): qty 1

## 2014-09-15 MED ORDER — HEPARIN SODIUM (PORCINE) 5000 UNIT/ML IJ SOLN: 5000.0000 [IU] | Freq: Three times a day (TID) | INTRAMUSCULAR | Status: DC

## 2014-09-15 MED ORDER — ASPIRIN 300 MG RE SUPP
300.0000 mg | RECTAL | Status: AC
  Administered 2014-09-15: 300 mg via RECTAL
  Filled 2014-09-15: qty 1

## 2014-09-15 MED ORDER — ASPIRIN EC 81 MG PO TBEC: 81.0000 mg | DELAYED_RELEASE_TABLET | Freq: Every day | ORAL | Status: DC

## 2014-09-15 MED ORDER — ASPIRIN 300 MG RE SUPP
300.0000 mg | Freq: Once | RECTAL | Status: AC
  Administered 2014-09-15: 300 mg via RECTAL

## 2014-09-15 MED ORDER — VANCOMYCIN HCL IN DEXTROSE 750-5 MG/150ML-% IV SOLN: 750.0000 mg | INTRAVENOUS | Status: DC

## 2014-09-15 NOTE — H&P (Signed)
Brad Mcintosh is an 79 y.o. male.   Chief Complaint: Confusion/left-sided weakness/elevated troponin I HPI: Patient is 79 year old male with past medical history significant for multiple medical problems i.e. coronary artery disease history of silent inferior wall myocardial infarction in the past and non-Q-wave myocardial infarction in January 2015 subsequently had PTCA stenting to LAD and left circumflex noted to have chronically occluded RCA, hypertension, diabetes mellitus, end-stage renal disease on hemodialysis, history of paroxysmal A. fib, valvular heart disease, hypercholesterolemia, peripheral vascular disease, valvular heart disease, anemia of chronic disease, history of tobacco abuse, gouty arthritis, history of CVA of prostate, came to the ER by EMS as patient was noted to be confused and had left-sided weakness and left facial droop earlier this morning. Patient's son states he woke up with questionable slurred speech around 3 AM did not seek any medical attention while going to the dialysis and developed confusion and called EMS and came to ED. EKG done in the ED showed atrial fibrillation with controlled ventricular response and ST-T wave changes in anterolateral leads which were new as compared to prior EKG and was noted to have troponin of 2.3. Patient presently denies any chest pain.  Past Medical History  Diagnosis Date  . Hypertension   . Hyperlipidemia   . Cancer     prostate  . Gout   . Anemia     Iron deficiency  . Thyroid disease   . Malnutrition   . Atrial fibrillation     paroxysmal  . Chronic kidney disease     Pt has HD on TUESDAY, THURSDAY and SATURDAY  at [facility]  . Peripheral vascular disease   . Coronary artery disease   . Shortness of breath   . GERD (gastroesophageal reflux disease)   . Aortic stenosis     moderate AS by 10/2013 echo  . Myocardial infarction 10/2013  . Pneumonia 2015  . History of blood transfusion   . Internal hemorrhoids   .  Diverticulosis   . Hyperplastic colon polyp   . Anemia     Past Surgical History  Procedure Laterality Date  . Av fistula placement  06/2006    left forearm  Pt denies  . Amputation      right finger  . Incise and drain abcess      chronic complex infection right long finger  . Insertion of dialysis catheter  05/30/2010    right internal jugular Palindrome catheter  . Aortogram  06/01/11    w/ runoff  . Tee without cardioversion N/A 10/25/2013    Procedure: TRANSESOPHAGEAL ECHOCARDIOGRAM (TEE);  Surgeon: [name], MD;  Location: [place];  Service: Cardiovascular;  Laterality: N/A;  . Insertion of dialysis catheter N/A 10/25/2013    Procedure: INSERTION OF DIALYSIS CATHETER;  Surgeon: Elam Dutch, MD;  Location: [place];  Service: Vascular;  Laterality: N/A;  Attempted both right and left neck, procedure unsucessful.  . Cardiac catheterization    . Toe amputation      bil feet  . Coronary angioplasty  11/09/13    LAD Stented  . Av fistula placement Right 01/31/2014    Procedure: INSERTION OF ARTERIOVENOUS (AV) GORE-TEX GRAFT ARM- RIGHT FOREARM;  Surgeon: Conrad [place]s Grove, MD;  Location: [facility] OR;  Service: Vascular;  Laterality: Right;    Family History  Problem Relation Age of Onset  . Hypertension Mother   . Asthma Father   . Cancer Sister     breast  . Kidney disease  Brother   . Diabetes Brother    Social History:  reports that he quit smoking about 42 years ago. His smoking use included Cigarettes. He smoked 0.50 packs per day. He has never used smokeless tobacco. He reports that he does not drink alcohol or use illicit drugs.  Allergies:  Allergies  Allergen Reactions  . Altace [Ramipril] Other (See Comments) and Cough    Also renal failure     (Not in a hospital admission)  Results for orders placed or performed during the hospital encounter of 09/14/2014 (from the past 48 hour(s))  Ethanol     Status: None   Collection Time: 09/23/2014  7:56 AM  Result Value  Ref Range   Alcohol, Ethyl (B) <11 0 - 11 mg/dL    Comment:        LOWEST DETECTABLE LIMIT FOR SERUM ALCOHOL IS 11 mg/dL FOR MEDICAL PURPOSES ONLY   Protime-INR     Status: Abnormal   Collection Time: 09/30/2014  7:56 AM  Result Value Ref Range   Prothrombin Time 21.6 (H) 11.6 - 15.2 seconds   INR 1.86 (H) 0.00 - 1.49  APTT     Status: None   Collection Time: 10/10/2014  7:56 AM  Result Value Ref Range   aPTT 35 24 - 37 seconds  CBC     Status: Abnormal   Collection Time: 10/06/2014  7:56 AM  Result Value Ref Range   WBC 18.8 (H) 4.0 - 10.5 K/uL   RBC 4.34 4.22 - 5.81 MIL/uL   Hemoglobin 12.5 (L) 13.0 - 17.0 g/dL   HCT 38.9 (L) 39.0 - 52.0 %   MCV 89.6 78.0 - 100.0 fL   MCH 28.8 26.0 - 34.0 pg   MCHC 32.1 30.0 - 36.0 g/dL   RDW 20.4 (H) 11.5 - 15.5 %   Platelets 141 (L) 150 - 400 K/uL    Comment: PLATELET COUNT CONFIRMED BY SMEAR  Differential     Status: Abnormal   Collection Time: 10/11/2014  7:56 AM  Result Value Ref Range   Neutrophils Relative % 92 (H) 43 - 77 %   Lymphocytes Relative 5 (L) 12 - 46 %   Monocytes Relative 3 3 - 12 %   Eosinophils Relative 0 0 - 5 %   Basophils Relative 0 0 - 1 %   Neutro Abs 17.3 (H) 1.7 - 7.7 K/uL   Lymphs Abs 0.9 0.7 - 4.0 K/uL   Monocytes Absolute 0.6 0.1 - 1.0 K/uL   Eosinophils Absolute 0.0 0.0 - 0.7 K/uL   Basophils Absolute 0.0 0.0 - 0.1 K/uL   RBC Morphology BURR CELLS     Comment: ELLIPTOCYTES POLYCHROMASIA PRESENT   Comprehensive metabolic panel     Status: Abnormal   Collection Time: 09/12/2014  7:56 AM  Result Value Ref Range   Sodium 140 137 - 147 mEq/L   Potassium 4.9 3.7 - 5.3 mEq/L   Chloride 93 (L) 96 - 112 mEq/L   CO2 24 19 - 32 mEq/L   Glucose, Bld 103 (H) 70 - 99 mg/dL   BUN 35 (H) 6 - 23 mg/dL   Creatinine, Ser 7.16 (H) 0.50 - 1.35 mg/dL   Calcium 10.3 8.4 - 10.5 mg/dL   Total Protein 6.9 6.0 - 8.3 g/dL   Albumin 3.1 (L) 3.5 - 5.2 g/dL   AST 27 0 - 37 U/L   ALT 17 0 - 53 U/L   Alkaline Phosphatase 57 39 -  117 U/L  Total Bilirubin 2.9 (H) 0.3 - 1.2 mg/dL   GFR calc non Af Amer 7 (L) >90 mL/min   GFR calc Af Amer 8 (L) >90 mL/min    Comment: (NOTE) The eGFR has been calculated using the CKD EPI equation. This calculation has not been validated in all clinical situations. eGFR's persistently <90 mL/min signify possible Chronic Kidney Disease.    Anion gap 23 (H) 5 - 15  I-stat troponin, ED (not at [facility])     Status: Abnormal   Collection Time: 10/05/2014  8:10 AM  Result Value Ref Range   Troponin i, poc 2.33 (HH) 0.00 - 0.08 ng/mL   Comment NOTIFIED PHYSICIAN    Comment 3            Comment: Due to the release kinetics of cTnI, a negative result within the first hours of the onset of symptoms does not rule out myocardial infarction with certainty. If myocardial infarction is still suspected, repeat the test at appropriate intervals.    Ct Head Wo Contrast  09/21/2014   CLINICAL DATA:  Lethargy and altered mental status. Dialysis patient. Left facial droop. Initial encounter.  EXAM: CT HEAD WITHOUT CONTRAST  TECHNIQUE: Contiguous axial images were obtained from the base of the skull through the vertex without intravenous contrast.  COMPARISON:  Head CT 10/28/2012.  FINDINGS: There is no evidence of acute intracranial hemorrhage, mass lesion, brain edema or extra-axial fluid collection. The ventricles and subarachnoid spaces are prominent but stable. There is no CT evidence of acute cortical infarction. There is a stable old lacunar infarct in the right thalamus and stable chronic periventricular white matter disease. There is an old infarct in the high right parietal lobe which appears unchanged. Diffuse intracranial vascular calcifications are noted. The superior orbital veins appear prominent, especially on the right. No other or orbital abnormalities are identified. The cavernous sinus regions appear stable.  The visualized paranasal sinuses, mastoid air cells and middle ears are clear. The  calvarium is intact.  IMPRESSION: 1. No acute intracranial findings. 2. Stable atrophy and chronic small vessel ischemic changes. 3. Prominence of the superior orbital veins of undetermined significance/etiology.   Electronically Signed   By: Camie Patience M.D.   On: 10/11/2014 08:04    Review of Systems  Constitutional: Negative for fever and chills.  Eyes: Negative for blurred vision and double vision.  Respiratory: Negative for hemoptysis and sputum production.   Cardiovascular: Negative for chest pain and orthopnea.  Gastrointestinal: Negative for nausea and vomiting.  Neurological: Positive for dizziness and weakness. Negative for headaches.    Blood pressure 107/61, pulse 78, temperature 98.2 F (36.8 C), temperature source Oral, resp. rate 33, SpO2 99 %. Physical Exam  HENT:  Head: Normocephalic and atraumatic.  Eyes: Conjunctivae are normal. Pupils are equal, round, and reactive to light.  Neck: Normal range of motion. Neck supple. No JVD present. No tracheal deviation present. No thyromegaly present.  Cardiovascular:  Irregularly irregular S1 and S2 soft there is 2/6 systolic murmur noted  Respiratory:  Decreased breath sounds at bases  GI: Soft. Bowel sounds are normal. He exhibits no distension. There is no tenderness. There is no rebound.  Musculoskeletal: He exhibits no edema or tenderness.  Neurological: He is alert.  Motor strength 5 over 5 in right upper and lower extremities and 4 over 5 in left upper and lower extremities No significant facial asymmetry noted     Assessment/Plan Small non-Q-wave myocardial infarction with atypical presentation Confusion/mild left-sided weakness  rule out cardioembolic CVA Atrial fibrillation with controlled ventricular response CAD history of MI 2 in the past status post PCI to LAD and left circumflex in January 2015 Hypertension Diabetes mellitus Moderate aortic stenosis Peripheral vascular disease End-stage renal disease on  hemodialysis Hypercholesteremia Anemia of chronic disease Tobacco abuse Marked leukocytosis etiology unclear History of gouty arthritis History of CA of prostate Plan As per orders Neurology consult Renal service consult   Ireanna Finlayson N 09/19/2014, 11:10 AM

## 2014-09-15 NOTE — ED Notes (Signed)
Pt arrives [facility] from dialysis. Per EMS pt was lethargic and having difficulty following command. Per EMS dialysis stated they notice the pt was having some left sided weakness with a left side facial droop. According to dialysis pt was normal on Thursday. Pt denies pain. Pt has old fistula in left arm and new one in right arm. Pt has a hx of A-fib. Per EMS pt was in Afib on monitor.

## 2014-09-15 NOTE — Care Management (Addendum)
CARE MANAGEMENT NOTE 10/10/2014  Patient:  Brad Mcintosh, Brad Mcintosh   Account Number:  [id number]  Date Initiated:  09/14/2014  Documentation initiated by:  Laurena Slimmer  Subjective/Objective Assessment:   79 y.o. male who is here with the report of weakness left side, associated with confusion, that was noticed prior to his dialysis treatment this morning     Action/Plan:   Continue  Hemodialysis    Anticipated DC Date:     Anticipated DC Plan:           Choice offered to / List presented to:             Status of service:  In process, will continue to follow Medicare Important Message given?   (If response is "NO", the following Medicare IM given date fields will be blank) Date Medicare IM given:   Medicare IM given by:   Date Additional Medicare IM given:   Additional Medicare IM given by:    Discharge Disposition:    Per UR Regulation:    If discussed at Long Length of Stay Meetings, dates discussed:    Comments:  09/21/2014 13:39 [name] RN BSN NCM ED CM meet with patient at bedside. Patient presents to [facility] ED for c/o  weakness left side, associated with confusion, patient was sent from dialysis. ED evaluation r/o CVANeuro consulted .  Patient receives HD at [facility] T/Th/Sat   [phone]. Patient lives at home with daughter Brad Mcintosh [phone] as per record. Unit CM and CSW will continue to follow for disposition plan.

## 2014-09-15 NOTE — ED Notes (Signed)
[phone] Bernette Mayers (son).  Spoke to son to have current medications brought to the ED and have a family member at bedside to answer questions per our EDP.

## 2014-09-15 NOTE — ED Notes (Signed)
Abnormal labs given to Dr.Wentz

## 2014-09-15 NOTE — Progress Notes (Signed)
Pt transferring to 2 heart. Pt's son made aware. Report called.

## 2014-09-15 NOTE — ED Notes (Signed)
Patient states he does not make urine

## 2014-09-15 NOTE — Consult Note (Signed)
[facility] Renal Consultation Note  Indication for Consultation:  Management of ESRD/hemodialysis; anemia, hypertension/volume and secondary hyperparathyroidism  HPI: Brad Mcintosh is a 79 y.o. male presented to op Kidney center ([facility]) with slurred speech, confusion admitted with STEMI and probable Cardio embolic CVA (  Ct of hd=no acute Intracranial hemorrhage / old prior cvas and sm ves dz) . He presents with lowish bp in er 90/59  Now up to 100/60  With some IV fluid bolus starting and Neurology to consult. No reported acute problems at op Kidney center previously/  using  His R FA AVGG . Now in ER , in A. Fib  VR 90s's (ho  Of A. Fib  Parox. Not on coumadin) confused, oriented to person only/ more lethargic than usual unable to give me any history ,and slightly slurred speech.    Past Medical History  Diagnosis Date  . Hypertension   . Hyperlipidemia   . Cancer     prostate  . Gout   . Anemia     Iron deficiency  . Thyroid disease   . Malnutrition   . Atrial fibrillation     paroxysmal  . Chronic kidney disease     Pt has HD on TUESDAY, THURSDAY and SATURDAY  at [facility]  . Peripheral vascular disease   . Coronary artery disease   . Shortness of breath   . GERD (gastroesophageal reflux disease)   . Aortic stenosis     moderate AS by 10/2013 echo  . Myocardial infarction 10/2013  . Pneumonia 2015  . History of blood transfusion   . Internal hemorrhoids   . Diverticulosis   . Hyperplastic colon polyp   . Anemia     Past Surgical History  Procedure Laterality Date  . Av fistula placement  06/2006    left forearm  Pt denies  . Amputation      right finger  . Incise and drain abcess      chronic complex infection right long finger  . Insertion of dialysis catheter  05/30/2010    right internal jugular Palindrome catheter  . Aortogram  06/01/11    w/ runoff  . Tee without cardioversion N/A 10/25/2013    Procedure: TRANSESOPHAGEAL ECHOCARDIOGRAM (TEE);   Surgeon: [name], MD;  Location: [place];  Service: Cardiovascular;  Laterality: N/A;  . Insertion of dialysis catheter N/A 10/25/2013    Procedure: INSERTION OF DIALYSIS CATHETER;  Surgeon: Elam Dutch, MD;  Location: [place];  Service: Vascular;  Laterality: N/A;  Attempted both right and left neck, procedure unsucessful.  . Cardiac catheterization    . Toe amputation      bil feet  . Coronary angioplasty  11/09/13    LAD Stented  . Av fistula placement Right 01/31/2014    Procedure: INSERTION OF ARTERIOVENOUS (AV) GORE-TEX GRAFT ARM- RIGHT FOREARM;  Surgeon: Conrad [place], MD;  Location: [facility];  Service: Vascular;  Laterality: Right;      Family History  Problem Relation Age of Onset  . Hypertension Mother   . Asthma Father   . Cancer Sister     breast  . Kidney disease Brother   . Diabetes Brother       reports that he quit smoking about 42 years ago. His smoking use included Cigarettes. He smoked 0.50 packs per day. He has never used smokeless tobacco. He reports that he does not drink alcohol or use illicit drugs.   Allergies  Allergen Reactions  . Altace [Ramipril] Other (See Comments) and Cough    Also renal failure    Prior to Admission medications   Medication Sig Start Date End Date Taking? Authorizing Provider  aspirin EC 81 MG EC tablet Take 1 tablet (81 mg total) by mouth daily. 10/26/13  Yes Shanker Kristeen Mans, MD  calcium acetate (PHOSLO) 667 MG capsule Take 667 mg by mouth 3 (three) times daily with meals.  06/20/12  Yes Historical Provider, MD  gabapentin (NEURONTIN) 100 MG capsule Take 100 mg by mouth 3 (three) times daily.  01/22/14  Yes Historical Provider, MD  omeprazole (PRILOSEC) 40 MG capsule Take 40 mg by mouth daily. 08/18/14  Yes Historical Provider, MD  citalopram (CELEXA) 20 MG tablet Take 20 mg by mouth at bedtime. Depression    Historical Provider, MD  cloNIDine (CATAPRES) 0.3 MG tablet Take 1 tablet (0.3 mg total) by mouth at bedtime.  11/10/13   [name], MD  colchicine 0.6 MG tablet Take 0.6 mg by mouth daily as needed (gout).     Historical Provider, MD  cyclobenzaprine (FLEXERIL) 10 MG tablet Take 10 mg by mouth 3 (three) times daily as needed for muscle spasms.    Historical Provider, MD  HYDROcodone-acetaminophen (NORCO/VICODIN) 5-325 MG per tablet Take 1 tablet by mouth every 6 (six) hours as needed for moderate pain.     Historical Provider, MD  hydrOXYzine (ATARAX/VISTARIL) 25 MG tablet Take 1 tablet (25 mg total) by mouth every 6 (six) hours as needed for itching. 02/18/14   Clayton Bibles, PA-C  isosorbide mononitrate (IMDUR) 60 MG 24 hr tablet Take 60 mg by mouth daily.  11/22/13   Historical Provider, MD  metoprolol succinate (TOPROL-XL) 100 MG 24 hr tablet Take 100 mg by mouth daily.  01/05/14   Historical Provider, MD  multivitamin (RENA-VIT) TABS tablet Take 1 tablet by mouth daily.    Historical Provider, MD  ondansetron (ZOFRAN-ODT) 4 MG disintegrating tablet Take 4 mg by mouth every 8 (eight) hours as needed for nausea or vomiting.    Historical Provider, MD  pantoprazole (PROTONIX) 40 MG tablet Take 40 mg by mouth daily.    Historical Provider, MD  predniSONE (STERAPRED UNI-PAK) 10 MG tablet Take by mouth daily. Day 1: take 6 tabs.  Day 2: 5 tabs  Day 3: 4 tabs  Day 4: 3 tabs  Day 5: 2 tabs  Day 6: 1 tab 02/18/14   Clayton Bibles, PA-C  simvastatin (ZOCOR) 10 MG tablet Take 10 mg by mouth at bedtime.    Historical Provider, MD  Ticagrelor (BRILINTA) 90 MG TABS tablet Take 1 tablet (90 mg total) by mouth 2 (two) times daily. 11/10/13   [name], MD     Anti-infectives    None      Results for orders placed or performed during the hospital encounter of 09/16/2014 (from the past 48 hour(s))  Ethanol     Status: None   Collection Time: 10/09/2014  7:56 AM  Result Value Ref Range   Alcohol, Ethyl (B) <11 0 - 11 mg/dL    Comment:        LOWEST DETECTABLE LIMIT FOR SERUM ALCOHOL IS 11 mg/dL FOR MEDICAL PURPOSES  ONLY   Protime-INR     Status: Abnormal   Collection Time: 10/03/2014  7:56 AM  Result Value Ref Range   Prothrombin Time 21.6 (H) 11.6 - 15.2 seconds   INR 1.86 (H) 0.00 - 1.49  APTT  Status: None   Collection Time: 10/02/2014  7:56 AM  Result Value Ref Range   aPTT 35 24 - 37 seconds  CBC     Status: Abnormal   Collection Time: 09/27/2014  7:56 AM  Result Value Ref Range   WBC 18.8 (H) 4.0 - 10.5 K/uL   RBC 4.34 4.22 - 5.81 MIL/uL   Hemoglobin 12.5 (L) 13.0 - 17.0 g/dL   HCT 38.9 (L) 39.0 - 52.0 %   MCV 89.6 78.0 - 100.0 fL   MCH 28.8 26.0 - 34.0 pg   MCHC 32.1 30.0 - 36.0 g/dL   RDW 20.4 (H) 11.5 - 15.5 %   Platelets 141 (L) 150 - 400 K/uL    Comment: PLATELET COUNT CONFIRMED BY SMEAR  Differential     Status: Abnormal   Collection Time: 09/12/2014  7:56 AM  Result Value Ref Range   Neutrophils Relative % 92 (H) 43 - 77 %   Lymphocytes Relative 5 (L) 12 - 46 %   Monocytes Relative 3 3 - 12 %   Eosinophils Relative 0 0 - 5 %   Basophils Relative 0 0 - 1 %   Neutro Abs 17.3 (H) 1.7 - 7.7 K/uL   Lymphs Abs 0.9 0.7 - 4.0 K/uL   Monocytes Absolute 0.6 0.1 - 1.0 K/uL   Eosinophils Absolute 0.0 0.0 - 0.7 K/uL   Basophils Absolute 0.0 0.0 - 0.1 K/uL   RBC Morphology BURR CELLS     Comment: ELLIPTOCYTES POLYCHROMASIA PRESENT   Comprehensive metabolic panel     Status: Abnormal   Collection Time: 09/21/2014  7:56 AM  Result Value Ref Range   Sodium 140 137 - 147 mEq/L   Potassium 4.9 3.7 - 5.3 mEq/L   Chloride 93 (L) 96 - 112 mEq/L   CO2 24 19 - 32 mEq/L   Glucose, Bld 103 (H) 70 - 99 mg/dL   BUN 35 (H) 6 - 23 mg/dL   Creatinine, Ser 7.16 (H) 0.50 - 1.35 mg/dL   Calcium 10.3 8.4 - 10.5 mg/dL   Total Protein 6.9 6.0 - 8.3 g/dL   Albumin 3.1 (L) 3.5 - 5.2 g/dL   AST 27 0 - 37 U/L   ALT 17 0 - 53 U/L   Alkaline Phosphatase 57 39 - 117 U/L   Total Bilirubin 2.9 (H) 0.3 - 1.2 mg/dL   GFR calc non Af Amer 7 (L) >90 mL/min   GFR calc Af Amer 8 (L) >90 mL/min    Comment:  (NOTE) The eGFR has been calculated using the CKD EPI equation. This calculation has not been validated in all clinical situations. eGFR's persistently <90 mL/min signify possible Chronic Kidney Disease.    Anion gap 23 (H) 5 - 15  I-stat troponin, ED (not at [facility])     Status: Abnormal   Collection Time: 09/23/2014  8:10 AM  Result Value Ref Range   Troponin i, poc 2.33 (HH) 0.00 - 0.08 ng/mL   Comment NOTIFIED PHYSICIAN    Comment 3            Comment: Due to the release kinetics of cTnI, a negative result within the first hours of the onset of symptoms does not rule out myocardial infarction with certainty. If myocardial infarction is still suspected, repeat the test at appropriate intervals.      ROS: not able to getr history from pt. See hpi Physical Exam: Filed Vitals:   09/13/2014 1104  BP: 107/61  Pulse: 78  Temp:   Resp: 33     General:Lethargic /aroused to voice, chronically ill Adult BM, NAD HEENT:[place]  , MM dry Neck:neck supple Heart: Irreg , irreg VR 90 on moniter, 2/6 HSEM  lsb and  Aortic reg Lungs: grossly CTA  Abdomen: BS pos. Soft NT, ND Extremities:  No pedal edema Skin:  No overt rash , dry  Skin lower extrem , mild excoruiations on lower legs Neuro: Somewhat slurred speech, 0x 1= Person / moves all extrem / l lower sl weaker L arm and leg 3/5 strength Dialysis Access: Pos bruit  R FA AVG  Dialysis Orders: Center: [facility]  on TTS . EDW 85 kg HD Bath 2k, 2.25ca  Time  4hrs Heparin 10,000. Access lua avf BFR 400 DFR 800    Hectorol 6 mcg IV/HD Aranesp 58mcg q thurs   Units IV/HD  Venofer  $Remove'100mg'LXAWUln$  q weekly hd  Other  Op labs 12-03= 11.3 hgb, pth 924 08-04-14  Assessment/Plan 1. CVA- Neurology seeing 2. Nstemi- per Card 3. ESRD -  HD  TTS   4. Hypertension/volume  -  bp 100/60 , multi home bp meds , hold on admit.  Keep even  On hd , no cxr available now 5. Anemia  -  HGb12.5 hold esa continue weekly fe q thurs hd 6. Metabolic bone disease -   hectorol 3mcg q  hd and binder when eating 7. A. Fib  /HO CAD prior MI X2, hx stents -per Dr. Terrence Dupont  8. DM - per admit 9. Ho Moderate Aort . Stenosis 10. HO prostatic CA  Ernest Haber, PA-C [place] [phone] 09/19/2014, 11:42 AM   Pt seen, examined, agree w assess/plan as above with additions as indicated. ESRD patient with hx of CAD, afib, HTN presenting with slurred speech and L-sided weakness, suspected CVA. Also +trop/ NSTEMI.  Admitted, for HD today. Is at dry wt, BP soft, no fluid off with HD today.  Will follow.  Kelly Splinter MD pager [phone]    cell [phone] 10/03/2014, 3:14 PM

## 2014-09-15 NOTE — Consult Note (Signed)
Stroke Consult    Chief Complaint: altered mental status  HPI: Brad Mcintosh is an 79 y.o. male who is here with the report of weakness left side, associated with confusion, that was noticed prior to his dialysis treatment this morning. He was transferred here by EMS. The patient denies any problems and states he is not sure why he is here. He went to dialysis, from his home this morning by private vehicle, driven by his son. He states that he was able to walk into dialysis. He did not eat breakfast this morning. He denies headache or weakness. He complains of pain in his left upper arm. There's been no known trauma.  Unable to reach family so no further information available. Per review of records he has history of HTN, paroxysmal A fib (on ASA and brilinta), ESRD on HD.   Date last known well: 09/14/2014 Time last known well: 2100 tPA Given: no, outside window  Past Medical History  Diagnosis Date  . Hypertension   . Hyperlipidemia   . Cancer     prostate  . Gout   . Anemia     Iron deficiency  . Thyroid disease   . Malnutrition   . Atrial fibrillation     paroxysmal  . Chronic kidney disease     Pt has HD on TUESDAY, THURSDAY and SATURDAY  at [facility]  . Peripheral vascular disease   . Coronary artery disease   . Shortness of breath   . GERD (gastroesophageal reflux disease)   . Aortic stenosis     moderate AS by 10/2013 echo  . Myocardial infarction 10/2013  . Pneumonia 2015  . History of blood transfusion   . Internal hemorrhoids   . Diverticulosis   . Hyperplastic colon polyp   . Anemia     Past Surgical History  Procedure Laterality Date  . Av fistula placement  06/2006    left forearm  Pt denies  . Amputation      right finger  . Incise and drain abcess      chronic complex infection right long finger  . Insertion of dialysis catheter  05/30/2010    right internal jugular Palindrome catheter  . Aortogram  06/01/11    w/ runoff  . Tee without  cardioversion N/A 10/25/2013    Procedure: TRANSESOPHAGEAL ECHOCARDIOGRAM (TEE);  Surgeon: Ricki Rodriguez, MD;  Location: [facility] ENDOSCOPY;  Service: Cardiovascular;  Laterality: N/A;  . Insertion of dialysis catheter N/A 10/25/2013    Procedure: INSERTION OF DIALYSIS CATHETER;  Surgeon: Sherren Kerns, MD;  Location: [facility] OR;  Service: Vascular;  Laterality: N/A;  Attempted both right and left neck, procedure unsucessful.  . Cardiac catheterization    . Toe amputation      bil feet  . Coronary angioplasty  11/09/13    LAD Stented  . Av fistula placement Right 01/31/2014    Procedure: INSERTION OF ARTERIOVENOUS (AV) GORE-TEX GRAFT ARM- RIGHT FOREARM;  Surgeon: Fransisco Hertz, MD;  Location: [facility] OR;  Service: Vascular;  Laterality: Right;    Family History  Problem Relation Age of Onset  . Hypertension Mother   . Asthma Father   . Cancer Sister     breast  . Kidney disease Brother   . Diabetes Brother    Social History:  reports that he quit smoking about 42 years ago. His smoking use included Cigarettes. He smoked 0.50 packs per day. He has never used smokeless tobacco. He reports that  he does not drink alcohol or use illicit drugs.  Allergies:  Allergies  Allergen Reactions  . Altace [Ramipril] Other (See Comments) and Cough    Also renal failure     (Not in a hospital admission)  ROS: Out of a complete 14 system review, the patient complains of only the following symptoms, and all other reviewed systems are negative. +pain, denies any other ROS  CT head imaging reviewed and appears unremarkable.   Physical Examination: Filed Vitals:   09/22/2014 1200  BP: 100/60  Pulse: 95  Temp:   Resp: 27   Physical Exam  Constitutional: mild distress Psych: Affect appropriate to situation Eyes: No scleral injection HENT: No OP obstrucion Head: Normocephalic.  Cardiovascular: irregularly irregular Respiratory: Effort normal and breath sounds normal.  GI: Soft. Bowel sounds are normal.  No distension. There is no tenderness.  Skin: WDI   Neurologic Examination: Mental Status: Alert, oriented to name, "Zacarias Pontes", unsure on date or why he is here. Follows simple one step commands, difficulty with multi-step commands. Moderate dysarthria. Minimal verbal output.  Cranial Nerves: II: funduscopic exam wnl bilaterally, visual fields grossly normal, pupils equal, round, reactive to light and accommodation III,IV, VI: ptosis not present, extra-ocular motions intact bilaterally V,VII: smile symmetric (apparent facial asymmetry likely related to uneven mustache), facial light touch sensation normal bilaterally VIII: hearing normal bilaterally IX,X: gag reflex present XI: trapezius strength/neck flexion strength normal bilaterally XII: tongue strength normal  Motor: RUE and RLE 5/5 strength throughout. LUE 4-/5 proximal to 5-/5 distal though question if pain related (notes pain in shoulder and hand region). 5-/5 proximal LLE to 5/5 distal Tone and bulk:normal tone throughout; no atrophy noted Sensory: Pinprick and light touch intact throughout, bilaterally Deep Tendon Reflexes: 1+ and symmetric throughout, absent AJs bilaterally Plantars: Right: downgoing   Left: downgoing Cerebellar: Unable to obtain due to mental status Gait: unable to obtain due to mental status Laboratory Studies:   Basic Metabolic Panel:  Recent Labs Lab 09/16/2014 0756  NA 140  K 4.9  CL 93*  CO2 24  GLUCOSE 103*  BUN 35*  CREATININE 7.16*  CALCIUM 10.3    Liver Function Tests:  Recent Labs Lab 09/11/2014 0756  AST 27  ALT 17  ALKPHOS 57  BILITOT 2.9*  PROT 6.9  ALBUMIN 3.1*   No results for input(s): LIPASE, AMYLASE in the last 168 hours. No results for input(s): AMMONIA in the last 168 hours.  CBC:  Recent Labs Lab 10/03/2014 0756  WBC 18.8*  NEUTROABS 17.3*  HGB 12.5*  HCT 38.9*  MCV 89.6  PLT 141*    Cardiac Enzymes: No results for input(s): CKTOTAL, CKMB, CKMBINDEX,  TROPONINI in the last 168 hours.  BNP: Invalid input(s): POCBNP  CBG: No results for input(s): GLUCAP in the last 168 hours.  Microbiology: Results for orders placed or performed during the hospital encounter of 10/21/13  Blood Culture (routine x 2)     Status: None   Collection Time: 10/21/13  4:50 PM  Result Value Ref Range Status   Specimen Description BLOOD RIGHT FOREARM  Final   Special Requests BOTTLES DRAWN AEROBIC AND ANAEROBIC 5CC  Final   Culture  Setup Time   Final    10/21/2013 22:05 Performed at [place]   Final    NO GROWTH 5 DAYS Performed at [facility]   Report Status 10/27/2013 FINAL  Final  Blood Culture (routine x 2)     Status: None  Collection Time: 10/21/13  5:05 PM  Result Value Ref Range Status   Specimen Description BLOOD RIGHT WRIST  Final   Special Requests BOTTLES DRAWN AEROBIC AND ANAEROBIC 5CC  Final   Culture  Setup Time   Final    10/21/2013 22:04 Performed at [facility]   Culture   Final    NO GROWTH 5 DAYS Performed at [facility]   Report Status 10/27/2013 FINAL  Final  MRSA PCR Screening     Status: None   Collection Time: 10/21/13 10:14 PM  Result Value Ref Range Status   MRSA by PCR NEGATIVE NEGATIVE Final    Comment:        The GeneXpert MRSA Assay (FDA approved for NASAL specimens only), is one component of a comprehensive MRSA colonization surveillance program. It is not intended to diagnose MRSA infection nor to guide or monitor treatment for MRSA infections.  Respiratory virus panel (routine influenza)     Status: None   Collection Time: 10/21/13 11:04 PM  Result Value Ref Range Status   Source - RVPAN NASAL SWAB  Corrected    Comment: CORRECTED ON 01/12 AT 0138: PREVIOUSLY REPORTED AS NASAL SWAB   Respiratory Syncytial Virus A NOT DETECTED  Final   Respiratory Syncytial Virus B NOT DETECTED  Final   Influenza A NOT DETECTED  Final   Influenza B NOT DETECTED  Final    Parainfluenza 1 NOT DETECTED  Final   Parainfluenza 2 NOT DETECTED  Final   Parainfluenza 3 NOT DETECTED  Final   Metapneumovirus NOT DETECTED  Final   Rhinovirus NOT DETECTED  Final   Adenovirus NOT DETECTED  Final   Influenza A H1 NOT DETECTED  Final   Influenza A H3 NOT DETECTED  Final    Comment: (NOTE)       Normal Reference Range for each Analyte: NOT DETECTED Testing performed using the Luminex xTAG Respiratory Viral Panel test kit. This test was developed and its performance characteristics determined by [facility]. It has not been cleared or approved by the [place] Food and Drug Administration. This test is used for clinical purposes. It should not be regarded as investigational or for research. This laboratory is certified under the [place] (CLIA) as qualified to perform high complexity clinical laboratory testing. Performed at [facility], expectorated sputum-assessment     Status: None   Collection Time: 10/22/13  6:26 AM  Result Value Ref Range Status   Specimen Description SPUTUM  Final   Special Requests Normal  Final   Sputum evaluation   Final    THIS SPECIMEN IS ACCEPTABLE. RESPIRATORY CULTURE REPORT TO FOLLOW.   Report Status 10/22/2013 FINAL  Final  Culture, respiratory (NON-Expectorated)     Status: None   Collection Time: 10/22/13  6:26 AM  Result Value Ref Range Status   Specimen Description SPUTUM  Final   Special Requests NONE  Final   Gram Stain   Final    FEW WBC PRESENT, PREDOMINANTLY PMN NO SQUAMOUS EPITHELIAL CELLS SEEN FEW GRAM NEGATIVE RODS RARE GRAM POSITIVE COCCI IN PAIRS RARE GRAM POSITIVE RODS   Culture   Final    NORMAL OROPHARYNGEAL FLORA Performed at [facility]   Report Status 10/24/2013 FINAL  Final  Surgical pcr screen     Status: None   Collection Time: 10/25/13  2:54 PM  Result Value Ref Range Status   MRSA, PCR NEGATIVE NEGATIVE Final  Staphylococcus  aureus NEGATIVE NEGATIVE Final    Comment:        The Xpert SA Assay (FDA approved for NASAL specimens in patients over 28 years of age), is one component of a comprehensive surveillance program.  Test performance has been validated by [employer] for patients greater than or equal to 52 year old. It is not intended to diagnose infection nor to guide or monitor treatment.    Coagulation Studies:  Recent Labs  10/08/2014 0756  LABPROT 21.6*  INR 1.86*    Urinalysis: No results for input(s): COLORURINE, LABSPEC, PHURINE, GLUCOSEU, HGBUR, BILIRUBINUR, KETONESUR, PROTEINUR, UROBILINOGEN, NITRITE, LEUKOCYTESUR in the last 168 hours.  Invalid input(s): APPERANCEUR  Lipid Panel:     Component Value Date/Time   CHOL  12/07/2009 0559    112        ATP III CLASSIFICATION:  <200     mg/dL   Desirable  200-239  mg/dL   Borderline High  >=240    mg/dL   High          TRIG 79 12/07/2009 0559   HDL 32* 12/07/2009 0559   CHOLHDL 3.5 12/07/2009 0559   VLDL 16 12/07/2009 0559   LDLCALC  12/07/2009 0559    64        Total Cholesterol/HDL:CHD Risk Coronary Heart Disease Risk Table                     Men   Women  1/2 Average Risk   3.4   3.3  Average Risk       5.0   4.4  2 X Average Risk   9.6   7.1  3 X Average Risk  23.4   11.0        Use the calculated Patient Ratio above and the CHD Risk Table to determine the patient's CHD Risk.        ATP III CLASSIFICATION (LDL):  <100     mg/dL   Optimal  100-129  mg/dL   Near or Above                    Optimal  130-159  mg/dL   Borderline  160-189  mg/dL   High  >190     mg/dL   Very High    HgbA1C: No results found for: HGBA1C  Urine Drug Screen:  No results found for: LABOPIA, COCAINSCRNUR, LABBENZ, AMPHETMU, THCU, LABBARB  Alcohol Level:  Recent Labs Lab 09/20/2014 Ruth <11    Other results:  Imaging: Ct Head Wo Contrast  10/03/2014   CLINICAL DATA:  Lethargy and altered mental status. Dialysis patient. Left  facial droop. Initial encounter.  EXAM: CT HEAD WITHOUT CONTRAST  TECHNIQUE: Contiguous axial images were obtained from the base of the skull through the vertex without intravenous contrast.  COMPARISON:  Head CT 10/28/2012.  FINDINGS: There is no evidence of acute intracranial hemorrhage, mass lesion, brain edema or extra-axial fluid collection. The ventricles and subarachnoid spaces are prominent but stable. There is no CT evidence of acute cortical infarction. There is a stable old lacunar infarct in the right thalamus and stable chronic periventricular white matter disease. There is an old infarct in the high right parietal lobe which appears unchanged. Diffuse intracranial vascular calcifications are noted. The superior orbital veins appear prominent, especially on the right. No other or orbital abnormalities are identified. The cavernous sinus regions appear stable.  The visualized paranasal sinuses, mastoid air  cells and middle ears are clear. The calvarium is intact.  IMPRESSION: 1. No acute intracranial findings. 2. Stable atrophy and chronic small vessel ischemic changes. 3. Prominence of the superior orbital veins of undetermined significance/etiology.   Electronically Signed   By: Camie Patience M.D.   On: 09/29/2014 08:04    Assessment: 79 y.o. male with history of A fib, hypertension, ESRD on HD presenting with left sided weakness and confusion found to have an elevated troponin (2.33) in the ED. Admitted for NSTEMI. Neurology consulted for possible CVA and AMS workup. With history of A fib not on anticoagulation have concern for possible cardioembolic infarct. Question if some of LUE weakness is pain related.  Patient noted to have elevated WBC (18.8) in ED, possible infectious etiology could be contributing to encephalopathy. Based on presentation would not hold heparin for NSTEMI treatment due to question of possible CVA  Stroke Risk Factors - A fib, HTN  Plan: 1. HgbA1c, fasting lipid  panel 2. MRI, MRA  of the brain without contrast 3. PT consult, OT consult, Speech consult 4. Echocardiogram 5. Carotid dopplers 6. Prophylactic therapy-currently on ASA and Brilinta 7. Risk factor modification 8. Telemetry monitoring 9. Frequent neuro checks 10. NPO until RN stroke swallow screen 11. Infectious workup per primary team 12. Check B12, B1, TSH   Jim Like, DO [facility] [phone]  If 7pm- 7am, please page neurology on call as listed in AMION. 09/16/2014, 12:37 PM

## 2014-09-15 NOTE — ED Notes (Signed)
Per Cardiology we will not bolus the Heparin when ordered but will continue with the Heparin.

## 2014-09-15 NOTE — ED Provider Notes (Signed)
CSN: [id number]     Arrival date & time 09/21/2014  1740 History   First MD Initiated Contact with Patient 10/01/2014 [phone]     Chief Complaint  Patient presents with  . Stroke Symptoms     (Consider location/radiation/quality/duration/timing/severity/associated sxs/prior Treatment) HPI   Brad Mcintosh is a 79 y.o. male who is here with the report of weakness left side, associated with confusion, that was noticed prior to his dialysis treatment this morning.  He was transferred here by EMS.  The patient denies any problems and states he is not sure why he is here.  He went to dialysis, from his home this morning by private vehicle, driven by his son.  He states that he was able to walk into dialysis.  He did not eat breakfast this morning.  He denies headache or weakness.  He complains of pain in his left upper arm.  There's been no known trauma.  Level V Caveat- he is a poor historian     Past Medical History  Diagnosis Date  . Hypertension   . Hyperlipidemia   . Cancer     prostate  . Gout   . Anemia     Iron deficiency  . Thyroid disease   . Malnutrition   . Atrial fibrillation     paroxysmal  . Chronic kidney disease     Pt has HD on TUESDAY, THURSDAY and SATURDAY  at [facility]  . Peripheral vascular disease   . Coronary artery disease   . Shortness of breath   . GERD (gastroesophageal reflux disease)   . Aortic stenosis     moderate AS by 10/2013 echo  . Myocardial infarction 10/2013  . Pneumonia 2015  . History of blood transfusion   . Internal hemorrhoids   . Diverticulosis   . Hyperplastic colon polyp   . Anemia    Past Surgical History  Procedure Laterality Date  . Av fistula placement  06/2006    left forearm  Pt denies  . Amputation      right finger  . Incise and drain abcess      chronic complex infection right long finger  . Insertion of dialysis catheter  05/30/2010    right internal jugular Palindrome catheter  . Aortogram  06/01/11    w/ runoff  .  Tee without cardioversion N/A 10/25/2013    Procedure: TRANSESOPHAGEAL ECHOCARDIOGRAM (TEE);  Surgeon: [name], MD;  Location: [place];  Service: Cardiovascular;  Laterality: N/A;  . Insertion of dialysis catheter N/A 10/25/2013    Procedure: INSERTION OF DIALYSIS CATHETER;  Surgeon: Elam Dutch, MD;  Location: [place];  Service: Vascular;  Laterality: N/A;  Attempted both right and left neck, procedure unsucessful.  . Cardiac catheterization    . Toe amputation      bil feet  . Coronary angioplasty  11/09/13    LAD Stented  . Av fistula placement Right 01/31/2014    Procedure: INSERTION OF ARTERIOVENOUS (AV) GORE-TEX GRAFT ARM- RIGHT FOREARM;  Surgeon: Conrad East Burke, MD;  Location: [facility];  Service: Vascular;  Laterality: Right;   Family History  Problem Relation Age of Onset  . Hypertension Mother   . Asthma Father   . Cancer Sister     breast  . Kidney disease Brother   . Diabetes Brother    History  Substance Use Topics  . Smoking status: Former Smoker -- 0.50 packs/day    Types: Cigarettes    Quit  date: 02/17/1972  . Smokeless tobacco: Never Used  . Alcohol Use: No    Review of Systems  Unable to perform ROS     Allergies  Altace  Home Medications   Prior to Admission medications   Medication Sig Start Date End Date Taking? Authorizing Provider  aspirin EC 81 MG EC tablet Take 1 tablet (81 mg total) by mouth daily. 10/26/13  Yes Shanker Kristeen Mans, MD  calcium acetate (PHOSLO) 667 MG capsule Take 667 mg by mouth 3 (three) times daily with meals.  06/20/12  Yes Historical Provider, MD  gabapentin (NEURONTIN) 100 MG capsule Take 100 mg by mouth 3 (three) times daily.  01/22/14  Yes Historical Provider, MD  omeprazole (PRILOSEC) 40 MG capsule Take 40 mg by mouth daily. 08/18/14  Yes Historical Provider, MD  citalopram (CELEXA) 20 MG tablet Take 20 mg by mouth at bedtime. Depression    Historical Provider, MD  cloNIDine (CATAPRES) 0.3 MG tablet Take 1 tablet (0.3  mg total) by mouth at bedtime. 11/10/13   [name], MD  colchicine 0.6 MG tablet Take 0.6 mg by mouth daily as needed (gout).     Historical Provider, MD  cyclobenzaprine (FLEXERIL) 10 MG tablet Take 10 mg by mouth 3 (three) times daily as needed for muscle spasms.    Historical Provider, MD  HYDROcodone-acetaminophen (NORCO/VICODIN) 5-325 MG per tablet Take 1 tablet by mouth every 6 (six) hours as needed for moderate pain.     Historical Provider, MD  hydrOXYzine (ATARAX/VISTARIL) 25 MG tablet Take 1 tablet (25 mg total) by mouth every 6 (six) hours as needed for itching. 02/18/14   Clayton Bibles, PA-C  isosorbide mononitrate (IMDUR) 60 MG 24 hr tablet Take 60 mg by mouth daily.  11/22/13   Historical Provider, MD  metoprolol succinate (TOPROL-XL) 100 MG 24 hr tablet Take 100 mg by mouth daily.  01/05/14   Historical Provider, MD  multivitamin (RENA-VIT) TABS tablet Take 1 tablet by mouth daily.    Historical Provider, MD  ondansetron (ZOFRAN-ODT) 4 MG disintegrating tablet Take 4 mg by mouth every 8 (eight) hours as needed for nausea or vomiting.    Historical Provider, MD  pantoprazole (PROTONIX) 40 MG tablet Take 40 mg by mouth daily.    Historical Provider, MD  predniSONE (STERAPRED UNI-PAK) 10 MG tablet Take by mouth daily. Day 1: take 6 tabs.  Day 2: 5 tabs  Day 3: 4 tabs  Day 4: 3 tabs  Day 5: 2 tabs  Day 6: 1 tab 02/18/14   Clayton Bibles, PA-C  simvastatin (ZOCOR) 10 MG tablet Take 10 mg by mouth at bedtime.    Historical Provider, MD  Ticagrelor (BRILINTA) 90 MG TABS tablet Take 1 tablet (90 mg total) by mouth 2 (two) times daily. 11/10/13   [name], MD   BP 121/73 mmHg  Pulse 96  Temp(Src) 97.6 F (36.4 C) (Oral)  Resp 20  SpO2 98% Physical Exam  Constitutional: He appears well-developed and well-nourished.  HENT:  Head: Normocephalic and atraumatic.  Right Ear: External ear normal.  Left Ear: External ear normal.  Eyes: Conjunctivae and EOM are normal. Pupils are equal, round,  and reactive to light.  Neck: Normal range of motion and phonation normal. Neck supple.  Cardiovascular: Normal rate, regular rhythm and normal heart sounds.   Vascular fistula, right upper arm has normal thrill.  He is hypotensive.  Pulmonary/Chest: Effort normal and breath sounds normal. He exhibits no bony tenderness.  Abdominal:  Soft. There is no tenderness.  Genitourinary:  Nursing noted red blood in his underwear. HE has a perianal rash with excoriations, as the likely cause of external bleeding. On rectal exam, he has thin brown stool.  Musculoskeletal: Normal range of motion.  There is pain with movement of the left upper arm, which localizes to the upper humerus.  There is no deformity of the left arm.  Neurological: He is alert. No cranial nerve deficit or sensory deficit. He exhibits normal muscle tone. Coordination normal.  Oriented to person only.  No dysarthria or aphasia.  No nystagmus.  Normal grip strength, hands, bilaterally.  No ataxia.  Skin: Skin is warm, dry and intact.  Psychiatric: He has a normal mood and affect. His behavior is normal.  Nursing note and vitals reviewed.   ED Course  Procedures (including critical care time)  07:10-  Consideration for treatment with TPA : he is alert, conversant, confused.  There is no focal abnormality.  He does not meet criteria for thrombolysis.  Mild hypotension on arrival, treated with IV fluid bolus- delayed for lack of access  08:30- Troponin is elevated, ASA ordered. Pt states   Medications  heparin ADULT infusion 100 units/mL (25000 units/250 mL) (1,000 Units/hr Intravenous New Bag/Given 09/18/2014 1304)  sodium chloride 0.9 % bolus 250 mL (0 mLs Intravenous Stopped 09/14/2014 1244)  aspirin suppository 300 mg (300 mg Rectal Given 09/18/2014 0943)    Patient Vitals for the past 24 hrs:  BP Temp Temp src Pulse Resp SpO2  10/10/2014 1726 121/73 mmHg 97.6 F (36.4 C) Oral 96 20 98 %  09/24/2014 1700 108/75 mmHg - - 90 22 -   10/10/2014 1630 111/76 mmHg - - 90 (!) 30 -  09/18/2014 1600 96/71 mmHg - - 90 13 -  09/20/2014 1530 103/76 mmHg - - 73 (!) 27 -  09/23/2014 1500 105/75 mmHg - - 93 15 -  09/27/2014 1430 107/75 mmHg - - 94 (!) 30 -  09/11/2014 1358 (!) 90/53 mmHg - - 94 22 -  10/09/2014 1353 90/60 mmHg 97.4 F (36.3 C) Oral 90 24 98 %  09/19/2014 1258 100/61 mmHg - - 88 24 98 %  09/18/2014 1215 93/58 mmHg - - 94 23 99 %  10/05/2014 1200 100/60 mmHg - - 95 (!) 27 93 %  09/17/2014 1115 90/59 mmHg - - 94 18 94 %  09/16/2014 1104 107/61 mmHg - - 78 (!) 33 99 %  10/11/2014 1100 107/61 mmHg - - - (!) 27 -  09/19/2014 1030 91/66 mmHg - - - 14 -  09/24/2014 1000 97/58 mmHg - - - - -  10/05/2014 0930 112/78 mmHg - - - 23 -  10/09/2014 0830 98/57 mmHg - - - - -  10/02/2014 0723 (!) 96/54 mmHg - - 79 (!) 28 100 %  09/13/2014 0657 - 98.2 F (36.8 C) Oral 81 24 -   10:10- case discussed with cardiology, Dr. Terrence Dupont. He will see the patient in the ED. He would like Heparin started.  10:15- Attempted bilateral EJ, failed.   Discussed with Stroke MD, Dr. Janann Colonel, he will see in ED.  10:41 AM Reevaluation with update and discussion. After initial assessment and treatment, an updated evaluation reveals patient's son, is here now.  He states that the patient has not been eating well for several days because he has "had a cold".  Also, this morning the patient was weaker than usual, and was having pain in his left arm, when it  was touched, or removed.  The patient remains comfortable, now.  He states that he does not have any pain, while at rest. [place] - Abnormal; Notable for the following:    Prothrombin Time 21.6 (*)    INR 1.86 (*)    All other components within normal limits  CBC - Abnormal; Notable for the following:    WBC 18.8 (*)    Hemoglobin 12.5 (*)    HCT 38.9 (*)    RDW 20.4 (*)    Platelets 141 (*)    All other components within normal limits  DIFFERENTIAL - Abnormal; Notable for  the following:    Neutrophils Relative % 92 (*)    Lymphocytes Relative 5 (*)    Neutro Abs 17.3 (*)    All other components within normal limits  COMPREHENSIVE METABOLIC PANEL - Abnormal; Notable for the following:    Chloride 93 (*)    Glucose, Bld 103 (*)    BUN 35 (*)    Creatinine, Ser 7.16 (*)    Albumin 3.1 (*)    Total Bilirubin 2.9 (*)    GFR calc non Af Amer 7 (*)    GFR calc Af Amer 8 (*)    Anion gap 23 (*)    All other components within normal limits  I-STAT TROPOININ, ED - Abnormal; Notable for the following:    Troponin i, poc 2.33 (*)    All other components within normal limits  ETHANOL  APTT  URINE RAPID DRUG SCREEN (HOSP PERFORMED)  URINALYSIS, ROUTINE W REFLEX MICROSCOPIC  HEPARIN LEVEL (UNFRACTIONATED)  HEPARIN LEVEL (UNFRACTIONATED)  CBC    Imaging Review Ct Head Wo Contrast  09/23/2014   CLINICAL DATA:  Lethargy and altered mental status. Dialysis patient. Left facial droop. Initial encounter.  EXAM: CT HEAD WITHOUT CONTRAST  TECHNIQUE: Contiguous axial images were obtained from the base of the skull through the vertex without intravenous contrast.  COMPARISON:  Head CT 10/28/2012.  FINDINGS: There is no evidence of acute intracranial hemorrhage, mass lesion, brain edema or extra-axial fluid collection. The ventricles and subarachnoid spaces are prominent but stable. There is no CT evidence of acute cortical infarction. There is a stable old lacunar infarct in the right thalamus and stable chronic periventricular white matter disease. There is an old infarct in the high right parietal lobe which appears unchanged. Diffuse intracranial vascular calcifications are noted. The superior orbital veins appear prominent, especially on the right. No other or orbital abnormalities are identified. The cavernous sinus regions appear stable.  The visualized paranasal sinuses, mastoid air cells and middle ears are clear. The calvarium is intact.  IMPRESSION: 1. No acute  intracranial findings. 2. Stable atrophy and chronic small vessel ischemic changes. 3. Prominence of the superior orbital veins of undetermined significance/etiology.   Electronically Signed   By: Camie Patience M.D.   On: 09/13/2014 08:04     EKG Interpretation None        Date: 09/24/2014  Rate: 89  Rhythm: atrial fibrillation  QRS Axis: normal  PR and QT Intervals: QT prolonged  ST/T Wave abnormalities: nonspecific T wave changes  PR and QRS Conduction Disutrbances:none  Narrative Interpretation:   Old EKG Reviewed: changes noted- since 11/09/13   MDM   Final diagnoses:  ESRD (end stage renal disease)  Perianal rash  Acute coronary syndrome  Confusion    Nonspecific confusion without evidence for acute CVA. Incidental Troponin elevation c/w ACS,  NSTEMI. Rectal bleeding is likely an external source. He will need to be admitted for further treatment and observation.  Nursing Notes Reviewed/ Care Coordinated Applicable Imaging Reviewed Interpretation of Laboratory Data incorporated into ED treatment    Richarda Blade, MD 09/27/2014 [phone]

## 2014-09-15 NOTE — ED Notes (Signed)
Patient transported to CT 

## 2014-09-15 NOTE — ED Notes (Signed)
Cardiology at bedside.

## 2014-09-15 NOTE — ED Notes (Signed)
At bedside with Dr. Eulis Foster checking bottom of patient to confirm location of blood.  Blood appears to be external on examination.

## 2014-09-15 NOTE — ED Notes (Signed)
Family at bedside. 

## 2014-09-15 NOTE — Procedures (Signed)
I was present at this dialysis session, have reviewed the session itself and made  appropriate changes  Kelly Splinter MD (pgr) [phone]    [phone] 10/02/2014, 3:18 PM

## 2014-09-15 NOTE — ED Notes (Signed)
Patients pants removed and blood was noted in the underwear and on his bottom.

## 2014-09-15 NOTE — ED Notes (Signed)
Neurology to consult first prior to Heparin infusion.   Dr. Janann Colonel to verify with Sharyn Lull in Pharmacy prior to starting Heparin infusion due to contraindications.  This RN spoke to [place] to verify.

## 2014-09-15 NOTE — ED Notes (Signed)
Dr. [name] with Dialysis at bedside.

## 2014-09-15 NOTE — Progress Notes (Addendum)
ANTICOAGULATION CONSULT NOTE - Initial Consult  Pharmacy Consult for heparin Indication: chest pain/ACS  Allergies  Allergen Reactions  . Altace [Ramipril] Other (See Comments) and Cough    Also renal failure    Patient Measurements:    Weight: 85.7 kg on 02/16/14  Vital Signs: Temp: 98.2 F (36.8 C) (12/05 0657) Temp Source: Oral (12/05 0657) BP: 107/61 mmHg (12/05 1104) Pulse Rate: 78 (12/05 1104)  Labs:  Recent Labs  09/14/2014 0756  HGB 12.5*  HCT 38.9*  PLT 141*  APTT 35  LABPROT 21.6*  INR 1.86*  CREATININE 7.16*    CrCl cannot be calculated (Unknown ideal weight.).   Medical History: Past Medical History  Diagnosis Date  . Hypertension   . Hyperlipidemia   . Cancer     prostate  . Gout   . Anemia     Iron deficiency  . Thyroid disease   . Malnutrition   . Atrial fibrillation     paroxysmal  . Chronic kidney disease     Pt has HD on TUESDAY, THURSDAY and SATURDAY  at [facility]  . Peripheral vascular disease   . Coronary artery disease   . Shortness of breath   . GERD (gastroesophageal reflux disease)   . Aortic stenosis     moderate AS by 10/2013 echo  . Myocardial infarction 10/2013  . Pneumonia 2015  . History of blood transfusion   . Internal hemorrhoids   . Diverticulosis   . Hyperplastic colon polyp   . Anemia    Assessment: 79 yo M with confusion, L sided weakness and elevated troponin. ESRD TTS HD.  S/p HD this morning.  Admitted with STEMI and ? cardioembolic stroke.  Head CT negative for bleed.  RN noted blood in underwear, most likely from rash on his bottom.  INR 1.86 and pt is not on anticoagulants.  PLTC low at 141.  Hg 12.5.  Awaiting fax to complete med history, but probably on asa 4 and Brilinta.  Troponin + at 2.33.  EKG: afib with controlled VR and ST-T wave changes in anterolateral leads which were new.  Pt denies chest pain.   Goal: HL 0.3 - 0.5 per low dose request of Dr. Terrence Dupont Monitor pltc and CBC  Plan:  D/w Dr  Terrence Dupont. I am concerned about low pltc, blood in underwear, INR of 1.85 and not on coumadin and possible ischemic stroke - in which early anticoagulation is contra-indicated.  He stated to wait until pt seen by neuro.  Eudelia Bunch, Pharm.D. [phone] 09/19/2014 11:44 AM    Addendum: D/w Dr. Eulis Foster who discussed pt with both Dr. Janann Colonel of neuro and Dr. Terrence Dupont of cards.   OK to anticoagulate from neuro standpoint per neuro consult.  Dr. Terrence Dupont wants low dose heparin drip with no bolus.  Plan: -no bolus, start heparin drip at 1000 units/hr and check 8 hr HL -daily CBC and daily HL  Eudelia Bunch, Pharm.D. [phone] 10/05/2014 12:53 PM   Addendum: Vancomycin and Zosyn to start for sepsis. Plan: vancomycin 1.5g IV x 1 dose, then 750mg  after each dialysis session Zosyn 2.25g IV q8h  Heide Guile, PharmD, BCPS Clinical Pharmacist Pager [phone]

## 2014-09-16 ENCOUNTER — Inpatient Hospital Stay (HOSPITAL_COMMUNITY): Payer: Medicare Other

## 2014-09-16 DIAGNOSIS — R531 Weakness: Secondary | ICD-10-CM

## 2014-09-16 DIAGNOSIS — I480 Paroxysmal atrial fibrillation: Secondary | ICD-10-CM

## 2014-09-16 DIAGNOSIS — M79603 Pain in arm, unspecified: Secondary | ICD-10-CM

## 2014-09-16 DIAGNOSIS — I249 Acute ischemic heart disease, unspecified: Secondary | ICD-10-CM

## 2014-09-16 LAB — CBC WITH DIFFERENTIAL/PLATELET
BASOS ABS: 0 10*3/uL (ref 0.0–0.1)
Basophils Relative: 0 % (ref 0–1)
EOS ABS: 0 10*3/uL (ref 0.0–0.7)
Eosinophils Relative: 0 % (ref 0–5)
HEMATOCRIT: 36.9 % — AB (ref 39.0–52.0)
Hemoglobin: 12.2 g/dL — ABNORMAL LOW (ref 13.0–17.0)
LYMPHS ABS: 0.3 10*3/uL — AB (ref 0.7–4.0)
Lymphocytes Relative: 2 % — ABNORMAL LOW (ref 12–46)
MCH: 29.3 pg (ref 26.0–34.0)
MCHC: 33.1 g/dL (ref 30.0–36.0)
MCV: 88.5 fL (ref 78.0–100.0)
Monocytes Absolute: 1.6 10*3/uL — ABNORMAL HIGH (ref 0.1–1.0)
Monocytes Relative: 9 % (ref 3–12)
NEUTROS ABS: 15.5 10*3/uL — AB (ref 1.7–7.7)
Neutrophils Relative %: 89 % — ABNORMAL HIGH (ref 43–77)
PLATELETS: 110 10*3/uL — AB (ref 150–400)
RBC: 4.17 MIL/uL — ABNORMAL LOW (ref 4.22–5.81)
RDW: 20.2 % — AB (ref 11.5–15.5)
WBC: 17.4 10*3/uL — ABNORMAL HIGH (ref 4.0–10.5)

## 2014-09-16 LAB — LIPID PANEL
CHOL/HDL RATIO: 3.8 ratio
Cholesterol: 79 mg/dL (ref 0–200)
HDL: 21 mg/dL — ABNORMAL LOW (ref >39)
LDL CALC: 46 mg/dL (ref 0–99)
Triglycerides: 59 mg/dL (ref <150)
VLDL: 12 mg/dL (ref 0–40)

## 2014-09-16 LAB — BASIC METABOLIC PANEL
Anion gap: 20 — ABNORMAL HIGH (ref 5–15)
Anion gap: 19 — ABNORMAL HIGH (ref 5–15)
BUN: 24 mg/dL — ABNORMAL HIGH (ref 6–23)
BUN: 32 mg/dL — ABNORMAL HIGH (ref 6–23)
CO2: 23 mEq/L (ref 19–32)
CO2: 22 meq/L (ref 19–32)
CREATININE: 3.26 mg/dL — AB (ref 0.50–1.35)
Calcium: 8.8 mg/dL (ref 8.4–10.5)
Calcium: 8.4 mg/dL (ref 8.4–10.5)
Chloride: 99 mEq/L (ref 96–112)
Chloride: 94 mEq/L — ABNORMAL LOW (ref 96–112)
Creatinine, Ser: 4.89 mg/dL — ABNORMAL HIGH (ref 0.50–1.35)
GFR calc Af Amer: 12 mL/min — ABNORMAL LOW (ref >90)
GFR calc Af Amer: 20 mL/min — ABNORMAL LOW (ref >90)
GFR calc non Af Amer: 10 mL/min — ABNORMAL LOW (ref >90)
GFR, EST NON AFRICAN AMERICAN: 17 mL/min — AB (ref >90)
GLUCOSE: 117 mg/dL — AB (ref 70–99)
GLUCOSE: 201 mg/dL — AB (ref 70–99)
POTASSIUM: 4.2 meq/L (ref 3.7–5.3)
Potassium: 3.8 mEq/L (ref 3.7–5.3)
Sodium: 142 mEq/L (ref 137–147)
Sodium: 135 mEq/L — ABNORMAL LOW (ref 137–147)

## 2014-09-16 LAB — GLUCOSE, CAPILLARY
GLUCOSE-CAPILLARY: 123 mg/dL — AB (ref 70–99)
GLUCOSE-CAPILLARY: 145 mg/dL — AB (ref 70–99)
Glucose-Capillary: 117 mg/dL — ABNORMAL HIGH (ref 70–99)
Glucose-Capillary: 78 mg/dL (ref 70–99)
Glucose-Capillary: 91 mg/dL (ref 70–99)

## 2014-09-16 LAB — CBC
HCT: 37.8 % — ABNORMAL LOW (ref 39.0–52.0)
Hemoglobin: 12.3 g/dL — ABNORMAL LOW (ref 13.0–17.0)
MCH: 29.6 pg (ref 26.0–34.0)
MCHC: 32.5 g/dL (ref 30.0–36.0)
MCV: 90.9 fL (ref 78.0–100.0)
PLATELETS: 118 10*3/uL — AB (ref 150–400)
RBC: 4.16 MIL/uL — ABNORMAL LOW (ref 4.22–5.81)
RDW: 20.2 % — AB (ref 11.5–15.5)
WBC: 17 10*3/uL — AB (ref 4.0–10.5)

## 2014-09-16 LAB — PROTIME-INR
INR: 1.75 — ABNORMAL HIGH (ref 0.00–1.49)
INR: 2.89 — ABNORMAL HIGH (ref 0.00–1.49)
PROTHROMBIN TIME: 20.6 s — AB (ref 11.6–15.2)
Prothrombin Time: 30.5 seconds — ABNORMAL HIGH (ref 11.6–15.2)

## 2014-09-16 LAB — TROPONIN I
Troponin I: 2.3 ng/mL (ref <0.30)
Troponin I: 2.37 ng/mL (ref <0.30)
Troponin I: 2.59 ng/mL (ref <0.30)

## 2014-09-16 LAB — HEMOGLOBIN A1C
Hgb A1c MFr Bld: 5.4 % (ref <5.7)
Hgb A1c MFr Bld: 5.4 % (ref <5.7)
Mean Plasma Glucose: 108 mg/dL (ref <117)
Mean Plasma Glucose: 108 mg/dL (ref <117)

## 2014-09-16 LAB — HEPARIN LEVEL (UNFRACTIONATED): Heparin Unfractionated: >2.2 IU/mL — ABNORMAL HIGH (ref 0.30–0.70)

## 2014-09-16 MED ORDER — SODIUM CHLORIDE 0.9 % IV BOLUS (SEPSIS)
250.0000 mL | Freq: Once | INTRAVENOUS | Status: AC
  Administered 2014-09-16: 250 mL via INTRAVENOUS

## 2014-09-16 MED ORDER — SODIUM CHLORIDE 0.9 % IV BOLUS (SEPSIS)
250.0000 mL | Freq: Once | INTRAVENOUS | Status: AC
Start: 2014-09-16 — End: 2014-09-16
  Administered 2014-09-16: 250 mL via INTRAVENOUS

## 2014-09-16 MED ORDER — DEXTROSE 5 % IV SOLN
0.0000 ug/min | INTRAVENOUS | Status: DC
  Administered 2014-09-16: 2 ug/min via INTRAVENOUS
  Administered 2014-09-18: 7 ug/min via INTRAVENOUS
  Filled 2014-09-16 (×3): qty 16

## 2014-09-16 MED ORDER — PHENTOLAMINE MESYLATE 5 MG IJ SOLR: 10.0000 mg | Freq: Once | INTRAMUSCULAR | Status: DC

## 2014-09-16 NOTE — Progress Notes (Signed)
Patient handed off to second shift from this morning's pharmacist. Lab unable to obtain heparin level and we have not been able to confirm current rate of 1250 units/hr.  Noted RN note with verbal order from Dr. Terrence Dupont to skip today's labs and try in the morning.  Pharmacy to continue to follow- if unable to monitor heparin accurately, will need to change medication management. Since patient is ESRD, a NOAC is not an option. Could use Lovenox + warfarin if nephrology ok with it (we could get levels prior to HD)  Irvan Tiedt D. Tymon Nemetz, PharmD, BCPS Clinical Pharmacist Pager: [phone] 09/16/2014 5:46 PM

## 2014-09-16 NOTE — Progress Notes (Signed)
STROKE TEAM PROGRESS NOTE   HISTORY Brad Mcintosh is an 79 y.o. male who is here with the report of weakness left side, associated with confusion, that was noticed prior to his dialysis treatment this morning. He was transferred here by EMS. The patient denies any problems and states he is not sure why he is here. He went to dialysis, from his home this morning by private vehicle, driven by his son. He states that he was able to walk into dialysis. He did not eat breakfast this morning. He denies headache or weakness. He complains of pain in his left upper arm. There's been no known trauma.  Unable to reach family so no further information available. Per review of records he has history of HTN, paroxysmal A fib (on ASA and brilinta), ESRD on HD.   Date last known well: 09/14/2014 Time last known well: 2100 tPA Given: no, outside window  SUBJECTIVE (INTERVAL HISTORY) His RN is at the bedside.  Overall he feels his condition is unchanged. His BP was very labile. SBP was between 65 to 110. He is on dopamine drip and heparin drip. MRI not able to be done due to the low BP. He has bilateral arm pain, tender to touch. Not able to test muscle strength. He is confused and not able to obtain further history and not cooperative on exam.  OBJECTIVE Temp:  [97.6 F (36.4 C)-100.8 F (38.2 C)] 98.7 F (37.1 C) (12/06 1108) Pulse Rate:  [30-128] 104 (12/06 1517) Cardiac Rhythm:  [-] Normal sinus rhythm (12/06 0800) Resp:  [11-33] 22 (12/06 1517) BP: (61-123)/(34-95) 98/63 mmHg (12/06 1517) SpO2:  [83 %-100 %] 100 % (12/06 1517) FiO2 (%):  [28 %] 28 % (12/05 1902) Weight:  [182 lb 3.2 oz (82.645 kg)-182 lb 12.2 oz (82.9 kg)] 182 lb 12.2 oz (82.9 kg) (12/06 0030)   Recent Labs Lab 09/23/2014 2047 09/13/2014 2116 09/16/14 0041 09/16/14 0805 09/16/14 1112  GLUCAP 109* 129* 91 123* 117*    Recent Labs Lab 10/08/2014 0756 09/13/2014 2257 09/16/14 0527  NA 140  --  142  K 4.9  --  4.2  CL 93*   --  99  CO2 24  --  23  GLUCOSE 103*  --  117*  BUN 35*  --  24*  CREATININE 7.16*  --  4.89*  CALCIUM 10.3  --  8.8  MG  --  1.7  --     Recent Labs Lab 09/26/2014 0756  AST 27  ALT 17  ALKPHOS 57  BILITOT 2.9*  PROT 6.9  ALBUMIN 3.1*    Recent Labs Lab 09/21/2014 0756 09/16/14 0527  WBC 18.8* 17.0*  NEUTROABS 17.3*  --   HGB 12.5* 12.3*  HCT 38.9* 37.8*  MCV 89.6 90.9  PLT 141* 118*    Recent Labs Lab 09/16/2014 2103 09/16/14 0527  TROPONINI 2.16* 2.30*    Recent Labs  09/14/2014 0756 09/16/14 0527  LABPROT 21.6* 20.6*  INR 1.86* 1.75*   No results for input(s): COLORURINE, LABSPEC, PHURINE, GLUCOSEU, HGBUR, BILIRUBINUR, KETONESUR, PROTEINUR, UROBILINOGEN, NITRITE, LEUKOCYTESUR in the last 72 hours.  Invalid input(s): APPERANCEUR     Component Value Date/Time   CHOL 79 09/16/2014 0527   TRIG 59 09/16/2014 0527   HDL 21* 09/16/2014 0527   CHOLHDL 3.8 09/16/2014 0527   VLDL 12 09/16/2014 0527   LDLCALC 46 09/16/2014 0527   Lab Results  Component Value Date   HGBA1C 5.4 09/16/2014   No results found for:  LABOPIA, COCAINSCRNUR, LABBENZ, AMPHETMU, THCU, LABBARB   Recent Labs Lab 09/20/2014 0756  ETH <11    Ct Head Wo Contrast  09/19/2014   IMPRESSION: 1. No acute intracranial findings. 2. Stable atrophy and chronic small vessel ischemic changes. 3. Prominence of the superior orbital veins of undetermined significance/etiology.    Dg Chest Port 1 View  09/22/2014  IMPRESSION: Low lung volumes.  No evidence of acute cardiopulmonary disease.     CUS - There is 1-39% left ICA stenosis. Unable to scan right side secondary to patient's refusal to turn head and confusion.   2D echo - pending report.    Component     Latest Ref Rng 09/29/2014 09/16/2014  Cholesterol     0 - 200 mg/dL  79  Triglycerides     <150 mg/dL  59  HDL     >39 mg/dL  21 (L)  Total CHOL/HDL Ratio       3.8  VLDL     0 - 40 mg/dL  12  LDL (calc)     0 - 99 mg/dL  46  Hgb  A1c MFr Bld     <5.7 % 5.4 5.4  Mean Plasma Glucose     <117 mg/dL 108 108  Troponin I     <0.30 ng/mL 2.16 (HH) 2.30 (HH)  TSH     0.350 - 4.500 uIU/mL 1.100     PHYSICAL EXAM  Temp:  [97.6 F (36.4 C)-100.8 F (38.2 C)] 98.7 F (37.1 C) (12/06 1108) Pulse Rate:  [30-128] 104 (12/06 1517) Resp:  [11-33] 22 (12/06 1517) BP: (61-123)/(34-95) 98/63 mmHg (12/06 1517) SpO2:  [83 %-100 %] 100 % (12/06 1517) FiO2 (%):  [28 %] 28 % (12/05 1902) Weight:  [182 lb 3.2 oz (82.645 kg)-182 lb 12.2 oz (82.9 kg)] 182 lb 12.2 oz (82.9 kg) (12/06 0030)  General - cachectic, well developed, on face mask for O2 and disorientated.  Ophthalmologic - not able to perform due to incooperation.  Cardiovascular - in and out of afib rhythm.  Neuro - awake and alert but confused, not able to tell the time place and people but orientated to self. Paucity of speech, not able to answer other questions. Follows a couple of simple commands and then refused to follow commands. Blinking to visual threat bilaterally but not moving eyes as requested. Spontaneously moving eyes to both horizontal directions. PERRL. Facial symmetrical, tongue in middle, very dysarthric when he talks. Bilateral UE tender to touch, R>L, not able to test muscle strength. BLE withdraw to pain and able to against gravity. Reflex diminished and no babinski.  ASSESSMENT/PLAN Brad Mcintosh is a 79 y.o. male with history of afib not on [facility], CAD and MI s/p stent 10/2013 on ASA and brilinta, HTN, ESRD on HD was admitted due to confusion and slurry speech prior to HD session. Questionable left sided weakness likely due to pain.    Generalized weakness and painful UEs - likely related to NSTEMI and unclear etiology for BUE tenderness  Pt has bilaterally UE tender to touch, today R>L, not able to test muscle strength  As per history, he was admitted for generalized weakness, slurry speech  His initially reported left sided weakness seems due to  arm pain  Less supicious for stroke etiology  MRI  pending  MRA  pending  Carotid Doppler  40-59% bilateral ICAs.   2D Echo  pending  LDL 46, at the goal  HgbA1c 5.4 at the goal  On heparin drip now also for VTE prophylaxis  Diet NPO time specified   aspirin 81 mg orally every day and and brilinta prior to admission, now on heparin IV  NSTEMI - elevated troponin, but he has ESRD and the current level is much less than in 10/2013.  - cardiology managing - on heparin drip - continue to trend troponin  Intermittent Afib - in and out of afib rhythm - on heparin drip - once stable, progress to coumadin bridge with lovenox or think about NOAC - rate control.  Bilateral UE pain - unclear etiology - R>L - recommend X-ray of bilateral shoulder.  CAD and MI s/p stent - on ASA and brilinta prior to admission - currently on heparin drip  Hypotension  On dopamine drip  Once stable, may consider MRI  Avoid hypotension  ESRD on HD - nephrology on board - management per nephrology  Other Stroke Risk Factors  Advanced age  Hospital day # 1  Rosalin Hawking, MD PhD Stroke Neurology 09/16/2014 4:16 PM    To contact Stroke Continuity provider, please refer to [email or web address] After hours, contact General Neurology

## 2014-09-16 NOTE — Progress Notes (Signed)
Anterior IV with Dopamine and Levophed found with infiltration- large surround tissue. IV Team called for consult and for another site. IV team is calling this a state III infiltration. As soon as I switched sites the patients HR and B/P responded where I could start decreasing rate of levophed and dopamine. Dr.Harwani notified of situation and will adm.regitine as ordered.

## 2014-09-16 NOTE — Progress Notes (Signed)
VASCULAR LAB PRELIMINARY  PRELIMINARY  PRELIMINARY  PRELIMINARY  Left Carotid Dopplers completed.    Preliminary report:  There is 1-39% left ICA stenosis.  Unable to scan right side secondary to patient's refusal to turn head and confusion.    Lando Alcalde, RVT 09/16/2014, 12:33 PM

## 2014-09-16 NOTE — Progress Notes (Signed)
ANTICOAGULATION CONSULT NOTE - Follow Up Consult  Pharmacy Consult for heparin Indication: NSTEMI in setting of probable CVA  Labs:  Recent Labs  10/08/2014 0756 09/17/2014 2103 10/05/2014 2257  HGB 12.5*  --   --   HCT 38.9*  --   --   PLT 141*  --   --   APTT 35  --   --   LABPROT 21.6*  --   --   INR 1.86*  --   --   HEPARINUNFRC  --   --  <0.10*  CREATININE 7.16*  --   --   TROPONINI  --  2.16*  --      Assessment: 79yo male undetectable on heparin with initial dosing for NSTEMI.  Goal of Therapy:  Heparin level 0.3-0.5 units/ml   Plan:  Will increase heparin gtt by 3 units/kg/hr to 1250 units/hr and check level in [place], PharmD, BCPS  09/16/2014,12:12 AM

## 2014-09-16 NOTE — Progress Notes (Signed)
Lab unable to draw blood off pt.  Dr. Terrence Dupont notified.  Ordered OK to hold off until AM labs and then try again.

## 2014-09-16 NOTE — Progress Notes (Signed)
Due to unavailable Regitine. Will elevated arm on pillow and apply cold packs to site.

## 2014-09-16 NOTE — Progress Notes (Signed)
Utilization Review Completed.Corrin Sieling T12/03/2014  

## 2014-09-16 NOTE — Progress Notes (Signed)
  [facility] Progress Note   Subjective: no complaints  Filed Vitals:   09/16/14 0600 09/16/14 0700 09/16/14 0800 09/16/14 0830  BP: 97/58 75/50 82/53  85/59  Pulse: 65 100 100 102  Temp:   98.5 F (36.9 C)   TempSrc:      Resp: 26 33 25 24  Height:      Weight:      SpO2: 98% 100% 100% 100%   General:Lethargic /aroused to voice, chronically ill Adult BM, NAD HEENT:Brad Mcintosh , MM dry Neck:neck supple Heart: Irreg , irreg VR 90 on moniter, 2/6 HSEM lsb and Aortic reg Lungs: grossly CTA  Abdomen: BS pos. Soft NT, ND Extremities: No pedal edema Skin: No overt rash , dry Skin lower extrem , mild excoruiations on lower legs Neuro: slurred speech, L arm and leg 3/5 strength Dialysis Access: Pos bruit R FA AVG  Dialysis Orders: Center: [facility] on TTS . EDW 85 kg HD Bath 2k, 2.25ca Time 4hrs Heparin 10,000. Access lua avf BFR 400 DFR 800  Hectorol 6 mcg IV/HD Aranesp 33mcg q thurs Units IV/HD Venofer 100mg  q weekly hd  Other Op labs 12-03= 11.3 hgb, pth 924 08-04-14  Assessment/Plan 1. CVA- Neurology seeing, for MRI if BP stabilizes 2. Nstemi / Hx CAD w occluded RCA and stents to LAD/LCx-  per Card 3. ESRD - HD TTS  4. HTN/vol - on clon/ MTP at home. Below dry wt, CXR negative, no extra vol on exam.  BP's low here, worse after HD yest despite no fluid off.  5. Anemia - HGb12.5 hold esa continue weekly fe q thurs hd 6. Metabolic bone disease - hectorol 42mcg q hd and binder when eating 7. A. Fib -per Dr. Terrence Dupont  8. DM - per admit  Plan- give fluid bolus 250 x 3 to get BP up as tolerated.     Kelly Splinter MD  pager [phone]    cell [phone]  09/16/2014, 9:13 AM     Recent Labs Lab 10/10/2014 0756 09/16/14 0527  NA 140 142  K 4.9 4.2  CL 93* 99  CO2 24 23  GLUCOSE 103* 117*  BUN 35* 24*  CREATININE 7.16* 4.89*  CALCIUM 10.3 8.8    Recent Labs Lab 10/11/2014 0756  AST 27  ALT 17  ALKPHOS 57  BILITOT 2.9*  PROT 6.9  ALBUMIN  3.1*    Recent Labs Lab 10/07/2014 0756 09/16/14 0527  WBC 18.8* 17.0*  NEUTROABS 17.3*  --   HGB 12.5* 12.3*  HCT 38.9* 37.8*  MCV 89.6 90.9  PLT 141* 118*   . aspirin EC  81 mg Oral Daily  . atorvastatin  40 mg Oral q1800  . calcium acetate  667 mg Oral TID WC  . insulin aspart  0-9 Units Subcutaneous TID WC  . metoprolol tartrate  12.5 mg Oral BID  . multivitamin  1 tablet Oral QHS  . pantoprazole  40 mg Oral Daily  . piperacillin-tazobactam (ZOSYN)  IV  2.25 g Intravenous 3 times per day  . ticagrelor  90 mg Oral BID  . [START ON 09/18/2014] vancomycin  750 mg Intravenous Q T,Th,Sa-HD   . sodium chloride 10 mL/hr at 09/16/14 0800  . DOPamine 5 mcg/kg/min (09/16/14 4196)  . heparin 1,250 Units/hr (09/16/14 0800)   acetaminophen, nitroGLYCERIN, ondansetron (ZOFRAN) IV, ondansetron, senna-docusate

## 2014-09-16 NOTE — Evaluation (Signed)
Speech Language Pathology Evaluation Patient Details Name: Brad Mcintosh MRN: [id number] DOB: Jan 03, 1935 Today's Date: 09/16/2014 Time: 4818-5631 SLP Time Calculation (min) (ACUTE ONLY): 15 min  Problem List:  Patient Active Problem List   Diagnosis Date Noted  . Acute coronary syndrome 09/23/2014  . End stage renal disease 11/20/2013  . Mechanical complication of other vascular device, implant, and graft 11/20/2013  . CAD (coronary artery disease) 11/09/2013  . Melena 10/23/2013  . Anemia in CKD (chronic kidney disease) 10/23/2013  . Acute respiratory failure with hypoxia 10/23/2013  . LLL pneumonia 10/23/2013  . Demand ischemia 10/23/2013  . Systolic murmur 49/70/2637  . Hypothyroidism 10/23/2013  . Sepsis 10/21/2013  . Pain in limb-Bilateral foot 08/16/2013  . CKD (chronic kidney disease) stage V requiring chronic dialysis 12/02/2012  . Peripheral vascular disease, unspecified 08/10/2012  . Atherosclerotic peripheral vascular disease with ulceration 02/17/2012   Past Medical History:  Past Medical History  Diagnosis Date  . Hypertension   . Hyperlipidemia   . Cancer     prostate  . Gout   . Anemia     Iron deficiency  . Thyroid disease   . Malnutrition   . Atrial fibrillation     paroxysmal  . Chronic kidney disease     Pt has HD on TUESDAY, THURSDAY and SATURDAY  at [facility]  . Peripheral vascular disease   . Coronary artery disease   . Shortness of breath   . GERD (gastroesophageal reflux disease)   . Aortic stenosis     moderate AS by 10/2013 echo  . Myocardial infarction 10/2013  . Pneumonia 2015  . History of blood transfusion   . Internal hemorrhoids   . Diverticulosis   . Hyperplastic colon polyp   . Anemia    Past Surgical History:  Past Surgical History  Procedure Laterality Date  . Av fistula placement  06/2006    left forearm  Pt denies  . Amputation      right finger  . Incise and drain abcess      chronic complex infection right long  finger  . Insertion of dialysis catheter  05/30/2010    right internal jugular Palindrome catheter  . Aortogram  06/01/11    w/ runoff  . Tee without cardioversion N/A 10/25/2013    Procedure: TRANSESOPHAGEAL ECHOCARDIOGRAM (TEE);  Surgeon: [name], MD;  Location: [place];  Service: Cardiovascular;  Laterality: N/A;  . Insertion of dialysis catheter N/A 10/25/2013    Procedure: INSERTION OF DIALYSIS CATHETER;  Surgeon: Elam Dutch, MD;  Location: [place];  Service: Vascular;  Laterality: N/A;  Attempted both right and left neck, procedure unsucessful.  . Cardiac catheterization    . Toe amputation      bil feet  . Coronary angioplasty  11/09/13    LAD Stented  . Av fistula placement Right 01/31/2014    Procedure: INSERTION OF ARTERIOVENOUS (AV) GORE-TEX GRAFT ARM- RIGHT FOREARM;  Surgeon: Conrad Fenton, MD;  Location: [facility] OR;  Service: Vascular;  Laterality: Right;   HPI:  79 y.o. male with history of A fib, hypertension, ESRD on HD presenting with left sided weakness and confusion found to have an elevated troponin (2.33) in the ED. Admitted for NSTEMI.  Transferred to CCU 12/5 because of hypotension.  MRI pending.     Assessment / Plan / Recommendation Clinical Impression  Pt presents with cogntive-linguistic deficits characterized by inattention, verbal perseverations with difficulty shifting attention appropriately.  Pt is able  to follow basic one-step commands with 70% accuracy when in a predictable context.  Generally alert but required frequent prompts to attend and respond to examiner.  Speech is generally intelligible, but content is limited.  Rec SLP f/u for cognition and further assessment as MS allows.     SLP Assessment  Patient needs continued Speech Lanaguage Pathology Services    Follow Up Recommendations   (tba)    Frequency and Duration min 2x/week  2 weeks   Pertinent Vitals/Pain Pain Assessment: No/denies pain   SLP Goals  Potential to Achieve Goals  (ACUTE ONLY): Fair  SLP Evaluation Prior Functioning  Cognitive/Linguistic Baseline: Information not available   Cognition  Overall Cognitive Status: Impaired/Different from baseline Arousal/Alertness: Awake/alert Orientation Level: Oriented to person Attention: Focused Focused Attention: Impaired Focused Attention Impairment: Verbal basic;Functional basic Memory: Impaired Memory Impairment: Storage deficit;Retrieval deficit Awareness: Impaired Awareness Impairment: Intellectual impairment Problem Solving: Impaired Problem Solving Impairment: Verbal basic Behaviors: Impulsive Safety/Judgment: Impaired    Comprehension  Auditory Comprehension Overall Auditory Comprehension: Impaired Yes/No Questions: Impaired Basic Biographical Questions: 51-75% accurate Commands: Impaired One Step Basic Commands: 50-74% accurate Conversation: Simple Visual Recognition/Discrimination Discrimination: Not tested Reading Comprehension Reading Status: Not tested    Expression Expression Primary Mode of Expression: Verbal Verbal Expression Automatic Speech: Name Level of Generative/Spontaneous Verbalization: Conversation   Oral / Motor Oral Motor/Sensory Function Overall Oral Motor/Sensory Function: Appears within functional limits for tasks assessed (no cranial nerve deficits id'd; pt with asymmetric mustache ) Motor Speech Overall Motor Speech: Appears within functional limits for tasks assessed Resonance: Within functional limits Intelligibility: Intelligible   Brad Mcintosh, [place] CCC/SLP Pager [phone]      Brad Mcintosh 09/16/2014, 4:29 PM

## 2014-09-16 NOTE — Progress Notes (Signed)
Subjective:  Patient awake denies any chest pains or shortness of breath. Patient was transferred to CCU last night because of hypotension and was started on dopamine. Patient also spiked fever to 100.8 was started on Vanco and Zosyn pending cultures. Patient was also on chronically steroids which was attributed partly to elevated WBCs.  Objective:  Vital Signs in the last 24 hours: Temp:  [97.4 F (36.3 C)-100.8 F (38.2 C)] 98.5 F (36.9 C) (12/06 0800) Pulse Rate:  [30-128] 102 (12/06 0830) Resp:  [11-33] 24 (12/06 0830) BP: (61-121)/(40-95) 85/59 mmHg (12/06 0830) SpO2:  [83 %-100 %] 100 % (12/06 0830) FiO2 (%):  [28 %] 28 % (12/05 1902) Weight:  [82.645 kg (182 lb 3.2 oz)-82.9 kg (182 lb 12.2 oz)] 82.9 kg (182 lb 12.2 oz) (12/06 0030)  Intake/Output from previous day: 12/05 0701 - 12/06 0700 In: 884 [I.V.:334; IV Piggyback:550] Out: 0  Intake/Output from this shift: Total I/O In: 27.1 [I.V.:27.1] Out: -   Physical Exam: Neck: no adenopathy, no carotid bruit, no JVD and supple, symmetrical, trachea midline Lungs: Decreased breath sound at bases Heart: irregularly irregular rhythm, S1, S2 normal and 2/6 systolic murmur noted Abdomen: soft, non-tender; bowel sounds normal; no masses,  no organomegaly Extremities: extremities normal, atraumatic, no cyanosis or edema  Lab Results:  Recent Labs  09/24/2014 0756 09/16/14 0527  WBC 18.8* 17.0*  HGB 12.5* 12.3*  PLT 141* 118*    Recent Labs  09/27/2014 0756 09/16/14 0527  NA 140 142  K 4.9 4.2  CL 93* 99  CO2 24 23  GLUCOSE 103* 117*  BUN 35* 24*  CREATININE 7.16* 4.89*    Recent Labs  10/03/2014 2103 09/16/14 0527  TROPONINI 2.16* 2.30*   Hepatic Function Panel  Recent Labs  10/03/2014 0756  PROT 6.9  ALBUMIN 3.1*  AST 27  ALT 17  ALKPHOS 57  BILITOT 2.9*    Recent Labs  09/16/14 0527  CHOL 79   No results for input(s): PROTIME in the last 72 hours.  Imaging: Imaging results have been reviewed  and Ct Head Wo Contrast  10/04/2014   CLINICAL DATA:  Lethargy and altered mental status. Dialysis patient. Left facial droop. Initial encounter.  EXAM: CT HEAD WITHOUT CONTRAST  TECHNIQUE: Contiguous axial images were obtained from the base of the skull through the vertex without intravenous contrast.  COMPARISON:  Head CT 10/28/2012.  FINDINGS: There is no evidence of acute intracranial hemorrhage, mass lesion, brain edema or extra-axial fluid collection. The ventricles and subarachnoid spaces are prominent but stable. There is no CT evidence of acute cortical infarction. There is a stable old lacunar infarct in the right thalamus and stable chronic periventricular white matter disease. There is an old infarct in the high right parietal lobe which appears unchanged. Diffuse intracranial vascular calcifications are noted. The superior orbital veins appear prominent, especially on the right. No other or orbital abnormalities are identified. The cavernous sinus regions appear stable.  The visualized paranasal sinuses, mastoid air cells and middle ears are clear. The calvarium is intact.  IMPRESSION: 1. No acute intracranial findings. 2. Stable atrophy and chronic small vessel ischemic changes. 3. Prominence of the superior orbital veins of undetermined significance/etiology.   Electronically Signed   By: Camie Patience M.D.   On: 09/13/2014 08:04   Dg Chest Port 1 View  09/27/2014   CLINICAL DATA:  Myocardial infarction  EXAM: PORTABLE CHEST - 1 VIEW  COMPARISON:  01/31/2014  FINDINGS: Low lung volumes. No focal  consolidation. No pleural effusion or pneumothorax.  Cardiomegaly.  IMPRESSION: Low lung volumes.  No evidence of acute cardiopulmonary disease.   Electronically Signed   By: Julian Hy M.D.   On: 10/01/2014 19:41    Cardiac Studies:  Assessment/Plan:  Small non-Q-wave myocardial infarction with atypical presentation Confusion/mild left-sided weakness rule out cardioembolic CVA Hypotensive  shock multifactorial i.e. hypovolemia post dialysis/rule out sepsis Atrial fibrillation with controlled ventricular response CAD history of MI 2 in the past status post PCI to LAD and left circumflex in January 2015 Hypertension Diabetes mellitus Moderate aortic stenosis Peripheral vascular disease End-stage renal disease on hemodialysis Hypercholesteremia Anemia of chronic disease Tobacco abuse Marked leukocytosis   probably secondary to steroids rule out sepsis History of gouty arthritis History of CA of prostate Plan Continue present management Wean off dopamine Schedule for MRI/MRA today Check 2-D echo  Dr. Doylene Canard will follow from a.m. Check labs and cultures   LOS: 1 day    Shauntel Prest N 09/16/2014, 8:58 AM

## 2014-09-16 NOTE — Progress Notes (Signed)
Dr.Harwani notified of unavailable regitine, only have 2 PIV sites at this. MD aware of dosing rates of levophed and dopamine,f V/S and lethargy and inability to doppler pedal pulses

## 2014-09-16 NOTE — Progress Notes (Signed)
Tonight pt's sbp has been in the  70's or below. Temp 100.8. Pt responsive to voice and touch. Md on call made aware.  3 fluid boluses have been given. MD order to start dopamine drip and tx to ICU. New order followed through.

## 2014-09-16 NOTE — Evaluation (Signed)
Clinical/Bedside Swallow Evaluation Patient Details  Name: Brad Mcintosh MRN: [id number] Date of Birth: July 12, 1935  Today's Date: 09/16/2014 Time: 1740-8144 SLP Time Calculation (min) (ACUTE ONLY): 12 min  Past Medical History:  Past Medical History  Diagnosis Date  . Hypertension   . Hyperlipidemia   . Cancer     prostate  . Gout   . Anemia     Iron deficiency  . Thyroid disease   . Malnutrition   . Atrial fibrillation     paroxysmal  . Chronic kidney disease     Pt has HD on TUESDAY, THURSDAY and SATURDAY  at [facility]  . Peripheral vascular disease   . Coronary artery disease   . Shortness of breath   . GERD (gastroesophageal reflux disease)   . Aortic stenosis     moderate AS by 10/2013 echo  . Myocardial infarction 10/2013  . Pneumonia 2015  . History of blood transfusion   . Internal hemorrhoids   . Diverticulosis   . Hyperplastic colon polyp   . Anemia    Past Surgical History:  Past Surgical History  Procedure Laterality Date  . Av fistula placement  06/2006    left forearm  Pt denies  . Amputation      right finger  . Incise and drain abcess      chronic complex infection right long finger  . Insertion of dialysis catheter  05/30/2010    right internal jugular Palindrome catheter  . Aortogram  06/01/11    w/ runoff  . Tee without cardioversion N/A 10/25/2013    Procedure: TRANSESOPHAGEAL ECHOCARDIOGRAM (TEE);  Surgeon: [name], MD;  Location: [place];  Service: Cardiovascular;  Laterality: N/A;  . Insertion of dialysis catheter N/A 10/25/2013    Procedure: INSERTION OF DIALYSIS CATHETER;  Surgeon: Elam Dutch, MD;  Location: [place];  Service: Vascular;  Laterality: N/A;  Attempted both right and left neck, procedure unsucessful.  . Cardiac catheterization    . Toe amputation      bil feet  . Coronary angioplasty  11/09/13    LAD Stented  . Av fistula placement Right 01/31/2014    Procedure: INSERTION OF ARTERIOVENOUS (AV) GORE-TEX GRAFT  ARM- RIGHT FOREARM;  Surgeon: Conrad [place], MD;  Location: [facility];  Service: Vascular;  Laterality: Right;   HPI:  79 y.o. male with history of A fib, hypertension, ESRD on HD presenting with left sided weakness and confusion found to have an elevated troponin (2.33) in the ED. Admitted for NSTEMI.  Transferred to CCU 12/5 because of hypotension.   MRI pending.  Assessment / Plan / Recommendation Clinical Impression  Pt presents with impaired mental status, inhibiting consistent attention to POs approaching mouth, requiring cues to attend, seal lips around cup or straw.  He presented with + s/s of potential aspiration of thin liquids, due more to mental status than mechanical swallowing deficit.  Recommend initiating a dysphagia 1 diet with nectar-thick liquids for now; can likely upgrade as MS improves.  Discussed with RN.   SLP to follow.      Aspiration Risk  Moderate    Diet Recommendation Dysphagia 1 (Puree);Nectar-thick liquid   Liquid Administration via: Cup;Straw Medication Administration: Whole meds with puree Supervision: Full supervision/cueing for compensatory strategies Compensations: Slow rate;Small sips/bites (remove cup/straw from mouth to limit size of sips) Postural Changes and/or Swallow Maneuvers: Seated upright 90 degrees;Upright 30-60 min after meal    Other  Recommendations Oral Care Recommendations: Oral  care BID Other Recommendations: Order thickener from pharmacy   Follow Up Recommendations   (tba)    Frequency and Duration min 2x/week  2 weeks       SLP Swallow Goals     Swallow Study Prior Functional Status  Cognitive/Linguistic Baseline: Information not available    General Date of Onset: 09/19/2014 HPI: 79 y.o. male with history of A fib, hypertension, ESRD on HD presenting with left sided weakness and confusion found to have an elevated troponin (2.33) in the ED. Admitted for NSTEMI.  Transferred to CCU 12/5 because of hypotension.   Type of Study:  Bedside swallow evaluation Previous Swallow Assessment: none per records Temperature Spikes Noted: No Respiratory Status: Room air History of Recent Intubation: No Behavior/Cognition: Alert;Confused;Distractible;Requires cueing Oral Cavity - Dentition: Missing dentition Self-Feeding Abilities: Total assist (pain in both UE/ hands when lightly touched) Patient Positioning: Upright in bed Baseline Vocal Quality: Clear Volitional Cough: Cognitively unable to elicit Volitional Swallow: Unable to elicit    Oral/Motor/Sensory Function Overall Oral Motor/Sensory Function: Appears within functional limits for tasks assessed (no cranial nerve deficits id'd; pt with asymmetric mustache that creates appearance of asymmetry )   Ice Chips Ice chips: Not tested   Thin Liquid Thin Liquid: Impaired Presentation: Cup;Straw Oral Phase Impairments: Poor awareness of bolus (intermittent awareness of cup/straw at lips ) Pharyngeal  Phase Impairments: Suspected delayed Swallow;Cough - Immediate    Nectar Thick Nectar Thick Liquid: Within functional limits Presentation: Cup;Straw   Honey Thick Honey Thick Liquid: Not tested   Puree Puree: Impaired Presentation: Spoon Oral Phase Impairments: Poor awareness of bolus (intermittent bolus awareness)   Solid   Desmin Daleo L. [place], [place] CCC/SLP Pager [phone]     Solid: Not tested (due to poor awareness)       Juan Quam Laurice 09/16/2014,4:24 PM

## 2014-09-16 NOTE — Progress Notes (Signed)
  Echocardiogram 2D Echocardiogram has been performed.  Johny Chess 09/16/2014, 2:55 PM

## 2014-09-16 NOTE — Progress Notes (Signed)
Pharmacy note - Regitine availability Regitine has been on backorder and we have none in the hospital currently, nor does [facility] have any.  I informed Johny Shears) Mancel Bale RN of the situation.   Heide Guile, PharmD

## 2014-09-16 NOTE — Progress Notes (Signed)
MD paged about pt's hypotension. Orders received for levophed gtt. Will continue to monitor and update as needed.

## 2014-09-17 ENCOUNTER — Inpatient Hospital Stay (HOSPITAL_COMMUNITY): Payer: Medicare Other

## 2014-09-17 ENCOUNTER — Ambulatory Visit: Payer: Medicare Other | Admitting: Physician Assistant

## 2014-09-17 DIAGNOSIS — R7881 Bacteremia: Secondary | ICD-10-CM

## 2014-09-17 DIAGNOSIS — R0681 Apnea, not elsewhere classified: Secondary | ICD-10-CM

## 2014-09-17 DIAGNOSIS — I639 Cerebral infarction, unspecified: Secondary | ICD-10-CM | POA: Insufficient documentation

## 2014-09-17 DIAGNOSIS — I6309 Cerebral infarction due to thrombosis of other precerebral artery: Secondary | ICD-10-CM

## 2014-09-17 DIAGNOSIS — B9561 Methicillin susceptible Staphylococcus aureus infection as the cause of diseases classified elsewhere: Secondary | ICD-10-CM

## 2014-09-17 DIAGNOSIS — R410 Disorientation, unspecified: Secondary | ICD-10-CM | POA: Insufficient documentation

## 2014-09-17 LAB — BASIC METABOLIC PANEL
ANION GAP: 18 — AB (ref 5–15)
BUN: 37 mg/dL — ABNORMAL HIGH (ref 6–23)
CO2: 25 mEq/L (ref 19–32)
Calcium: 8.5 mg/dL (ref 8.4–10.5)
Chloride: 95 mEq/L — ABNORMAL LOW (ref 96–112)
Creatinine, Ser: 3.9 mg/dL — ABNORMAL HIGH (ref 0.50–1.35)
GFR, EST AFRICAN AMERICAN: 16 mL/min — AB (ref >90)
GFR, EST NON AFRICAN AMERICAN: 14 mL/min — AB (ref >90)
Glucose, Bld: 211 mg/dL — ABNORMAL HIGH (ref 70–99)
POTASSIUM: 4 meq/L (ref 3.7–5.3)
SODIUM: 138 meq/L (ref 137–147)

## 2014-09-17 LAB — GLUCOSE, CAPILLARY
Glucose-Capillary: 205 mg/dL — ABNORMAL HIGH (ref 70–99)
Glucose-Capillary: 188 mg/dL — ABNORMAL HIGH (ref 70–99)

## 2014-09-17 LAB — CBC
HCT: 37.1 % — ABNORMAL LOW (ref 39.0–52.0)
Hemoglobin: 12.1 g/dL — ABNORMAL LOW (ref 13.0–17.0)
MCH: 28.7 pg (ref 26.0–34.0)
MCHC: 32.6 g/dL (ref 30.0–36.0)
MCV: 88.1 fL (ref 78.0–100.0)
Platelets: 121 10*3/uL — ABNORMAL LOW (ref 150–400)
RBC: 4.21 MIL/uL — ABNORMAL LOW (ref 4.22–5.81)
RDW: 20 % — AB (ref 11.5–15.5)
WBC: 19 10*3/uL — AB (ref 4.0–10.5)

## 2014-09-17 LAB — COMPREHENSIVE METABOLIC PANEL
ALK PHOS: 60 U/L (ref 39–117)
ALT: 24 U/L (ref 0–53)
AST: 30 U/L (ref 0–37)
Albumin: 2.2 g/dL — ABNORMAL LOW (ref 3.5–5.2)
Anion gap: 22 — ABNORMAL HIGH (ref 5–15)
BUN: 31 mg/dL — ABNORMAL HIGH (ref 6–23)
CO2: 20 meq/L (ref 19–32)
Calcium: 8.4 mg/dL (ref 8.4–10.5)
Chloride: 94 mEq/L — ABNORMAL LOW (ref 96–112)
Creatinine, Ser: 3.52 mg/dL — ABNORMAL HIGH (ref 0.50–1.35)
GFR, EST AFRICAN AMERICAN: 18 mL/min — AB (ref >90)
GFR, EST NON AFRICAN AMERICAN: 15 mL/min — AB (ref >90)
GLUCOSE: 202 mg/dL — AB (ref 70–99)
POTASSIUM: 3.8 meq/L (ref 3.7–5.3)
Sodium: 136 mEq/L — ABNORMAL LOW (ref 137–147)
Total Bilirubin: 2.3 mg/dL — ABNORMAL HIGH (ref 0.3–1.2)
Total Protein: 5.8 g/dL — ABNORMAL LOW (ref 6.0–8.3)

## 2014-09-17 LAB — PROTIME-INR
INR: 1.84 — ABNORMAL HIGH (ref 0.00–1.49)
PROTHROMBIN TIME: 21.4 s — AB (ref 11.6–15.2)

## 2014-09-17 LAB — TROPONIN I: TROPONIN I: 3.1 ng/mL — AB (ref <0.30)

## 2014-09-17 LAB — MAGNESIUM: Magnesium: 1.9 mg/dL (ref 1.5–2.5)

## 2014-09-17 LAB — APTT: aPTT: >200 seconds (ref 24–37)

## 2014-09-17 LAB — PHOSPHORUS: Phosphorus: 5.2 mg/dL — ABNORMAL HIGH (ref 2.3–4.6)

## 2014-09-17 LAB — HEPARIN LEVEL (UNFRACTIONATED)

## 2014-09-17 MED ORDER — SODIUM CHLORIDE 0.9 % IV BOLUS (SEPSIS)
250.0000 mL | Freq: Once | INTRAVENOUS | Status: AC
  Administered 2014-09-17: 250 mL via INTRAVENOUS

## 2014-09-17 MED ORDER — INFLUENZA VAC SPLIT QUAD 0.5 ML IM SUSY
0.5000 mL | PREFILLED_SYRINGE | INTRAMUSCULAR | Status: AC
  Administered 2014-09-18: 0.5 mL via INTRAMUSCULAR
  Filled 2014-09-17: qty 0.5

## 2014-09-17 MED ORDER — NEPRO/CARBSTEADY PO LIQD
237.0000 mL | Freq: Two times a day (BID) | ORAL | Status: DC
  Administered 2014-09-17 – 2014-09-23 (×5): 237 mL via ORAL
  Filled 2014-09-17 (×17): qty 237

## 2014-09-17 MED ORDER — SODIUM CHLORIDE 0.9 % IV SOLN: INTRAVENOUS | Status: DC | PRN

## 2014-09-17 MED ORDER — PNEUMOCOCCAL VAC POLYVALENT 25 MCG/0.5ML IJ INJ
0.5000 mL | INJECTION | INTRAMUSCULAR | Status: AC
  Administered 2014-09-18: 0.5 mL via INTRAMUSCULAR
  Filled 2014-09-17: qty 0.5

## 2014-09-17 MED ORDER — CEFAZOLIN SODIUM-DEXTROSE 2-3 GM-% IV SOLR
2.0000 g | Freq: Once | INTRAVENOUS | Status: AC
  Administered 2014-09-17: 2 g via INTRAVENOUS
  Filled 2014-09-17: qty 50

## 2014-09-17 MED ORDER — HEPARIN (PORCINE) IN NACL 100-0.45 UNIT/ML-% IJ SOLN
1500.0000 [IU]/h | INTRAMUSCULAR | Status: DC
  Administered 2014-09-18: 1250 [IU]/h via INTRAVENOUS
  Filled 2014-09-17 (×3): qty 250

## 2014-09-17 MED ORDER — CEFAZOLIN SODIUM-DEXTROSE 2-3 GM-% IV SOLR
2.0000 g | INTRAVENOUS | Status: DC
  Administered 2014-09-18: 2 g via INTRAVENOUS
  Filled 2014-09-17 (×2): qty 50

## 2014-09-17 NOTE — Progress Notes (Signed)
Assessment/Plan 1. CVA- Neurology seeing, for MRI if BP stabilizes 2. Nstemi / Hx CAD w occluded RCA and stents to LAD/LCx- per Card 3. ESRD - HD TTS  4. HTN/vol, now hypotension - on clon/ MTP at home. Was below dry wt of 85kg, but not now, CXR negative, no extra vol on exam. BP's low here, worse after HD yest despite no fluid off.  5. Anemia - HGb12.5 hold esa continue weekly fe q thurs hd 6. Metabolic bone disease -hectorol 85mcg q hd & binder when eating 7. A. Fib -per Dr. Terrence Dupont   Plan- For dialysis in AM Tues with no fluid removal planned  Subjective: Interval History: none; got volume yesterday  Objective: Vital signs in last 24 hours: Temp:  [97.6 F (36.4 C)-98.8 F (37.1 C)] 98.4 F (36.9 C) (12/07 0800) Pulse Rate:  [32-113] 68 (12/07 1000) Resp:  [0-34] 20 (12/07 1000) BP: (55-145)/(34-81) 90/50 mmHg (12/07 1000) SpO2:  [71 %-100 %] 100 % (12/07 1000) Weight:  [85.9 kg (189 lb 6 oz)] 85.9 kg (189 lb 6 oz) (12/07 0230) Weight change: 3.255 kg (7 lb 2.8 oz)  Intake/Output from previous day: 12/06 0701 - 12/07 0700 In: 1642 [P.O.:20; I.V.:672; IV Piggyback:950] Out: -  Intake/Output this shift: Total I/O In: 313 [P.O.:240; I.V.:73] Out: -   General appearance: alert and cooperative Resp: clear to auscultation bilaterally GI: soft, non-tender; bowel sounds normal; no masses,  no organomegaly Extremities: tender bilat UEs left>right; decreased movement of LUE Neurologic: Mental status: confused, confabulating Appears to be neglecting left side and not seeing on left  Lab Results:  Recent Labs  09/16/14 2205 09/17/14 0250  WBC 17.4* 19.0*  HGB 12.2* 12.1*  HCT 36.9* 37.1*  PLT 110* 121*   BMET:  Recent Labs  09/17/14 0030 09/17/14 0250  NA 136* 138  K 3.8 4.0  CL 94* 95*  CO2 20 25  GLUCOSE 202* 211*  BUN 31* 37*  CREATININE 3.52* 3.90*  CALCIUM 8.4 8.5   No results for input(s): PTH in the last 72 hours. Iron Studies: No results  for input(s): IRON, TIBC, TRANSFERRIN, FERRITIN in the last 72 hours. Studies/Results: Dg Chest Port 1 View  09/19/2014   CLINICAL DATA:  Myocardial infarction  EXAM: PORTABLE CHEST - 1 VIEW  COMPARISON:  01/31/2014  FINDINGS: Low lung volumes. No focal consolidation. No pleural effusion or pneumothorax.  Cardiomegaly.  IMPRESSION: Low lung volumes.  No evidence of acute cardiopulmonary disease.   Electronically Signed   By: Julian Hy M.D.   On: 09/21/2014 19:41   Scheduled: . aspirin EC  81 mg Oral Daily  . atorvastatin  40 mg Oral q1800  . calcium acetate  667 mg Oral TID WC  . insulin aspart  0-9 Units Subcutaneous TID WC  . metoprolol tartrate  12.5 mg Oral BID  . multivitamin  1 tablet Oral QHS  . pantoprazole  40 mg Oral Daily  . piperacillin-tazobactam (ZOSYN)  IV  2.25 g Intravenous 3 times per day  . ticagrelor  90 mg Oral BID  . [START ON 09/18/2014] vancomycin  750 mg Intravenous Q T,Th,Sa-HD     LOS: 2 days   Lena Gores C 09/17/2014,10:46 AM

## 2014-09-17 NOTE — Procedures (Signed)
Arterial Catheter Insertion Procedure Note Brad Mcintosh [id number] 1935/05/03  Procedure: Insertion of Arterial Catheter  Indications: Blood pressure monitoring and Frequent blood sampling  Procedure Details Consent: Risks of procedure as well as the alternatives and risks of each were explained to the (patient/caregiver).  Consent for procedure obtained. Time Out: Verified patient identification, verified procedure, site/side was marked, verified correct patient position, special equipment/implants available, medications/allergies/relevent history reviewed, required imaging and test results available.  Performed  Maximum sterile technique was used including antiseptics, cap, gloves, gown, hand hygiene, mask and sheet. Skin prep: Chlorhexidine; local anesthetic administered 20 gauge catheter was inserted into left radial artery using the Seldinger technique.  Evaluation Blood flow good; BP tracing good. Complications: No apparent complications.   Revonda Standard 09/17/2014

## 2014-09-17 NOTE — Progress Notes (Addendum)
ANTICOAGULATION CONSULT NOTE - Follow Up Consult  Pharmacy Consult for heparin  Indication: nstemi  Allergies  Allergen Reactions  . Altace [Ramipril] Other (See Comments) and Cough    Also renal failure    Patient Measurements: Height: 6' (182.9 cm) Weight: 182 lb 12.2 oz (82.9 kg) IBW/kg (Calculated) : 77.6 Heparin Dosing Weight:   Vital Signs: Temp: 98.5 F (36.9 C) (12/07 0000) Temp Source: Axillary (12/07 0000) BP: 109/64 mmHg (12/07 0130) Pulse Rate: 107 (12/07 0130)  Labs:  Recent Labs  10/04/2014 0756 09/30/2014 2103 10/06/2014 2257 09/16/14 0527 09/16/14 2205 09/17/14 0030  HGB 12.5*  --   --  12.3* 12.2*  --   HCT 38.9*  --   --  37.8* 36.9*  --   PLT 141*  --   --  118* 110*  --   APTT 35  --   --   --   --   --   LABPROT 21.6*  --   --  20.6* 30.5*  --   INR 1.86*  --   --  1.75* 2.89*  --   HEPARINUNFRC  --   --  <0.10*  --  >2.20*  --   CREATININE 7.16*  --   --  4.89* 3.26* 3.52*  TROPONINI  --  2.16*  --  2.30* 2.37*  2.59*  --     Estimated Creatinine Clearance: 19.3 mL/min (by C-G formula based on Cr of 3.52).   Medications:  Prescriptions prior to admission  Medication Sig Dispense Refill Last Dose  . aspirin EC 81 MG EC tablet Take 1 tablet (81 mg total) by mouth daily.   02/18/2014 at Unknown time  . calcium acetate (PHOSLO) 667 MG capsule Take 667 mg by mouth 3 (three) times daily with meals.    02/17/2014 at Unknown time  . cyclobenzaprine (FLEXERIL) 10 MG tablet Take 10 mg by mouth 3 (three) times daily as needed for muscle spasms.   02/17/2014 at Unknown time  . gabapentin (NEURONTIN) 100 MG capsule Take 100 mg by mouth 2 (two) times daily.    Past Week at Unknown time  . metoprolol succinate (TOPROL-XL) 100 MG 24 hr tablet Take 100 mg by mouth daily.    02/17/2014 at 0830  . omeprazole (PRILOSEC) 40 MG capsule Take 40 mg by mouth daily.  0   . citalopram (CELEXA) 20 MG tablet Take 20 mg by mouth at bedtime. Depression   02/17/2014 at Unknown time   . cloNIDine (CATAPRES) 0.3 MG tablet Take 1 tablet (0.3 mg total) by mouth at bedtime.   02/17/2014 at Unknown time  . colchicine 0.6 MG tablet Take 0.6 mg by mouth daily as needed (gout).    unknown   . HYDROcodone-acetaminophen (NORCO/VICODIN) 5-325 MG per tablet Take 1 tablet by mouth every 6 (six) hours as needed for moderate pain.    Past Week at Unknown time  . hydrOXYzine (ATARAX/VISTARIL) 25 MG tablet Take 1 tablet (25 mg total) by mouth every 6 (six) hours as needed for itching. 15 tablet 0   . isosorbide mononitrate (IMDUR) 60 MG 24 hr tablet Take 60 mg by mouth daily.    02/17/2014 at Unknown time  . multivitamin (RENA-VIT) TABS tablet Take 1 tablet by mouth daily.   02/17/2014 at Unknown time  . ondansetron (ZOFRAN-ODT) 4 MG disintegrating tablet Take 4 mg by mouth every 8 (eight) hours as needed for nausea or vomiting.   Past Week at Unknown time  . pantoprazole (PROTONIX)  40 MG tablet Take 40 mg by mouth daily.   02/17/2014 at Unknown time  . predniSONE (STERAPRED UNI-PAK) 10 MG tablet Take by mouth daily. Day 1: take 6 tabs.  Day 2: 5 tabs  Day 3: 4 tabs  Day 4: 3 tabs  Day 5: 2 tabs  Day 6: 1 tab 21 tablet 0   . simvastatin (ZOCOR) 10 MG tablet Take 10 mg by mouth at bedtime.   02/17/2014 at Unknown time  . Ticagrelor (BRILINTA) 90 MG TABS tablet Take 1 tablet (90 mg total) by mouth 2 (two) times daily. 60 tablet 12 02/17/2014 at Unknown time    Assessment: Heparin infusing at 1250 units/hr (15units/kg/hr) inr this am elevated-not on coumadin. Most likely due to shocked liver/sepsis/DIC. Heparin level drawn from line where heparin is infusing so it is falsely elevated. Pt has limited IV access and is an extremely difficult stick.  Goal of Therapy:  Heparin level 0.3-0.7 units/ml Monitor platelets by anticoagulation protocol: Yes   Plan:  Decrease heparin to 1000 units/hr until accurate level can be obtained or central access is established.  Is there a critical care consult pending?  Also  checking aPTT with am labs for a better picture of  actual clotting time.  Curlene Dolphin 09/17/2014,2:28 AM

## 2014-09-17 NOTE — CV Procedure (Signed)
Central Venous Catheter Insertion Procedure Note ARELI JOWETT [id number] 14-Dec-1934  Procedure: Insertion of Central Venous Catheter Indications: Drug and/or fluid administration  Procedure Details Consent: Risks of procedure as well as the alternatives and risks of each were explained to the (patient/caregiver).  Consent for procedure obtained. Time Out: Verified patient identification, verified procedure, site/side was marked, verified correct patient position, special equipment/implants available, medications/allergies/relevent history reviewed, required imaging and test results available.  Performed  Maximum sterile technique was used including antiseptics, cap, gloves, gown, hand hygiene, mask and sheet. Skin prep: Chlorhexidine; local anesthetic administered A antimicrobial bonded/coated triple lumen catheter was placed in the right subclavian vein using the Seldinger technique.  Evaluation Blood flow good Complications: No apparent complications Patient did tolerate procedure well. Chest X-ray ordered to verify placement.  CXR: normal.  Katina Remick S 09/17/2014, 1:25 PM

## 2014-09-17 NOTE — Progress Notes (Signed)
ANTIBIOTIC CONSULT NOTE - FOLLOW UP  Pharmacy Consult for ancef Indication: bacteremia  Allergies  Allergen Reactions  . Altace [Ramipril] Other (See Comments) and Cough    Also renal failure    Patient Measurements: Height: 6' (182.9 cm) Weight: 189 lb 6 oz (85.9 kg) IBW/kg (Calculated) : 77.6  Vital Signs: Temp: 98.2 F (36.8 C) (12/07 1100) Temp Source: Axillary (12/07 1100) BP: 98/79 mmHg (12/07 1100) Pulse Rate: 68 (12/07 1000) Intake/Output from previous day: 12/06 0701 - 12/07 0700 In: 1642 [P.O.:20; I.V.:672; IV Piggyback:950] Out: -  Intake/Output from this shift: Total I/O In: 313 [P.O.:240; I.V.:73] Out: -   Labs:  Recent Labs  09/16/14 0527 09/16/14 2205 09/17/14 0030 09/17/14 0250  WBC 17.0* 17.4*  --  19.0*  HGB 12.3* 12.2*  --  12.1*  PLT 118* 110*  --  121*  CREATININE 4.89* 3.26* 3.52* 3.90*   Estimated Creatinine Clearance: 17.4 mL/min (by C-G formula based on Cr of 3.9). No results for input(s): VANCOTROUGH, VANCOPEAK, VANCORANDOM, GENTTROUGH, GENTPEAK, GENTRANDOM, TOBRATROUGH, TOBRAPEAK, TOBRARND, AMIKACINPEAK, AMIKACINTROU, AMIKACIN in the last 72 hours.   Microbiology: Recent Results (from the past 720 hour(s))  MRSA PCR Screening     Status: None   Collection Time: 09/26/2014  6:35 PM  Result Value Ref Range Status   MRSA by PCR NEGATIVE NEGATIVE Final    Comment:        The GeneXpert MRSA Assay (FDA approved for NASAL specimens only), is one component of a comprehensive MRSA colonization surveillance program. It is not intended to diagnose MRSA infection nor to guide or monitor treatment for MRSA infections.   Culture, blood (routine x 2)     Status: None (Preliminary result)   Collection Time: 09/27/2014  9:03 PM  Result Value Ref Range Status   Specimen Description BLOOD LEFT FOREARM  Final   Special Requests   Final    BOTTLES DRAWN AEROBIC AND ANAEROBIC 10CC AEROBIC [place] ANAEROBIC   Culture  Setup Time   Final    09/16/2014  03:54 Performed at [facility]    Culture   Final    STAPHYLOCOCCUS AUREUS Note: RIFAMPIN AND GENTAMICIN SHOULD NOT BE USED AS SINGLE DRUGS FOR TREATMENT OF STAPH INFECTIONS. Note: Gram Stain Report Called to,Read Back By and Verified With: NICKY BEASLEY 09/16/14 @ 1:39PM BY RUSCOE A. Performed at [facility]    Report Status PENDING  Incomplete  Culture, blood (routine x 2)     Status: None (Preliminary result)   Collection Time: 09/18/2014  9:03 PM  Result Value Ref Range Status   Specimen Description BLOOD LEFT FOREARM  Final   Special Requests BOTTLES DRAWN AEROBIC AND ANAEROBIC [facility] EACH  Final   Culture  Setup Time   Final    09/16/2014 03:54 Performed at [facility]    Culture   Final    STAPHYLOCOCCUS AUREUS Note: Gram Stain Report Called to,Read Back By and Verified With: NICKY BEASLEY 09/16/14 @ 1:40PM BY RUSCOE A. Performed at [facility]    Report Status PENDING  Incomplete    Anti-infectives    Start     Dose/Rate Route Frequency Ordered Stop   09/18/14 1200  vancomycin (VANCOCIN) IVPB 750 mg/150 ml premix  Status:  Discontinued     750 mg150 mL/hr over 60 Minutes Intravenous Every T-Th-Sa (Hemodialysis) 10/08/2014 2056 09/17/14 1142   09/18/14 1200  ceFAZolin (ANCEF) IVPB 2 g/50 mL premix     2 g100 mL/hr over  30 Minutes Intravenous Every T-Th-Sa (Hemodialysis) 09/17/14 1203     09/17/14 1300  ceFAZolin (ANCEF) IVPB 2 g/50 mL premix     2 g100 mL/hr over 30 Minutes Intravenous  Once 09/17/14 1203     09/20/2014 2200  piperacillin-tazobactam (ZOSYN) IVPB 2.25 g  Status:  Discontinued     2.25 g100 mL/hr over 30 Minutes Intravenous 3 times per day 10/03/2014 2053 09/17/14 1142   09/22/2014 2100  vancomycin (VANCOCIN) 1,500 mg in sodium chloride 0.9 % 500 mL IVPB     1,500 mg250 mL/hr over 120 Minutes Intravenous  Once 09/16/2014 2053 09/16/14 0001      Assessment: 79 yo male with staph aureus bacteremia on zosyn/vanomcyin and to change to  ancef. Patient noted with ESRD with HD on TTS.  12/5 Vanco>> 12/7 12/5 Zosyn>>12/7 12/7 ancef  12/5 blood x2- staph aureus  Plan:  -Ancef 2gm IV now then qHD -Will follow HD schedule, cultures and clinical progress  Hildred Laser, Pharm D 09/17/2014 12:08 PM

## 2014-09-17 NOTE — Consult Note (Addendum)
[facility] for Infectious Disease  Date of Admission:  09/18/2014  Date of Consult:  09/17/2014  Reason for Consult: Staph bacteremia Referring Physician: CHAMP  Impression/Recommendation Staph bacteremia CVA CAD Non-Q MI Afib DM As ESRD  Would: Check tee when he is stable, TTE pending Repeat BCx Stop zosyn Change vanco to ancef He will have vanco on board due to his HD status.  Await MRI of head  Comment- His course seems to fit IE with embolic phenomena. Await further studies.   Thank you so much for this interesting consult,   Bobby Rumpf (pager) [phone] [email or web address]  HENSLEY TREAT is an 79 y.o. male.  HPI: 79 yo M with hx CAD (PTCA), ESRD, brought to ED on 12-5 with L sided weakness, slurred speech. He developed weakness on way to HD and was brought to ED. He was found to have troponin+, WBC 18.8. He was hypotensive and required dopamine support. His CT head was (-) for acute change, his MRI is pending due to changes in BP.  On 12-6 he developed temp to 100.8 and was started on vanoc/zosyn. He has since defervesced, wbc down to 17.4.   Past Medical History  Diagnosis Date  . Hypertension   . Hyperlipidemia   . Cancer     prostate  . Gout   . Anemia     Iron deficiency  . Thyroid disease   . Malnutrition   . Atrial fibrillation     paroxysmal  . Chronic kidney disease     Pt has HD on TUESDAY, THURSDAY and SATURDAY  at [facility]  . Peripheral vascular disease   . Coronary artery disease   . Shortness of breath   . GERD (gastroesophageal reflux disease)   . Aortic stenosis     moderate AS by 10/2013 echo  . Myocardial infarction 10/2013  . Pneumonia 2015  . History of blood transfusion   . Internal hemorrhoids   . Diverticulosis   . Hyperplastic colon polyp   . Anemia     Past Surgical History  Procedure Laterality Date  . Av fistula placement  06/2006    left forearm  Pt denies  . Amputation      right finger  .  Incise and drain abcess      chronic complex infection right long finger  . Insertion of dialysis catheter  05/30/2010    right internal jugular Palindrome catheter  . Aortogram  06/01/11    w/ runoff  . Tee without cardioversion N/A 10/25/2013    Procedure: TRANSESOPHAGEAL ECHOCARDIOGRAM (TEE);  Surgeon: [name], MD;  Location: [facility];  Service: Cardiovascular;  Laterality: N/A;  . Insertion of dialysis catheter N/A 10/25/2013    Procedure: INSERTION OF DIALYSIS CATHETER;  Surgeon: Elam Dutch, MD;  Location: [facility];  Service: Vascular;  Laterality: N/A;  Attempted both right and left neck, procedure unsucessful.  . Cardiac catheterization    . Toe amputation      bil feet  . Coronary angioplasty  11/09/13    LAD Stented  . Av fistula placement Right 01/31/2014    Procedure: INSERTION OF ARTERIOVENOUS (AV) GORE-TEX GRAFT ARM- RIGHT FOREARM;  Surgeon: Conrad Milan, MD;  Location: [facility];  Service: Vascular;  Laterality: Right;     Allergies  Allergen Reactions  . Altace [Ramipril] Other (See Comments) and Cough    Also renal failure    Medications:  Scheduled: . aspirin EC  81 mg Oral  Daily  . atorvastatin  40 mg Oral q1800  . calcium acetate  667 mg Oral TID WC  . insulin aspart  0-9 Units Subcutaneous TID WC  . metoprolol tartrate  12.5 mg Oral BID  . multivitamin  1 tablet Oral QHS  . pantoprazole  40 mg Oral Daily  . piperacillin-tazobactam (ZOSYN)  IV  2.25 g Intravenous 3 times per day  . ticagrelor  90 mg Oral BID  . [START ON 09/18/2014] vancomycin  750 mg Intravenous Q T,Th,Sa-HD    Abtx:  Anti-infectives    Start     Dose/Rate Route Frequency Ordered Stop   09/18/14 1200  vancomycin (VANCOCIN) IVPB 750 mg/150 ml premix     750 mg150 mL/hr over 60 Minutes Intravenous Every T-Th-Sa (Hemodialysis) 09/11/2014 2056     09/20/2014 2200  piperacillin-tazobactam (ZOSYN) IVPB 2.25 g     2.25 g100 mL/hr over 30 Minutes Intravenous 3 times per day 09/11/2014 2053      10/08/2014 2100  vancomycin (VANCOCIN) 1,500 mg in sodium chloride 0.9 % 500 mL IVPB     1,500 mg250 mL/hr over 120 Minutes Intravenous  Once 10/09/2014 2053 09/16/14 0001      Total days of antibiotics: 3 vanco/zosyn          Social History:  reports that he quit smoking about 42 years ago. His smoking use included Cigarettes. He smoked 0.50 packs per day. He has never used smokeless tobacco. He reports that he does not drink alcohol or use illicit drugs.  Family History  Problem Relation Age of Onset  . Hypertension Mother   . Asthma Father   . Cancer Sister     breast  . Kidney disease Brother   . Diabetes Brother     General ROS: states he has normal Bm, no dysphagia, diffuse pain, (note pt fails orientation questions)  Blood pressure 98/79, pulse 68, temperature 98.4 F (36.9 C), temperature source Axillary, resp. rate 21, height 6' (1.829 m), weight 85.9 kg (189 lb 6 oz), SpO2 100 %. General appearance: alert and moderate distress Eyes: bilateral arcus senilus, EOMI Throat: dry, pt does not open all the way Lungs: clear to auscultation bilaterally Heart: irregularly irregular rhythm Abdomen: normal findings: bowel sounds normal and soft, non-tender Extremities: edema none and RUE fsitula with +bruit. LUE with nonfunctioning fistula. he has BLE gret toe amputations. he has no foot lesions. he haas faint pulses.   MSE- person only. He is repeatedly crying out in pain.    Results for orders placed or performed during the hospital encounter of 10/08/2014 (from the past 48 hour(s))  MRSA PCR Screening     Status: None   Collection Time: 09/30/2014  6:35 PM  Result Value Ref Range   MRSA by PCR NEGATIVE NEGATIVE    Comment:        The GeneXpert MRSA Assay (FDA approved for NASAL specimens only), is one component of a comprehensive MRSA colonization surveillance program. It is not intended to diagnose MRSA infection nor to guide or monitor treatment for MRSA infections.     Glucose, capillary     Status: None   Collection Time: 09/14/2014  6:46 PM  Result Value Ref Range   Glucose-Capillary 78 70 - 99 mg/dL  Glucose, capillary     Status: Abnormal   Collection Time: 09/24/2014  8:29 PM  Result Value Ref Range   Glucose-Capillary 69 (L) 70 - 99 mg/dL  Glucose, capillary     Status: Abnormal  Collection Time: 09/23/2014  8:47 PM  Result Value Ref Range   Glucose-Capillary 109 (H) 70 - 99 mg/dL  Troponin I (q 6hr x 3)     Status: Abnormal   Collection Time: 10/04/2014  9:03 PM  Result Value Ref Range   Troponin I 2.16 (HH) <0.30 ng/mL    Comment:        Due to the release kinetics of cTnI, a negative result within the first hours of the onset of symptoms does not rule out myocardial infarction with certainty. If myocardial infarction is still suspected, repeat the test at appropriate intervals. CRITICAL VALUE NOTED.  VALUE IS CONSISTENT WITH PREVIOUSLY REPORTED AND CALLED VALUE.   Culture, blood (routine x 2)     Status: None (Preliminary result)   Collection Time: 10/02/2014  9:03 PM  Result Value Ref Range   Specimen Description BLOOD LEFT FOREARM    Special Requests      BOTTLES DRAWN AEROBIC AND ANAEROBIC 10CC AEROBIC [place] ANAEROBIC   Culture  Setup Time      09/16/2014 03:54 Performed at [facility] Note: RIFAMPIN AND GENTAMICIN SHOULD NOT BE USED AS SINGLE DRUGS FOR TREATMENT OF STAPH INFECTIONS. Note: Gram Stain Report Called to,Read Back By and Verified With: NICKY BEASLEY 09/16/14 @ 1:39PM BY RUSCOE A. Performed at [facility]    Report Status PENDING   Culture, blood (routine x 2)     Status: None (Preliminary result)   Collection Time: 10/11/2014  9:03 PM  Result Value Ref Range   Specimen Description BLOOD LEFT FOREARM    Special Requests BOTTLES DRAWN AEROBIC AND ANAEROBIC 5CC EACH    Culture  Setup Time      09/16/2014 03:54 Performed at [facility] Note: Gram Stain Report Called to,Read Back By and Verified With: NICKY BEASLEY 09/16/14 @ 1:40PM BY RUSCOE A. Performed at [facility]    Report Status PENDING   Lactic acid, plasma     Status: Abnormal   Collection Time: 10/10/2014  9:03 PM  Result Value Ref Range   Lactic Acid, Venous 3.8 (H) 0.5 - 2.2 mmol/L  Glucose, capillary     Status: Abnormal   Collection Time: 09/14/2014  9:16 PM  Result Value Ref Range   Glucose-Capillary 129 (H) 70 - 99 mg/dL  Heparin level (unfractionated)     Status: Abnormal   Collection Time: 09/24/2014 10:57 PM  Result Value Ref Range   Heparin Unfractionated <0.10 (L) 0.30 - 0.70 IU/mL    Comment:        IF HEPARIN RESULTS ARE BELOW EXPECTED VALUES, AND PATIENT DOSAGE HAS BEEN CONFIRMED, SUGGEST FOLLOW UP TESTING OF ANTITHROMBIN III LEVELS. REPEATED TO VERIFY   TSH     Status: None   Collection Time: 10/05/2014 10:57 PM  Result Value Ref Range   TSH 1.100 0.350 - 4.500 uIU/mL  Magnesium     Status: None   Collection Time: 09/21/2014 10:57 PM  Result Value Ref Range   Magnesium 1.7 1.5 - 2.5 mg/dL  Hemoglobin A1c     Status: None   Collection Time: 09/24/2014 10:57 PM  Result Value Ref Range   Hgb A1c MFr Bld 5.4 <5.7 %    Comment: (NOTE)  According to the ADA Clinical Practice Recommendations for 2011, when HbA1c is used as a screening test:  >=6.5%   Diagnostic of Diabetes Mellitus           (if abnormal result is confirmed) 5.7-6.4%   Increased risk of developing Diabetes Mellitus References:Diagnosis and Classification of Diabetes Mellitus,Diabetes DXAJ,2878,67(EHMCN 1):S62-S69 and Standards of Medical Care in         Diabetes - 2011,Diabetes OBSJ,6283,66 (Suppl 1):S11-S61.    Mean Plasma Glucose 108 <117 mg/dL    Comment: Performed at [facility]  Glucose, capillary     Status: None   Collection Time: 09/16/14 12:41 AM  Result  Value Ref Range   Glucose-Capillary 91 70 - 99 mg/dL   Comment 1 Capillary Sample   CBC     Status: Abnormal   Collection Time: 09/16/14  5:27 AM  Result Value Ref Range   WBC 17.0 (H) 4.0 - 10.5 K/uL   RBC 4.16 (L) 4.22 - 5.81 MIL/uL   Hemoglobin 12.3 (L) 13.0 - 17.0 g/dL   HCT 37.8 (L) 39.0 - 52.0 %   MCV 90.9 78.0 - 100.0 fL   MCH 29.6 26.0 - 34.0 pg   MCHC 32.5 30.0 - 36.0 g/dL   RDW 20.2 (H) 11.5 - 15.5 %   Platelets 118 (L) 150 - 400 K/uL    Comment: PLATELET COUNT CONFIRMED BY SMEAR REPEATED TO VERIFY SPECIMEN CHECKED FOR CLOTS   Basic metabolic panel     Status: Abnormal   Collection Time: 09/16/14  5:27 AM  Result Value Ref Range   Sodium 142 137 - 147 mEq/L   Potassium 4.2 3.7 - 5.3 mEq/L   Chloride 99 96 - 112 mEq/L   CO2 23 19 - 32 mEq/L   Glucose, Bld 117 (H) 70 - 99 mg/dL   BUN 24 (H) 6 - 23 mg/dL   Creatinine, Ser 4.89 (H) 0.50 - 1.35 mg/dL   Calcium 8.8 8.4 - 10.5 mg/dL   GFR calc non Af Amer 10 (L) >90 mL/min   GFR calc Af Amer 12 (L) >90 mL/min    Comment: (NOTE) The eGFR has been calculated using the CKD EPI equation. This calculation has not been validated in all clinical situations. eGFR's persistently <90 mL/min signify possible Chronic Kidney Disease.    Anion gap 20 (H) 5 - 15  Hemoglobin A1c     Status: None   Collection Time: 09/16/14  5:27 AM  Result Value Ref Range   Hgb A1c MFr Bld 5.4 <5.7 %    Comment: (NOTE)                                                                       According to the ADA Clinical Practice Recommendations for 2011, when HbA1c is used as a screening test:  >=6.5%   Diagnostic of Diabetes Mellitus           (if abnormal result is confirmed) 5.7-6.4%   Increased risk of developing Diabetes Mellitus References:Diagnosis and Classification of Diabetes Mellitus,Diabetes QHUT,6546,50(PTWSF 1):S62-S69 and Standards of Medical Care in         Diabetes - 2011,Diabetes Care,2011,34 (Suppl 1):S11-S61.    Mean Plasma  Glucose 108 <117 mg/dL  Comment: Performed at [facility]  Lipid panel     Status: Abnormal   Collection Time: 09/16/14  5:27 AM  Result Value Ref Range   Cholesterol 79 0 - 200 mg/dL   Triglycerides 59 <150 mg/dL   HDL 21 (L) >39 mg/dL   Total CHOL/HDL Ratio 3.8 RATIO   VLDL 12 0 - 40 mg/dL   LDL Cholesterol 46 0 - 99 mg/dL    Comment:        Total Cholesterol/HDL:CHD Risk Coronary Heart Disease Risk Table                     Men   Women  1/2 Average Risk   3.4   3.3  Average Risk       5.0   4.4  2 X Average Risk   9.6   7.1  3 X Average Risk  23.4   11.0        Use the calculated Patient Ratio above and the CHD Risk Table to determine the patient's CHD Risk.        ATP III CLASSIFICATION (LDL):  <100     mg/dL   Optimal  100-129  mg/dL   Near or Above                    Optimal  130-159  mg/dL   Borderline  160-189  mg/dL   High  >190     mg/dL   Very High   Troponin I (q 6hr x 3)     Status: Abnormal   Collection Time: 09/16/14  5:27 AM  Result Value Ref Range   Troponin I 2.30 (HH) <0.30 ng/mL    Comment:        Due to the release kinetics of cTnI, a negative result within the first hours of the onset of symptoms does not rule out myocardial infarction with certainty. If myocardial infarction is still suspected, repeat the test at appropriate intervals. CRITICAL VALUE NOTED.  VALUE IS CONSISTENT WITH PREVIOUSLY REPORTED AND CALLED VALUE.   Protime-INR     Status: Abnormal   Collection Time: 09/16/14  5:27 AM  Result Value Ref Range   Prothrombin Time 20.6 (H) 11.6 - 15.2 seconds   INR 1.75 (H) 0.00 - 1.49  Glucose, capillary     Status: Abnormal   Collection Time: 09/16/14  8:05 AM  Result Value Ref Range   Glucose-Capillary 123 (H) 70 - 99 mg/dL   Comment 1 Capillary Sample   Glucose, capillary     Status: Abnormal   Collection Time: 09/16/14 11:12 AM  Result Value Ref Range   Glucose-Capillary 117 (H) 70 - 99 mg/dL   Comment 1 Capillary  Sample   Glucose, capillary     Status: Abnormal   Collection Time: 09/16/14  5:32 PM  Result Value Ref Range   Glucose-Capillary 145 (H) 70 - 99 mg/dL   Comment 1 Capillary Sample   Troponin I (q 6hr x 3)     Status: Abnormal   Collection Time: 09/16/14 10:05 PM  Result Value Ref Range   Troponin I 2.59 (HH) <0.30 ng/mL    Comment:        Due to the release kinetics of cTnI, a negative result within the first hours of the onset of symptoms does not rule out myocardial infarction with certainty. If myocardial infarction is still suspected, repeat the test at appropriate intervals. CRITICAL VALUE NOTED.  VALUE IS  CONSISTENT WITH PREVIOUSLY REPORTED AND CALLED VALUE.   Heparin level (unfractionated)     Status: Abnormal   Collection Time: 09/16/14 10:05 PM  Result Value Ref Range   Heparin Unfractionated >2.20 (H) 0.30 - 0.70 IU/mL    Comment: RESULTS CONFIRMED BY MANUAL DILUTION        IF HEPARIN RESULTS ARE BELOW EXPECTED VALUES, AND PATIENT DOSAGE HAS BEEN CONFIRMED, SUGGEST FOLLOW UP TESTING OF ANTITHROMBIN III LEVELS.   Basic metabolic panel     Status: Abnormal   Collection Time: 09/16/14 10:05 PM  Result Value Ref Range   Sodium 135 (L) 137 - 147 mEq/L    Comment: DELTA CHECK NOTED   Potassium 3.8 3.7 - 5.3 mEq/L   Chloride 94 (L) 96 - 112 mEq/L   CO2 22 19 - 32 mEq/L   Glucose, Bld 201 (H) 70 - 99 mg/dL   BUN 32 (H) 6 - 23 mg/dL   Creatinine, Ser 3.26 (H) 0.50 - 1.35 mg/dL   Calcium 8.4 8.4 - 10.5 mg/dL   GFR calc non Af Amer 17 (L) >90 mL/min   GFR calc Af Amer 20 (L) >90 mL/min    Comment: (NOTE) The eGFR has been calculated using the CKD EPI equation. This calculation has not been validated in all clinical situations. eGFR's persistently <90 mL/min signify possible Chronic Kidney Disease.    Anion gap 19 (H) 5 - 15  CBC with Differential     Status: Abnormal   Collection Time: 09/16/14 10:05 PM  Result Value Ref Range   WBC 17.4 (H) 4.0 - 10.5 K/uL     Comment: WHITE COUNT CONFIRMED ON SMEAR   RBC 4.17 (L) 4.22 - 5.81 MIL/uL   Hemoglobin 12.2 (L) 13.0 - 17.0 g/dL   HCT 36.9 (L) 39.0 - 52.0 %   MCV 88.5 78.0 - 100.0 fL   MCH 29.3 26.0 - 34.0 pg   MCHC 33.1 30.0 - 36.0 g/dL   RDW 20.2 (H) 11.5 - 15.5 %   Platelets 110 (L) 150 - 400 K/uL    Comment: PLATELET COUNT CONFIRMED BY SMEAR SPECIMEN CHECKED FOR CLOTS REPEATED TO VERIFY    Neutrophils Relative % 89 (H) 43 - 77 %   Lymphocytes Relative 2 (L) 12 - 46 %   Monocytes Relative 9 3 - 12 %   Eosinophils Relative 0 0 - 5 %   Basophils Relative 0 0 - 1 %   Neutro Abs 15.5 (H) 1.7 - 7.7 K/uL   Lymphs Abs 0.3 (L) 0.7 - 4.0 K/uL   Monocytes Absolute 1.6 (H) 0.1 - 1.0 K/uL   Eosinophils Absolute 0.0 0.0 - 0.7 K/uL   Basophils Absolute 0.0 0.0 - 0.1 K/uL   RBC Morphology TARGET CELLS     Comment: TEARDROP CELLS ELLIPTOCYTES BURR CELLS ACANTHOCYTES    Smear Review LARGE PLATELETS PRESENT     Comment: PLATELETS APPEAR DECREASED  Troponin I     Status: Abnormal   Collection Time: 09/16/14 10:05 PM  Result Value Ref Range   Troponin I 2.37 (HH) <0.30 ng/mL    Comment:        Due to the release kinetics of cTnI, a negative result within the first hours of the onset of symptoms does not rule out myocardial infarction with certainty. If myocardial infarction is still suspected, repeat the test at appropriate intervals. CRITICAL VALUE NOTED.  VALUE IS CONSISTENT WITH PREVIOUSLY REPORTED AND CALLED VALUE.   Protime-INR     Status: Abnormal  Collection Time: 09/16/14 10:05 PM  Result Value Ref Range   Prothrombin Time 30.5 (H) 11.6 - 15.2 seconds   INR 2.89 (H) 0.00 - 1.49  Magnesium     Status: None   Collection Time: 09/17/14 12:30 AM  Result Value Ref Range   Magnesium 1.9 1.5 - 2.5 mg/dL  Comprehensive metabolic panel     Status: Abnormal   Collection Time: 09/17/14 12:30 AM  Result Value Ref Range   Sodium 136 (L) 137 - 147 mEq/L   Potassium 3.8 3.7 - 5.3 mEq/L    Chloride 94 (L) 96 - 112 mEq/L   CO2 20 19 - 32 mEq/L   Glucose, Bld 202 (H) 70 - 99 mg/dL   BUN 31 (H) 6 - 23 mg/dL   Creatinine, Ser 3.52 (H) 0.50 - 1.35 mg/dL   Calcium 8.4 8.4 - 10.5 mg/dL   Total Protein 5.8 (L) 6.0 - 8.3 g/dL   Albumin 2.2 (L) 3.5 - 5.2 g/dL   AST 30 0 - 37 U/L   ALT 24 0 - 53 U/L   Alkaline Phosphatase 60 39 - 117 U/L   Total Bilirubin 2.3 (H) 0.3 - 1.2 mg/dL   GFR calc non Af Amer 15 (L) >90 mL/min   GFR calc Af Amer 18 (L) >90 mL/min    Comment: (NOTE) The eGFR has been calculated using the CKD EPI equation. This calculation has not been validated in all clinical situations. eGFR's persistently <90 mL/min signify possible Chronic Kidney Disease.    Anion gap 22 (H) 5 - 15  Phosphorus     Status: Abnormal   Collection Time: 09/17/14 12:30 AM  Result Value Ref Range   Phosphorus 5.2 (H) 2.3 - 4.6 mg/dL  CBC     Status: Abnormal   Collection Time: 09/17/14  2:50 AM  Result Value Ref Range   WBC 19.0 (H) 4.0 - 10.5 K/uL   RBC 4.21 (L) 4.22 - 5.81 MIL/uL   Hemoglobin 12.1 (L) 13.0 - 17.0 g/dL   HCT 37.1 (L) 39.0 - 52.0 %   MCV 88.1 78.0 - 100.0 fL   MCH 28.7 26.0 - 34.0 pg   MCHC 32.6 30.0 - 36.0 g/dL   RDW 20.0 (H) 11.5 - 15.5 %   Platelets 121 (L) 150 - 400 K/uL    Comment: REPEATED TO VERIFY PLATELET COUNT CONFIRMED BY SMEAR LARGE PLATELETS PRESENT   Basic metabolic panel     Status: Abnormal   Collection Time: 09/17/14  2:50 AM  Result Value Ref Range   Sodium 138 137 - 147 mEq/L   Potassium 4.0 3.7 - 5.3 mEq/L   Chloride 95 (L) 96 - 112 mEq/L   CO2 25 19 - 32 mEq/L   Glucose, Bld 211 (H) 70 - 99 mg/dL   BUN 37 (H) 6 - 23 mg/dL   Creatinine, Ser 3.90 (H) 0.50 - 1.35 mg/dL   Calcium 8.5 8.4 - 10.5 mg/dL   GFR calc non Af Amer 14 (L) >90 mL/min   GFR calc Af Amer 16 (L) >90 mL/min    Comment: (NOTE) The eGFR has been calculated using the CKD EPI equation. This calculation has not been validated in all clinical situations. eGFR's  persistently <90 mL/min signify possible Chronic Kidney Disease.    Anion gap 18 (H) 5 - 15  Troponin I     Status: Abnormal   Collection Time: 09/17/14  2:50 AM  Result Value Ref Range   Troponin I 3.10 (HH) <  0.30 ng/mL    Comment:        Due to the release kinetics of cTnI, a negative result within the first hours of the onset of symptoms does not rule out myocardial infarction with certainty. If myocardial infarction is still suspected, repeat the test at appropriate intervals. CRITICAL VALUE NOTED.  VALUE IS CONSISTENT WITH PREVIOUSLY REPORTED AND CALLED VALUE.   Protime-INR     Status: Abnormal   Collection Time: 09/17/14  2:50 AM  Result Value Ref Range   Prothrombin Time 21.4 (H) 11.6 - 15.2 seconds   INR 1.84 (H) 0.00 - 1.49  APTT     Status: Abnormal   Collection Time: 09/17/14  2:50 AM  Result Value Ref Range   aPTT >200 (HH) 24 - 37 seconds    Comment:        IF BASELINE aPTT IS ELEVATED, SUGGEST PATIENT RISK ASSESSMENT BE USED TO DETERMINE APPROPRIATE ANTICOAGULANT THERAPY. CRITICAL RESULT CALLED TO, READ BACK BY AND VERIFIED WITH: ROBERTS,J RN 09/17/2014 0415 [facility] SPECIMEN CHECKED FOR CLOTS REPEATED TO VERIFY       Component Value Date/Time   SDES BLOOD LEFT FOREARM 10/01/2014 2103   SDES BLOOD LEFT FOREARM 10/04/2014 2103   SPECREQUEST  09/20/2014 2103    BOTTLES DRAWN AEROBIC AND ANAEROBIC 10CC AEROBIC [place] ANAEROBIC   SPECREQUEST BOTTLES DRAWN AEROBIC AND ANAEROBIC 5CC EACH 09/24/2014 2103   CULT  09/18/2014 2103    STAPHYLOCOCCUS AUREUS Note: RIFAMPIN AND GENTAMICIN SHOULD NOT BE USED AS SINGLE DRUGS FOR TREATMENT OF STAPH INFECTIONS. Note: Gram Stain Report Called to,Read Back By and Verified With: NICKY BEASLEY 09/16/14 @ 1:39PM BY RUSCOE A. Performed at [facility]  09/23/2014 2103    STAPHYLOCOCCUS AUREUS Note: Gram Stain Report Called to,Read Back By and Verified With: NICKY BEASLEY 09/16/14 @ 1:40PM BY RUSCOE A. Performed at  [facility] PENDING 10/05/2014 2103   REPTSTATUS PENDING 09/11/2014 2103   Dg Chest Port 1 View  09/23/2014   CLINICAL DATA:  Myocardial infarction  EXAM: PORTABLE CHEST - 1 VIEW  COMPARISON:  01/31/2014  FINDINGS: Low lung volumes. No focal consolidation. No pleural effusion or pneumothorax.  Cardiomegaly.  IMPRESSION: Low lung volumes.  No evidence of acute cardiopulmonary disease.   Electronically Signed   By: Julian Hy M.D.   On: 09/24/2014 19:41   Recent Results (from the past 240 hour(s))  MRSA PCR Screening     Status: None   Collection Time: 09/22/2014  6:35 PM  Result Value Ref Range Status   MRSA by PCR NEGATIVE NEGATIVE Final    Comment:        The GeneXpert MRSA Assay (FDA approved for NASAL specimens only), is one component of a comprehensive MRSA colonization surveillance program. It is not intended to diagnose MRSA infection nor to guide or monitor treatment for MRSA infections.   Culture, blood (routine x 2)     Status: None (Preliminary result)   Collection Time: 09/12/2014  9:03 PM  Result Value Ref Range Status   Specimen Description BLOOD LEFT FOREARM  Final   Special Requests   Final    BOTTLES DRAWN AEROBIC AND ANAEROBIC 10CC AEROBIC [place] ANAEROBIC   Culture  Setup Time   Final    09/16/2014 03:54 Performed at [facility]    Culture   Final    STAPHYLOCOCCUS AUREUS Note: RIFAMPIN AND GENTAMICIN SHOULD NOT BE USED AS SINGLE DRUGS FOR TREATMENT  OF STAPH INFECTIONS. Note: Gram Stain Report Called to,Read Back By and Verified With: NICKY BEASLEY 09/16/14 @ 1:39PM BY RUSCOE A. Performed at [facility]    Report Status PENDING  Incomplete  Culture, blood (routine x 2)     Status: None (Preliminary result)   Collection Time: 10/03/2014  9:03 PM  Result Value Ref Range Status   Specimen Description BLOOD LEFT FOREARM  Final   Special Requests BOTTLES DRAWN AEROBIC AND ANAEROBIC [facility] EACH  Final   Culture  Setup Time    Final    09/16/2014 03:54 Performed at [facility]    Culture   Final    STAPHYLOCOCCUS AUREUS Note: Gram Stain Report Called to,Read Back By and Verified With: NICKY BEASLEY 09/16/14 @ 1:40PM BY RUSCOE A. Performed at [facility]    Report Status PENDING  Incomplete      09/17/2014, 11:15 AM     LOS: 2 days        [facility] Antimicrobial Management Team Staphylococcus aureus bacteremia   Staphylococcus aureus bacteremia (SAB) is associated with a high rate of complications and mortality.  Specific aspects of clinical management are critical to optimizing the outcome of patients with SAB.  Therefore, the [facility] Antimicrobial Management Team [facility]) has initiated an intervention aimed at improving the management of SAB at [facility].  To do so, Infectious Diseases physicians are providing an evidence-based consult for the management of all patients with SAB.     Yes No Comments  Perform follow-up blood cultures (even if the patient is afebrile) to ensure clearance of bacteremia [x] []   Remove vascular catheter and obtain follow-up blood cultures after the removal of the catheter [] []   Perform echocardiography to evaluate for endocarditis (transthoracic ECHO is 40-50% sensitive, TEE is > 90% sensitive) [x] [] Please keep in mind, that neither test can definitively EXCLUDE endocarditis, and that should clinical suspicion remain high for endocarditis the patient should then still be treated with an "endocarditis" duration of therapy = 6 weeks  Consult electrophysiologist to evaluate implanted cardiac device (pacemaker, ICD) [] []   Ensure source control [] [] Have all abscesses been drained effectively? Have deep seeded infections (septic joints or osteomyelitis) had appropriate surgical debridement?  Investigate for "metastatic" sites of infection [x] [] Does the patient have ANY symptom or physical exam finding that would suggest a deeper infection (back or  neck pain that may be suggestive of vertebral osteomyelitis or epidural abscess, muscle pain that could be a symptom of pyomyositis)?  Keep in mind that for deep seeded infections MRI imaging with contrast is preferred rather than other often insensitive tests such as plain x-rays, especially early in a patient's presentation.  Change antibiotic therapy to __________________ [] [] Beta-lactam antibiotics are preferred for MSSA due to higher cure rates.   If on Vancomycin, goal trough should be 15 - 20 mcg/mL  Estimated duration of IV antibiotic therapy:   [] [] Consult case management for probably prolonged outpatient IV antibiotic therapy

## 2014-09-17 NOTE — Plan of Care (Signed)
Problem: Consults Goal: Skin Care Protocol Initiated - if Braden Score 18 or less If consults are not indicated, leave blank or document N/A Outcome: Completed/Met Date Met:  09/17/14     

## 2014-09-17 NOTE — Progress Notes (Signed)
OT Cancellation Note  Patient Details Name: Brad Mcintosh MRN: [id number] DOB: 1935-05-01   Cancelled Treatment:    Reason Eval/Treat Not Completed: Patient at procedure or test/ unavailable (Pt with other discipline).  Will reattempt.   Darlina Rumpf [place], OTR/L [phone]  09/17/2014, 4:03 PM

## 2014-09-17 NOTE — Progress Notes (Addendum)
INITIAL NUTRITION ASSESSMENT  DOCUMENTATION CODES Per approved criteria  -Not Applicable   INTERVENTION: Provide Nepro Shake po BID thickened to nectar thick consistency, each supplement provides 425 kcal and 19 grams protein.  Encourage PO intake.   NUTRITION DIAGNOSIS: Increased nutrient needs related to chronic illness as evidenced by estimated nutrition needs.   Goal: Pt to meet >/= 90% of their estimated nutrition needs   Monitor:  PO intake, weight trends, labs, I/O's  Reason for Assessment: Low Braden Score  79 y.o. male  Admitting Dx: Acute coronary syndrome  ASSESSMENT: Pt with PMH for coronary artery disease history of silent inferior wall myocardial infarction subsequently had PTCA stenting to LAD and left circumflex noted to have chronically occluded RCA, HTN, DM, ESRD on HD, history of paroxysmal A. Fib, heart disease, hypercholesterolemia, peripheral vascular disease, valvular heart disease, anemia of chronic disease, history of tobacco abuse, gouty arthritis, history of CVA of prostate, came to the ER by EMS as patient was noted to be confused and had left-sided weakness and left facial droop earlier this morning.  Pt was in a procedure during attempted time of visit. Pt is currently on a dysphagia 1 diet with nectar thick liquids. Meal completion is 50%. RD unable to obtain nutrition information or perform a nutrition focused physical exam. Per Epic weight records, weight has been stable. RD to order supplements to help maximize calorie and protein consumption as po intake as been inadequate.  Labs: Low chloride and GFR. High BUN and creatinine.  Height: Ht Readings from Last 1 Encounters:  09/14/2014 6' (1.829 m)    Weight: Wt Readings from Last 1 Encounters:  09/17/14 189 lb 6 oz (85.9 kg)    Ideal Body Weight: 178 lbs  % Ideal Body Weight: 106%  Wt Readings from Last 10 Encounters:  09/17/14 189 lb 6 oz (85.9 kg)  02/16/14 189 lb (85.73 kg)   01/31/14 85 lb 5 oz (38.697 kg)  11/20/13 188 lb 9.6 oz (85.548 kg)  11/10/13 186 lb 4.6 oz (84.5 kg)  10/26/13 194 lb 14.2 oz (88.4 kg)  08/16/13 193 lb (87.544 kg)  06/02/13 183 lb (83.008 kg)  12/02/12 194 lb (87.998 kg)  08/10/12 204 lb 9.6 oz (92.806 kg)    Usual Body Weight: unable to obtain  % Usual Body Weight: ---  BMI:  Body mass index is 25.68 kg/(m^2).  Estimated Nutritional Needs: Kcal: 2000-2200 Protein: 105-115 grams Fluid: Per MD  Skin: non-pitting LE edema  Diet Order: DIET - DYS 1 with nectar thick liquids  EDUCATION NEEDS: -No education needs identified at this time   Intake/Output Summary (Last 24 hours) at 09/17/14 1039 Last data filed at 09/17/14 1000  Gross per 24 hour  Intake 1577.15 ml  Output      0 ml  Net 1577.15 ml    Last BM: 12/6  Labs:   Recent Labs Lab 09/27/2014 2257  09/16/14 2205 09/17/14 0030 09/17/14 0250  NA  --   < > 135* 136* 138  K  --   < > 3.8 3.8 4.0  CL  --   < > 94* 94* 95*  CO2  --   < > 22 20 25   BUN  --   < > 32* 31* 37*  CREATININE  --   < > 3.26* 3.52* 3.90*  CALCIUM  --   < > 8.4 8.4 8.5  MG 1.7  --   --  1.9  --   PHOS  --   --   --  5.2*  --   GLUCOSE  --   < > 201* 202* 211*  < > = values in this interval not displayed.  CBG (last 3)   Recent Labs  09/16/14 0805 09/16/14 1112 09/16/14 1732  GLUCAP 123* 117* 145*    Scheduled Meds: . aspirin EC  81 mg Oral Daily  . atorvastatin  40 mg Oral q1800  . calcium acetate  667 mg Oral TID WC  . insulin aspart  0-9 Units Subcutaneous TID WC  . metoprolol tartrate  12.5 mg Oral BID  . multivitamin  1 tablet Oral QHS  . pantoprazole  40 mg Oral Daily  . piperacillin-tazobactam (ZOSYN)  IV  2.25 g Intravenous 3 times per day  . ticagrelor  90 mg Oral BID  . [START ON 09/18/2014] vancomycin  750 mg Intravenous Q T,Th,Sa-HD    Continuous Infusions: . sodium chloride 10 mL/hr at 09/16/14 1807  . DOPamine Stopped (09/17/14 0740)  . heparin  1,000 Units/hr (09/17/14 0816)  . norepinephrine (LEVOPHED) Adult infusion 7 mcg/min (09/17/14 1000)    Past Medical History  Diagnosis Date  . Hypertension   . Hyperlipidemia   . Cancer     prostate  . Gout   . Anemia     Iron deficiency  . Thyroid disease   . Malnutrition   . Atrial fibrillation     paroxysmal  . Chronic kidney disease     Pt has HD on TUESDAY, THURSDAY and SATURDAY  at [facility]  . Peripheral vascular disease   . Coronary artery disease   . Shortness of breath   . GERD (gastroesophageal reflux disease)   . Aortic stenosis     moderate AS by 10/2013 echo  . Myocardial infarction 10/2013  . Pneumonia 2015  . History of blood transfusion   . Internal hemorrhoids   . Diverticulosis   . Hyperplastic colon polyp   . Anemia     Past Surgical History  Procedure Laterality Date  . Av fistula placement  06/2006    left forearm  Pt denies  . Amputation      right finger  . Incise and drain abcess      chronic complex infection right long finger  . Insertion of dialysis catheter  05/30/2010    right internal jugular Palindrome catheter  . Aortogram  06/01/11    w/ runoff  . Tee without cardioversion N/A 10/25/2013    Procedure: TRANSESOPHAGEAL ECHOCARDIOGRAM (TEE);  Surgeon: [name], MD;  Location: [place];  Service: Cardiovascular;  Laterality: N/A;  . Insertion of dialysis catheter N/A 10/25/2013    Procedure: INSERTION OF DIALYSIS CATHETER;  Surgeon: Elam Dutch, MD;  Location: [place];  Service: Vascular;  Laterality: N/A;  Attempted both right and left neck, procedure unsucessful.  . Cardiac catheterization    . Toe amputation      bil feet  . Coronary angioplasty  11/09/13    LAD Stented  . Av fistula placement Right 01/31/2014    Procedure: INSERTION OF ARTERIOVENOUS (AV) GORE-TEX GRAFT ARM- RIGHT FOREARM;  Surgeon: Conrad [place], MD;  Location: [place];  Service: Vascular;  Laterality: Right;    Kallie Locks, MS, RD, LDN Pager #  [phone] After hours/ weekend pager # [phone]

## 2014-09-17 NOTE — Progress Notes (Signed)
Ref: [name], MD   Subjective:  Responds to voice. Low blood pressures, improving with fluid bolus.   Objective:  Vital Signs in the last 24 hours: Temp:  [97.6 F (36.4 C)-98.5 F (36.9 C)] 98.2 F (36.8 C) (12/07 1100) Pulse Rate:  [32-113] 66 (12/07 1500) Cardiac Rhythm:  [-] Atrial fibrillation (12/07 0800) Resp:  [0-34] 20 (12/07 1700) BP: (63-145)/(34-81) 93/50 mmHg (12/07 1330) SpO2:  [71 %-100 %] 98 % (12/07 1700) Arterial Line BP: (89-107)/(15-89) 89/15 mmHg (12/07 1700) Weight:  [85.9 kg (189 lb 6 oz)] 85.9 kg (189 lb 6 oz) (12/07 0230)  Physical Exam: BP Readings from Last 1 Encounters:  09/17/14 93/50    Wt Readings from Last 1 Encounters:  09/17/14 85.9 kg (189 lb 6 oz)    Weight change: 3.255 kg (7 lb 2.8 oz)  HEENT: [place]/AT, Eyes-Brown, PERL, EOMI, Conjunctiva-Pink, Sclera-Non-icteric Neck: No JVD, No bruit, Trachea midline. Lungs:  Clear, Bilateral. Cardiac:  Regular rhythm, normal S1 and S2, no S3. III/VI systolic murmur. Abdomen:  Soft, non-tender. Extremities:  No edema present. No cyanosis. No clubbing. Bilateral great toes amputation. Left arm AVF. Right subclavian central line. CNS: AxOx1, Cranial nerves grossly intact, moves all 4 extremities.  Skin: Warm and dry.   Intake/Output from previous day: 12/06 0701 - 12/07 0700 In: 1642 [P.O.:20; I.V.:672; IV Piggyback:950] Out: -     Lab Results: BMET    Component Value Date/Time   NA 138 09/17/2014 0250   NA 136* 09/17/2014 0030   NA 135* 09/16/2014 2205   K 4.0 09/17/2014 0250   K 3.8 09/17/2014 0030   K 3.8 09/16/2014 2205   CL 95* 09/17/2014 0250   CL 94* 09/17/2014 0030   CL 94* 09/16/2014 2205   CO2 25 09/17/2014 0250   CO2 20 09/17/2014 0030   CO2 22 09/16/2014 2205   GLUCOSE 211* 09/17/2014 0250   GLUCOSE 202* 09/17/2014 0030   GLUCOSE 201* 09/16/2014 2205   BUN 37* 09/17/2014 0250   BUN 31* 09/17/2014 0030   BUN 32* 09/16/2014 2205   CREATININE 3.90* 09/17/2014 0250   CREATININE 3.52* 09/17/2014 0030   CREATININE 3.26* 09/16/2014 2205   CALCIUM 8.5 09/17/2014 0250   CALCIUM 8.4 09/17/2014 0030   CALCIUM 8.4 09/16/2014 2205   CALCIUM 7.8* 12/07/2009 0558   CALCIUM 8.7 10/25/2009 1301   CALCIUM 8.5 05/29/2009 0936   GFRNONAA 14* 09/17/2014 0250   GFRNONAA 15* 09/17/2014 0030   GFRNONAA 17* 09/16/2014 2205   GFRAA 16* 09/17/2014 0250   GFRAA 18* 09/17/2014 0030   GFRAA 20* 09/16/2014 2205   CBC    Component Value Date/Time   WBC 19.0* 09/17/2014 0250   RBC 4.21* 09/17/2014 0250   RBC 4.05* 10/21/2013 2230   HGB 12.1* 09/17/2014 0250   HCT 37.1* 09/17/2014 0250   PLT 121* 09/17/2014 0250   MCV 88.1 09/17/2014 0250   MCH 28.7 09/17/2014 0250   MCHC 32.6 09/17/2014 0250   RDW 20.0* 09/17/2014 0250   LYMPHSABS 0.3* 09/16/2014 2205   MONOABS 1.6* 09/16/2014 2205   EOSABS 0.0 09/16/2014 2205   BASOSABS 0.0 09/16/2014 2205   HEPATIC Function Panel  Recent Labs  10/21/13 1644 10/11/2014 0756 09/17/14 0030  PROT 5.4* 6.9 5.8*   HEMOGLOBIN A1C No components found for: HGA1C,  MPG CARDIAC ENZYMES Lab Results  Component Value Date   CKTOTAL 403* 12/09/2009   CKMB 4.9* 12/09/2009   TROPONINI 3.10* 09/17/2014   TROPONINI 2.59* 09/16/2014   TROPONINI  2.37* 09/16/2014   BNP No results for input(s): PROBNP in the last 8760 hours. TSH  Recent Labs  10/23/13 1135 10/04/2014 2257  TSH 1.730 1.100   CHOLESTEROL  Recent Labs  09/16/14 0527  CHOL 79    Scheduled Meds: . aspirin EC  81 mg Oral Daily  . atorvastatin  40 mg Oral q1800  . calcium acetate  667 mg Oral TID WC  . [START ON 09/18/2014]  ceFAZolin (ANCEF) IV  2 g Intravenous Q T,Th,Sa-HD  . feeding supplement (NEPRO CARB STEADY)  237 mL Oral BID BM  . [START ON 09/18/2014] Influenza vac split quadrivalent PF  0.5 mL Intramuscular Tomorrow-1000  . insulin aspart  0-9 Units Subcutaneous TID WC  . metoprolol tartrate  12.5 mg Oral BID  . multivitamin  1 tablet Oral QHS  .  pantoprazole  40 mg Oral Daily  . [START ON 09/18/2014] pneumococcal 23 valent vaccine  0.5 mL Intramuscular Tomorrow-1000  . sodium chloride  250 mL Intravenous Once  . ticagrelor  90 mg Oral BID   Continuous Infusions: . sodium chloride 10 mL/hr at 09/16/14 1807  . DOPamine Stopped (09/17/14 0740)  . heparin 1,250 Units/hr (09/17/14 1700)  . norepinephrine (LEVOPHED) Adult infusion 7 mcg/min (09/17/14 1720)   PRN Meds:.Place/Maintain arterial line **AND** sodium chloride, acetaminophen, nitroGLYCERIN, ondansetron (ZOFRAN) IV, ondansetron, senna-docusate  Assessment/Plan: Small non-Q-wave myocardial infarction with atypical presentation Confusion/mild left-sided weakness rule out cardioembolic CVA Hypotensive shock multifactorial i.e. hypovolemia post dialysis/rule out sepsis Atrial fibrillation with controlled ventricular response CAD history of MI 2 in the past status post PCI to LAD and left circumflex in January 2015 Hypertension Diabetes mellitus, II Moderate aortic stenosis Peripheral vascular disease End-stage renal disease on hemodialysis Hypercholesteremia Anemia of chronic disease Tobacco abuse Marked leukocytosis probably secondary to steroids rule out sepsis History of gouty arthritis History of CA of prostate   Fluid boluses as needed. Antibiotic change per infectious disease for staph infection. Dialysis per renal doctor. MRI/MRA pending. Follow with neurology.   LOS: 2 days    Dixie Dials  MD  09/17/2014, 6:33 PM

## 2014-09-17 NOTE — Evaluation (Signed)
Physical Therapy Evaluation Patient Details Name: Brad Mcintosh MRN: [id number] DOB: October 08, 1935 Today's Date: 09/17/2014   History of Present Illness  Pt adm with lt sided weakness and slurred speech. CT - and MRI pending. PMH - ESRD on HD, CAD, PVD, HTN, toe amputations  Clinical Impression  Pt admitted with above. Pt currently with functional limitations due to the deficits listed below (see PT Problem List).  Pt will benefit from skilled PT to increase their independence and safety with mobility to allow discharge to the venue listed below.       Follow Up Recommendations SNF    Equipment Recommendations  Other (comment) (to be determined)    Recommendations for Other Services       Precautions / Restrictions Precautions Precautions: Fall      Mobility  Bed Mobility Overal bed mobility: Needs Assistance Bed Mobility: Supine to Sit;Sit to Supine;Rolling Rolling: +2 for physical assistance;Total assist   Supine to sit: +2 for physical assistance;Total assist Sit to supine: +2 for physical assistance;Total assist   General bed mobility comments: Assist with all aspects.  Transfers                    Ambulation/Gait                Stairs            Wheelchair Mobility    Modified Rankin (Stroke Patients Only)       Balance Overall balance assessment: Needs assistance Sitting-balance support: Feet supported Sitting balance-Leahy Scale: Poor Sitting balance - Comments: Sat EOB x 5 minutes with mod to max assist. Postural control: Posterior lean;Left lateral lean                                   Pertinent Vitals/Pain Pain Assessment: Faces Faces Pain Scale: Hurts even more Pain Location: Pt denies pain but was grimacing frequently. Pain Descriptors / Indicators: Grimacing Pain Intervention(s): Limited activity within patient's tolerance;Repositioned    Home Living Family/patient expects to be discharged to:: Private  residence Living Arrangements: Children               Additional Comments: Unable to assess due to pt's mental status    Prior Function           Comments: Unsure of status and pt unable to answer questions appropriately.     Hand Dominance        Extremity/Trunk Assessment   Upper Extremity Assessment: Defer to OT evaluation           Lower Extremity Assessment: Difficult to assess due to impaired cognition;RLE deficits/detail;LLE deficits/detail RLE Deficits / Details: Active movement against gravity LLE Deficits / Details: Able to lift leg up but inconsistent     Communication      Cognition Arousal/Alertness: Awake/alert   Overall Cognitive Status: Impaired/Different from baseline Area of Impairment: Problem solving;Memory;Orientation;Following commands;Attention Orientation Level: Place;Time;Situation     Following Commands: Follows one step commands inconsistently     Problem Solving: Slow processing;Requires verbal cues;Requires tactile cues;Decreased initiation General Comments: lt inattention/neglect    General Comments      Exercises        Assessment/Plan    PT Assessment Patient needs continued PT services  PT Diagnosis Generalized weakness;Altered mental status;Hemiplegia non-dominant side   PT Problem List Decreased strength;Decreased activity tolerance;Decreased balance;Decreased mobility;Decreased cognition;Decreased knowledge of use of DME;Decreased safety awareness;Decreased  knowledge of precautions  PT Treatment Interventions DME instruction;Balance training;Neuromuscular re-education;Functional mobility training;Cognitive remediation;Therapeutic activities;Therapeutic exercise   PT Goals (Current goals can be found in the Care Plan section) Acute Rehab PT Goals Patient Stated Goal: pt unable to state PT Goal Formulation: Patient unable to participate in goal setting Time For Goal Achievement: 10/01/14 Potential to Achieve  Goals: Fair    Frequency Min 3X/week   Barriers to discharge        Co-evaluation               End of Session   Activity Tolerance: Patient limited by fatigue Patient left: in bed;with call bell/phone within reach;with nursing/sitter in room           Time: 1040-1102 PT Time Calculation (min) (ACUTE ONLY): 22 min   Charges:   PT Evaluation $Initial PT Evaluation Tier I: 1 Procedure PT Treatments $Therapeutic Activity: 8-22 mins   PT G Codes:          Markiya Keefe 10-09-2014, 1:00 PM  [employer] PT [phone]

## 2014-09-17 NOTE — Progress Notes (Signed)
ANTICOAGULATION CONSULT NOTE - Follow Up Consult  Pharmacy Consult for heparin  Indication: nstemi  Allergies  Allergen Reactions  . Altace [Ramipril] Other (See Comments) and Cough    Also renal failure    Patient Measurements: Height: 6' (182.9 cm) Weight: 189 lb 6 oz (85.9 kg) IBW/kg (Calculated) : 77.6  Vital Signs: Temp: 98.2 F (36.8 C) (12/07 1100) Temp Source: Axillary (12/07 1100) BP: 93/50 mmHg (12/07 1330) Pulse Rate: 77 (12/07 1415)  Labs:  Recent Labs  09/24/2014 0756  09/25/2014 2257 09/16/14 0527 09/16/14 2205 09/17/14 0030 09/17/14 0250 09/17/14 1000  HGB 12.5*  --   --  12.3* 12.2*  --  12.1*  --   HCT 38.9*  --   --  37.8* 36.9*  --  37.1*  --   PLT 141*  --   --  118* 110*  --  121*  --   APTT 35  --   --   --   --   --  >200*  --   LABPROT 21.6*  --   --  20.6* 30.5*  --  21.4*  --   INR 1.86*  --   --  1.75* 2.89*  --  1.84*  --   HEPARINUNFRC  --   --  <0.10*  --  >2.20*  --   --  <0.10*  CREATININE 7.16*  --   --  4.89* 3.26* 3.52* 3.90*  --   TROPONINI  --   < >  --  2.30* 2.37*  2.59*  --  3.10*  --   < > = values in this interval not displayed.  Estimated Creatinine Clearance: 17.4 mL/min (by C-G formula based on Cr of 3.9).    Assessment: 79 yo male on heparin for NSTEMI and also noted with possible CVA. Heparin level drawn today after central line placed and heparin level is < 0.1. Patient is on 1000 units/hr (~ 12 units/kg/hr). Per Rn, no infusion interruptions have been noted.  Goal of Therapy:  Heparin level = 0.3-0.5 Monitor platelets by anticoagulation protocol: Yes   Plan:  -Increase heparin to 1250 units/hr -Heparin level in 8 hrs  Hildred Laser, Pharm D 09/17/2014 3:43 PM

## 2014-09-17 NOTE — Progress Notes (Signed)
STROKE TEAM PROGRESS NOTE   HISTORY Brad Mcintosh is an 79 y.o. male who is here with the report of weakness left side, associated with confusion, that was noticed prior to his dialysis treatment this morning. He was transferred here by EMS. The patient denies any problems and states he is not sure why he is here. He went to dialysis, from his home this morning by private vehicle, driven by his son. He states that he was able to walk into dialysis. He did not eat breakfast this morning. He denies headache or weakness. He complains of pain in his left upper arm. There's been no known trauma.  Unable to reach family so no further information available. Per review of records he has history of HTN, paroxysmal A fib (on ASA and brilinta), ESRD on HD.   Date last known well: 09/14/2014 Time last known well: 2100 tPA Given: no, outside window  SUBJECTIVE (INTERVAL HISTORY) His RN is at the bedside.  Overall he feels his condition is unchanged. His BP remains labile.   He is on dopamine drip and heparin drip. MRI not able to be done due to the low BP and hemodynamic instability. He has bilateral arm pain, tender to touch. Not able to test muscle strength. He is confused and not able to obtain further history and not cooperative on exam.  OBJECTIVE Temp:  [97.6 F (36.4 C)-98.8 F (37.1 C)] 98.2 F (36.8 C) (12/07 1100) Pulse Rate:  [32-113] 77 (12/07 1415) Cardiac Rhythm:  [-] Atrial fibrillation (12/07 0800) Resp:  [0-34] 31 (12/07 1415) BP: (55-145)/(34-81) 93/50 mmHg (12/07 1330) SpO2:  [71 %-100 %] 97 % (12/07 1415) Arterial Line BP: (105-107)/(43) 107/43 mmHg (12/07 1430) Weight:  [189 lb 6 oz (85.9 kg)] 189 lb 6 oz (85.9 kg) (12/07 0230)   Recent Labs Lab 09/30/2014 2116 09/16/14 0041 09/16/14 0805 09/16/14 1112 09/16/14 1732  GLUCAP 129* 91 123* 117* 145*    Recent Labs Lab 10/08/2014 0756 09/30/2014 2257 09/16/14 0527 09/16/14 2205 09/17/14 0030 09/17/14 0250  NA 140  --   142 135* 136* 138  K 4.9  --  4.2 3.8 3.8 4.0  CL 93*  --  99 94* 94* 95*  CO2 24  --  23 22 20 25   GLUCOSE 103*  --  117* 201* 202* 211*  BUN 35*  --  24* 32* 31* 37*  CREATININE 7.16*  --  4.89* 3.26* 3.52* 3.90*  CALCIUM 10.3  --  8.8 8.4 8.4 8.5  MG  --  1.7  --   --  1.9  --   PHOS  --   --   --   --  5.2*  --     Recent Labs Lab 09/22/2014 0756 09/17/14 0030  AST 27 30  ALT 17 24  ALKPHOS 57 60  BILITOT 2.9* 2.3*  PROT 6.9 5.8*  ALBUMIN 3.1* 2.2*    Recent Labs Lab 09/11/2014 0756 09/16/14 0527 09/16/14 2205 09/17/14 0250  WBC 18.8* 17.0* 17.4* 19.0*  NEUTROABS 17.3*  --  15.5*  --   HGB 12.5* 12.3* 12.2* 12.1*  HCT 38.9* 37.8* 36.9* 37.1*  MCV 89.6 90.9 88.5 88.1  PLT 141* 118* 110* 121*    Recent Labs Lab 09/17/2014 2103 09/16/14 0527 09/16/14 2205 09/17/14 0250  TROPONINI 2.16* 2.30* 2.37*  2.59* 3.10*    Recent Labs  09/16/2014 0756 09/16/14 0527 09/16/14 2205 09/17/14 0250  LABPROT 21.6* 20.6* 30.5* 21.4*  INR 1.86* 1.75* 2.89* 1.84*  No results for input(s): COLORURINE, LABSPEC, [place], GLUCOSEU, HGBUR, BILIRUBINUR, KETONESUR, PROTEINUR, UROBILINOGEN, NITRITE, LEUKOCYTESUR in the last 72 hours.  Invalid input(s): APPERANCEUR     Component Value Date/Time   CHOL 79 09/16/2014 0527   TRIG 59 09/16/2014 0527   HDL 21* 09/16/2014 0527   CHOLHDL 3.8 09/16/2014 0527   VLDL 12 09/16/2014 0527   LDLCALC 46 09/16/2014 0527   Lab Results  Component Value Date   HGBA1C 5.4 09/16/2014   No results found for: LABOPIA, COCAINSCRNUR, LABBENZ, AMPHETMU, THCU, LABBARB   Recent Labs Lab 10/02/2014 0756  ETH <11    Ct Head Wo Contrast  10/03/2014   IMPRESSION: 1. No acute intracranial findings. 2. Stable atrophy and chronic small vessel ischemic changes. 3. Prominence of the superior orbital veins of undetermined significance/etiology.    Dg Chest Port 1 View  09/27/2014  IMPRESSION: Low lung volumes.  No evidence of acute cardiopulmonary  disease.     CUS - There is 1-39% left ICA stenosis. Unable to scan right side secondary to patient's refusal to turn head and confusion.   2D echo - pending report.    Component     Latest Ref Rng 09/18/2014 09/16/2014  Cholesterol     0 - 200 mg/dL  79  Triglycerides     <150 mg/dL  59  HDL     >39 mg/dL  21 (L)  Total CHOL/HDL Ratio       3.8  VLDL     0 - 40 mg/dL  12  LDL (calc)     0 - 99 mg/dL  46  Hgb A1c MFr Bld     <5.7 % 5.4 5.4  Mean Plasma Glucose     <117 mg/dL 108 108  Troponin I     <0.30 ng/mL 2.16 (HH) 2.30 (HH)  TSH     0.350 - 4.500 uIU/mL 1.100     PHYSICAL EXAM  Temp:  [97.6 F (36.4 C)-98.8 F (37.1 C)] 98.2 F (36.8 C) (12/07 1100) Pulse Rate:  [32-113] 77 (12/07 1415) Resp:  [0-34] 31 (12/07 1415) BP: (55-145)/(34-81) 93/50 mmHg (12/07 1330) SpO2:  [71 %-100 %] 97 % (12/07 1415) Arterial Line BP: (105-107)/(43) 107/43 mmHg (12/07 1430) Weight:  [189 lb 6 oz (85.9 kg)] 189 lb 6 oz (85.9 kg) (12/07 0230)  General - cachectic, well developed, on face mask for O2 and disorientated.  Ophthalmologic - not able to perform due to incooperation.  Cardiovascular - in and out of afib rhythm.  Neuro - awake and alert but confused and oriented to self only Nonfluent hesitant speech. Speaks few words and short sentences only.Dysarthric. Follows a couple of simple commands and then refused to follow commands. Blinking to visual threat bilaterally but not moving eyes as requested. Spontaneously moving eyes to both horizontal directions. PERRL. Facial symmetrical, tongue in middle,     Bilateral UE tender to touch, R>L, not able to test muscle strength. BLE withdraw to pain and able to against gravity. Reflex diminished and no babinski.  ASSESSMENT/PLAN [name] is a 79 y.o. male with history of afib not on [facility], CAD and MI s/p stent 10/2013 on ASA and brilinta, HTN, ESRD on HD was admitted due to confusion and slurry speech prior to HD session.  Questionable left sided weakness likely due to pain.    Generalized weakness and painful UEs - likely related to NSTEMI and unclear etiology for BUE tenderness  Pt has bilaterally UE tender to touch,  today R>L, not able to test muscle strength  As per history, he was admitted for generalized weakness, slurry speech  His initially reported left sided weakness seems due to arm pain  Less supicious for stroke etiology  MRI  pending  MRA  pending  Carotid Doppler  40-59% bilateral ICAs.   2D Echo  pending  LDL 46, at the goal  HgbA1c 5.4 at the goal  On heparin drip now also for VTE prophylaxis  DIET - DYS 1   aspirin 81 mg orally every day and and brilinta prior to admission, now on heparin IV  NSTEMI - elevated troponin, but he has ESRD and the current level is much less than in 10/2013.  - cardiology managing - on heparin drip - continue to trend troponin  Intermittent Afib - in and out of afib rhythm - on heparin drip - once stable, progress to coumadin bridge with lovenox or think about NOAC - rate control.  Bilateral UE pain - unclear etiology - R>L - recommend X-ray of bilateral shoulder.  CAD and MI s/p stent - on ASA and brilinta prior to admission - currently on heparin drip  Hypotension  On dopamine drip  Once stable, may consider MRI  Avoid hypotension  ESRD on HD - nephrology on board - management per nephrology  Other Stroke Risk Factors  Advanced age  Hospital day # 2 I have personally examined this patient, reviewed notes, independently viewed imaging studies, participated in medical decision making and plan of care. I have made any additions or clarifications directly to the above note. Agree with note above.    Antony Contras, MD Medical Director [place] Pager: [phone] 09/17/2014 3:20 PM      To contact Stroke Continuity provider, please refer to [email or web address] After hours, contact General Neurology

## 2014-09-18 ENCOUNTER — Inpatient Hospital Stay (HOSPITAL_COMMUNITY): Payer: Medicare Other

## 2014-09-18 DIAGNOSIS — I214 Non-ST elevation (NSTEMI) myocardial infarction: Secondary | ICD-10-CM

## 2014-09-18 DIAGNOSIS — I6300 Cerebral infarction due to thrombosis of unspecified precerebral artery: Secondary | ICD-10-CM

## 2014-09-18 DIAGNOSIS — N186 End stage renal disease: Secondary | ICD-10-CM

## 2014-09-18 DIAGNOSIS — R579 Shock, unspecified: Secondary | ICD-10-CM | POA: Insufficient documentation

## 2014-09-18 DIAGNOSIS — I6340 Cerebral infarction due to embolism of unspecified cerebral artery: Secondary | ICD-10-CM | POA: Diagnosis not present

## 2014-09-18 DIAGNOSIS — I4891 Unspecified atrial fibrillation: Secondary | ICD-10-CM

## 2014-09-18 DIAGNOSIS — I2510 Atherosclerotic heart disease of native coronary artery without angina pectoris: Secondary | ICD-10-CM

## 2014-09-18 DIAGNOSIS — I639 Cerebral infarction, unspecified: Secondary | ICD-10-CM

## 2014-09-18 DIAGNOSIS — E119 Type 2 diabetes mellitus without complications: Secondary | ICD-10-CM

## 2014-09-18 DIAGNOSIS — R197 Diarrhea, unspecified: Secondary | ICD-10-CM

## 2014-09-18 LAB — CBC
HCT: 32.8 % — ABNORMAL LOW (ref 39.0–52.0)
Hemoglobin: 11.1 g/dL — ABNORMAL LOW (ref 13.0–17.0)
MCH: 29.4 pg (ref 26.0–34.0)
MCHC: 33.8 g/dL (ref 30.0–36.0)
MCV: 87 fL (ref 78.0–100.0)
Platelets: 105 10*3/uL — ABNORMAL LOW (ref 150–400)
RBC: 3.77 MIL/uL — ABNORMAL LOW (ref 4.22–5.81)
RDW: 19.9 % — AB (ref 11.5–15.5)
WBC: 19 10*3/uL — ABNORMAL HIGH (ref 4.0–10.5)

## 2014-09-18 LAB — CULTURE, BLOOD (ROUTINE X 2)

## 2014-09-18 LAB — BLOOD GAS, ARTERIAL
Acid-base deficit: 2.1 mmol/L — ABNORMAL HIGH (ref 0.0–2.0)
BICARBONATE: 21.2 meq/L (ref 20.0–24.0)
O2 CONTENT: 3 L/min
O2 Saturation: 98.8 %
PCO2 ART: 30.4 mmHg — AB (ref 35.0–45.0)
PH ART: 7.457 — AB (ref 7.350–7.450)
Patient temperature: 98.6
TCO2: 22.1 mmol/L (ref 0–100)
pO2, Arterial: 147 mmHg — ABNORMAL HIGH (ref 80.0–100.0)

## 2014-09-18 LAB — BASIC METABOLIC PANEL
Anion gap: 20 — ABNORMAL HIGH (ref 5–15)
BUN: 52 mg/dL — AB (ref 6–23)
CALCIUM: 8.6 mg/dL (ref 8.4–10.5)
CO2: 19 mEq/L (ref 19–32)
CREATININE: 6.88 mg/dL — AB (ref 0.50–1.35)
Chloride: 100 mEq/L (ref 96–112)
GFR, EST AFRICAN AMERICAN: 8 mL/min — AB (ref >90)
GFR, EST NON AFRICAN AMERICAN: 7 mL/min — AB (ref >90)
Glucose, Bld: 219 mg/dL — ABNORMAL HIGH (ref 70–99)
POTASSIUM: 3.5 meq/L — AB (ref 3.7–5.3)
Sodium: 139 mEq/L (ref 137–147)

## 2014-09-18 LAB — HEPARIN LEVEL (UNFRACTIONATED): Heparin Unfractionated: <0.1 IU/mL — ABNORMAL LOW (ref 0.30–0.70)

## 2014-09-18 LAB — CLOSTRIDIUM DIFFICILE BY PCR: CDIFFPCR: NEGATIVE

## 2014-09-18 LAB — GLUCOSE, CAPILLARY: GLUCOSE-CAPILLARY: 119 mg/dL — AB (ref 70–99)

## 2014-09-18 MED ORDER — LIDOCAINE-PRILOCAINE 2.5-2.5 % EX CREA: 1.0000 "application " | TOPICAL_CREAM | CUTANEOUS | Status: DC | PRN

## 2014-09-18 MED ORDER — HEPARIN (PORCINE) IN NACL 100-0.45 UNIT/ML-% IJ SOLN
1750.0000 [IU]/h | INTRAMUSCULAR | Status: DC
  Administered 2014-09-18: 1750 [IU]/h via INTRAVENOUS
  Filled 2014-09-18: qty 250

## 2014-09-18 MED ORDER — LOPERAMIDE HCL 2 MG PO CAPS
2.0000 mg | ORAL_CAPSULE | ORAL | Status: DC | PRN
  Administered 2014-09-20: 2 mg via ORAL
  Filled 2014-09-18 (×2): qty 1

## 2014-09-18 MED ORDER — SODIUM CHLORIDE 0.9 % IV SOLN
Freq: Once | INTRAVENOUS | Status: AC
  Administered 2014-09-18: 10:00:00 via INTRAVENOUS

## 2014-09-18 MED ORDER — HEPARIN SODIUM (PORCINE) 1000 UNIT/ML DIALYSIS: 1000.0000 [IU] | INTRAMUSCULAR | Status: DC | PRN

## 2014-09-18 MED ORDER — SODIUM CHLORIDE 0.9 % IJ SOLN
10.0000 mL | INTRAMUSCULAR | Status: DC | PRN
  Administered 2014-09-25: 30 mL
  Filled 2014-09-18: qty 40

## 2014-09-18 MED ORDER — SODIUM CHLORIDE 0.9 % IV SOLN: 100.0000 mL | INTRAVENOUS | Status: DC | PRN

## 2014-09-18 MED ORDER — MIDAZOLAM HCL 2 MG/2ML IJ SOLN: 1.0000 mg | Freq: Once | INTRAMUSCULAR | Status: DC

## 2014-09-18 MED ORDER — SODIUM CHLORIDE 0.9 % IJ SOLN
10.0000 mL | Freq: Two times a day (BID) | INTRAMUSCULAR | Status: DC
  Administered 2014-09-18: 30 mL
  Administered 2014-09-18 – 2014-09-26 (×13): 10 mL

## 2014-09-18 MED ORDER — LOPERAMIDE HCL 2 MG PO CAPS
4.0000 mg | ORAL_CAPSULE | Freq: Once | ORAL | Status: AC
  Administered 2014-09-18: 4 mg via ORAL
  Filled 2014-09-18: qty 2

## 2014-09-18 MED ORDER — MIDAZOLAM HCL 2 MG/2ML IJ SOLN
INTRAMUSCULAR | Status: AC
  Filled 2014-09-18: qty 2

## 2014-09-18 MED ORDER — LIDOCAINE HCL (PF) 1 % IJ SOLN: 5.0000 mL | INTRAMUSCULAR | Status: DC | PRN

## 2014-09-18 MED ORDER — PENTAFLUOROPROP-TETRAFLUOROETH EX AERO: 1.0000 "application " | INHALATION_SPRAY | CUTANEOUS | Status: DC | PRN

## 2014-09-18 MED ORDER — HEPARIN (PORCINE) IN NACL 100-0.45 UNIT/ML-% IJ SOLN
1850.0000 [IU]/h | INTRAMUSCULAR | Status: DC
  Administered 2014-09-18: 1850 [IU]/h via INTRAVENOUS
  Filled 2014-09-18: qty 250

## 2014-09-18 MED ORDER — ALTEPLASE 2 MG IJ SOLR: 2.0000 mg | Freq: Once | INTRAMUSCULAR | Status: AC | PRN

## 2014-09-18 MED ORDER — NEPRO/CARBSTEADY PO LIQD
237.0000 mL | ORAL | Status: DC | PRN
  Administered 2014-09-23: 237 mL via ORAL
  Filled 2014-09-18: qty 237

## 2014-09-18 MED ORDER — HEPARIN SODIUM (PORCINE) 1000 UNIT/ML DIALYSIS: 20.0000 [IU]/kg | INTRAMUSCULAR | Status: DC | PRN

## 2014-09-18 NOTE — Care Management Note (Addendum)
    Page 1 of 1   09/25/2014     9:40:07 AM CARE MANAGEMENT NOTE 09/25/2014  Patient:  Brad Mcintosh, Brad Mcintosh   Account Number:  [id number]  Date Initiated:  09/21/2014  Documentation initiated by:  Laurena Slimmer  Subjective/Objective Assessment:   79 y.o. male who is here with the report of weakness left side, associated with confusion, that was noticed prior to his dialysis treatment this morning     Action/Plan:   Continue Hemodialysis   Anticipated DC Date:     Anticipated DC Plan:  [place]  CM consult  Medication Assistance      Choice offered to / List presented to:             Status of service:  Completed, signed off Medicare Important Message given?  YES (If response is "NO", the following Medicare IM given date fields will be blank) Date Medicare IM given:  09/18/2014 Medicare IM given by:  Brad Mcintosh Date Additional Medicare IM given:  09/25/2014 Additional Medicare IM given by:  [facility] Brad Mcintosh  Discharge Disposition:    Per UR Regulation:    If discussed at Long Length of Stay Meetings, dates discussed:   09/20/2014    Comments:  12/7  1141a Brad Jedi Catalfamo rn,bsn left pt 30day free brilinta card. will cont to moniter for dc planning needs as pt progresses. 45.00 pe rmonth copay w no prior auth req.  09/27/2014 13:39 [name] RN BSN NCM ED CM meet with patient at bedside. Patient presents to [facility] ED for c/o  weakness left side, associated with confusion, patient was sent from dialysis.ED evaluation  Neuro consulted r/o CVA.  Patient receives HD at [place] T/Th/Sat  [phone]. Patient lives at [facility] with daughter Brad Mcintosh [phone].  Unit CM and CSW will continue to follow for disposition plan.

## 2014-09-18 NOTE — Plan of Care (Signed)
Problem: Consults Goal: Diabetes Guidelines if Diabetic/Glucose > 140 If diabetic or lab glucose is > 140 mg/dl - Initiate Diabetes/Hyperglycemia Guidelines & Document Interventions  Outcome: Completed/Met Date Met:  09/18/14  Problem: Phase I Progression Outcomes Goal: Aspirin unless contraindicated Outcome: Completed/Met Date Met:  09/18/14

## 2014-09-18 NOTE — Progress Notes (Signed)
Speech Language Pathology Treatment: Dysphagia  Patient Details Name: Brad Mcintosh MRN: [id number] DOB: Oct 01, 1935 Today's Date: 09/18/2014 Time: 6440-3474 SLP Time Calculation (min) (ACUTE ONLY): 15 min  Assessment / Plan / Recommendation Clinical Impression  Pt appears to have improved swallow function with thin liquids and regular textured solids, however some signs of aspiration were noted (coughing with pill given in applesauce washed down with thin liquid). Some esophageal symptoms were seen during this assessment as well(audible swallow, belching after each bolus). He remains confused and RN reports coughing during meals and oral holding of pills. Due to mental status, nurse reports, and brief length of this session, recommend pt remain on dysphagia 1 (puree) with nectar thick liquids. SLP will continue to follow and will advance diet as appropriate.   HPI HPI: 79 y.o. male with history of A fib, hypertension, ESRD on HD presenting with left sided weakness and confusion found to have an elevated troponin (2.33) in the ED. Admitted for NSTEMI.  Transferred to CCU 12/5 because of hypotension.  MRI pending.     Pertinent Vitals Pain Assessment: No/denies pain  SLP Plan  Continue with current plan of care    Recommendations Diet recommendations: Dysphagia 1 (puree);Nectar-thick liquid Liquids provided via: Cup;Straw Medication Administration: Whole meds with puree Supervision: Full supervision/cueing for compensatory strategies Compensations: Slow rate;Small sips/bites Postural Changes and/or Swallow Maneuvers: Seated upright 90 degrees;Upright 30-60 min after meal              Oral Care Recommendations: Oral care BID Plan: Continue with current plan of care    GO     Eden Emms 09/18/2014, 12:48 PM

## 2014-09-18 NOTE — Progress Notes (Signed)
Central Venous Catheter Insertion Procedure Note Brad Mcintosh [id number] [birth year]  Procedure: Insertion of Central Venous Catheter Indications: Drug and/or fluid administration  Procedure Details Consent: Risks of procedure as well as the alternatives and risks of each were explained to the (patient/caregiver).  Consent for procedure obtained. Time Out: Verified patient identification, verified procedure, site/side was marked, verified correct patient position, special equipment/implants available, medications/allergies/relevent history reviewed, required imaging and test results available.  Performed  Maximum sterile technique was used including antiseptics, cap, gloves, gown, hand hygiene, mask and sheet. Skin prep: Chlorhexidine; local anesthetic administered A antimicrobial bonded/coated triple lumen catheter was placed in the left internal jugular vein using the Seldinger technique. Ultrasound guidance used.Yes.   Catheter placed to 22 cm. Blood aspirated via all 3 ports and then flushed x 3. Line sutured x 2 and dressing applied.  Evaluation Blood flow good Complications: No apparent complications Patient did tolerate procedure well. Chest X-ray ordered to verify placement.  CXR: pending.  Georgann Housekeeper, ACNP [facility] Pulmonology/Critical Care Pager [phone] or [phone]     Rigoberto Noel MD

## 2014-09-18 NOTE — Progress Notes (Signed)
OT Cancellation Note  Patient Details Name: Brad Mcintosh MRN: [id number] DOB: [birth year]   Cancelled Treatment:    Reason Eval/Treat Not Completed: Patient at procedure or test/ unavailable - pt receiving HD.  Will reattempt.   Darlina Rumpf Medina, OTR/L [phone]  09/18/2014, 3:38 PM

## 2014-09-18 NOTE — Plan of Care (Signed)
Problem: Acute Treatment Outcomes Goal: Airway maintained/protected Outcome: Completed/Met Date Met:  09/18/14

## 2014-09-18 NOTE — Plan of Care (Signed)
Problem: Acute Treatment Outcomes Goal: 02 Sats > 94% Outcome: Completed/Met Date Met:  09/18/14  Comments:  On 3L nasal cannula

## 2014-09-18 NOTE — Progress Notes (Signed)
STROKE TEAM PROGRESS NOTE   HISTORY Brad Mcintosh is an 79 y.o. male who is here with the report of weakness left side, associated with confusion, that was noticed prior to his dialysis treatment this morning. He was transferred here by EMS. The patient denies any problems and states he is not sure why he is here. He went to dialysis, from his home this morning by private vehicle, driven by his son. He states that he was able to walk into dialysis. He did not eat breakfast this morning. He denies headache or weakness. He complains of pain in his left upper arm. There's been no known trauma.  Unable to reach family so no further information available. Per review of records he has history of HTN, paroxysmal A fib (on ASA and brilinta), ESRD on HD.   Date last known well: 09/14/2014 Time last known well: 2100 tPA Given: no, outside window  SUBJECTIVE (INTERVAL HISTORY) His RN is at the bedside.  Overall he feels his condition is unchanged. His BP remains labile.   He is on dopamine drip and heparin drip. MRI not able to be done due to the low BP and hemodynamic instability. He has bilateral arm pain, tender to touch. Not able to test muscle strength. He is confused and not able to obtain further history and not cooperative on exam.  OBJECTIVE Temp:  [97.9 F (36.6 C)-99 F (37.2 C)] 98 F (36.7 C) (12/08 1834) Pulse Rate:  [42-134] 87 (12/08 2000) Cardiac Rhythm:  [-] Atrial fibrillation (12/08 2000) Resp:  [13-35] 30 (12/08 2000) BP: (96-131)/(37-97) 108/64 mmHg (12/08 2000) SpO2:  [80 %-100 %] 100 % (12/08 2000) Arterial Line BP: (86-147)/(32-67) 118/44 mmHg (12/08 2000) Weight:  [193 lb 5.5 oz (87.7 kg)] 193 lb 5.5 oz (87.7 kg) (12/08 0400)   Recent Labs Lab 09/16/14 0805 09/16/14 1112 09/16/14 1732 09/17/14 1856 09/17/14 2205  GLUCAP 123* 117* 145* 205* 188*    Recent Labs Lab 09/14/2014 2257 09/16/14 0527 09/16/14 2205 09/17/14 0030 09/17/14 0250 09/18/14 0335  NA   --  142 135* 136* 138 139  K  --  4.2 3.8 3.8 4.0 3.5*  CL  --  99 94* 94* 95* 100  CO2  --  23 22 20 25 19   GLUCOSE  --  117* 201* 202* 211* 219*  BUN  --  24* 32* 31* 37* 52*  CREATININE  --  4.89* 3.26* 3.52* 3.90* 6.88*  CALCIUM  --  8.8 8.4 8.4 8.5 8.6  MG 1.7  --   --  1.9  --   --   PHOS  --   --   --  5.2*  --   --     Recent Labs Lab 09/27/2014 0756 09/17/14 0030  AST 27 30  ALT 17 24  ALKPHOS 57 60  BILITOT 2.9* 2.3*  PROT 6.9 5.8*  ALBUMIN 3.1* 2.2*    Recent Labs Lab 09/14/2014 0756 09/16/14 0527 09/16/14 2205 09/17/14 0250 09/18/14 0335  WBC 18.8* 17.0* 17.4* 19.0* 19.0*  NEUTROABS 17.3*  --  15.5*  --   --   HGB 12.5* 12.3* 12.2* 12.1* 11.1*  HCT 38.9* 37.8* 36.9* 37.1* 32.8*  MCV 89.6 90.9 88.5 88.1 87.0  PLT 141* 118* 110* 121* 105*    Recent Labs Lab 09/29/2014 2103 09/16/14 0527 09/16/14 2205 09/17/14 0250  TROPONINI 2.16* 2.30* 2.37*  2.59* 3.10*    Recent Labs  09/16/14 0527 09/16/14 2205 09/17/14 0250  LABPROT 20.6* 30.5*  21.4*  INR 1.75* 2.89* 1.84*   No results for input(s): COLORURINE, LABSPEC, PHURINE, GLUCOSEU, HGBUR, BILIRUBINUR, KETONESUR, PROTEINUR, UROBILINOGEN, NITRITE, LEUKOCYTESUR in the last 72 hours.  Invalid input(s): APPERANCEUR     Component Value Date/Time   CHOL 79 09/16/2014 0527   TRIG 59 09/16/2014 0527   HDL 21* 09/16/2014 0527   CHOLHDL 3.8 09/16/2014 0527   VLDL 12 09/16/2014 0527   LDLCALC 46 09/16/2014 0527   Lab Results  Component Value Date   HGBA1C 5.4 09/16/2014   No results found for: LABOPIA, COCAINSCRNUR, LABBENZ, AMPHETMU, THCU, LABBARB   Recent Labs Lab 10/10/2014 0756  ETH <11    Ct Head Wo Contrast  09/20/2014   IMPRESSION: 1. No acute intracranial findings. 2. Stable atrophy and chronic small vessel ischemic changes. 3. Prominence of the superior orbital veins of undetermined significance/etiology.    Dg Chest Port 1 View  09/25/2014  IMPRESSION: Low lung volumes.  No evidence of  acute cardiopulmonary disease.     CUS - There is 1-39% left ICA stenosis. Unable to scan right side secondary to patient's refusal to turn head and confusion.   2D echo - pending report.    Component     Latest Ref Rng 09/27/2014 09/16/2014  Cholesterol     0 - 200 mg/dL  79  Triglycerides     <150 mg/dL  59  HDL     >39 mg/dL  21 (L)  Total CHOL/HDL Ratio       3.8  VLDL     0 - 40 mg/dL  12  LDL (calc)     0 - 99 mg/dL  46  Hgb A1c MFr Bld     <5.7 % 5.4 5.4  Mean Plasma Glucose     <117 mg/dL 108 108  Troponin I     <0.30 ng/mL 2.16 (HH) 2.30 (HH)  TSH     0.350 - 4.500 uIU/mL 1.100     PHYSICAL EXAM  Temp:  [97.9 F (36.6 C)-99 F (37.2 C)] 98 F (36.7 C) (12/08 1834) Pulse Rate:  [42-134] 87 (12/08 2000) Resp:  [13-35] 30 (12/08 2000) BP: (96-131)/(37-97) 108/64 mmHg (12/08 2000) SpO2:  [80 %-100 %] 100 % (12/08 2000) Arterial Line BP: (86-147)/(32-67) 118/44 mmHg (12/08 2000) Weight:  [193 lb 5.5 oz (87.7 kg)] 193 lb 5.5 oz (87.7 kg) (12/08 0400)  General - cachectic, well developed, on face mask for O2 and disorientated.  Ophthalmologic - not able to perform due to incooperation.  Cardiovascular - in and out of afib rhythm.  Neuro - awake and alert but confused and oriented to self only Nonfluent hesitant speech. Speaks few words and short sentences only.Dysarthric. Follows a couple of simple commands and then refused to follow commands. Blinking to visual threat bilaterally but not moving eyes as requested. Spontaneously moving eyes to both horizontal directions. PERRL. Facial symmetrical, tongue in middle,     Bilateral UE tender to touch, R>L, not able to test muscle strength. BLE withdraw to pain and able to against gravity. Suspect mild left hemiparesis but cooperation limited Reflex diminished and no babinski.  ASSESSMENT/PLAN Brad Mcintosh is a 79 y.o. male with history of afib not on [facility], CAD and MI s/p stent 10/2013 on ASA and brilinta, HTN, ESRD  on HD was admitted due to confusion and slurry speech prior to HD session. Questionable left sided weakness likely due to pain.    Generalized weakness and painful UEs - likely related to  NSTEMI and unclear etiology for BUE tenderness  Pt has bilaterally UE tender to touch, today R>L, not able to test muscle strength  As per history, he was admitted for generalized weakness, slurry speech  His initially reported left sided weakness seems due to arm pain  Less supicious for stroke etiology MRI  Multiple scattered small  acute infarcts and both cerebral and cerebellar hemispheres. Pattern suggestive of recent  embolic event   MRA  suboptimal motion artefacts but no large vessel stenosis  Carotid Doppler  40-59% bilateral ICAs.   2D Echo  pending  LDL 46, at the goal  HgbA1c 5.4 at the goal  On heparin drip now also for VTE prophylaxis  DIET - DYS 1   aspirin 81 mg orally every day and and brilinta prior to admission, now on heparin IV  NSTEMI - elevated troponin, but he has ESRD and the current level is much less than in 10/2013.  - cardiology managing - on heparin drip - continue to trend troponin  Intermittent Afib - in and out of afib rhythm - on heparin drip - once stable, progress to coumadin bridge with lovenox or think about NOAC - rate control.  Bilateral UE pain - unclear etiology - R>L - recommend X-ray of bilateral shoulder.  CAD and MI s/p stent - on ASA and brilinta prior to admission - currently on heparin drip  Hypotension  On dopamine drip  Once stable, may consider MRI  Avoid hypotension  ESRD on HD - nephrology on board - management per nephrology  Other Stroke Risk Factors  Advanced age  Hospital day # 3 I have personally examined this patient, reviewed notes, independently viewed imaging studies, participated in medical decision making and plan of care. I have made any additions or clarifications directly to the above note. Agree  with note above.  Patient's MRI shows small bilateral embolic strokes which are likely cardioembolic from his acute MI or atrial fibrillation. He is already on heparin and needs to be started on warfarin. May need to consider discontinuing either aspirin or Brilinta to prevent significant risk of hemorrhage  Antony Contras, MD Medical Director [facility] Pager: [phone] 09/18/2014 8:26 PM      To contact Stroke Continuity provider, please refer to [email or web address] After hours, contact General Neurology

## 2014-09-18 NOTE — Progress Notes (Signed)
Ref: [name], MD   Subjective:  Awake. Trying to eat breakfast fed by nurse. T max 99.1 F  Objective:  Vital Signs in the last 24 hours: Temp:  [97.9 F (36.6 C)-99.1 F (37.3 C)] 99 F (37.2 C) (12/08 0400) Pulse Rate:  [41-134] 66 (12/08 1200) Cardiac Rhythm:  [-] Atrial fibrillation (12/08 1200) Resp:  [13-35] 27 (12/08 1200) BP: (65-131)/(43-97) 108/52 mmHg (12/08 1200) SpO2:  [80 %-100 %] 100 % (12/08 1200) Arterial Line BP: (86-147)/(15-89) 135/39 mmHg (12/08 1200) Weight:  [87.7 kg (193 lb 5.5 oz)] 87.7 kg (193 lb 5.5 oz) (12/08 0400)  Physical Exam: BP Readings from Last 1 Encounters:  09/18/14 108/52    Wt Readings from Last 1 Encounters:  09/18/14 87.7 kg (193 lb 5.5 oz)    Weight change: 1.8 kg (3 lb 15.5 oz)  HEENT: [place]/AT, Eyes-Brown, PERL, EOMI, Conjunctiva-Pink, Sclera-Non-icteric Neck: No JVD, No bruit, Trachea midline. Lungs:  Clear, Bilateral. Cardiac:  Regular rhythm, normal S1 and S2, no S3. III/VI systolic murmur. Abdomen:  Soft, non-tender. Extremities:  No edema present. No cyanosis. No clubbing. Bilateral great toes amputation. Left arm AVF. Right subclavian central line. CNS: AxOx2, Cranial nerves grossly intact, moves all 4 extremities. Right handed. Skin: Warm and dry.   Intake/Output from previous day: 12/07 0701 - 12/08 0700 In: 1517.1 [P.O.:540; I.V.:677.1; IV Piggyback:300] Out: -     Lab Results: BMET    Component Value Date/Time   NA 139 09/18/2014 0335   NA 138 09/17/2014 0250   NA 136* 09/17/2014 0030   K 3.5* 09/18/2014 0335   K 4.0 09/17/2014 0250   K 3.8 09/17/2014 0030   CL 100 09/18/2014 0335   CL 95* 09/17/2014 0250   CL 94* 09/17/2014 0030   CO2 19 09/18/2014 0335   CO2 25 09/17/2014 0250   CO2 20 09/17/2014 0030   GLUCOSE 219* 09/18/2014 0335   GLUCOSE 211* 09/17/2014 0250   GLUCOSE 202* 09/17/2014 0030   BUN 52* 09/18/2014 0335   BUN 37* 09/17/2014 0250   BUN 31* 09/17/2014 0030   CREATININE 6.88*  09/18/2014 0335   CREATININE 3.90* 09/17/2014 0250   CREATININE 3.52* 09/17/2014 0030   CALCIUM 8.6 09/18/2014 0335   CALCIUM 8.5 09/17/2014 0250   CALCIUM 8.4 09/17/2014 0030   CALCIUM 7.8* 12/07/2009 0558   CALCIUM 8.7 10/25/2009 1301   CALCIUM 8.5 05/29/2009 0936   GFRNONAA 7* 09/18/2014 0335   GFRNONAA 14* 09/17/2014 0250   GFRNONAA 15* 09/17/2014 0030   GFRAA 8* 09/18/2014 0335   GFRAA 16* 09/17/2014 0250   GFRAA 18* 09/17/2014 0030   CBC    Component Value Date/Time   WBC 19.0* 09/18/2014 0335   RBC 3.77* 09/18/2014 0335   RBC 4.05* 10/21/2013 2230   HGB 11.1* 09/18/2014 0335   HCT 32.8* 09/18/2014 0335   PLT 105* 09/18/2014 0335   MCV 87.0 09/18/2014 0335   MCH 29.4 09/18/2014 0335   MCHC 33.8 09/18/2014 0335   RDW 19.9* 09/18/2014 0335   LYMPHSABS 0.3* 09/16/2014 2205   MONOABS 1.6* 09/16/2014 2205   EOSABS 0.0 09/16/2014 2205   BASOSABS 0.0 09/16/2014 2205   HEPATIC Function Panel  Recent Labs  10/21/13 1644 09/12/2014 0756 09/17/14 0030  PROT 5.4* 6.9 5.8*   HEMOGLOBIN A1C No components found for: HGA1C,  MPG CARDIAC ENZYMES Lab Results  Component Value Date   CKTOTAL 403* 12/09/2009   CKMB 4.9* 12/09/2009   TROPONINI 3.10* 09/17/2014   TROPONINI 2.59* 09/16/2014  TROPONINI 2.37* 09/16/2014   BNP No results for input(s): PROBNP in the last 8760 hours. TSH  Recent Labs  10/23/13 1135 09/18/2014 2257  TSH 1.730 1.100   CHOLESTEROL  Recent Labs  09/16/14 0527  CHOL 79    Scheduled Meds: . aspirin EC  81 mg Oral Daily  . atorvastatin  40 mg Oral q1800  . calcium acetate  667 mg Oral TID WC  .  ceFAZolin (ANCEF) IV  2 g Intravenous Q T,Th,Sa-HD  . feeding supplement (NEPRO CARB STEADY)  237 mL Oral BID BM  . insulin aspart  0-9 Units Subcutaneous TID WC  . metoprolol tartrate  12.5 mg Oral BID  . multivitamin  1 tablet Oral QHS  . pantoprazole  40 mg Oral Daily  . ticagrelor  90 mg Oral BID   Continuous Infusions: . sodium  chloride 10 mL/hr at 09/18/14 0800  . DOPamine Stopped (09/17/14 0740)  . heparin 1,750 Units/hr (09/18/14 1200)  . norepinephrine (LEVOPHED) Adult infusion 9 mcg/min (09/18/14 1200)   PRN Meds:.Place/Maintain arterial line **AND** sodium chloride, acetaminophen, loperamide, nitroGLYCERIN, ondansetron (ZOFRAN) IV, ondansetron, senna-docusate  Assessment/Plan: Small non-Q-wave myocardial infarction with atypical presentation Confusion/mild left-sided weakness  Cardioembolic CVA Hypotensive shock multifactorial i.e. hypovolemia post dialysis/rule out sepsis Atrial fibrillation with controlled ventricular response CAD history of MI 2 in the past status post PCI to LAD and left circumflex in January 2015 Hypertension Diabetes mellitus, II Moderate aortic stenosis Peripheral vascular disease End-stage renal disease on hemodialysis Hypercholesteremia Anemia of chronic disease Tobacco abuse Marked leukocytosis probably secondary to steroids rule out sepsis History of gouty arthritis History of CA of prostate   Continue heparin. Increase activity as tolerated.   LOS: 3 days    Dixie Dials  MD  09/18/2014, 12:55 PM

## 2014-09-18 NOTE — Progress Notes (Signed)
ANTICOAGULATION CONSULT NOTE - Follow Up Consult  Pharmacy Consult for heparin  Indication: nstemi  Allergies  Allergen Reactions  . Altace [Ramipril] Other (See Comments) and Cough    Also renal failure    Patient Measurements: Height: 6' (182.9 cm) Weight: 189 lb 6 oz (85.9 kg) IBW/kg (Calculated) : 77.6  Vital Signs: Temp: 97.9 F (36.6 C) (12/07 2300) Temp Source: Oral (12/07 2300) BP: 100/58 mmHg (12/08 0100) Pulse Rate: 76 (12/08 0100)  Labs:  Recent Labs  09/12/2014 0756  09/16/14 0527 09/16/14 2205 09/17/14 0030 09/17/14 0250 09/17/14 1000 09/18/14 0035  HGB 12.5*  --  12.3* 12.2*  --  12.1*  --   --   HCT 38.9*  --  37.8* 36.9*  --  37.1*  --   --   PLT 141*  --  118* 110*  --  121*  --   --   APTT 35  --   --   --   --  >200*  --   --   LABPROT 21.6*  --  20.6* 30.5*  --  21.4*  --   --   INR 1.86*  --  1.75* 2.89*  --  1.84*  --   --   HEPARINUNFRC  --   < >  --  >2.20*  --   --  <0.10* <0.10*  CREATININE 7.16*  --  4.89* 3.26* 3.52* 3.90*  --   --   TROPONINI  --   < > 2.30* 2.37*  2.59*  --  3.10*  --   --   < > = values in this interval not displayed.  Estimated Creatinine Clearance: 17.4 mL/min (by C-G formula based on Cr of 3.9).    Assessment: 79 yo male on heparin for NSTEMI and also noted with possible CVA. HL remains <0.1 on 1250 units/hr. Drawn appropriately.  Goal of Therapy:  Heparin level = 0.3-0.5 Monitor platelets by anticoagulation protocol: Yes   Plan:  Increase heparin to 1500 units/hr and recheck level in 8 hours.   Curlene Dolphin

## 2014-09-18 NOTE — Progress Notes (Addendum)
INFECTIOUS DISEASE PROGRESS NOTE  ID: Brad Mcintosh is a 79 y.o. male with  Active Problems:   Acute coronary syndrome   Central apnea   Confusion   CVA (cerebral infarction)  Subjective: Without complaints  Abtx:  Anti-infectives    Start     Dose/Rate Route Frequency Ordered Stop   09/18/14 1200  vancomycin (VANCOCIN) IVPB 750 mg/150 ml premix  Status:  Discontinued     750 mg150 mL/hr over 60 Minutes Intravenous Every T-Th-Sa (Hemodialysis) 10/10/2014 2056 09/17/14 1142   09/18/14 1200  ceFAZolin (ANCEF) IVPB 2 g/50 mL premix     2 g100 mL/hr over 30 Minutes Intravenous Every T-Th-Sa (Hemodialysis) 09/17/14 1203     09/17/14 1300  ceFAZolin (ANCEF) IVPB 2 g/50 mL premix     2 g100 mL/hr over 30 Minutes Intravenous  Once 09/17/14 1203 09/17/14 1500   09/19/2014 2200  piperacillin-tazobactam (ZOSYN) IVPB 2.25 g  Status:  Discontinued     2.25 g100 mL/hr over 30 Minutes Intravenous 3 times per day 09/29/2014 2053 09/17/14 1142   09/13/2014 2100  vancomycin (VANCOCIN) 1,500 mg in sodium chloride 0.9 % 500 mL IVPB     1,500 mg250 mL/hr over 120 Minutes Intravenous  Once 09/29/2014 2053 09/16/14 0001      Medications:  Scheduled: . aspirin EC  81 mg Oral Daily  . atorvastatin  40 mg Oral q1800  . calcium acetate  667 mg Oral TID WC  .  ceFAZolin (ANCEF) IV  2 g Intravenous Q T,Th,Sa-HD  . feeding supplement (NEPRO CARB STEADY)  237 mL Oral BID BM  . insulin aspart  0-9 Units Subcutaneous TID WC  . metoprolol tartrate  12.5 mg Oral BID  . multivitamin  1 tablet Oral QHS  . pantoprazole  40 mg Oral Daily  . ticagrelor  90 mg Oral BID    Objective: Vital signs in last 24 hours: Temp:  [97.9 F (36.6 C)-99.1 F (37.3 C)] 99 F (37.2 C) (12/08 0400) Pulse Rate:  [41-134] 62 (12/08 1300) Resp:  [13-35] 18 (12/08 1300) BP: (96-131)/(43-97) 96/46 mmHg (12/08 1300) SpO2:  [80 %-100 %] 81 % (12/08 1300) Arterial Line BP: (86-147)/(15-89) 112/40 mmHg (12/08 1300) Weight:  [87.7 kg (193  lb 5.5 oz)] 87.7 kg (193 lb 5.5 oz) (12/08 0400)   General appearance: alert, cooperative, no distress and slowed mentation Resp: clear to auscultation bilaterally Cardio: regular rate and rhythm GI: normal findings: bowel sounds normal and soft, non-tender Extremities: edema none and RUE graft + bruit  MSE- 1/3 (person)  Lab Results  Recent Labs  09/17/14 0250 09/18/14 0335  WBC 19.0* 19.0*  HGB 12.1* 11.1*  HCT 37.1* 32.8*  NA 138 139  K 4.0 3.5*  CL 95* 100  CO2 25 19  BUN 37* 52*  CREATININE 3.90* 6.88*   Liver Panel  Recent Labs  09/17/14 0030  PROT 5.8*  ALBUMIN 2.2*  AST 30  ALT 24  ALKPHOS 60  BILITOT 2.3*   Sedimentation Rate No results for input(s): ESRSEDRATE in the last 72 hours. C-Reactive Protein No results for input(s): CRP in the last 72 hours.  Microbiology: Recent Results (from the past 240 hour(s))  MRSA PCR Screening     Status: None   Collection Time: 09/30/2014  6:35 PM  Result Value Ref Range Status   MRSA by PCR NEGATIVE NEGATIVE Final    Comment:        The GeneXpert MRSA Assay (FDA approved for NASAL  specimens only), is one component of a comprehensive MRSA colonization surveillance program. It is not intended to diagnose MRSA infection nor to guide or monitor treatment for MRSA infections.   Culture, blood (routine x 2)     Status: None   Collection Time: 09/12/2014  9:03 PM  Result Value Ref Range Status   Specimen Description BLOOD LEFT FOREARM  Final   Special Requests   Final    BOTTLES DRAWN AEROBIC AND ANAEROBIC 10CC AEROBIC Cross ANAEROBIC   Culture  Setup Time   Final    09/16/2014 03:54 Performed at [facility]    Culture   Final    STAPHYLOCOCCUS AUREUS Note: RIFAMPIN AND GENTAMICIN SHOULD NOT BE USED AS SINGLE DRUGS FOR TREATMENT OF STAPH INFECTIONS. Note: Gram Stain Report Called to,Read Back By and Verified With: NICKY BEASLEY 09/16/14 @ 1:39PM BY RUSCOE A. Performed at [facility]    Report  Status 09/18/2014 FINAL  Final   Organism ID, Bacteria STAPHYLOCOCCUS AUREUS  Final      Susceptibility   Staphylococcus aureus - MIC*    CLINDAMYCIN <=0.25 SENSITIVE Sensitive     ERYTHROMYCIN <=0.25 SENSITIVE Sensitive     GENTAMICIN <=0.5 SENSITIVE Sensitive     LEVOFLOXACIN <=0.12 SENSITIVE Sensitive     OXACILLIN 0.5 SENSITIVE Sensitive     PENICILLIN 0.12 SENSITIVE Sensitive     RIFAMPIN <=0.5 SENSITIVE Sensitive     TRIMETH/SULFA <=10 SENSITIVE Sensitive     VANCOMYCIN <=0.5 SENSITIVE Sensitive     TETRACYCLINE <=1 SENSITIVE Sensitive     MOXIFLOXACIN <=0.25 SENSITIVE Sensitive     * STAPHYLOCOCCUS AUREUS  Culture, blood (routine x 2)     Status: None   Collection Time: 09/27/2014  9:03 PM  Result Value Ref Range Status   Specimen Description BLOOD LEFT FOREARM  Final   Special Requests BOTTLES DRAWN AEROBIC AND ANAEROBIC 5CC EACH  Final   Culture  Setup Time   Final    09/16/2014 03:54 Performed at [facility]    Culture   Final    STAPHYLOCOCCUS AUREUS Note: SUSCEPTIBILITIES PERFORMED ON PREVIOUS CULTURE WITHIN THE LAST 5 DAYS. Note: Gram Stain Report Called to,Read Back By and Verified With: NICKY BEASLEY 09/16/14 @ 1:40PM BY RUSCOE A. Performed at [facility]    Report Status 09/18/2014 FINAL  Final  Culture, blood (routine x 2)     Status: None (Preliminary result)   Collection Time: 09/17/14  1:00 PM  Result Value Ref Range Status   Specimen Description BLOOD LEFT ARM  Final   Special Requests BOTTLES DRAWN AEROBIC ONLY 1CC  Final   Culture  Setup Time   Final    09/17/2014 18:24 Performed at [facility]    Culture   Final           BLOOD CULTURE RECEIVED NO GROWTH TO DATE CULTURE WILL BE HELD FOR 5 DAYS BEFORE ISSUING A FINAL NEGATIVE REPORT Performed at [facility]    Report Status PENDING  Incomplete  Clostridium Difficile by PCR     Status: None   Collection Time: 09/18/14  8:15 AM  Result Value Ref Range Status   C  difficile by pcr NEGATIVE NEGATIVE Final    Studies/Results: Mr Brain Wo Contrast  09/17/2014   CLINICAL DATA:  79 year old male with left side weakness, confusion discovered a dialysis treatment this morning. Initial encounter.  EXAM: MRI HEAD WITHOUT CONTRAST  MRA HEAD WITHOUT CONTRAST  TECHNIQUE: Multiplanar, multiecho  pulse sequences of the brain and surrounding structures were obtained without intravenous contrast. Angiographic images of the head were obtained using MRA technique without contrast.  COMPARISON:  Head CT without contrast 09/30/2014 and earlier. Cervical spine MRI 03/16/2012.  FINDINGS: MRI HEAD FINDINGS  The examination had to be discontinued prior to completion due to patient confusion, agitation.  Multiple small bilateral foci of restricted diffusion, including in both cerebellar hemispheres and both cerebral hemispheres. Anterior and posterior circulation territory affected. The largest 2 areas of involvement are in the subcortical white matter of the right peri-Rolandic cortex (10 mm focus), and the left basal ganglia (12 mm focus).  Major intracranial vascular flow voids are preserved. No acute intracranial hemorrhage identified. No midline shift, mass effect, or evidence of intracranial mass lesion. Mild T2 and FLAIR the larger acute sites. Underlying chronic Cortical encephalomalacia in the right parietal lobe, and chronic lacunar infarcts in the thalami.  No midline shift. No ventriculomegaly. Patent basilar cisterns. Visualized orbit soft tissues are within normal limits. Visualized paranasal sinuses and mastoids are clear.  MRA HEAD FINDINGS  Mildly degraded by motion artifact.  Antegrade flow in the posterior circulation with codominant distal vertebral arteries. Patent vertebrobasilar junction. Patent right PICA origin. Basilar artery irregularity but no stenosis. SCA and PCA origins are within normal limits. Detail of the PCA branches is degraded by motion. Posterior  communicating arteries are diminutive or absent.  Antegrade flow in both ICA siphons. Siphon irregularity compatible with atherosclerosis but no siphon stenosis. Patent carotid termini. Patent MCA and ACA origins. MCA and ACA branch detail degraded by motion. No proximal MCA, ACA, or PCA occlusion identified.  IMPRESSION: 1. Multiple scattered small (lacunar type) acute infarcts and both cerebral and cerebellar hemispheres. Pattern suggestive of recent embolic event from the heart or proximal aorta. No associated mass effect or hemorrhage. 2. No circle of Willis large vessel occlusion or stenosis identified. 3. MRI discontinued prior to completion due to patient confusion.   Electronically Signed   By: Lars Pinks M.D.   On: 09/17/2014 18:33   Dg Chest Port 1 View  09/17/2014   CLINICAL DATA:  New central line placement  EXAM: PORTABLE CHEST - 1 VIEW  COMPARISON:  Twelve 5,015  FINDINGS: There is an external pad artifact over the mediastinum. Interval placement of a right central venous line with tip in the distal SVC. No pneumothorax. Low lung volumes and cardiomegaly  IMPRESSION: Placement of right central venous line without complication.   Electronically Signed   By: Suzy Bouchard M.D.   On: 09/17/2014 13:58   Mr Jodene Nam Head/brain Wo Cm  09/17/2014   CLINICAL DATA:  79 year old male with left side weakness, confusion discovered a dialysis treatment this morning. Initial encounter.  EXAM: MRI HEAD WITHOUT CONTRAST  MRA HEAD WITHOUT CONTRAST  TECHNIQUE: Multiplanar, multiecho pulse sequences of the brain and surrounding structures were obtained without intravenous contrast. Angiographic images of the head were obtained using MRA technique without contrast.  COMPARISON:  Head CT without contrast 09/19/2014 and earlier. Cervical spine MRI 03/16/2012.  FINDINGS: MRI HEAD FINDINGS  The examination had to be discontinued prior to completion due to patient confusion, agitation.  Multiple small bilateral foci of  restricted diffusion, including in both cerebellar hemispheres and both cerebral hemispheres. Anterior and posterior circulation territory affected. The largest 2 areas of involvement are in the subcortical white matter of the right peri-Rolandic cortex (10 mm focus), and the left basal ganglia (12 mm focus).  Major intracranial vascular  flow voids are preserved. No acute intracranial hemorrhage identified. No midline shift, mass effect, or evidence of intracranial mass lesion. Mild T2 and FLAIR the larger acute sites. Underlying chronic Cortical encephalomalacia in the right parietal lobe, and chronic lacunar infarcts in the thalami.  No midline shift. No ventriculomegaly. Patent basilar cisterns. Visualized orbit soft tissues are within normal limits. Visualized paranasal sinuses and mastoids are clear.  MRA HEAD FINDINGS  Mildly degraded by motion artifact.  Antegrade flow in the posterior circulation with codominant distal vertebral arteries. Patent vertebrobasilar junction. Patent right PICA origin. Basilar artery irregularity but no stenosis. SCA and PCA origins are within normal limits. Detail of the PCA branches is degraded by motion. Posterior communicating arteries are diminutive or absent.  Antegrade flow in both ICA siphons. Siphon irregularity compatible with atherosclerosis but no siphon stenosis. Patent carotid termini. Patent MCA and ACA origins. MCA and ACA branch detail degraded by motion. No proximal MCA, ACA, or PCA occlusion identified.  IMPRESSION: 1. Multiple scattered small (lacunar type) acute infarcts and both cerebral and cerebellar hemispheres. Pattern suggestive of recent embolic event from the heart or proximal aorta. No associated mass effect or hemorrhage. 2. No circle of Willis large vessel occlusion or stenosis identified. 3. MRI discontinued prior to completion due to patient confusion.   Electronically Signed   By: Lars Pinks M.D.   On: 09/17/2014 18:33      Assessment/Plan: Staph bacteremia/ MSSA CVA CAD Non-Q MI Afib DM2 As ESRD Diarrhea  Total days of antibiotics: 4 ancef  MRI suggestive of lacunar embolic disease, limited by pt confusion.  Await his repeat BCx Suggest TEE He will need 6 weeks of anbx C diff (-)          Bobby Rumpf Infectious Diseases (pager) [phone] [email or web address] 09/18/2014, 1:53 PM  LOS: 3 days

## 2014-09-18 NOTE — Plan of Care (Signed)
Problem: Consults Goal: Skin Care Protocol Initiated - if Braden Score 18 or less If consults are not indicated, leave blank or document N/A Outcome: Progressing     

## 2014-09-18 NOTE — Plan of Care (Signed)
Problem: Consults Goal: Ischemic Stroke Patient Education See Patient Education Module for education specifics. Outcome: Progressing     

## 2014-09-18 NOTE — Plan of Care (Signed)
Problem: SLP Dysphagia Goals Goal: Patient will utilize recommended strategies Patient will utilize recommended strategies during swallow to increase swallowing safety with  Outcome: Progressing     

## 2014-09-18 NOTE — Progress Notes (Addendum)
ANTICOAGULATION CONSULT NOTE - Follow Up Consult  Pharmacy Consult for heparin  Indication: nstemi  Allergies  Allergen Reactions  . Altace [Ramipril] Other (See Comments) and Cough    Also renal failure    Patient Measurements: Height: 6' (182.9 cm) Weight: 193 lb 5.5 oz (87.7 kg) IBW/kg (Calculated) : 77.6  Vital Signs: Temp: 99 F (37.2 C) (12/08 0400) Temp Source: Axillary (12/08 0400) BP: 130/65 mmHg (12/08 1000) Pulse Rate: 63 (12/08 1000)  Labs:  Recent Labs  09/16/14 0527 09/16/14 2205 09/17/14 0030 09/17/14 0250 09/17/14 1000 09/18/14 0035 09/18/14 0335 09/18/14 1000  HGB 12.3* 12.2*  --  12.1*  --   --  11.1*  --   HCT 37.8* 36.9*  --  37.1*  --   --  32.8*  --   PLT 118* 110*  --  121*  --   --  105*  --   APTT  --   --   --  >200*  --   --   --   --   LABPROT 20.6* 30.5*  --  21.4*  --   --   --   --   INR 1.75* 2.89*  --  1.84*  --   --   --   --   HEPARINUNFRC  --  >2.20*  --   --  <0.10* <0.10*  --  <0.10*  CREATININE 4.89* 3.26* 3.52* 3.90*  --   --  6.88*  --   TROPONINI 2.30* 2.37*  2.59*  --  3.10*  --   --   --   --     Estimated Creatinine Clearance: 9.9 mL/min (by C-G formula based on Cr of 6.88).    Assessment: 79 yo male on heparin for NSTEMI and also noted with possible CVA. HL remains <0.1 on 1500 units/hr. The level was collected appropriately and no infusion problems have been noted.   Goal of Therapy:  Heparin level = 0.3-0.5 Monitor platelets by anticoagulation protocol: Yes   Plan:  -Increase heparin to 1750 units/hr -Heparin level in 8hrs and daily  Hildred Laser, Pharm D 09/18/2014 11:52 AM   Addendum: heparin  Plan -Will increase heparin to 1850 units/hr (~ 4 units/kg/hr compared to 1500 units/hr) due to consistent heparin levels < 0.1.  -Heparin level in 8 hrs  Hildred Laser, Pharm D 09/18/2014 3:05 PM

## 2014-09-18 NOTE — Progress Notes (Signed)
Assessment/Plan 1. CVA- Neurology seeing, for MRI if BP stabilizes 2. Nstemi / Hx CAD w occluded RCA and stents to LAD/LCx- per Card 3. ESRD - HD TTS  4. HTN/vol, now low BPs - on clon/ MTP at home. Was below dry wt of 85kg, but not now, CXR negative, no extra vol on exam. 5. Anemia - HGb12.5 hold esa continue weekly fe q thurs hd 6. Metabolic bone disease -hectorol 62mcg q hd & binder when eating 7. A. Fib -per Dr. Terrence Dupont   Plan- For dialysis today with no fluid removal planned  Subjective: Interval History: none.  Objective: Vital signs in last 24 hours: Temp:  [97.9 F (36.6 C)-99.1 F (37.3 C)] 99 F (37.2 C) (12/08 0400) Pulse Rate:  [41-134] 53 (12/08 0900) Resp:  [16-35] 27 (12/08 0900) BP: (65-131)/(45-97) 101/50 mmHg (12/08 0900) SpO2:  [80 %-100 %] 99 % (12/08 0900) Arterial Line BP: (86-147)/(15-89) 124/44 mmHg (12/08 0900) Weight:  [87.7 kg (193 lb 5.5 oz)] 87.7 kg (193 lb 5.5 oz) (12/08 0400) Weight change: 1.8 kg (3 lb 15.5 oz)  Intake/Output from previous day: 12/07 0701 - 12/08 0700 In: 1517.1 [P.O.:540; I.V.:677.1; IV Piggyback:300] Out: -  Intake/Output this shift: Total I/O In: 65 [I.V.:65] Out: -   General appearance: alert Chest wall: no tenderness GI: soft, non-tender; bowel sounds normal; no masses,  no organomegaly Extremities: extremities normal, atraumatic, no cyanosis or edema  Able to follow commands better today with no obvious neglect of left side Lab Results:  Recent Labs  09/17/14 0250 09/18/14 0335  WBC 19.0* 19.0*  HGB 12.1* 11.1*  HCT 37.1* 32.8*  PLT 121* 105*   BMET:  Recent Labs  09/17/14 0250 09/18/14 0335  NA 138 139  K 4.0 3.5*  CL 95* 100  CO2 25 19  GLUCOSE 211* 219*  BUN 37* 52*  CREATININE 3.90* 6.88*  CALCIUM 8.5 8.6   No results for input(s): PTH in the last 72 hours. Iron Studies: No results for input(s): IRON, TIBC, TRANSFERRIN, FERRITIN in the last 72 hours. Studies/Results: Mr Herby Abraham  Contrast  09/17/2014   CLINICAL DATA:  79 year old male with left side weakness, confusion discovered a dialysis treatment this morning. Initial encounter.  EXAM: MRI HEAD WITHOUT CONTRAST  MRA HEAD WITHOUT CONTRAST  TECHNIQUE: Multiplanar, multiecho pulse sequences of the brain and surrounding structures were obtained without intravenous contrast. Angiographic images of the head were obtained using MRA technique without contrast.  COMPARISON:  Head CT without contrast 09/20/2014 and earlier. Cervical spine MRI 03/16/2012.  FINDINGS: MRI HEAD FINDINGS  The examination had to be discontinued prior to completion due to patient confusion, agitation.  Multiple small bilateral foci of restricted diffusion, including in both cerebellar hemispheres and both cerebral hemispheres. Anterior and posterior circulation territory affected. The largest 2 areas of involvement are in the subcortical white matter of the right peri-Rolandic cortex (10 mm focus), and the left basal ganglia (12 mm focus).  Major intracranial vascular flow voids are preserved. No acute intracranial hemorrhage identified. No midline shift, mass effect, or evidence of intracranial mass lesion. Mild T2 and FLAIR the larger acute sites. Underlying chronic Cortical encephalomalacia in the right parietal lobe, and chronic lacunar infarcts in the thalami.  No midline shift. No ventriculomegaly. Patent basilar cisterns. Visualized orbit soft tissues are within normal limits. Visualized paranasal sinuses and mastoids are clear.  MRA HEAD FINDINGS  Mildly degraded by motion artifact.  Antegrade flow in the posterior circulation with codominant distal vertebral arteries.  Patent vertebrobasilar junction. Patent right PICA origin. Basilar artery irregularity but no stenosis. SCA and PCA origins are within normal limits. Detail of the PCA branches is degraded by motion. Posterior communicating arteries are diminutive or absent.  Antegrade flow in both ICA siphons.  Siphon irregularity compatible with atherosclerosis but no siphon stenosis. Patent carotid termini. Patent MCA and ACA origins. MCA and ACA branch detail degraded by motion. No proximal MCA, ACA, or PCA occlusion identified.  IMPRESSION: 1. Multiple scattered small (lacunar type) acute infarcts and both cerebral and cerebellar hemispheres. Pattern suggestive of recent embolic event from the heart or proximal aorta. No associated mass effect or hemorrhage. 2. No circle of Willis large vessel occlusion or stenosis identified. 3. MRI discontinued prior to completion due to patient confusion.   Electronically Signed   By: Lars Pinks M.D.   On: 09/17/2014 18:33   Dg Chest Port 1 View  09/17/2014   CLINICAL DATA:  New central line placement  EXAM: PORTABLE CHEST - 1 VIEW  COMPARISON:  Twelve 5,015  FINDINGS: There is an external pad artifact over the mediastinum. Interval placement of a right central venous line with tip in the distal SVC. No pneumothorax. Low lung volumes and cardiomegaly  IMPRESSION: Placement of right central venous line without complication.   Electronically Signed   By: Suzy Bouchard M.D.   On: 09/17/2014 13:58   Mr Jodene Nam Head/brain Wo Cm  09/17/2014   CLINICAL DATA:  79 year old male with left side weakness, confusion discovered a dialysis treatment this morning. Initial encounter.  EXAM: MRI HEAD WITHOUT CONTRAST  MRA HEAD WITHOUT CONTRAST  TECHNIQUE: Multiplanar, multiecho pulse sequences of the brain and surrounding structures were obtained without intravenous contrast. Angiographic images of the head were obtained using MRA technique without contrast.  COMPARISON:  Head CT without contrast 09/21/2014 and earlier. Cervical spine MRI 03/16/2012.  FINDINGS: MRI HEAD FINDINGS  The examination had to be discontinued prior to completion due to patient confusion, agitation.  Multiple small bilateral foci of restricted diffusion, including in both cerebellar hemispheres and both cerebral  hemispheres. Anterior and posterior circulation territory affected. The largest 2 areas of involvement are in the subcortical white matter of the right peri-Rolandic cortex (10 mm focus), and the left basal ganglia (12 mm focus).  Major intracranial vascular flow voids are preserved. No acute intracranial hemorrhage identified. No midline shift, mass effect, or evidence of intracranial mass lesion. Mild T2 and FLAIR the larger acute sites. Underlying chronic Cortical encephalomalacia in the right parietal lobe, and chronic lacunar infarcts in the thalami.  No midline shift. No ventriculomegaly. Patent basilar cisterns. Visualized orbit soft tissues are within normal limits. Visualized paranasal sinuses and mastoids are clear.  MRA HEAD FINDINGS  Mildly degraded by motion artifact.  Antegrade flow in the posterior circulation with codominant distal vertebral arteries. Patent vertebrobasilar junction. Patent right PICA origin. Basilar artery irregularity but no stenosis. SCA and PCA origins are within normal limits. Detail of the PCA branches is degraded by motion. Posterior communicating arteries are diminutive or absent.  Antegrade flow in both ICA siphons. Siphon irregularity compatible with atherosclerosis but no siphon stenosis. Patent carotid termini. Patent MCA and ACA origins. MCA and ACA branch detail degraded by motion. No proximal MCA, ACA, or PCA occlusion identified.  IMPRESSION: 1. Multiple scattered small (lacunar type) acute infarcts and both cerebral and cerebellar hemispheres. Pattern suggestive of recent embolic event from the heart or proximal aorta. No associated mass effect or hemorrhage. 2. No circle  of Willis large vessel occlusion or stenosis identified. 3. MRI discontinued prior to completion due to patient confusion.   Electronically Signed   By: Lars Pinks M.D.   On: 09/17/2014 18:33   Scheduled: . aspirin EC  81 mg Oral Daily  . atorvastatin  40 mg Oral q1800  . calcium acetate  667 mg  Oral TID WC  .  ceFAZolin (ANCEF) IV  2 g Intravenous Q T,Th,Sa-HD  . feeding supplement (NEPRO CARB STEADY)  237 mL Oral BID BM  . Influenza vac split quadrivalent PF  0.5 mL Intramuscular Tomorrow-1000  . insulin aspart  0-9 Units Subcutaneous TID WC  . metoprolol tartrate  12.5 mg Oral BID  . multivitamin  1 tablet Oral QHS  . pantoprazole  40 mg Oral Daily  . pneumococcal 23 valent vaccine  0.5 mL Intramuscular Tomorrow-1000  . ticagrelor  90 mg Oral BID     LOS: 3 days   [place] Forker C 09/18/2014,9:36 AM

## 2014-09-19 LAB — PROTIME-INR
INR: 1.58 — ABNORMAL HIGH (ref 0.00–1.49)
Prothrombin Time: 19 seconds — ABNORMAL HIGH (ref 11.6–15.2)

## 2014-09-19 LAB — GLUCOSE, CAPILLARY
GLUCOSE-CAPILLARY: 193 mg/dL — AB (ref 70–99)
Glucose-Capillary: 176 mg/dL — ABNORMAL HIGH (ref 70–99)

## 2014-09-19 MED ORDER — GERHARDT'S BUTT CREAM
TOPICAL_CREAM | Freq: Four times a day (QID) | CUTANEOUS | Status: DC | PRN
  Administered 2014-09-24 – 2014-09-25 (×2): 1 via TOPICAL
  Filled 2014-09-19 (×2): qty 1

## 2014-09-19 MED ORDER — WARFARIN SODIUM 2 MG PO TABS
2.0000 mg | ORAL_TABLET | Freq: Once | ORAL | Status: AC
  Administered 2014-09-19: 2 mg via ORAL
  Filled 2014-09-19: qty 1

## 2014-09-19 MED ORDER — SODIUM CHLORIDE 0.9 % IV SOLN
Freq: Once | INTRAVENOUS | Status: AC
  Administered 2014-09-19: 500 mL via INTRAVENOUS

## 2014-09-19 MED ORDER — DIGOXIN 0.25 MG/ML IJ SOLN
0.2500 mg | Freq: Every day | INTRAMUSCULAR | Status: AC
  Administered 2014-09-19 – 2014-09-20 (×2): 0.25 mg via INTRAVENOUS
  Filled 2014-09-19 (×2): qty 1

## 2014-09-19 MED ORDER — TICAGRELOR 60 MG PO TABS
60.0000 mg | ORAL_TABLET | Freq: Two times a day (BID) | ORAL | Status: DC
  Administered 2014-09-19 – 2014-09-27 (×16): 60 mg via ORAL
  Filled 2014-09-19 (×21): qty 1

## 2014-09-19 MED ORDER — DEXTROSE-NACL 5-0.45 % IV SOLN
INTRAVENOUS | Status: DC
  Administered 2014-09-19 – 2014-09-24 (×3): via INTRAVENOUS

## 2014-09-19 MED ORDER — ALPRAZOLAM 0.25 MG PO TABS
0.2500 mg | ORAL_TABLET | Freq: Three times a day (TID) | ORAL | Status: DC | PRN
  Administered 2014-09-19: 0.25 mg via ORAL
  Filled 2014-09-19: qty 1

## 2014-09-19 MED ORDER — WARFARIN - PHARMACIST DOSING INPATIENT
Freq: Every day | Status: DC
  Administered 2014-09-27: 17:00:00

## 2014-09-19 NOTE — Progress Notes (Addendum)
ANTICOAGULATION CONSULT NOTE - Follow Up Consult  Pharmacy Consult for coumadin Indication: afib/CVA  Allergies  Allergen Reactions  . Altace [Ramipril] Other (See Comments) and Cough    Also renal failure    Patient Measurements: Height: 6' (182.9 cm) Weight: 193 lb 5.5 oz (87.7 kg) IBW/kg (Calculated) : 77.6  Vital Signs: Temp: 98.1 F (36.7 C) (12/09 0800) Temp Source: Oral (12/09 0800) BP: 90/60 mmHg (12/09 1000) Pulse Rate: 53 (12/09 1000)  Labs:  Recent Labs  09/16/14 2205 09/17/14 0030 09/17/14 0250 09/17/14 1000 09/18/14 0035 09/18/14 0335 09/18/14 1000 09/19/14 0930  HGB 12.2*  --  12.1*  --   --  11.1*  --   --   HCT 36.9*  --  37.1*  --   --  32.8*  --   --   PLT 110*  --  121*  --   --  105*  --   --   APTT  --   --  >200*  --   --   --   --   --   LABPROT 30.5*  --  21.4*  --   --   --   --  19.0*  INR 2.89*  --  1.84*  --   --   --   --  1.58*  HEPARINUNFRC >2.20*  --   --  <0.10* <0.10*  --  <0.10*  --   CREATININE 3.26* 3.52* 3.90*  --   --  6.88*  --   --   TROPONINI 2.37*  2.59*  --  3.10*  --   --   --   --   --     Estimated Creatinine Clearance: 9.9 mL/min (by C-G formula based on Cr of 6.88).   Medications:  Scheduled:  . atorvastatin  40 mg Oral q1800  . calcium acetate  667 mg Oral TID WC  .  ceFAZolin (ANCEF) IV  2 g Intravenous Q T,Th,Sa-HD  . digoxin  0.25 mg Intravenous Daily  . feeding supplement (NEPRO CARB STEADY)  237 mL Oral BID BM  . insulin aspart  0-9 Units Subcutaneous TID WC  . metoprolol tartrate  12.5 mg Oral BID  . midazolam  1 mg Intravenous Once  . multivitamin  1 tablet Oral QHS  . pantoprazole  40 mg Oral Daily  . sodium chloride  10-40 mL Intracatheter Q12H  . ticagrelor  60 mg Oral BID    Assessment: 79 yo male with NSTEMI and embolic CVA with afib to begin coumadin. Patient was on heparin 12/8 and no further plans for heparin per Dr. Doylene Canard.  Aspirin to be discontinued and Brilinta to change to 60mg  po  bid per Dr. Merrilee Jansky request in setting of stent placed 11/09/2013 and hx of MI x2.    INR noted 2.89 on 12/6 (no coumadin PTA) and now 1.58 (trend down). On 12/7 AST/ALT= 30/24, t bili= 2.3 and albumin= 2.3  Goal of Therapy:  INR 2-3 Monitor platelets by anticoagulation protocol: Yes   Plan:   -Coumadin 2mg  po x1 (low dose due to elevated INR) -Daily PT/INR -Coumadin compliance could be a challenge  Hildred Laser, Pharm D 09/19/2014 10:53 AM

## 2014-09-19 NOTE — Progress Notes (Signed)
Physical Therapy Treatment Patient Details Name: Brad Mcintosh MRN: [id number] DOB: 1934-12-20 Today's Date: 09/19/2014    History of Present Illness Pt adm with lt sided weakness and slurred speech.  Multiple scattered small (lacunar type) acute infarcts in the cerebrum and cerebellum. PMH - ESRD on HD, CAD, PVD, HTN, toe amputations    PT Comments    Pt more alert today but still requiring +2 total Assist for bed mobility and unsafe to transfer bed to chair due to strong posterior lean and resistance to movement. Maintained sitting EOB for 10 min though with max A. PT will continue to follow.   Follow Up Recommendations  SNF;Supervision/Assistance - 24 hour     Equipment Recommendations  Other (comment) (TBD)    Recommendations for Other Services       Precautions / Restrictions Precautions Precautions: Fall Restrictions Weight Bearing Restrictions: No    Mobility  Bed Mobility Overal bed mobility: Needs Assistance Bed Mobility: Supine to Sit;Sit to Supine;Rolling Rolling: +2 for physical assistance;Total assist   Supine to sit: +2 for physical assistance;Total assist Sit to supine: +2 for physical assistance;Total assist   General bed mobility comments: rolled multiple times for bowel clean up and pt initally resisting mvmt but once in SL, helped maintain position by holding to bed rail. Very stiff with all mvmts. Performed SL to sit with +2 tot A and again pt resisting motion, needed verbal and tactile cues for bending at hip to achieve and maintain sitting position.   Transfers                 General transfer comment: unable to safely transfer with strong posterior lean and pt resistance  Ambulation/Gait             General Gait Details: unable   Stairs            Wheelchair Mobility    Modified Rankin (Stroke Patients Only) Modified Rankin (Stroke Patients Only) Pre-Morbid Rankin Score: No symptoms Modified Rankin: Severe disability     Balance Overall balance assessment: Needs assistance Sitting-balance support: Bilateral upper extremity supported;Feet supported Sitting balance-Leahy Scale: Zero Sitting balance - Comments: sat EOB x 10 mins with max A, pt with strong posterior lean, especially when he was more alert Postural control: Posterior lean                          Cognition Arousal/Alertness: Awake/alert Behavior During Therapy: Agitated Overall Cognitive Status: Impaired/Different from baseline Area of Impairment: Problem solving;Memory;Orientation;Following commands;Attention Orientation Level: Place;Time;Situation Current Attention Level: Focused Memory: Decreased short-term memory Following Commands: Follows one step commands inconsistently     Problem Solving: Slow processing;Requires verbal cues;Requires tactile cues;Decreased initiation General Comments: lt inattention/neglect    Exercises      General Comments        Pertinent Vitals/Pain Pain Assessment: Faces Faces Pain Scale: Hurts even more Pain Location: pulling away and grimacing with touch alost anywhere on body and mentioned "the gout" several times.  Pain Intervention(s): Monitored during session;Repositioned;Limited activity within patient's tolerance    Home Living                      Prior Function            PT Goals (current goals can now be found in the care plan section) Acute Rehab PT Goals Patient Stated Goal: did not state PT Goal Formulation: Patient unable to participate in  goal setting Time For Goal Achievement: 10/01/14 Potential to Achieve Goals: Fair Progress towards PT goals: Progressing toward goals    Frequency  Min 2X/week    PT Plan Frequency needs to be updated    Co-evaluation             End of Session   Activity Tolerance: Treatment limited secondary to agitation Patient left: in bed;with call bell/phone within reach     Time: 1016-1039 PT Time Calculation  (min) (ACUTE ONLY): 23 min  Charges:  $Therapeutic Activity: 23-37 mins                    G Codes:      [place], [place] 10-19-14, 12:05 PM

## 2014-09-19 NOTE — Progress Notes (Signed)
STROKE TEAM PROGRESS NOTE   HISTORY Brad Mcintosh is an 79 y.o. male who is here with the report of weakness left side, associated with confusion, that was noticed prior to his dialysis treatment this morning. He was transferred here by EMS. The patient denies any problems and states he is not sure why he is here. He went to dialysis, from his home this morning by private vehicle, driven by his son. He states that he was able to walk into dialysis. He did not eat breakfast this morning. He denies headache or weakness. He complains of pain in his left upper arm. There's been no known trauma.  Unable to reach family so no further information available. Per review of records he has history of HTN, paroxysmal A fib (on ASA and brilinta), ESRD on HD.   Date last known well: 09/14/2014 Time last known well: 2100 tPA Given: no, outside window  SUBJECTIVE (INTERVAL HISTORY) His RN is at the bedside.  Overall he feels his condition is unchanged. His BP remains labile.   He is on levophed drip and heparin drip was discontinued. Warfarin ordered..  OBJECTIVE Temp:  [97.7 F (36.5 C)-100.3 F (37.9 C)] 99 F (37.2 C) (12/09 1700) Pulse Rate:  [51-99] 99 (12/09 1900) Cardiac Rhythm:  [-] Atrial fibrillation (12/09 1200) Resp:  [15-41] 30 (12/09 1900) BP: (81-131)/(34-77) 104/59 mmHg (12/09 1900) SpO2:  [92 %-100 %] 99 % (12/09 1900) Arterial Line BP: (89-157)/(43-75) 120/53 mmHg (12/09 1900)   Recent Labs Lab 09/17/14 1856 09/17/14 2205 09/18/14 1835 09/19/14 1222 09/19/14 1713  GLUCAP 205* 188* 119* 193* 176*    Recent Labs Lab 10/03/2014 2257 09/16/14 0527 09/16/14 2205 09/17/14 0030 09/17/14 0250 09/18/14 0335  NA  --  142 135* 136* 138 139  K  --  4.2 3.8 3.8 4.0 3.5*  CL  --  99 94* 94* 95* 100  CO2  --  23 22 20 25 19   GLUCOSE  --  117* 201* 202* 211* 219*  BUN  --  24* 32* 31* 37* 52*  CREATININE  --  4.89* 3.26* 3.52* 3.90* 6.88*  CALCIUM  --  8.8 8.4 8.4 8.5 8.6   MG 1.7  --   --  1.9  --   --   PHOS  --   --   --  5.2*  --   --     Recent Labs Lab 10/05/2014 0756 09/17/14 0030  AST 27 30  ALT 17 24  ALKPHOS 57 60  BILITOT 2.9* 2.3*  PROT 6.9 5.8*  ALBUMIN 3.1* 2.2*    Recent Labs Lab 10/04/2014 0756 09/16/14 0527 09/16/14 2205 09/17/14 0250 09/18/14 0335  WBC 18.8* 17.0* 17.4* 19.0* 19.0*  NEUTROABS 17.3*  --  15.5*  --   --   HGB 12.5* 12.3* 12.2* 12.1* 11.1*  HCT 38.9* 37.8* 36.9* 37.1* 32.8*  MCV 89.6 90.9 88.5 88.1 87.0  PLT 141* 118* 110* 121* 105*    Recent Labs Lab 09/17/2014 2103 09/16/14 0527 09/16/14 2205 09/17/14 0250  TROPONINI 2.16* 2.30* 2.37*  2.59* 3.10*    Recent Labs  09/16/14 2205 09/17/14 0250 09/19/14 0930  LABPROT 30.5* 21.4* 19.0*  INR 2.89* 1.84* 1.58*   No results for input(s): COLORURINE, LABSPEC, PHURINE, GLUCOSEU, HGBUR, BILIRUBINUR, KETONESUR, PROTEINUR, UROBILINOGEN, NITRITE, LEUKOCYTESUR in the last 72 hours.  Invalid input(s): APPERANCEUR     Component Value Date/Time   CHOL 79 09/16/2014 0527   TRIG 59 09/16/2014 0527   HDL 21*  09/16/2014 0527   CHOLHDL 3.8 09/16/2014 0527   VLDL 12 09/16/2014 0527   LDLCALC 46 09/16/2014 0527   Lab Results  Component Value Date   HGBA1C 5.4 09/16/2014   No results found for: LABOPIA, COCAINSCRNUR, LABBENZ, AMPHETMU, THCU, LABBARB   Recent Labs Lab 09/14/2014 0756  ETH <11    Ct Head Wo Contrast  09/14/2014   IMPRESSION: 1. No acute intracranial findings. 2. Stable atrophy and chronic small vessel ischemic changes. 3. Prominence of the superior orbital veins of undetermined significance/etiology.    Dg Chest Port 1 View  09/27/2014  IMPRESSION: Low lung volumes.  No evidence of acute cardiopulmonary disease.     CUS - There is 1-39% left ICA stenosis. Unable to scan right side secondary to patient's refusal to turn head and confusion.   2D echo - pending report.    Component     Latest Ref Rng 10/06/2014 09/16/2014  Cholesterol      0 - 200 mg/dL  79  Triglycerides     <150 mg/dL  59  HDL     >39 mg/dL  21 (L)  Total CHOL/HDL Ratio       3.8  VLDL     0 - 40 mg/dL  12  LDL (calc)     0 - 99 mg/dL  46  Hgb A1c MFr Bld     <5.7 % 5.4 5.4  Mean Plasma Glucose     <117 mg/dL 108 108  Troponin I     <0.30 ng/mL 2.16 (HH) 2.30 (HH)  TSH     0.350 - 4.500 uIU/mL 1.100     PHYSICAL EXAM  Temp:  [97.7 F (36.5 C)-100.3 F (37.9 C)] 99 F (37.2 C) (12/09 1700) Pulse Rate:  [51-99] 99 (12/09 1900) Resp:  [15-41] 30 (12/09 1900) BP: (81-131)/(34-77) 104/59 mmHg (12/09 1900) SpO2:  [92 %-100 %] 99 % (12/09 1900) Arterial Line BP: (89-157)/(43-75) 120/53 mmHg (12/09 1900)  General - cachectic, well developed, on face mask for O2 and disorientated.  Ophthalmologic - not able to perform due to incooperation.  Cardiovascular - in and out of afib rhythm.  Neuro - awake and alert but confused and oriented to self only Nonfluent hesitant speech. Speaks few words and short sentences only.Dysarthric. Follows a couple of simple commands and then refused to follow commands. Blinking to visual threat bilaterally but not moving eyes as requested. Spontaneously moving eyes to both horizontal directions. PERRL. Facial symmetrical, tongue in middle,     Bilateral UE tender to touch, R>L, not able to test muscle strength. BLE withdraw to pain and able to against gravity. Suspect mild left hemiparesis but cooperation limited Reflex diminished and no babinski.  ASSESSMENT/PLAN [name] is a 79 y.o. male with history of afib not on [facility], CAD and MI s/p stent 10/2013 on ASA and brilinta, HTN, ESRD on HD was admitted due to confusion and slurry speech prior to HD session. Questionable left sided weakness likely due to pain.    Generalized weakness and painful UEs - likely related to NSTEMI and unclear etiology for BUE tenderness  Pt has bilaterally UE tender to touch, today R>L, not able to test muscle strength  As per  history, he was admitted for generalized weakness, slurry speech  His initially reported left sided weakness seems due to arm pain  Less supicious for stroke etiology MRI  Multiple scattered small  acute infarcts and both cerebral and cerebellar hemispheres. Pattern suggestive of recent  embolic event   MRA  suboptimal motion artefacts but no large vessel stenosis  Carotid Doppler  40-59% bilateral ICAs.   2D Echo  pending  LDL 46, at the goal  HgbA1c 5.4 at the goal  On heparin drip now also for VTE prophylaxis  DIET - DYS 1   aspirin 81 mg orally every day and and brilinta prior to admission, now on heparin IV  NSTEMI - elevated troponin, but he has ESRD and the current level is much less than in 10/2013.  - cardiology managing - on heparin drip - continue to trend troponin  Intermittent Afib - in and out of afib rhythm - on heparin drip - once stable, progress to coumadin bridge with lovenox or think about NOAC - rate control.  Bilateral UE pain - unclear etiology - R>L - recommend X-ray of bilateral shoulder.  CAD and MI s/p stent - on ASA and brilinta prior to admission - currently on heparin drip  Hypotension  On dopamine drip  Once stable, may consider MRI  Avoid hypotension  ESRD on HD - nephrology on board - management per nephrology  Other Stroke Risk Factors  Advanced age  Hospital day # 4 I have personally examined this patient, reviewed notes, independently viewed imaging studies, participated in medical decision making and plan of care. I have made any additions or clarifications directly to the above note. Agree with note above.  I have spoken with patient's son Zaremba over the phone about his overall prognosis being poor given cardiogenic shock, recent MI, renal failure and multiple small embolic strokes. He likely needs to be on long-term anticoagulation but is at high risk for bleeding given the fact that he needs to be on aspirin and  Brillinta from cardiac standpoint and warfarin from stroke standpoint. The son understands but wants him on full support for now. I would strongly encourage consideration for Comfort Care/DO NOT RESUSCITATE . Discussed with Dr. Doylene Canard. Antony Contras, MD Medical Director [facility] Pager: [phone] 09/19/2014 8:17 PM      To contact Stroke Continuity provider, please refer to [email or web address] After hours, contact General Neurology

## 2014-09-19 NOTE — Plan of Care (Signed)
Problem: Consults Goal: Ischemic Stroke Patient Education See Patient Education Module for education specifics.  Outcome: Not Met (add Reason) Pt is unable

## 2014-09-19 NOTE — Progress Notes (Addendum)
MD at bedside to evaluate pt's neuro status and decreasing HR. Orders received to give bolus. Will continue to monitor pt closely. MD confirmed to stop levophed at this time.

## 2014-09-19 NOTE — Progress Notes (Signed)
Assessment/Plan 1. Embolic CVA- Neurology seeing 2. Nstemi / Hx CAD w occluded RCA and stents to LAD/LCx- per Card 3. ESRD - HD TTS  4. HTN/vol, now low BPs - on clon/ MTP at home.  5. A. Fib -per Dr. Terrence Dupont  6. MSSA bacteremia  Plan- For dialysis tomorrow if BP allows  Subjective: Wants to go home, coughs a lot  Objective: Vital signs in last 24 hours: Temp:  [97.7 F (36.5 C)-100.3 F (37.9 C)] 98.1 F (36.7 C) (12/09 0800) Pulse Rate:  [59-94] 91 (12/09 0900) Resp:  [13-41] 22 (12/09 0900) BP: (81-131)/(34-68) 102/68 mmHg (12/09 0900) SpO2:  [81 %-100 %] 98 % (12/09 0900) Arterial Line BP: (99-147)/(39-67) 130/54 mmHg (12/09 0800) Weight change:   Intake/Output from previous day: 12/08 0701 - 12/09 0700 In: 1064.2 [P.O.:480; I.V.:534.2; IV Piggyback:50] Out: 0  Intake/Output this shift: Total I/O In: 9.1 [I.V.:9.1] Out: -   General appearance: alert, confused Chest wall: no tenderness Cardio: irregularly irregular rhythm Extremities: extremities normal, atraumatic, no cyanosis or edema  Lab Results:  Recent Labs  09/17/14 0250 09/18/14 0335  WBC 19.0* 19.0*  HGB 12.1* 11.1*  HCT 37.1* 32.8*  PLT 121* 105*   BMET:  Recent Labs  09/17/14 0250 09/18/14 0335  NA 138 139  K 4.0 3.5*  CL 95* 100  CO2 25 19  GLUCOSE 211* 219*  BUN 37* 52*  CREATININE 3.90* 6.88*  CALCIUM 8.5 8.6   No results for input(s): PTH in the last 72 hours. Iron Studies: No results for input(s): IRON, TIBC, TRANSFERRIN, FERRITIN in the last 72 hours. Studies/Results: Mr Herby Abraham Contrast  09/17/2014   CLINICAL DATA:  79 year old male with left side weakness, confusion discovered a dialysis treatment this morning. Initial encounter.  EXAM: MRI HEAD WITHOUT CONTRAST  MRA HEAD WITHOUT CONTRAST  TECHNIQUE: Multiplanar, multiecho pulse sequences of the brain and surrounding structures were obtained without intravenous contrast. Angiographic images of the head were obtained  using MRA technique without contrast.  COMPARISON:  Head CT without contrast 09/20/2014 and earlier. Cervical spine MRI 03/16/2012.  FINDINGS: MRI HEAD FINDINGS  The examination had to be discontinued prior to completion due to patient confusion, agitation.  Multiple small bilateral foci of restricted diffusion, including in both cerebellar hemispheres and both cerebral hemispheres. Anterior and posterior circulation territory affected. The largest 2 areas of involvement are in the subcortical white matter of the right peri-Rolandic cortex (10 mm focus), and the left basal ganglia (12 mm focus).  Major intracranial vascular flow voids are preserved. No acute intracranial hemorrhage identified. No midline shift, mass effect, or evidence of intracranial mass lesion. Mild T2 and FLAIR the larger acute sites. Underlying chronic Cortical encephalomalacia in the right parietal lobe, and chronic lacunar infarcts in the thalami.  No midline shift. No ventriculomegaly. Patent basilar cisterns. Visualized orbit soft tissues are within normal limits. Visualized paranasal sinuses and mastoids are clear.  MRA HEAD FINDINGS  Mildly degraded by motion artifact.  Antegrade flow in the posterior circulation with codominant distal vertebral arteries. Patent vertebrobasilar junction. Patent right PICA origin. Basilar artery irregularity but no stenosis. SCA and PCA origins are within normal limits. Detail of the PCA branches is degraded by motion. Posterior communicating arteries are diminutive or absent.  Antegrade flow in both ICA siphons. Siphon irregularity compatible with atherosclerosis but no siphon stenosis. Patent carotid termini. Patent MCA and ACA origins. MCA and ACA branch detail degraded by motion. No proximal MCA, ACA, or PCA occlusion identified.  IMPRESSION: 1. Multiple scattered small (lacunar type) acute infarcts and both cerebral and cerebellar hemispheres. Pattern suggestive of recent embolic event from the heart  or proximal aorta. No associated mass effect or hemorrhage. 2. No circle of Willis large vessel occlusion or stenosis identified. 3. MRI discontinued prior to completion due to patient confusion.   Electronically Signed   By: Lars Pinks M.D.   On: 09/17/2014 18:33   Dg Chest Port 1 View  09/18/2014   CLINICAL DATA:  Central line placement.  EXAM: PORTABLE CHEST - 1 VIEW  COMPARISON:  09/17/2014  FINDINGS: Left central venous catheter has been inserted. Tip overlies the low SVC region. No pneumothorax. Shallow inspiration. Borderline heart size with normal pulmonary vascularity. Calcified and tortuous aorta.  IMPRESSION: Left central venous catheter tip over the low SVC region. No pneumothorax.   Electronically Signed   By: Lucienne Capers M.D.   On: 09/18/2014 22:49   Dg Chest Port 1 View  09/17/2014   CLINICAL DATA:  New central line placement  EXAM: PORTABLE CHEST - 1 VIEW  COMPARISON:  Twelve 5,015  FINDINGS: There is an external pad artifact over the mediastinum. Interval placement of a right central venous line with tip in the distal SVC. No pneumothorax. Low lung volumes and cardiomegaly  IMPRESSION: Placement of right central venous line without complication.   Electronically Signed   By: Suzy Bouchard M.D.   On: 09/17/2014 13:58   Mr Jodene Nam Head/brain Wo Cm  09/17/2014   CLINICAL DATA:  79 year old male with left side weakness, confusion discovered a dialysis treatment this morning. Initial encounter.  EXAM: MRI HEAD WITHOUT CONTRAST  MRA HEAD WITHOUT CONTRAST  TECHNIQUE: Multiplanar, multiecho pulse sequences of the brain and surrounding structures were obtained without intravenous contrast. Angiographic images of the head were obtained using MRA technique without contrast.  COMPARISON:  Head CT without contrast 10/09/2014 and earlier. Cervical spine MRI 03/16/2012.  FINDINGS: MRI HEAD FINDINGS  The examination had to be discontinued prior to completion due to patient confusion, agitation.   Multiple small bilateral foci of restricted diffusion, including in both cerebellar hemispheres and both cerebral hemispheres. Anterior and posterior circulation territory affected. The largest 2 areas of involvement are in the subcortical white matter of the right peri-Rolandic cortex (10 mm focus), and the left basal ganglia (12 mm focus).  Major intracranial vascular flow voids are preserved. No acute intracranial hemorrhage identified. No midline shift, mass effect, or evidence of intracranial mass lesion. Mild T2 and FLAIR the larger acute sites. Underlying chronic Cortical encephalomalacia in the right parietal lobe, and chronic lacunar infarcts in the thalami.  No midline shift. No ventriculomegaly. Patent basilar cisterns. Visualized orbit soft tissues are within normal limits. Visualized paranasal sinuses and mastoids are clear.  MRA HEAD FINDINGS  Mildly degraded by motion artifact.  Antegrade flow in the posterior circulation with codominant distal vertebral arteries. Patent vertebrobasilar junction. Patent right PICA origin. Basilar artery irregularity but no stenosis. SCA and PCA origins are within normal limits. Detail of the PCA branches is degraded by motion. Posterior communicating arteries are diminutive or absent.  Antegrade flow in both ICA siphons. Siphon irregularity compatible with atherosclerosis but no siphon stenosis. Patent carotid termini. Patent MCA and ACA origins. MCA and ACA branch detail degraded by motion. No proximal MCA, ACA, or PCA occlusion identified.  IMPRESSION: 1. Multiple scattered small (lacunar type) acute infarcts and both cerebral and cerebellar hemispheres. Pattern suggestive of recent embolic event from the heart or proximal  aorta. No associated mass effect or hemorrhage. 2. No circle of Willis large vessel occlusion or stenosis identified. 3. MRI discontinued prior to completion due to patient confusion.   Electronically Signed   By: Lars Pinks M.D.   On: 09/17/2014  18:33   Scheduled: . atorvastatin  40 mg Oral q1800  . calcium acetate  667 mg Oral TID WC  .  ceFAZolin (ANCEF) IV  2 g Intravenous Q T,Th,Sa-HD  . digoxin  0.25 mg Intravenous Daily  . feeding supplement (NEPRO CARB STEADY)  237 mL Oral BID BM  . insulin aspart  0-9 Units Subcutaneous TID WC  . metoprolol tartrate  12.5 mg Oral BID  . midazolam  1 mg Intravenous Once  . multivitamin  1 tablet Oral QHS  . pantoprazole  40 mg Oral Daily  . sodium chloride  10-40 mL Intracatheter Q12H  . ticagrelor  60 mg Oral BID     LOS: 4 days   Merridith Dershem C 09/19/2014,9:18 AM

## 2014-09-19 NOTE — Progress Notes (Signed)
Ref: [name], MD   Subjective:  Feeling better. Confusion persist. T max 100.3 F  Objective:  Vital Signs in the last 24 hours: Temp:  [97.7 F (36.5 C)-100.3 F (37.9 C)] 98.1 F (36.7 C) (12/09 0800) Pulse Rate:  [53-94] 94 (12/09 0755) Cardiac Rhythm:  [-] Atrial fibrillation (12/09 0400) Resp:  [13-41] 31 (12/09 0755) BP: (81-131)/(34-65) 113/61 mmHg (12/09 0755) SpO2:  [81 %-100 %] 100 % (12/09 0755) Arterial Line BP: (99-147)/(39-67) 147/54 mmHg (12/09 0755)  Physical Exam: BP Readings from Last 1 Encounters:  09/19/14 113/61    Wt Readings from Last 1 Encounters:  09/18/14 87.7 kg (193 lb 5.5 oz)    Weight change:   HEENT: [place]/AT, Eyes-Brown, PERL, EOMI, Conjunctiva-Pink, Sclera-Non-icteric Neck: No JVD, No bruit, Trachea midline. Lungs:  Clear, Bilateral. Cardiac:  Regular rhythm, normal S1 and S2, no S3. III/VI systolic murmur. Abdomen:  Soft, non-tender. Extremities:  No edema present. No cyanosis. No clubbing.Left arm AVF. Left Internal Jugular subclavian line. Bilateral great toe amputation. CNS: AxOx2, Cranial nerves grossly intact, moves all 4 extremities. Right handed. Skin: Warm and dry.   Intake/Output from previous day: 12/08 0701 - 12/09 0700 In: 1064.2 [P.O.:480; I.V.:534.2; IV Piggyback:50] Out: 0     Lab Results: BMET    Component Value Date/Time   NA 139 09/18/2014 0335   NA 138 09/17/2014 0250   NA 136* 09/17/2014 0030   K 3.5* 09/18/2014 0335   K 4.0 09/17/2014 0250   K 3.8 09/17/2014 0030   CL 100 09/18/2014 0335   CL 95* 09/17/2014 0250   CL 94* 09/17/2014 0030   CO2 19 09/18/2014 0335   CO2 25 09/17/2014 0250   CO2 20 09/17/2014 0030   GLUCOSE 219* 09/18/2014 0335   GLUCOSE 211* 09/17/2014 0250   GLUCOSE 202* 09/17/2014 0030   BUN 52* 09/18/2014 0335   BUN 37* 09/17/2014 0250   BUN 31* 09/17/2014 0030   CREATININE 6.88* 09/18/2014 0335   CREATININE 3.90* 09/17/2014 0250   CREATININE 3.52* 09/17/2014 0030   CALCIUM  8.6 09/18/2014 0335   CALCIUM 8.5 09/17/2014 0250   CALCIUM 8.4 09/17/2014 0030   CALCIUM 7.8* 12/07/2009 0558   CALCIUM 8.7 10/25/2009 1301   CALCIUM 8.5 05/29/2009 0936   GFRNONAA 7* 09/18/2014 0335   GFRNONAA 14* 09/17/2014 0250   GFRNONAA 15* 09/17/2014 0030   GFRAA 8* 09/18/2014 0335   GFRAA 16* 09/17/2014 0250   GFRAA 18* 09/17/2014 0030   CBC    Component Value Date/Time   WBC 19.0* 09/18/2014 0335   RBC 3.77* 09/18/2014 0335   RBC 4.05* 10/21/2013 2230   HGB 11.1* 09/18/2014 0335   HCT 32.8* 09/18/2014 0335   PLT 105* 09/18/2014 0335   MCV 87.0 09/18/2014 0335   MCH 29.4 09/18/2014 0335   MCHC 33.8 09/18/2014 0335   RDW 19.9* 09/18/2014 0335   LYMPHSABS 0.3* 09/16/2014 2205   MONOABS 1.6* 09/16/2014 2205   EOSABS 0.0 09/16/2014 2205   BASOSABS 0.0 09/16/2014 2205   HEPATIC Function Panel  Recent Labs  10/21/13 1644 10/06/2014 0756 09/17/14 0030  PROT 5.4* 6.9 5.8*   HEMOGLOBIN A1C No components found for: HGA1C,  MPG CARDIAC ENZYMES Lab Results  Component Value Date   CKTOTAL 403* 12/09/2009   CKMB 4.9* 12/09/2009   TROPONINI 3.10* 09/17/2014   TROPONINI 2.59* 09/16/2014   TROPONINI 2.37* 09/16/2014   BNP No results for input(s): PROBNP in the last 8760 hours. TSH  Recent Labs  10/23/13 1135  09/24/2014 2257  TSH 1.730 1.100   CHOLESTEROL  Recent Labs  09/16/14 0527  CHOL 79    Scheduled Meds: . aspirin EC  81 mg Oral Daily  . atorvastatin  40 mg Oral q1800  . calcium acetate  667 mg Oral TID WC  .  ceFAZolin (ANCEF) IV  2 g Intravenous Q T,Th,Sa-HD  . digoxin  0.25 mg Intravenous Daily  . feeding supplement (NEPRO CARB STEADY)  237 mL Oral BID BM  . insulin aspart  0-9 Units Subcutaneous TID WC  . metoprolol tartrate  12.5 mg Oral BID  . midazolam  1 mg Intravenous Once  . multivitamin  1 tablet Oral QHS  . pantoprazole  40 mg Oral Daily  . sodium chloride  10-40 mL Intracatheter Q12H  . ticagrelor  90 mg Oral BID   Continuous  Infusions: . sodium chloride 10 mL/hr at 09/18/14 0800  . DOPamine Stopped (09/17/14 0740)  . norepinephrine (LEVOPHED) Adult infusion 9 mcg/min (09/19/14 0740)   PRN Meds:.sodium chloride, sodium chloride, Place/Maintain arterial line **AND** sodium chloride, acetaminophen, ALPRAZolam, feeding supplement (NEPRO CARB STEADY), heparin, heparin, lidocaine (PF), lidocaine-prilocaine, loperamide, nitroGLYCERIN, ondansetron (ZOFRAN) IV, ondansetron, pentafluoroprop-tetrafluoroeth, senna-docusate, sodium chloride  Assessment/Plan: Small non-Q-wave myocardial infarction with atypical presentation Confusion/mild left-sided weakness  Cardioembolic CVA Hypotensive shock multifactorial i.e. hypovolemia post dialysis/rule out sepsis Atrial fibrillation with controlled ventricular response CAD history of MI 2 in the past status post PCI to LAD and left circumflex in January 2015 Hypertension Diabetes mellitus, II Moderate aortic stenosis Peripheral vascular disease End-stage renal disease on hemodialysis Hypercholesteremia Anemia of chronic disease Tobacco abuse Marked leukocytosis probably secondary to steroids rule out sepsis History of gouty arthritis History of CA of prostate  Start coumadin Stop aspirin and Brilinta     LOS: 4 days    Dixie Dials  MD  09/19/2014, 8:50 AM

## 2014-09-19 NOTE — Progress Notes (Signed)
INFECTIOUS DISEASE PROGRESS NOTE  ID: Brad Mcintosh is a 79 y.o. male with  Active Problems:   Acute coronary syndrome   Central apnea   Confusion   CVA (cerebral infarction)   Shock  Subjective: "i feel good. I'm going home"  Abtx:  Anti-infectives    Start     Dose/Rate Route Frequency Ordered Stop   09/18/14 1200  vancomycin (VANCOCIN) IVPB 750 mg/150 ml premix  Status:  Discontinued     750 mg150 mL/hr over 60 Minutes Intravenous Every T-Th-Sa (Hemodialysis) 09/26/2014 2056 09/17/14 1142   09/18/14 1200  ceFAZolin (ANCEF) IVPB 2 g/50 mL premix     2 g100 mL/hr over 30 Minutes Intravenous Every T-Th-Sa (Hemodialysis) 09/17/14 1203     09/17/14 1300  ceFAZolin (ANCEF) IVPB 2 g/50 mL premix     2 g100 mL/hr over 30 Minutes Intravenous  Once 09/17/14 1203 09/17/14 1500   10/05/2014 2200  piperacillin-tazobactam (ZOSYN) IVPB 2.25 g  Status:  Discontinued     2.25 g100 mL/hr over 30 Minutes Intravenous 3 times per day 09/16/2014 2053 09/17/14 1142   09/21/2014 2100  vancomycin (VANCOCIN) 1,500 mg in sodium chloride 0.9 % 500 mL IVPB     1,500 mg250 mL/hr over 120 Minutes Intravenous  Once 09/30/2014 2053 09/16/14 0001      Medications:  Scheduled: . aspirin EC  81 mg Oral Daily  . atorvastatin  40 mg Oral q1800  . calcium acetate  667 mg Oral TID WC  .  ceFAZolin (ANCEF) IV  2 g Intravenous Q T,Th,Sa-HD  . feeding supplement (NEPRO CARB STEADY)  237 mL Oral BID BM  . insulin aspart  0-9 Units Subcutaneous TID WC  . metoprolol tartrate  12.5 mg Oral BID  . midazolam  1 mg Intravenous Once  . multivitamin  1 tablet Oral QHS  . pantoprazole  40 mg Oral Daily  . sodium chloride  10-40 mL Intracatheter Q12H  . ticagrelor  90 mg Oral BID    Objective: Vital signs in last 24 hours: Temp:  [97.7 F (36.5 C)-100.3 F (37.9 C)] 98.1 F (36.7 C) (12/09 0800) Pulse Rate:  [53-94] 94 (12/09 0755) Resp:  [13-41] 31 (12/09 0755) BP: (81-131)/(34-65) 113/61 mmHg (12/09 0755) SpO2:  [81  %-100 %] 100 % (12/09 0755) Arterial Line BP: (99-147)/(39-67) 147/54 mmHg (12/09 0755)   General appearance: alert, no distress and confused Neck: L IJ is clean, nontender Resp: clear to auscultation bilaterally Cardio: regular rate and rhythm GI: normal findings: bowel sounds normal and soft, non-tender Extremities: RUE AVG is clean  Lab Results  Recent Labs  09/17/14 0250 09/18/14 0335  WBC 19.0* 19.0*  HGB 12.1* 11.1*  HCT 37.1* 32.8*  NA 138 139  K 4.0 3.5*  CL 95* 100  CO2 25 19  BUN 37* 52*  CREATININE 3.90* 6.88*   Liver Panel  Recent Labs  09/17/14 0030  PROT 5.8*  ALBUMIN 2.2*  AST 30  ALT 24  ALKPHOS 60  BILITOT 2.3*   Sedimentation Rate No results for input(s): ESRSEDRATE in the last 72 hours. C-Reactive Protein No results for input(s): CRP in the last 72 hours.  Microbiology: Recent Results (from the past 240 hour(s))  MRSA PCR Screening     Status: None   Collection Time: 09/11/2014  6:35 PM  Result Value Ref Range Status   MRSA by PCR NEGATIVE NEGATIVE Final    Comment:        The GeneXpert MRSA  Assay (FDA approved for NASAL specimens only), is one component of a comprehensive MRSA colonization surveillance program. It is not intended to diagnose MRSA infection nor to guide or monitor treatment for MRSA infections.   Culture, blood (routine x 2)     Status: None   Collection Time: 10/10/2014  9:03 PM  Result Value Ref Range Status   Specimen Description BLOOD LEFT FOREARM  Final   Special Requests   Final    BOTTLES DRAWN AEROBIC AND ANAEROBIC 10CC AEROBIC [place] ANAEROBIC   Culture  Setup Time   Final    09/16/2014 03:54 Performed at [facility]    Culture   Final    STAPHYLOCOCCUS AUREUS Note: RIFAMPIN AND GENTAMICIN SHOULD NOT BE USED AS SINGLE DRUGS FOR TREATMENT OF STAPH INFECTIONS. Note: Gram Stain Report Called to,Read Back By and Verified With: NICKY BEASLEY 09/16/14 @ 1:39PM BY RUSCOE A. Performed at [facility]    Report Status 09/18/2014 FINAL  Final   Organism ID, Bacteria STAPHYLOCOCCUS AUREUS  Final      Susceptibility   Staphylococcus aureus - MIC*    CLINDAMYCIN <=0.25 SENSITIVE Sensitive     ERYTHROMYCIN <=0.25 SENSITIVE Sensitive     GENTAMICIN <=0.5 SENSITIVE Sensitive     LEVOFLOXACIN <=0.12 SENSITIVE Sensitive     OXACILLIN 0.5 SENSITIVE Sensitive     PENICILLIN 0.12 SENSITIVE Sensitive     RIFAMPIN <=0.5 SENSITIVE Sensitive     TRIMETH/SULFA <=10 SENSITIVE Sensitive     VANCOMYCIN <=0.5 SENSITIVE Sensitive     TETRACYCLINE <=1 SENSITIVE Sensitive     MOXIFLOXACIN <=0.25 SENSITIVE Sensitive     * STAPHYLOCOCCUS AUREUS  Culture, blood (routine x 2)     Status: None   Collection Time: 09/26/2014  9:03 PM  Result Value Ref Range Status   Specimen Description BLOOD LEFT FOREARM  Final   Special Requests BOTTLES DRAWN AEROBIC AND ANAEROBIC 5CC EACH  Final   Culture  Setup Time   Final    09/16/2014 03:54 Performed at [facility]    Culture   Final    STAPHYLOCOCCUS AUREUS Note: SUSCEPTIBILITIES PERFORMED ON PREVIOUS CULTURE WITHIN THE LAST 5 DAYS. Note: Gram Stain Report Called to,Read Back By and Verified With: NICKY BEASLEY 09/16/14 @ 1:40PM BY RUSCOE A. Performed at [facility]    Report Status 09/18/2014 FINAL  Final  Culture, blood (routine x 2)     Status: None (Preliminary result)   Collection Time: 09/17/14  1:00 PM  Result Value Ref Range Status   Specimen Description BLOOD LEFT ARM  Final   Special Requests BOTTLES DRAWN AEROBIC ONLY 1CC  Final   Culture  Setup Time   Final    09/17/2014 18:24 Performed at [facility]    Culture   Final           BLOOD CULTURE RECEIVED NO GROWTH TO DATE CULTURE WILL BE HELD FOR 5 DAYS BEFORE ISSUING A FINAL NEGATIVE REPORT Performed at [facility]    Report Status PENDING  Incomplete  Clostridium Difficile by PCR     Status: None   Collection Time: 09/18/14  8:15 AM  Result Value Ref  Range Status   C difficile by pcr NEGATIVE NEGATIVE Final    Studies/Results: Mr Brain Wo Contrast  09/17/2014   CLINICAL DATA:  79 year old male with left side weakness, confusion discovered a dialysis treatment this morning. Initial encounter.  EXAM: MRI HEAD WITHOUT CONTRAST  MRA HEAD WITHOUT  CONTRAST  TECHNIQUE: Multiplanar, multiecho pulse sequences of the brain and surrounding structures were obtained without intravenous contrast. Angiographic images of the head were obtained using MRA technique without contrast.  COMPARISON:  Head CT without contrast 09/24/2014 and earlier. Cervical spine MRI 03/16/2012.  FINDINGS: MRI HEAD FINDINGS  The examination had to be discontinued prior to completion due to patient confusion, agitation.  Multiple small bilateral foci of restricted diffusion, including in both cerebellar hemispheres and both cerebral hemispheres. Anterior and posterior circulation territory affected. The largest 2 areas of involvement are in the subcortical white matter of the right peri-Rolandic cortex (10 mm focus), and the left basal ganglia (12 mm focus).  Major intracranial vascular flow voids are preserved. No acute intracranial hemorrhage identified. No midline shift, mass effect, or evidence of intracranial mass lesion. Mild T2 and FLAIR the larger acute sites. Underlying chronic Cortical encephalomalacia in the right parietal lobe, and chronic lacunar infarcts in the thalami.  No midline shift. No ventriculomegaly. Patent basilar cisterns. Visualized orbit soft tissues are within normal limits. Visualized paranasal sinuses and mastoids are clear.  MRA HEAD FINDINGS  Mildly degraded by motion artifact.  Antegrade flow in the posterior circulation with codominant distal vertebral arteries. Patent vertebrobasilar junction. Patent right PICA origin. Basilar artery irregularity but no stenosis. SCA and PCA origins are within normal limits. Detail of the PCA branches is degraded by motion.  Posterior communicating arteries are diminutive or absent.  Antegrade flow in both ICA siphons. Siphon irregularity compatible with atherosclerosis but no siphon stenosis. Patent carotid termini. Patent MCA and ACA origins. MCA and ACA branch detail degraded by motion. No proximal MCA, ACA, or PCA occlusion identified.  IMPRESSION: 1. Multiple scattered small (lacunar type) acute infarcts and both cerebral and cerebellar hemispheres. Pattern suggestive of recent embolic event from the heart or proximal aorta. No associated mass effect or hemorrhage. 2. No circle of Willis large vessel occlusion or stenosis identified. 3. MRI discontinued prior to completion due to patient confusion.   Electronically Signed   By: Lars Pinks M.D.   On: 09/17/2014 18:33   Dg Chest Port 1 View  09/18/2014   CLINICAL DATA:  Central line placement.  EXAM: PORTABLE CHEST - 1 VIEW  COMPARISON:  09/17/2014  FINDINGS: Left central venous catheter has been inserted. Tip overlies the low SVC region. No pneumothorax. Shallow inspiration. Borderline heart size with normal pulmonary vascularity. Calcified and tortuous aorta.  IMPRESSION: Left central venous catheter tip over the low SVC region. No pneumothorax.   Electronically Signed   By: Lucienne Capers M.D.   On: 09/18/2014 22:49   Dg Chest Port 1 View  09/17/2014   CLINICAL DATA:  New central line placement  EXAM: PORTABLE CHEST - 1 VIEW  COMPARISON:  Twelve 5,015  FINDINGS: There is an external pad artifact over the mediastinum. Interval placement of a right central venous line with tip in the distal SVC. No pneumothorax. Low lung volumes and cardiomegaly  IMPRESSION: Placement of right central venous line without complication.   Electronically Signed   By: Suzy Bouchard M.D.   On: 09/17/2014 13:58   Mr Jodene Nam Head/brain Wo Cm  09/17/2014   CLINICAL DATA:  79 year old male with left side weakness, confusion discovered a dialysis treatment this morning. Initial encounter.  EXAM: MRI  HEAD WITHOUT CONTRAST  MRA HEAD WITHOUT CONTRAST  TECHNIQUE: Multiplanar, multiecho pulse sequences of the brain and surrounding structures were obtained without intravenous contrast. Angiographic images of the head were obtained using  MRA technique without contrast.  COMPARISON:  Head CT without contrast 10/07/2014 and earlier. Cervical spine MRI 03/16/2012.  FINDINGS: MRI HEAD FINDINGS  The examination had to be discontinued prior to completion due to patient confusion, agitation.  Multiple small bilateral foci of restricted diffusion, including in both cerebellar hemispheres and both cerebral hemispheres. Anterior and posterior circulation territory affected. The largest 2 areas of involvement are in the subcortical white matter of the right peri-Rolandic cortex (10 mm focus), and the left basal ganglia (12 mm focus).  Major intracranial vascular flow voids are preserved. No acute intracranial hemorrhage identified. No midline shift, mass effect, or evidence of intracranial mass lesion. Mild T2 and FLAIR the larger acute sites. Underlying chronic Cortical encephalomalacia in the right parietal lobe, and chronic lacunar infarcts in the thalami.  No midline shift. No ventriculomegaly. Patent basilar cisterns. Visualized orbit soft tissues are within normal limits. Visualized paranasal sinuses and mastoids are clear.  MRA HEAD FINDINGS  Mildly degraded by motion artifact.  Antegrade flow in the posterior circulation with codominant distal vertebral arteries. Patent vertebrobasilar junction. Patent right PICA origin. Basilar artery irregularity but no stenosis. SCA and PCA origins are within normal limits. Detail of the PCA branches is degraded by motion. Posterior communicating arteries are diminutive or absent.  Antegrade flow in both ICA siphons. Siphon irregularity compatible with atherosclerosis but no siphon stenosis. Patent carotid termini. Patent MCA and ACA origins. MCA and ACA branch detail degraded by  motion. No proximal MCA, ACA, or PCA occlusion identified.  IMPRESSION: 1. Multiple scattered small (lacunar type) acute infarcts and both cerebral and cerebellar hemispheres. Pattern suggestive of recent embolic event from the heart or proximal aorta. No associated mass effect or hemorrhage. 2. No circle of Willis large vessel occlusion or stenosis identified. 3. MRI discontinued prior to completion due to patient confusion.   Electronically Signed   By: Lars Pinks M.D.   On: 09/17/2014 18:33     Assessment/Plan: Staph bacteremia/ MSSA CVA CAD Non-Q MI Afib DM2 As ESRD Diarrhea- C diff (-)  Total days of antibiotics: 5 ancef Remains on pressors with low BP.  Await tee.  No change in anbx for now.  Not clear what to expect from his mental status, if it will improve          Bobby Rumpf Infectious Diseases (pager) [phone] [email or web address] 09/19/2014, 8:19 AM  LOS: 4 days

## 2014-09-19 NOTE — Progress Notes (Signed)
Speech Language Pathology Treatment: Dysphagia  Patient Details Name: Brad Mcintosh MRN: [id number] DOB: 1935/09/15 Today's Date: 09/19/2014 Time: 0945-1000 SLP Time Calculation (min) (ACUTE ONLY): 15 min  Assessment / Plan / Recommendation Clinical Impression  RN reports concern pt is not tolerating nectar thick liquids. They report his coughing has significantly increased since Monday and he often coughs in conjunction with straw sips of nectar. SLP observed pt to have strong cough prior to PO intake, but again coughing following trials of nectar thick teaspoon and cup. Given concern and inability to identify aspiration due to frequent coughing recommend FEES to objectively evaluate swallow function. Instucted RN to hold liquids until exam, though pt may continue purees and puddings and take meds with these.    HPI HPI: 79 y.o. male with history of A fib, hypertension, ESRD on HD presenting with left sided weakness and confusion found to have an elevated troponin (2.33) in the ED. Admitted for NSTEMI.  Transferred to CCU 12/5 because of hypotension.  MRI pending.     Pertinent Vitals Pain Assessment: Faces Faces Pain Scale: Hurts little more  SLP Plan  Other (Comment) (FEES)    Recommendations Diet recommendations: Dysphagia 1 (puree) (Asiked Rn to hold liquids until MBS complete) Medication Administration: Whole meds with puree Supervision: Full supervision/cueing for compensatory strategies Postural Changes and/or Swallow Maneuvers: Seated upright 90 degrees;Upright 30-60 min after meal              Plan: Other (Comment) (FEES)    GO    Herbie [place], MA CCC-SLP [phone]  [name] 09/19/2014, 10:47 AM

## 2014-09-19 NOTE — Evaluation (Signed)
Occupational Therapy Evaluation Patient Details Name: Brad Mcintosh MRN: [id number] DOB: Feb 11, 1935 Today's Date: 09/19/2014    History of Present Illness Pt adm with lt sided weakness and slurred speech.  Multiple scattered small (lacunar type) acute infarcts in the cerebrum and cerebellum. PMH - ESRD on HD, CAD, PVD, HTN, toe amputations   Clinical Impression   Pt admitted with above. He demonstrates the below listed deficits and will benefit from continued OT to maximize safety and independence with BADLs.  Pt with limited ability to participate.  He was able to state his name only.  He followed 1 one step command to lift Rt. UE, but followed no other commands.  Attempted to engage him in simple grooming tasks, but pt made no attempt to assist with hand over hand assist provided.   Currently, he requires total A with all BADLs. Recommend SNF at discharge.       Follow Up Recommendations  SNF    Equipment Recommendations  None recommended by OT    Recommendations for Other Services       Precautions / Restrictions Precautions Precautions: Fall Restrictions Weight Bearing Restrictions: No      Mobility Bed Mobility Overal bed mobility: Needs Assistance Bed Mobility: Rolling Rolling: +2 for physical assistance;Total assist   Supine to sit: +2 for physical assistance;Total assist Sit to supine: +2 for physical assistance;Total assist   General bed mobility comments: Pt spontaneously rolled to Lt. with supervision, but would not assist with bed mobility upon therapist's request and then resisted all attempts to move him   Transfers                 General transfer comment: unable to safely.  Pt resisted all attempts to move him     Balance Overall balance assessment: Needs assistance Sitting-balance support: Bilateral upper extremity supported;Feet supported Sitting balance-Leahy Scale: Zero Sitting balance - Comments: sat EOB x 10 mins with max A, pt with strong  posterior lean, especially when he was more alert Postural control: Posterior lean                                  ADL Overall ADL's : Needs assistance/impaired Eating/Feeding: Total assistance   Grooming: Wash/dry face;Total assistance Grooming Details (indicate cue type and reason): Pt would follow no commands to wash face.  With hand over hand assist provided, pt did not attempt to assist Upper Body Bathing: Total assistance;Bed level   Lower Body Bathing: Total assistance;Bed level   Upper Body Dressing : Total assistance;Bed level   Lower Body Dressing: Total assistance;Bed level   Toilet Transfer: Total assistance (unable)   Toileting- Clothing Manipulation and Hygiene: Total assistance;Bed level   Tub/ Shower Transfer: Total assistance (unable)   Functional mobility during ADLs: Total assistance       Vision                 Additional Comments: unable to assess due to cognitive deficits.  he will open eyes spontaneously, but will follow no commands   Perception     Praxis      Pertinent Vitals/Pain Pain Assessment: Faces Faces Pain Scale: No hurt Pain Location: pulling away and grimacing with touch alost anywhere on body and mentioned "the gout" several times.  Pain Intervention(s): Monitored during session;Repositioned;Limited activity within patient's tolerance     Hand Dominance     Extremity/Trunk Assessment Upper Extremity Assessment Upper  Extremity Assessment: RUE deficits/detail;LUE deficits/detail;Difficult to assess due to impaired cognition RUE Deficits / Details: Active movement noted, but reaching for bedrail, but unable to fully assess due to cognitive deficits LUE Deficits / Details: Pt noted with active movement Lt. UE, but did not use purposefully    Lower Extremity Assessment Lower Extremity Assessment: Defer to PT evaluation       Communication Communication Communication: Expressive difficulties (mumbled  speech)   Cognition Arousal/Alertness: Lethargic Behavior During Therapy: Restless Overall Cognitive Status: Impaired/Different from baseline Area of Impairment: Problem solving;Memory;Orientation;Following commands;Attention Orientation Level: Disoriented to;Place;Time;Situation Current Attention Level: Focused Memory: Decreased short-term memory Following Commands: Follows one step commands inconsistently     Problem Solving: Slow processing;Requires verbal cues;Requires tactile cues;Decreased initiation General Comments: Pt only followed 1 one step motor command.  Pt abel to state his name, but could not/would not answer further questions until end of eval when therapist asked him if he would like me to leave.  He responded "yes"   General Comments       Exercises       Shoulder Instructions      Home Living Family/patient expects to be discharged to:: Unsure                                 Additional Comments: Pt unable to provide info, and no family present      Prior Functioning/Environment          Comments: Pt unable to provide info and no family present    OT Diagnosis: Generalized weakness;Cognitive deficits;Hemiplegia non-dominant side   OT Problem List: Decreased strength;Decreased range of motion;Decreased activity tolerance;Impaired balance (sitting and/or standing);Impaired vision/perception;Decreased coordination;Decreased cognition;Decreased safety awareness;Decreased knowledge of use of DME or AE;Decreased knowledge of precautions;Cardiopulmonary status limiting activity;Impaired UE functional use   OT Treatment/Interventions: Self-care/ADL training;Neuromuscular education;DME and/or AE instruction;Therapeutic activities;Cognitive remediation/compensation;Visual/perceptual remediation/compensation;Patient/family education;Balance training    OT Goals(Current goals can be found in the care plan section) Acute Rehab OT Goals Patient Stated  Goal: did not state OT Goal Formulation: Patient unable to participate in goal setting Time For Goal Achievement: 10/03/14 Potential to Achieve Goals: Fair ADL Goals Pt Will Perform Grooming: with mod assist;sitting;bed level Additional ADL Goal #1: Pt will sit EOB x 10 mins with mod A in prep for BADLs.  Additional ADL Goal #2: Pt will follow one step commands to participate in simple grooming activities 50% of time   OT Frequency: Min 2X/week   Barriers to D/C: Decreased caregiver support          Co-evaluation              End of Session Nurse Communication: Mobility status  Activity Tolerance: Patient limited by lethargy Patient left: in bed;with call bell/phone within reach   Time: 0981-1914 OT Time Calculation (min): 11 min Charges:  OT General Charges $OT Visit: 1 Procedure OT Evaluation $Initial OT Evaluation Tier I: 1 Procedure G-Codes:    Trana Ressler M 2014/10/04, 2:06 PM

## 2014-09-19 NOTE — Progress Notes (Signed)
Ref: [name], MD   Subjective:  Awakens easily with voice command. Off pressure agents post fluid bolus.  Objective:  Vital Signs in the last 24 hours: Temp:  [97.7 F (36.5 C)-100.3 F (37.9 C)] 99 F (37.2 C) (12/09 1700) Pulse Rate:  [53-97] 53 (12/09 1700) Cardiac Rhythm:  [-] Atrial fibrillation (12/09 1200) Resp:  [15-41] 21 (12/09 1600) BP: (81-131)/(34-77) 108/52 mmHg (12/09 1700) SpO2:  [92 %-100 %] 99 % (12/09 1700) Arterial Line BP: (89-157)/(43-75) 127/59 mmHg (12/09 1800)  Physical Exam: BP Readings from Last 1 Encounters:  09/19/14 108/52    Wt Readings from Last 1 Encounters:  09/18/14 87.7 kg (193 lb 5.5 oz)    Weight change:   HEENT: [place]/AT, Eyes-Brown, PERL, EOMI, Conjunctiva-Pink, Sclera-Non-icteric Neck: No JVD, No bruit, Trachea midline. Lungs:  Clear, Bilateral. Cardiac:  Regular rhythm, normal S1 and S2, no S3.  Abdomen:  Soft, non-tender. Extremities:  No edema present. No cyanosis. No clubbing. Left arm AVF. Left Internal Jugular subclavian line. Bilateral great toe amputation. CNS: AxOx1, Cranial nerves grossly intact, moves all 4 extremities. Right handed. Skin: Warm and dry.   Intake/Output from previous day: 12/08 0701 - 12/09 0700 In: 1064.2 [P.O.:480; I.V.:534.2; IV Piggyback:50] Out: 0     Lab Results: BMET    Component Value Date/Time   NA 139 09/18/2014 0335   NA 138 09/17/2014 0250   NA 136* 09/17/2014 0030   K 3.5* 09/18/2014 0335   K 4.0 09/17/2014 0250   K 3.8 09/17/2014 0030   CL 100 09/18/2014 0335   CL 95* 09/17/2014 0250   CL 94* 09/17/2014 0030   CO2 19 09/18/2014 0335   CO2 25 09/17/2014 0250   CO2 20 09/17/2014 0030   GLUCOSE 219* 09/18/2014 0335   GLUCOSE 211* 09/17/2014 0250   GLUCOSE 202* 09/17/2014 0030   BUN 52* 09/18/2014 0335   BUN 37* 09/17/2014 0250   BUN 31* 09/17/2014 0030   CREATININE 6.88* 09/18/2014 0335   CREATININE 3.90* 09/17/2014 0250   CREATININE 3.52* 09/17/2014 0030   CALCIUM 8.6  09/18/2014 0335   CALCIUM 8.5 09/17/2014 0250   CALCIUM 8.4 09/17/2014 0030   CALCIUM 7.8* 12/07/2009 0558   CALCIUM 8.7 10/25/2009 1301   CALCIUM 8.5 05/29/2009 0936   GFRNONAA 7* 09/18/2014 0335   GFRNONAA 14* 09/17/2014 0250   GFRNONAA 15* 09/17/2014 0030   GFRAA 8* 09/18/2014 0335   GFRAA 16* 09/17/2014 0250   GFRAA 18* 09/17/2014 0030   CBC    Component Value Date/Time   WBC 19.0* 09/18/2014 0335   RBC 3.77* 09/18/2014 0335   RBC 4.05* 10/21/2013 2230   HGB 11.1* 09/18/2014 0335   HCT 32.8* 09/18/2014 0335   PLT 105* 09/18/2014 0335   MCV 87.0 09/18/2014 0335   MCH 29.4 09/18/2014 0335   MCHC 33.8 09/18/2014 0335   RDW 19.9* 09/18/2014 0335   LYMPHSABS 0.3* 09/16/2014 2205   MONOABS 1.6* 09/16/2014 2205   EOSABS 0.0 09/16/2014 2205   BASOSABS 0.0 09/16/2014 2205   HEPATIC Function Panel  Recent Labs  10/21/13 1644 10/09/2014 0756 09/17/14 0030  PROT 5.4* 6.9 5.8*   HEMOGLOBIN A1C No components found for: HGA1C,  MPG CARDIAC ENZYMES Lab Results  Component Value Date   CKTOTAL 403* 12/09/2009   CKMB 4.9* 12/09/2009   TROPONINI 3.10* 09/17/2014   TROPONINI 2.59* 09/16/2014   TROPONINI 2.37* 09/16/2014   BNP No results for input(s): PROBNP in the last 8760 hours. TSH  Recent Labs  10/23/13 1135 09/16/2014 2257  TSH 1.730 1.100   CHOLESTEROL  Recent Labs  09/16/14 0527  CHOL 79    Scheduled Meds: . sodium chloride   Intravenous Once  . atorvastatin  40 mg Oral q1800  . calcium acetate  667 mg Oral TID WC  .  ceFAZolin (ANCEF) IV  2 g Intravenous Q T,Th,Sa-HD  . digoxin  0.25 mg Intravenous Daily  . feeding supplement (NEPRO CARB STEADY)  237 mL Oral BID BM  . insulin aspart  0-9 Units Subcutaneous TID WC  . metoprolol tartrate  12.5 mg Oral BID  . midazolam  1 mg Intravenous Once  . multivitamin  1 tablet Oral QHS  . pantoprazole  40 mg Oral Daily  . sodium chloride  10-40 mL Intracatheter Q12H  . ticagrelor  60 mg Oral BID  . Warfarin -  Pharmacist Dosing Inpatient   Does not apply q1800   Continuous Infusions: . sodium chloride 10 mL/hr at 09/18/14 0800  . DOPamine Stopped (09/17/14 0740)  . norepinephrine (LEVOPHED) Adult infusion 3 mcg/min (09/19/14 1725)   PRN Meds:.sodium chloride, sodium chloride, Place/Maintain arterial line **AND** sodium chloride, acetaminophen, feeding supplement (NEPRO CARB STEADY), Gerhardt's butt cream, heparin, heparin, lidocaine (PF), lidocaine-prilocaine, loperamide, nitroGLYCERIN, ondansetron (ZOFRAN) IV, ondansetron, pentafluoroprop-tetrafluoroeth, senna-docusate, sodium chloride  Assessment/Plan: Small non-Q-wave myocardial infarction with atypical presentation Biventricular heart failure Confusion/mild left-sided weakness  Cardioembolic CVA Hypotensive shock multifactorial i.e. hypovolemia post dialysis/rule out sepsis Atrial fibrillation with controlled ventricular response CAD history of MI 2 in the past status post PCI to LAD and left circumflex in January 2015 Hypertension Diabetes mellitus, II Moderate aortic stenosis Peripheral vascular disease End-stage renal disease on hemodialysis Hypercholesteremia Anemia of chronic disease Tobacco abuse Marked leukocytosis probably secondary to steroids rule out sepsis History of gouty arthritis History of CA of prostate  Discussed care with son. Made him aware of multi-organ failure. Consider comfort care/DNR. Will try fluid bolus to decrease inotropic support. DC metoprolol.   LOS: 4 days    Dixie Dials  MD  09/19/2014, 6:19 PM

## 2014-09-19 NOTE — Progress Notes (Signed)
Attempted to titrate down on levophed. Titrated from 10 to 4 over the course of a couple hours. Noticed slowing of heart rate during titration from 80's down to 60's. Went back up on levo due to low BP. Will continue to monitor HR.

## 2014-09-20 ENCOUNTER — Encounter (HOSPITAL_COMMUNITY): Payer: Self-pay | Admitting: Vascular Surgery

## 2014-09-20 LAB — GLUCOSE, CAPILLARY
GLUCOSE-CAPILLARY: 177 mg/dL — AB (ref 70–99)
GLUCOSE-CAPILLARY: 172 mg/dL — AB (ref 70–99)
GLUCOSE-CAPILLARY: 124 mg/dL — AB (ref 70–99)
Glucose-Capillary: 153 mg/dL — ABNORMAL HIGH (ref 70–99)
Glucose-Capillary: 183 mg/dL — ABNORMAL HIGH (ref 70–99)
Glucose-Capillary: 162 mg/dL — ABNORMAL HIGH (ref 70–99)
Glucose-Capillary: 130 mg/dL — ABNORMAL HIGH (ref 70–99)
Glucose-Capillary: 98 mg/dL (ref 70–99)

## 2014-09-20 LAB — BASIC METABOLIC PANEL
ANION GAP: 19 — AB (ref 5–15)
BUN: 54 mg/dL — AB (ref 6–23)
CALCIUM: 9.1 mg/dL (ref 8.4–10.5)
CO2: 19 mEq/L (ref 19–32)
CREATININE: 6.35 mg/dL — AB (ref 0.50–1.35)
Chloride: 100 mEq/L (ref 96–112)
GFR calc Af Amer: 9 mL/min — ABNORMAL LOW (ref >90)
GFR, EST NON AFRICAN AMERICAN: 8 mL/min — AB (ref >90)
Glucose, Bld: 179 mg/dL — ABNORMAL HIGH (ref 70–99)
Potassium: 3.9 mEq/L (ref 3.7–5.3)
Sodium: 138 mEq/L (ref 137–147)

## 2014-09-20 LAB — PROTIME-INR
INR: 1.51 — ABNORMAL HIGH (ref 0.00–1.49)
Prothrombin Time: 18.4 seconds — ABNORMAL HIGH (ref 11.6–15.2)

## 2014-09-20 MED ORDER — CETYLPYRIDINIUM CHLORIDE 0.05 % MT LIQD
7.0000 mL | Freq: Two times a day (BID) | OROMUCOSAL | Status: DC
  Administered 2014-09-21 – 2014-09-27 (×9): 7 mL via OROMUCOSAL

## 2014-09-20 MED ORDER — CHLORHEXIDINE GLUCONATE 0.12 % MT SOLN
15.0000 mL | Freq: Two times a day (BID) | OROMUCOSAL | Status: DC
  Administered 2014-09-20 – 2014-09-27 (×13): 15 mL via OROMUCOSAL
  Filled 2014-09-20 (×17): qty 15

## 2014-09-20 MED ORDER — WARFARIN SODIUM 2 MG PO TABS
2.0000 mg | ORAL_TABLET | Freq: Once | ORAL | Status: AC
  Administered 2014-09-20: 2 mg via ORAL
  Filled 2014-09-20: qty 1

## 2014-09-20 NOTE — Progress Notes (Signed)
INFECTIOUS DISEASE PROGRESS NOTE  ID: Brad Mcintosh is a 79 y.o. male with  Active Problems:   Acute coronary syndrome   Central apnea   Confusion   CVA (cerebral infarction)   Shock  Subjective: Awake, without complaints  Abtx:  Anti-infectives    Start     Dose/Rate Route Frequency Ordered Stop   09/18/14 1200  vancomycin (VANCOCIN) IVPB 750 mg/150 ml premix  Status:  Discontinued     750 mg150 mL/hr over 60 Minutes Intravenous Every T-Th-Sa (Hemodialysis) 09/16/2014 2056 09/17/14 1142   09/18/14 1200  ceFAZolin (ANCEF) IVPB 2 g/50 mL premix     2 g100 mL/hr over 30 Minutes Intravenous Every T-Th-Sa (Hemodialysis) 09/17/14 1203     09/17/14 1300  ceFAZolin (ANCEF) IVPB 2 g/50 mL premix     2 g100 mL/hr over 30 Minutes Intravenous  Once 09/17/14 1203 09/17/14 1500   09/22/2014 2200  piperacillin-tazobactam (ZOSYN) IVPB 2.25 g  Status:  Discontinued     2.25 g100 mL/hr over 30 Minutes Intravenous 3 times per day 09/18/2014 2053 09/17/14 1142   10/08/2014 2100  vancomycin (VANCOCIN) 1,500 mg in sodium chloride 0.9 % 500 mL IVPB     1,500 mg250 mL/hr over 120 Minutes Intravenous  Once 09/19/2014 2053 09/16/14 0001      Medications:  Scheduled: . atorvastatin  40 mg Oral q1800  . calcium acetate  667 mg Oral TID WC  .  ceFAZolin (ANCEF) IV  2 g Intravenous Q T,Th,Sa-HD  . digoxin  0.25 mg Intravenous Daily  . feeding supplement (NEPRO CARB STEADY)  237 mL Oral BID BM  . insulin aspart  0-9 Units Subcutaneous TID WC  . midazolam  1 mg Intravenous Once  . multivitamin  1 tablet Oral QHS  . pantoprazole  40 mg Oral Daily  . sodium chloride  10-40 mL Intracatheter Q12H  . ticagrelor  60 mg Oral BID  . Warfarin - Pharmacist Dosing Inpatient   Does not apply q1800    Objective: Vital signs in last 24 hours: Temp:  [97.8 F (36.6 C)-99.5 F (37.5 C)] 97.8 F (36.6 C) (12/10 0700) Pulse Rate:  [32-99] 73 (12/10 0800) Resp:  [0-35] 17 (12/10 0800) BP: (89-147)/(38-77) 111/58 mmHg  (12/10 0800) SpO2:  [92 %-100 %] 95 % (12/10 0800) Arterial Line BP: (89-153)/(41-75) 153/52 mmHg (12/10 0800)   General appearance: alert, cooperative and no distress Neck: L IJ non-tender Resp: clear to auscultation bilaterally Cardio: regular rate and rhythm and systolic murmur: early systolic 3/6, crescendo at 2nd left intercostal space GI: normal findings: bowel sounds normal and soft, non-tender  Lab Results  Recent Labs  09/18/14 0335 09/20/14 0500  WBC 19.0*  --   HGB 11.1*  --   HCT 32.8*  --   NA 139 138  K 3.5* 3.9  CL 100 100  CO2 19 19  BUN 52* 54*  CREATININE 6.88* 6.35*   Liver Panel No results for input(s): PROT, ALBUMIN, AST, ALT, ALKPHOS, BILITOT, BILIDIR, IBILI in the last 72 hours. Sedimentation Rate No results for input(s): ESRSEDRATE in the last 72 hours. C-Reactive Protein No results for input(s): CRP in the last 72 hours.  Microbiology: Recent Results (from the past 240 hour(s))  MRSA PCR Screening     Status: None   Collection Time: 09/26/2014  6:35 PM  Result Value Ref Range Status   MRSA by PCR NEGATIVE NEGATIVE Final    Comment:        The  GeneXpert MRSA Assay (FDA approved for NASAL specimens only), is one component of a comprehensive MRSA colonization surveillance program. It is not intended to diagnose MRSA infection nor to guide or monitor treatment for MRSA infections.   Culture, blood (routine x 2)     Status: None   Collection Time: 09/12/2014  9:03 PM  Result Value Ref Range Status   Specimen Description BLOOD LEFT FOREARM  Final   Special Requests   Final    BOTTLES DRAWN AEROBIC AND ANAEROBIC 10CC AEROBIC [place] ANAEROBIC   Culture  Setup Time   Final    09/16/2014 03:54 Performed at [facility]    Culture   Final    STAPHYLOCOCCUS AUREUS Note: RIFAMPIN AND GENTAMICIN SHOULD NOT BE USED AS SINGLE DRUGS FOR TREATMENT OF STAPH INFECTIONS. Note: Gram Stain Report Called to,Read Back By and Verified With: NICKY  BEASLEY 09/16/14 @ 1:39PM BY RUSCOE A. Performed at [facility]    Report Status 09/18/2014 FINAL  Final   Organism ID, Bacteria STAPHYLOCOCCUS AUREUS  Final      Susceptibility   Staphylococcus aureus - MIC*    CLINDAMYCIN <=0.25 SENSITIVE Sensitive     ERYTHROMYCIN <=0.25 SENSITIVE Sensitive     GENTAMICIN <=0.5 SENSITIVE Sensitive     LEVOFLOXACIN <=0.12 SENSITIVE Sensitive     OXACILLIN 0.5 SENSITIVE Sensitive     PENICILLIN 0.12 SENSITIVE Sensitive     RIFAMPIN <=0.5 SENSITIVE Sensitive     TRIMETH/SULFA <=10 SENSITIVE Sensitive     VANCOMYCIN <=0.5 SENSITIVE Sensitive     TETRACYCLINE <=1 SENSITIVE Sensitive     MOXIFLOXACIN <=0.25 SENSITIVE Sensitive     * STAPHYLOCOCCUS AUREUS  Culture, blood (routine x 2)     Status: None   Collection Time: 09/22/2014  9:03 PM  Result Value Ref Range Status   Specimen Description BLOOD LEFT FOREARM  Final   Special Requests BOTTLES DRAWN AEROBIC AND ANAEROBIC 5CC EACH  Final   Culture  Setup Time   Final    09/16/2014 03:54 Performed at [facility]    Culture   Final    STAPHYLOCOCCUS AUREUS Note: SUSCEPTIBILITIES PERFORMED ON PREVIOUS CULTURE WITHIN THE LAST 5 DAYS. Note: Gram Stain Report Called to,Read Back By and Verified With: NICKY BEASLEY 09/16/14 @ 1:40PM BY RUSCOE A. Performed at [facility]    Report Status 09/18/2014 FINAL  Final  Culture, blood (routine x 2)     Status: None (Preliminary result)   Collection Time: 09/17/14  1:00 PM  Result Value Ref Range Status   Specimen Description BLOOD LEFT ARM  Final   Special Requests BOTTLES DRAWN AEROBIC ONLY 1CC  Final   Culture  Setup Time   Final    09/17/2014 18:24 Performed at [facility]    Culture   Final           BLOOD CULTURE RECEIVED NO GROWTH TO DATE CULTURE WILL BE HELD FOR 5 DAYS BEFORE ISSUING A FINAL NEGATIVE REPORT Performed at [facility]    Report Status PENDING  Incomplete  Clostridium Difficile by PCR      Status: None   Collection Time: 09/18/14  8:15 AM  Result Value Ref Range Status   C difficile by pcr NEGATIVE NEGATIVE Final    Studies/Results: Dg Chest Port 1 View  09/18/2014   CLINICAL DATA:  Central line placement.  EXAM: PORTABLE CHEST - 1 VIEW  COMPARISON:  09/17/2014  FINDINGS: Left central venous catheter has been  inserted. Tip overlies the low SVC region. No pneumothorax. Shallow inspiration. Borderline heart size with normal pulmonary vascularity. Calcified and tortuous aorta.  IMPRESSION: Left central venous catheter tip over the low SVC region. No pneumothorax.   Electronically Signed   By: Lucienne Capers M.D.   On: 09/18/2014 22:49     Assessment/Plan: Staph bacteremia/ MSSA CVA CAD Non-Q MI Afib DM2 As ESRD Diarrhea- C diff (-)  Total days of antibiotics: 6 ancef Dr Merrilee Jansky note reviewed.  Need final decision on comfort care (currently notes say "strongly consider/encourage") On dopamine Would continue ancef for 42 days unless he is made comfort care then would defer this duration to his primary.  Available if questions          Bobby Rumpf Infectious Diseases (pager) [phone] [email or web address] 09/20/2014, 9:48 AM  LOS: 5 days

## 2014-09-20 NOTE — Progress Notes (Signed)
NUTRITION FOLLOW-UP  INTERVENTION: IF enteral nutrition desired recommend:  Start Nepro with Carb Steady @ 20 ml/hr and increase by 10 ml every 12 hours to 40 ml/hr.  30 ml Prostat TID  Provides: 2028 kcal, 122 grams protein, and 697 ml H2O.   NUTRITION DIAGNOSIS: Increased nutrient needs related to chronic illness as evidenced by estimated nutrition needs; ongoing.   Goal: Pt to meet >/= 90% of their estimated nutrition needs; not met.   Monitor:  PO intake, weight trends, labs, I/O's  ASSESSMENT: Pt with PMH for coronary artery disease history of silent inferior wall myocardial infarction subsequently had PTCA stenting to LAD and left circumflex noted to have chronically occluded RCA. Came to the ER by EMS as patient was noted to be confused and had left-sided weakness and left facial droop earlier this morning.  Labs: High BUN and creatinine, unable to do HD at this time as pt is too unstable per nephrology.   Pt s/p FEES this am, SLP recommends NPO with nutrition vs comfort feeds.  Per MD notes, recommending comfort care.   Height: Ht Readings from Last 1 Encounters:  09/22/2014 6' (1.829 m)    Weight: Wt Readings from Last 1 Encounters:  09/18/14 193 lb 5.5 oz (87.7 kg)  Admission weight: 182 lb (82.6 kg) 12/5  BMI:  Body mass index is 26.22 kg/(m^2).  Estimated Nutritional Needs: Kcal: 2000-2200 Protein: 105-115 grams Fluid: Per MD  Skin: non-pitting LE edema  Diet Order: Diet NPO time specified Except for: Other (See Comments)    Intake/Output Summary (Last 24 hours) at 09/20/14 1439 Last data filed at 09/20/14 1400  Gross per 24 hour  Intake 1112.53 ml  Output      2 ml  Net 1110.53 ml    Last BM: 12/9  Labs:   Recent Labs Lab 09/11/2014 2257  09/17/14 0030 09/17/14 0250 09/18/14 0335 09/20/14 0500  NA  --   < > 136* 138 139 138  K  --   < > 3.8 4.0 3.5* 3.9  CL  --   < > 94* 95* 100 100  CO2  --   < > _0 BUN  --   < > 31* 37* 52*  54*  CREATININE  --   < > 3.52* 3.90* 6.88* 6.35*  CALCIUM  --   < > 8.4 8.5 8.6 9.1  MG 1.7  --  1.9  --   --   --   PHOS  --   --  5.2*  --   --   --   GLUCOSE  --   < > 202* 211* 219* 179*  < > = values in this interval not displayed.  CBG (last 3)   Recent Labs  09/19/14 1713 09/20/14 0757 09/20/14 1227  GLUCAP 176* 172* 124*    Scheduled Meds: . atorvastatin  40 mg Oral q1800  . calcium acetate  667 mg Oral TID WC  . feeding supplement (NEPRO CARB STEADY)  237 mL Oral BID BM  . insulin aspart  0-9 Units Subcutaneous TID WC  . midazolam  1 mg Intravenous Once  . multivitamin  1 tablet Oral QHS  . pantoprazole  40 mg Oral Daily  . sodium chloride  10-40 mL Intracatheter Q12H  . ticagrelor  60 mg Oral BID  . warfarin  2 mg Oral ONCE-1800  . Warfarin - Pharmacist Dosing Inpatient   Does not apply q1800    Continuous Infusions: .  sodium chloride 10 mL/hr at 09/18/14 0800  . dextrose 5 % and 0.45% NaCl 25 mL/hr at 09/19/14 2053  . DOPamine 5 mcg/kg/min (09/20/14 0929)  . norepinephrine (LEVOPHED) Adult infusion Stopped (09/19/14 1800)     RD, LDN, CNSC [phone] Pager [phone] After Hours Pager  

## 2014-09-20 NOTE — Plan of Care (Signed)
Problem: Acute Treatment Outcomes Goal: Neuro exam at baseline or improved Outcome: Not Progressing Goal: BP within ordered parameters Outcome: Progressing

## 2014-09-20 NOTE — Plan of Care (Signed)
Problem: Acute Treatment Outcomes Goal: Prognosis discussed with family/patient as appropriate Outcome: Progressing

## 2014-09-20 NOTE — Plan of Care (Signed)
Problem: Consults Goal: Ischemic Stroke Patient Education See Patient Education Module for education specifics.  Outcome: Not Met (add Reason) Pt is unable to.

## 2014-09-20 NOTE — Progress Notes (Addendum)
STROKE TEAM PROGRESS NOTE   HISTORY Brad Mcintosh is an 79 y.o. male who is here with the report of weakness left side, associated with confusion, that was noticed prior to his dialysis treatment this morning. He was transferred here by EMS. The patient denies any problems and states he is not sure why he is here. He went to dialysis, from his home this morning by private vehicle, driven by his son. He states that he was able to walk into dialysis. He did not eat breakfast this morning. He denies headache or weakness. He complains of pain in his left upper arm. There's been no known trauma.  Unable to reach family so no further information available. Per review of records he has history of HTN, paroxysmal A fib (on ASA and brilinta), ESRD on HD.   Date last known well: 09/14/2014 Time last known well: 2100 tPA Given: no, outside window  SUBJECTIVE (INTERVAL HISTORY) His RN is at the bedside.  Overall he feels his condition is unchanged. His BP remains labile.   He is on dopamine drip and  . Warfarin ordered..  OBJECTIVE Temp:  [97.8 F (36.6 C)-99.5 F (37.5 C)] 98.1 F (36.7 C) (12/10 1200) Pulse Rate:  [32-99] 49 (12/10 1400) Cardiac Rhythm:  [-] Atrial fibrillation (12/10 1200) Resp:  [0-35] 18 (12/10 1300) BP: (84-147)/(35-61) 126/44 mmHg (12/10 1400) SpO2:  [92 %-100 %] 100 % (12/10 1400) Arterial Line BP: (61-153)/(41-79) 83/79 mmHg (12/10 1300)   Recent Labs Lab 09/19/14 0805 09/19/14 1222 09/19/14 1713 09/20/14 0757 09/20/14 1227  GLUCAP 130* 193* 176* 172* 124*    Recent Labs Lab 09/24/2014 2257  09/16/14 2205 09/17/14 0030 09/17/14 0250 09/18/14 0335 09/20/14 0500  NA  --   < > 135* 136* 138 139 138  K  --   < > 3.8 3.8 4.0 3.5* 3.9  CL  --   < > 94* 94* 95* 100 100  CO2  --   < > 22 20 25 19 19   GLUCOSE  --   < > 201* 202* 211* 219* 179*  BUN  --   < > 32* 31* 37* 52* 54*  CREATININE  --   < > 3.26* 3.52* 3.90* 6.88* 6.35*  CALCIUM  --   < > 8.4 8.4  8.5 8.6 9.1  MG 1.7  --   --  1.9  --   --   --   PHOS  --   --   --  5.2*  --   --   --   < > = values in this interval not displayed.  Recent Labs Lab 09/25/2014 0756 09/17/14 0030  AST 27 30  ALT 17 24  ALKPHOS 57 60  BILITOT 2.9* 2.3*  PROT 6.9 5.8*  ALBUMIN 3.1* 2.2*    Recent Labs Lab 09/14/2014 0756 09/16/14 0527 09/16/14 2205 09/17/14 0250 09/18/14 0335  WBC 18.8* 17.0* 17.4* 19.0* 19.0*  NEUTROABS 17.3*  --  15.5*  --   --   HGB 12.5* 12.3* 12.2* 12.1* 11.1*  HCT 38.9* 37.8* 36.9* 37.1* 32.8*  MCV 89.6 90.9 88.5 88.1 87.0  PLT 141* 118* 110* 121* 105*    Recent Labs Lab 09/22/2014 2103 09/16/14 0527 09/16/14 2205 09/17/14 0250  TROPONINI 2.16* 2.30* 2.37*  2.59* 3.10*    Recent Labs  09/19/14 0930 09/20/14 0500  LABPROT 19.0* 18.4*  INR 1.58* 1.51*   No results for input(s): COLORURINE, LABSPEC, PHURINE, GLUCOSEU, HGBUR, BILIRUBINUR, KETONESUR, PROTEINUR, UROBILINOGEN, NITRITE,  LEUKOCYTESUR in the last 72 hours.  Invalid input(s): APPERANCEUR     Component Value Date/Time   CHOL 79 09/16/2014 0527   TRIG 59 09/16/2014 0527   HDL 21* 09/16/2014 0527   CHOLHDL 3.8 09/16/2014 0527   VLDL 12 09/16/2014 0527   LDLCALC 46 09/16/2014 0527   Lab Results  Component Value Date   HGBA1C 5.4 09/16/2014   No results found for: LABOPIA, COCAINSCRNUR, LABBENZ, AMPHETMU, THCU, LABBARB   Recent Labs Lab 10/08/2014 0756  ETH <11    Ct Head Wo Contrast  10/06/2014   IMPRESSION: 1. No acute intracranial findings. 2. Stable atrophy and chronic small vessel ischemic changes. 3. Prominence of the superior orbital veins of undetermined significance/etiology.    Dg Chest Port 1 View  10/09/2014  IMPRESSION: Low lung volumes.  No evidence of acute cardiopulmonary disease.     CUS - There is 1-39% left ICA stenosis. Unable to scan right side secondary to patient's refusal to turn head and confusion.   2D echo - pending report.    Component     Latest Ref  Rng 09/23/2014 09/16/2014  Cholesterol     0 - 200 mg/dL  79  Triglycerides     <150 mg/dL  59  HDL     >39 mg/dL  21 (L)  Total CHOL/HDL Ratio       3.8  VLDL     0 - 40 mg/dL  12  LDL (calc)     0 - 99 mg/dL  46  Hgb A1c MFr Bld     <5.7 % 5.4 5.4  Mean Plasma Glucose     <117 mg/dL 108 108  Troponin I     <0.30 ng/mL 2.16 (HH) 2.30 (HH)  TSH     0.350 - 4.500 uIU/mL 1.100     PHYSICAL EXAM  Temp:  [97.8 F (36.6 C)-99.5 F (37.5 C)] 98.1 F (36.7 C) (12/10 1200) Pulse Rate:  [32-99] 49 (12/10 1400) Resp:  [0-35] 18 (12/10 1300) BP: (84-147)/(35-61) 126/44 mmHg (12/10 1400) SpO2:  [92 %-100 %] 100 % (12/10 1400) Arterial Line BP: (61-153)/(41-79) 83/79 mmHg (12/10 1300)  General - cachectic, well developed, on face mask for O2 and disorientated.  Ophthalmologic - not able to perform due to incooperation.  Cardiovascular - in and out of afib rhythm.  Neuro - awake and alert but confused and oriented to self only Nonfluent hesitant speech. Speaks few words and short sentences only.Dysarthric. Follows a couple of simple commands and then refused to follow commands. Blinking to visual threat bilaterally but not moving eyes as requested. Spontaneously moving eyes to both horizontal directions. PERRL. Facial symmetrical, tongue in middle,     . BLE withdraw to pain and able to against gravity. Suspect mild left hemiparesis but cooperation limited Reflex diminished and no babinski.  ASSESSMENT/PLAN Mr. Brad Mcintosh is a 79 y.o. male with history of afib not on [facility], CAD and MI s/p stent 10/2013 on ASA and brilinta, HTN, ESRD on HD was admitted due to confusion and slurry speech prior to HD session. Questionable left sided weakness likely due to pain.    Generalized weakness and painful UEs - likely related to NSTEMI and unclear etiology for BUE tenderness  Pt has bilaterally UE tender to touch, today R>L, not able to test muscle strength  As per history, he was admitted for  generalized weakness, slurry speech  His initially reported left sided weakness seems due to arm pain  Less  supicious for stroke etiology MRI  Multiple scattered small  acute infarcts and both cerebral and cerebellar hemispheres. Pattern suggestive of recent  embolic event   MRA  suboptimal motion artefacts but no large vessel stenosis  Carotid Doppler  40-59% bilateral ICAs.   2D Echo  pending  LDL 46, at the goal  HgbA1c 5.4 at the goal  On heparin drip now also for VTE prophylaxis  Diet NPO time specified Except for: Other (See Comments)   aspirin 81 mg orally every day and and brilinta prior to admission, now on heparin IV  NSTEMI - elevated troponin, but he has ESRD and the current level is much less than in 10/2013.  - cardiology managing - on heparin drip - continue to trend troponin  Intermittent Afib - in and out of afib rhythm - on heparin drip - once stable, progress to coumadin bridge with lovenox or think about NOAC - rate control.  Bilateral UE pain - unclear etiology - R>L - recommend X-ray of bilateral shoulder.  CAD and MI s/p stent - on ASA and brilinta prior to admission - currently on heparin drip  Hypotension  On dopamine drip  Once stable, may consider MRI  Avoid hypotension  ESRD on HD - nephrology on board - management per nephrology  Other Stroke Risk Factors  Advanced age  Hospital day # 5 I have personally examined this patient, reviewed notes, independently viewed imaging studies, participated in medical decision making and plan of care. I have made any additions or clarifications directly to the above note. Agree with note above.  I have spoken with patient's son Seib over the phone on 09/19/14 about his overall prognosis being poor given cardiogenic shock, recent MI, renal failure and multiple small embolic strokes. He likely needs to be on long-term anticoagulation but is at high risk for bleeding given the fact that he  needs to be on aspirin and Brillinta from cardiac standpoint and warfarin from stroke standpoint. The son understands but wants him on full support for now. I would strongly encourage consideration for Comfort Care/DO NOT RESUSCITATE .Consult palliative care. Discussed with Dr. Doylene Canard. Stroke team will sign off at this time. Antony Contras, MD Medical Director [facility] Pager: [phone] 09/20/2014 2:41 PM      To contact Stroke Continuity provider, please refer to [email or web address] After hours, contact General Neurology

## 2014-09-20 NOTE — Progress Notes (Signed)
ANTICOAGULATION CONSULT NOTE - Follow Up Consult  Pharmacy Consult for coumadin Indication: afib/CVA  Allergies  Allergen Reactions  . Altace [Ramipril] Other (See Comments) and Cough    Also renal failure    Patient Measurements: Height: 6' (182.9 cm) Weight: 193 lb 5.5 oz (87.7 kg) IBW/kg (Calculated) : 77.6  Vital Signs: Temp: 97.8 F (36.6 C) (12/10 0700) Temp Source: Rectal (12/10 0700) BP: 111/58 mmHg (12/10 0800) Pulse Rate: 73 (12/10 0800)  Labs:  Recent Labs  09/18/14 0035 09/18/14 0335 09/18/14 1000 09/19/14 0930 09/20/14 0500  HGB  --  11.1*  --   --   --   HCT  --  32.8*  --   --   --   PLT  --  105*  --   --   --   LABPROT  --   --   --  19.0* 18.4*  INR  --   --   --  1.58* 1.51*  HEPARINUNFRC <0.10*  --  <0.10*  --   --   CREATININE  --  6.88*  --   --  6.35*    Estimated Creatinine Clearance: 10.7 mL/min (by C-G formula based on Cr of 6.35).   Medications:  Scheduled:  . atorvastatin  40 mg Oral q1800  . calcium acetate  667 mg Oral TID WC  . feeding supplement (NEPRO CARB STEADY)  237 mL Oral BID BM  . insulin aspart  0-9 Units Subcutaneous TID WC  . midazolam  1 mg Intravenous Once  . multivitamin  1 tablet Oral QHS  . pantoprazole  40 mg Oral Daily  . sodium chloride  10-40 mL Intracatheter Q12H  . ticagrelor  60 mg Oral BID  . Warfarin - Pharmacist Dosing Inpatient   Does not apply q1800    Assessment: 79 yo male with NSTEMI and embolic CVA with afib to begin coumadin. Patient was on heparin 12/8 and no further plans for heparin per Dr. Doylene Canard.  Aspirin to be discontinued and Brilinta to change to 60mg  po bid per Dr. Merrilee Jansky request in setting of stent placed 11/09/2013 and hx of MI x2.    INR noted 2.89 on 12/6 (no coumadin PTA) and now 1.51 (trend down). On 12/7 AST/ALT= 30/24, t bili= 2.3 and albumin= 2.3  He is also on ancef for MSSA bacteremia doses for ESRD. HD has been TTS but holding today per renal.  Possible HD on  12/11.  Goal of Therapy:  INR 2-3 Monitor platelets by anticoagulation protocol: Yes   Plan:   -Coumadin 2mg  po x1 (low dose due to recent elevated INR) -Daily PT/INR -Hold ancef for now and will restart based on HD plans  Hildred Laser, Pharm D 09/20/2014 11:33 AM

## 2014-09-20 NOTE — Progress Notes (Signed)
Assessment/Plan 1. Embolic CVA- Neurology seeing 2. Nstemi / Hx CAD w occluded RCA and stents to LAD/LCx- per Card 3. ESRD - HD TTS, too unstable today for HD Will follow  4. HTN/vol, now low BPs - on clon/ MTP at home.  5. A. Fib with slow VR -per Dr. Terrence Dupont  6. MSSA bacteremia  Subjective: Interval History: none.  Objective: Vital signs in last 24 hours: Temp:  [97.8 F (36.6 C)-99.5 F (37.5 C)] 97.8 F (36.6 C) (12/10 0700) Pulse Rate:  [32-99] 73 (12/10 0800) Resp:  [0-35] 17 (12/10 0800) BP: (89-147)/(38-77) 111/58 mmHg (12/10 0800) SpO2:  [92 %-100 %] 95 % (12/10 0800) Arterial Line BP: (89-153)/(41-75) 153/52 mmHg (12/10 0800) Weight change:   Intake/Output from previous day: 12/09 0701 - 12/10 0700 In: 998.3 [P.O.:120; I.V.:878.3] Out: 2 [Stool:2] Intake/Output this shift:    General appearance: cooperative and confused Resp: clear to auscultation bilaterally Chest wall: no tenderness Cardio: irregularly irregular rhythm Neurologic: does not finish sentences, not oriented  Lab Results:  Recent Labs  09/18/14 0335  WBC 19.0*  HGB 11.1*  HCT 32.8*  PLT 105*   BMET:  Recent Labs  09/18/14 0335 09/20/14 0500  NA 139 138  K 3.5* 3.9  CL 100 100  CO2 19 19  GLUCOSE 219* 179*  BUN 52* 54*  CREATININE 6.88* 6.35*  CALCIUM 8.6 9.1   No results for input(s): PTH in the last 72 hours. Iron Studies: No results for input(s): IRON, TIBC, TRANSFERRIN, FERRITIN in the last 72 hours. Studies/Results: Dg Chest Port 1 View  09/18/2014   CLINICAL DATA:  Central line placement.  EXAM: PORTABLE CHEST - 1 VIEW  COMPARISON:  09/17/2014  FINDINGS: Left central venous catheter has been inserted. Tip overlies the low SVC region. No pneumothorax. Shallow inspiration. Borderline heart size with normal pulmonary vascularity. Calcified and tortuous aorta.  IMPRESSION: Left central venous catheter tip over the low SVC region. No pneumothorax.   Electronically Signed    By: Lucienne Capers M.D.   On: 09/18/2014 22:49    Scheduled: . atorvastatin  40 mg Oral q1800  . calcium acetate  667 mg Oral TID WC  .  ceFAZolin (ANCEF) IV  2 g Intravenous Q T,Th,Sa-HD  . digoxin  0.25 mg Intravenous Daily  . feeding supplement (NEPRO CARB STEADY)  237 mL Oral BID BM  . insulin aspart  0-9 Units Subcutaneous TID WC  . midazolam  1 mg Intravenous Once  . multivitamin  1 tablet Oral QHS  . pantoprazole  40 mg Oral Daily  . sodium chloride  10-40 mL Intracatheter Q12H  . ticagrelor  60 mg Oral BID  . Warfarin - Pharmacist Dosing Inpatient   Does not apply q1800     LOS: 5 days   Ramsha Lonigro C 09/20/2014,9:18 AM

## 2014-09-20 NOTE — Progress Notes (Signed)
Contacted Dr. Doylene Canard about patient's low MAP (40's-50's), low HR (40's-50's compared to previous rate in the 80's) and irregular respirations. MD at bedside. Gave orders to start on 63mcg Dopamine. Dopamine started and HR and BP improved. Will continue to monitor patient.

## 2014-09-20 NOTE — Procedures (Signed)
Objective Swallowing Evaluation: Fiberoptic Endoscopic Evaluation of Swallowing  Patient Details  Name: Brad Mcintosh MRN: [id number] Date of Birth: [birth year]  Today's Date: 09/20/2014 Time: 0842-0910 SLP Time Calculation (min) (ACUTE ONLY): 28 min  Past Medical History:  Past Medical History  Diagnosis Date  . Hypertension   . Hyperlipidemia   . Cancer     prostate  . Gout   . Anemia     Iron deficiency  . Thyroid disease   . Malnutrition   . Atrial fibrillation     paroxysmal  . Chronic kidney disease     Pt has HD on TUESDAY, THURSDAY and SATURDAY  at [facility]  . Peripheral vascular disease   . Coronary artery disease   . Shortness of breath   . GERD (gastroesophageal reflux disease)   . Aortic stenosis     moderate AS by 10/2013 echo  . Myocardial infarction 10/2013  . Pneumonia 2015  . History of blood transfusion   . Internal hemorrhoids   . Diverticulosis   . Hyperplastic colon polyp   . Anemia    Past Surgical History:  Past Surgical History  Procedure Laterality Date  . Av fistula placement  06/2006    left forearm  Pt denies  . Amputation      right finger  . Incise and drain abcess      chronic complex infection right long finger  . Insertion of dialysis catheter  05/30/2010    right internal jugular Palindrome catheter  . Aortogram  06/01/11    w/ runoff  . Tee without cardioversion N/A 10/25/2013    Procedure: TRANSESOPHAGEAL ECHOCARDIOGRAM (TEE);  Surgeon: [name], MD;  Location: [place];  Service: Cardiovascular;  Laterality: N/A;  . Insertion of dialysis catheter N/A 10/25/2013    Procedure: INSERTION OF DIALYSIS CATHETER;  Surgeon: Elam Dutch, MD;  Location: [place];  Service: Vascular;  Laterality: N/A;  Attempted both right and left neck, procedure unsucessful.  . Cardiac catheterization    . Toe amputation      bil feet  . Coronary angioplasty  11/09/13    LAD Stented  . Av fistula placement Right 01/31/2014    Procedure:  INSERTION OF ARTERIOVENOUS (AV) GORE-TEX GRAFT ARM- RIGHT FOREARM;  Surgeon: Conrad [place], MD;  Location: [facility];  Service: Vascular;  Laterality: Right;   HPI:  79 y.o. male with history of A fib, hypertension, ESRD on HD presenting with left sided weakness and confusion found to have an elevated troponin (2.33) in the ED. Admitted for NSTEMI.  Transferred to CCU 12/5 because of hypotension.  MRI pending.       Assessment / Plan / Recommendation Clinical Impression  Dysphagia Diagnosis: Moderate pharyngeal phase dysphagia;Moderate cervical esophageal phase dysphagia Clinical impression: Pt did not tolerate procedure well and assessment was limited. Pt agitated by scope, bloody nasal secretions obscured view and then pts heart rate slowed to 49. FEES discontinued at that time.  Briefly able to see delayed swallow, decreased laryngeal elevation, weak base of tongue propulsion,  vallecular and lateral channel residue and penetration of residuals after the swallow. Pt constantly coughing during exam even when airway appeared clear. Pt also with 2 episodes of either backflow or laryngopharyngeal reflux with oral expectoration.   Overall pt is at high risk of aspiration due to poor mentation and awareness, weakness, inability to follow commands or participate in strategies and appearance of a primary esophageal dysphagia. Would not be able to  recommend any safe diet at this time given poor function and the multitude of risk factors. Pt has also had very poor PO intake regardless of restrictions. Recommend consideration of comfort feeds with known risk. If this is not agreed upon, pt may need short term alternate nutrition.     Treatment Recommendation  Therapy as outlined in treatment plan below    Diet Recommendation NPO except meds   Medication Administration: Whole meds with puree Supervision: Full supervision/cueing for compensatory strategies Compensations: Slow rate;Small sips/bites Postural  Changes and/or Swallow Maneuvers: Seated upright 90 degrees    Other  Recommendations Recommended Consults: Other (Comment) (Palliative care consult) Oral Care Recommendations: Oral care BID   Follow Up Recommendations  Skilled Nursing facility    Frequency and Duration min 2x/week  2 weeks   Pertinent Vitals/Pain NA    SLP Swallow Goals     General HPI: 79 y.o. male with history of A fib, hypertension, ESRD on HD presenting with left sided weakness and confusion found to have an elevated troponin (2.33) in the ED. Admitted for NSTEMI.  Transferred to CCU 12/5 because of hypotension.  MRI pending.   Type of Study: Fiberoptic Endoscopic Evaluation of Swallowing Reason for Referral: Objectively evaluate swallowing function Diet Prior to this Study: Dysphagia 1 (puree);Nectar-thick liquids Temperature Spikes Noted: No Respiratory Status: Nasal cannula History of Recent Intubation: No Behavior/Cognition: Alert;Distractible;Cooperative;Requires cueing Oral Cavity - Dentition: Dentures, not available Oral Motor / Sensory Function: Within functional limits Self-Feeding Abilities: Total assist Patient Positioning: Upright in bed Baseline Vocal Quality: Clear Volitional Cough: Cognitively unable to elicit Volitional Swallow: Unable to elicit Anatomy: Within functional limits    Reason for Referral Objectively evaluate swallowing function   Oral Phase Oral Preparation/Oral Phase Oral Phase: Impaired Oral - Nectar Oral - Nectar Teaspoon: Delayed oral transit;Lingual/palatal residue Oral - Solids Oral - Puree: Delayed oral transit;Lingual/palatal residue   Pharyngeal Phase Pharyngeal Phase Pharyngeal Phase: Impaired Pharyngeal - Nectar Pharyngeal - Nectar Teaspoon: Delayed swallow initiation;Premature spillage to pyriform sinuses;Reduced epiglottic inversion;Reduced laryngeal elevation;Reduced tongue base retraction;Penetration/Aspiration after swallow;Pharyngeal residue -  valleculae;Lateral channel residue;Compensatory strategies attempted (Comment) (Pt could not follow commands for mulitple swallows) Pharyngeal - Solids Pharyngeal - Puree: Delayed swallow initiation (unable to visualize due to scope being obscured. )  Cervical Esophageal Phase    GO    Cervical Esophageal Phase Cervical Esophageal Phase: Impaired Cervical Esophageal Phase - Comment Cervical Esophageal Comment: regurgitation and backflow of nectar thick liquids.         Herbie [place], [place] CCC-SLP [phone]  [name] 09/20/2014, 9:35 AM

## 2014-09-21 DIAGNOSIS — Z515 Encounter for palliative care: Secondary | ICD-10-CM | POA: Insufficient documentation

## 2014-09-21 LAB — HEPATITIS B SURFACE ANTIGEN: Hepatitis B Surface Ag: NEGATIVE

## 2014-09-21 LAB — BASIC METABOLIC PANEL
ANION GAP: 21 — AB (ref 5–15)
BUN: 70 mg/dL — ABNORMAL HIGH (ref 6–23)
CHLORIDE: 100 meq/L (ref 96–112)
CO2: 20 mEq/L (ref 19–32)
Calcium: 9.3 mg/dL (ref 8.4–10.5)
Creatinine, Ser: 7.25 mg/dL — ABNORMAL HIGH (ref 0.50–1.35)
GFR calc non Af Amer: 6 mL/min — ABNORMAL LOW (ref >90)
GFR, EST AFRICAN AMERICAN: 7 mL/min — AB (ref >90)
Glucose, Bld: 152 mg/dL — ABNORMAL HIGH (ref 70–99)
POTASSIUM: 4.8 meq/L (ref 3.7–5.3)
Sodium: 141 mEq/L (ref 137–147)

## 2014-09-21 LAB — PROTIME-INR
INR: 1.64 — AB (ref 0.00–1.49)
PROTHROMBIN TIME: 19.5 s — AB (ref 11.6–15.2)

## 2014-09-21 LAB — GLUCOSE, CAPILLARY
GLUCOSE-CAPILLARY: 120 mg/dL — AB (ref 70–99)
Glucose-Capillary: 134 mg/dL — ABNORMAL HIGH (ref 70–99)
Glucose-Capillary: 117 mg/dL — ABNORMAL HIGH (ref 70–99)

## 2014-09-21 LAB — CBC WITH DIFFERENTIAL/PLATELET
Basophils Absolute: 0 10*3/uL (ref 0.0–0.1)
Basophils Relative: 0 % (ref 0–1)
EOS ABS: 0 10*3/uL (ref 0.0–0.7)
Eosinophils Relative: 0 % (ref 0–5)
HCT: 33.7 % — ABNORMAL LOW (ref 39.0–52.0)
Hemoglobin: 11 g/dL — ABNORMAL LOW (ref 13.0–17.0)
LYMPHS PCT: 7 % — AB (ref 12–46)
Lymphs Abs: 1.1 10*3/uL (ref 0.7–4.0)
MCH: 28.8 pg (ref 26.0–34.0)
MCHC: 32.6 g/dL (ref 30.0–36.0)
MCV: 88.2 fL (ref 78.0–100.0)
MONO ABS: 0.5 10*3/uL (ref 0.1–1.0)
Monocytes Relative: 3 % (ref 3–12)
NEUTROS PCT: 90 % — AB (ref 43–77)
Neutro Abs: 13.7 10*3/uL — ABNORMAL HIGH (ref 1.7–7.7)
Platelets: 102 10*3/uL — ABNORMAL LOW (ref 150–400)
RBC: 3.82 MIL/uL — AB (ref 4.22–5.81)
RDW: 20 % — ABNORMAL HIGH (ref 11.5–15.5)
WBC: 15.3 10*3/uL — ABNORMAL HIGH (ref 4.0–10.5)

## 2014-09-21 MED ORDER — CEFAZOLIN SODIUM-DEXTROSE 2-3 GM-% IV SOLR
2.0000 g | Freq: Once | INTRAVENOUS | Status: AC
  Administered 2014-09-21: 2 g via INTRAVENOUS
  Filled 2014-09-21: qty 50

## 2014-09-21 MED ORDER — SODIUM CHLORIDE 0.9 % IV BOLUS (SEPSIS)
250.0000 mL | Freq: Once | INTRAVENOUS | Status: AC
  Administered 2014-09-21: 250 mL via INTRAVENOUS

## 2014-09-21 MED ORDER — WARFARIN SODIUM 3 MG PO TABS
3.0000 mg | ORAL_TABLET | Freq: Once | ORAL | Status: AC
  Administered 2014-09-21: 3 mg via ORAL
  Filled 2014-09-21: qty 1

## 2014-09-21 NOTE — Progress Notes (Signed)
Late Entry Ref: [name], MD   Subjective:  Discussed with family about DNR. They want current treatment continued Patient is less confused this evening and able to recognize family members. Again wants to go home.   Objective:  Vital Signs in the last 24 hours: Temp:  [97.3 F (36.3 C)-98.4 F (36.9 C)] 97.7 F (36.5 C) (12/11 0700) Pulse Rate:  [32-92] 63 (12/11 0800) Cardiac Rhythm:  [-] Atrial fibrillation (12/11 0800) Resp:  [12-31] 31 (12/11 0800) BP: (96-138)/(36-73) 113/45 mmHg (12/11 0800) SpO2:  [94 %-100 %] 100 % (12/11 0800) Arterial Line BP: (83-123)/(58-79) 83/79 mmHg (12/10 1300)  Physical Exam: BP Readings from Last 1 Encounters:  09/21/14 113/45    Wt Readings from Last 1 Encounters:  09/18/14 87.7 kg (193 lb 5.5 oz)    Weight change:   HEENT: [place]/AT, Eyes-Brown, PERL, EOMI, Conjunctiva-Pink, Sclera-Non-icteric Neck: No JVD, No bruit, Trachea midline. Lungs:  Clear, Bilateral. Cardiac:  Regular rhythm, normal S1 and S2, no S3.  Abdomen:  Soft, non-tender. Extremities:  No edema present. No cyanosis. No clubbing. CNS: AxOx3, Cranial nerves grossly intact, moves all 4 extremities. Right handed. Skin: Warm and dry.   Intake/Output from previous day: 12/10 0701 - 12/11 0700 In: 844.6 [I.V.:844.6] Out: -     Lab Results: BMET    Component Value Date/Time   NA 141 09/21/2014 0420   NA 138 09/20/2014 0500   NA 139 09/18/2014 0335   K 4.8 09/21/2014 0420   K 3.9 09/20/2014 0500   K 3.5* 09/18/2014 0335   CL 100 09/21/2014 0420   CL 100 09/20/2014 0500   CL 100 09/18/2014 0335   CO2 20 09/21/2014 0420   CO2 19 09/20/2014 0500   CO2 19 09/18/2014 0335   GLUCOSE 152* 09/21/2014 0420   GLUCOSE 179* 09/20/2014 0500   GLUCOSE 219* 09/18/2014 0335   BUN 70* 09/21/2014 0420   BUN 54* 09/20/2014 0500   BUN 52* 09/18/2014 0335   CREATININE 7.25* 09/21/2014 0420   CREATININE 6.35* 09/20/2014 0500   CREATININE 6.88* 09/18/2014 0335   CALCIUM 9.3  09/21/2014 0420   CALCIUM 9.1 09/20/2014 0500   CALCIUM 8.6 09/18/2014 0335   CALCIUM 7.8* 12/07/2009 0558   CALCIUM 8.7 10/25/2009 1301   CALCIUM 8.5 05/29/2009 0936   GFRNONAA 6* 09/21/2014 0420   GFRNONAA 8* 09/20/2014 0500   GFRNONAA 7* 09/18/2014 0335   GFRAA 7* 09/21/2014 0420   GFRAA 9* 09/20/2014 0500   GFRAA 8* 09/18/2014 0335   CBC    Component Value Date/Time   WBC 15.3* 09/21/2014 0420   RBC 3.82* 09/21/2014 0420   RBC 4.05* 10/21/2013 2230   HGB 11.0* 09/21/2014 0420   HCT 33.7* 09/21/2014 0420   PLT 102* 09/21/2014 0420   MCV 88.2 09/21/2014 0420   MCH 28.8 09/21/2014 0420   MCHC 32.6 09/21/2014 0420   RDW 20.0* 09/21/2014 0420   LYMPHSABS 1.1 09/21/2014 0420   MONOABS 0.5 09/21/2014 0420   EOSABS 0.0 09/21/2014 0420   BASOSABS 0.0 09/21/2014 0420   HEPATIC Function Panel  Recent Labs  10/21/13 1644 09/22/2014 0756 09/17/14 0030  PROT 5.4* 6.9 5.8*   HEMOGLOBIN A1C No components found for: HGA1C,  MPG CARDIAC ENZYMES Lab Results  Component Value Date   CKTOTAL 403* 12/09/2009   CKMB 4.9* 12/09/2009   TROPONINI 3.10* 09/17/2014   TROPONINI 2.59* 09/16/2014   TROPONINI 2.37* 09/16/2014   BNP No results for input(s): PROBNP in the last 8760 hours. TSH  Recent Labs  10/23/13 1135 09/19/2014 2257  TSH 1.730 1.100   CHOLESTEROL  Recent Labs  09/16/14 0527  CHOL 79    Scheduled Meds: . antiseptic oral rinse  7 mL Mouth Rinse q12n4p  . atorvastatin  40 mg Oral q1800  . calcium acetate  667 mg Oral TID WC  . chlorhexidine  15 mL Mouth Rinse BID  . feeding supplement (NEPRO CARB STEADY)  237 mL Oral BID BM  . multivitamin  1 tablet Oral QHS  . pantoprazole  40 mg Oral Daily  . sodium chloride  10-40 mL Intracatheter Q12H  . ticagrelor  60 mg Oral BID  . warfarin  3 mg Oral ONCE-1800  . Warfarin - Pharmacist Dosing Inpatient   Does not apply q1800   Continuous Infusions: . sodium chloride 10 mL/hr at 09/18/14 0800  . dextrose 5 % and  0.45% NaCl 25 mL/hr at 09/20/14 1900  . DOPamine 5 mcg/kg/min (09/21/14 0800)  . norepinephrine (LEVOPHED) Adult infusion Stopped (09/19/14 1800)   PRN Meds:.sodium chloride, sodium chloride, acetaminophen, feeding supplement (NEPRO CARB STEADY), Gerhardt's butt cream, heparin, heparin, lidocaine (PF), lidocaine-prilocaine, loperamide, nitroGLYCERIN, ondansetron (ZOFRAN) IV, ondansetron, pentafluoroprop-tetrafluoroeth, senna-docusate, sodium chloride  Assessment/Plan: Small non-Q-wave myocardial infarction with atypical presentation Biventricular heart failure Confusion/mild left-sided weakness  Cardioembolic CVA Hypotensive shock multifactorial i.e. hypovolemia post dialysis/rule out sepsis Atrial fibrillation with controlled ventricular response CAD history of MI 2 in the past status post PCI to LAD and left circumflex in January 2015 Hypertension Diabetes mellitus, II Moderate aortic stenosis Peripheral vascular disease End-stage renal disease on hemodialysis Hypercholesteremia Anemia of chronic disease Tobacco abuse Marked leukocytosis probably secondary to steroids rule out sepsis History of gouty arthritis History of CA of prostate   Continue current treatment with antibiotics.   LOS: 5 days    Dixie Dials  MD  09/21/2014, 9:14 AM

## 2014-09-21 NOTE — Consult Note (Signed)
Patient ID:Brad Mcintosh      DOB: [birth year]      FOY:[id number]     Consult Note from the Palliative Medicine Team at [facility] Requested by: Dr Doylene Canard     PCP: Brad Riddle, MD Reason for Consultation: Goals of Care    Phone [phone]  Assessment/Recommendations: 79 yo male with multiple medical problems who presented with weakness, confusion, dysrthria. Found to have NSTEMI, acute CVA, MSSA bacteremia.   1.  Code Status: DNR previously documented  2. Goals of Care: Patient confused and not able to demonstrate any understanding of situation. I spoke with son Brad Mcintosh and daughter Brad Mcintosh today. Brad Mcintosh states that he is power of attorney though we do not appear to have this documentation in our EMR.  Brad Mcintosh states he spoke at length with Dr Doylene Canard last night.  They were working through some options about where to go from here including potenital comfort care, SNF rehab, etc.  Dr Doylene Canard explained concerns to them about how he may do in future because of his current situation and comorbid illness' which Brad Mcintosh was able to at least explain somewhat.  Brad Mcintosh also verbalized that as long as he continues to be alert and have periods where he can communicate, he wants to continue all restorative efforts and frames the idea of comfort care around "giving up".  Brad Mcintosh feels like his dad has made progress and doing a bit better these past few days.  Notes that he seems more alert and having more moments where mentation seems clearer while acknowledging he continues to have fluctuations.  Will continue to follow along and try to obtain more information about values to Brad Mcintosh to see if this can help guide medical decision making based on his trajectory clinically in coming days.  Has been able to wean off levophed but still on dopamine for inotropic support.   3. Symptom Management:   1. Acute Encephalopathy- in setting of CVA, infxn, NSTEMI. Would acoid benzo's.  Try to minimize restraints  as able. Consider low dose haldol if medicines need to be used for agitation. His QTc is normal on EKG 12/7.    4. Psychosocial/Spiritual: Lives in [place]. 3 children. Son Brad Mcintosh reportedly POA.  Has another son "Brad Mcintosh" and daughter Brad Mcintosh.     Brief HPI: 79 yo male with PMHx of CAD s/p stents, ESRD, PVD, Ao Stenosis who presented on 12/5 with confusion left sided weakness, dysarthria.  Reportedly woke up with these symptoms and worsened at dialysis.  He was brought by EMS to ED where he was found to be in Afib, and have EKG/labs c/w NSTEMI.  Admitted to hospital and further workup has revealed acute lacunar infarcts as well as MSSA bacteremia.  He had continued cardiogenic shock requiring inotropic support.  Has not been able to continue regular dialysis schedule with hemodynamic instability.  Continued confusion and difficulty with swallowing noted. Palliative care consulted for goals of care.  Currently, Brad Mcintosh denies any pain, nausea, vomiting, dyspnea or other complaints. He is clearly confused and unable to tell me where he is or why he is in hospital.     ROS: Unable to obtain 2/2 acute encephalopathy    PMH:  Past Medical History  Diagnosis Date  . Hypertension   . Hyperlipidemia   . Cancer     prostate  . Gout   . Anemia     Iron deficiency  . Thyroid disease   . Malnutrition   .  Atrial fibrillation     paroxysmal  . Chronic kidney disease     Pt has HD on TUESDAY, THURSDAY and SATURDAY  at [facility]  . Peripheral vascular disease   . Coronary artery disease   . Shortness of breath   . GERD (gastroesophageal reflux disease)   . Aortic stenosis     moderate AS by 10/2013 echo  . Myocardial infarction 10/2013  . Pneumonia 2015  . History of blood transfusion   . Internal hemorrhoids   . Diverticulosis   . Hyperplastic colon polyp   . Anemia      PSH: Past Surgical History  Procedure Laterality Date  . Av fistula placement  06/2006    left forearm  Pt denies  .  Amputation      right finger  . Incise and drain abcess      chronic complex infection right long finger  . Insertion of dialysis catheter  05/30/2010    right internal jugular Palindrome catheter  . Aortogram  06/01/11    w/ runoff  . Tee without cardioversion N/A 10/25/2013    Procedure: TRANSESOPHAGEAL ECHOCARDIOGRAM (TEE);  Surgeon: Brad Riddle, MD;  Location: [place];  Service: Cardiovascular;  Laterality: N/A;  . Insertion of dialysis catheter N/A 10/25/2013    Procedure: INSERTION OF DIALYSIS CATHETER;  Surgeon: Elam Dutch, MD;  Location: [facility];  Service: Vascular;  Laterality: N/A;  Attempted both right and left neck, procedure unsucessful.  . Cardiac catheterization    . Toe amputation      bil feet  . Coronary angioplasty  11/09/13    LAD Stented  . Av fistula placement Right 01/31/2014    Procedure: INSERTION OF ARTERIOVENOUS (AV) GORE-TEX GRAFT ARM- RIGHT FOREARM;  Surgeon: Conrad Fabrica, MD;  Location: [place];  Service: Vascular;  Laterality: Right;  . Shuntogram N/A 12/12/2012    Procedure: Earney Mallet;  Surgeon: Conrad Conesville, MD;  Location: [facility] CATH LAB;  Service: Cardiovascular;  Laterality: N/A;  . Left heart catheterization with coronary angiogram N/A 11/09/2013    Procedure: LEFT HEART CATHETERIZATION WITH CORONARY ANGIOGRAM;  Surgeon: Clent Demark, MD;  Location: [facility] CATH LAB;  Service: Cardiovascular;  Laterality: N/A;  . Percutaneous coronary stent intervention (pci-s)  11/09/2013    Procedure: PERCUTANEOUS CORONARY STENT INTERVENTION (PCI-S);  Surgeon: Clent Demark, MD;  Location: [facility] CATH LAB;  Service: Cardiovascular;;  . Venogram Right 11/22/2013    Procedure: VENOGRAM;  Surgeon: Serafina Mitchell, MD;  Location: [facility] CATH LAB;  Service: Cardiovascular;  Laterality: Right;   I have reviewed the [place] and SH and  If appropriate update it with new information. Allergies  Allergen Reactions  . Altace [Ramipril] Other (See Comments) and Cough    Also renal failure    Scheduled Meds: . antiseptic oral rinse  7 mL Mouth Rinse q12n4p  . atorvastatin  40 mg Oral q1800  . calcium acetate  667 mg Oral TID WC  . chlorhexidine  15 mL Mouth Rinse BID  . feeding supplement (NEPRO CARB STEADY)  237 mL Oral BID BM  . multivitamin  1 tablet Oral QHS  . pantoprazole  40 mg Oral Daily  . sodium chloride  10-40 mL Intracatheter Q12H  . ticagrelor  60 mg Oral BID  . warfarin  3 mg Oral ONCE-1800  . Warfarin - Pharmacist Dosing Inpatient   Does not apply q1800   Continuous Infusions: . sodium chloride 10 mL/hr at 09/18/14 0800  .  dextrose 5 % and 0.45% NaCl 25 mL/hr at 09/20/14 1900  . DOPamine 5 mcg/kg/min (09/21/14 0800)  . norepinephrine (LEVOPHED) Adult infusion Stopped (09/19/14 1800)   PRN Meds:.sodium chloride, sodium chloride, acetaminophen, feeding supplement (NEPRO CARB STEADY), Gerhardt's butt cream, heparin, heparin, lidocaine (PF), lidocaine-prilocaine, loperamide, nitroGLYCERIN, ondansetron (ZOFRAN) IV, ondansetron, pentafluoroprop-tetrafluoroeth, senna-docusate, sodium chloride    BP 113/45 mmHg  Pulse 63  Temp(Src) 97.7 F (36.5 C) (Oral)  Resp 31  Ht 6' (1.829 m)  Wt 87.7 kg (193 lb 5.5 oz)  BMI 26.22 kg/m2  SpO2 100%   PPS: 30   Intake/Output Summary (Last 24 hours) at 09/21/14 0852 Last data filed at 09/21/14 0800  Gross per 24 hour  Intake 779.16 ml  Output      0 ml  Net 779.16 ml    Physical Exam:  General: Alert, confused HEENT:  [place], sclera anicteric Chest:   CTAB CVS: irregular Abdomen: soft, NT, ND Ext: cool distal ext Neuro: confused, follows simple commands  Labs: CBC    Component Value Date/Time   WBC 15.3* 09/21/2014 0420   RBC 3.82* 09/21/2014 0420   RBC 4.05* 10/21/2013 2230   HGB 11.0* 09/21/2014 0420   HCT 33.7* 09/21/2014 0420   PLT 102* 09/21/2014 0420   MCV 88.2 09/21/2014 0420   MCH 28.8 09/21/2014 0420   MCHC 32.6 09/21/2014 0420   RDW 20.0* 09/21/2014 0420   LYMPHSABS 1.1 09/21/2014 0420    MONOABS 0.5 09/21/2014 0420   EOSABS 0.0 09/21/2014 0420   BASOSABS 0.0 09/21/2014 0420    BMET    Component Value Date/Time   NA 141 09/21/2014 0420   K 4.8 09/21/2014 0420   CL 100 09/21/2014 0420   CO2 20 09/21/2014 0420   GLUCOSE 152* 09/21/2014 0420   BUN 70* 09/21/2014 0420   CREATININE 7.25* 09/21/2014 0420   CALCIUM 9.3 09/21/2014 0420   CALCIUM 7.8* 12/07/2009 0558   GFRNONAA 6* 09/21/2014 0420   GFRAA 7* 09/21/2014 0420    CMP     Component Value Date/Time   NA 141 09/21/2014 0420   K 4.8 09/21/2014 0420   CL 100 09/21/2014 0420   CO2 20 09/21/2014 0420   GLUCOSE 152* 09/21/2014 0420   BUN 70* 09/21/2014 0420   CREATININE 7.25* 09/21/2014 0420   CALCIUM 9.3 09/21/2014 0420   CALCIUM 7.8* 12/07/2009 0558   PROT 5.8* 09/17/2014 0030   ALBUMIN 2.2* 09/17/2014 0030   AST 30 09/17/2014 0030   ALT 24 09/17/2014 0030   ALKPHOS 60 09/17/2014 0030   BILITOT 2.3* 09/17/2014 0030   GFRNONAA 6* 09/21/2014 0420   GFRAA 7* 09/21/2014 0420   12/5 CT Head  IMPRESSION: 1. No acute intracranial findings. 2. Stable atrophy and chronic small vessel ischemic changes. 3. Prominence of the superior orbital veins of undetermined Significance/etiology.  12/7 Brad Brain IMPRESSION: 1. Multiple scattered small (lacunar type) acute infarcts and both cerebral and cerebellar hemispheres. Pattern suggestive of recent embolic event from the heart or proximal aorta. No associated mass effect or hemorrhage. 2. No circle of Willis large vessel occlusion or stenosis identified. 3. MRI discontinued prior to completion due to patient confusion.   Total Time: 40 minutes  Greater than 50%  of this time was spent counseling and coordinating care related to the above assessment and plan.  Doran Clay D.O. Palliative Medicine Team at [facility]  Pager: [phone] Team Phone: [phone]

## 2014-09-21 NOTE — Progress Notes (Addendum)
ANTICOAGULATION CONSULT NOTE - Follow Up Consult  Pharmacy Consult for coumadin, ancef Indication: afib/CVA, MSSA bacteremia  Allergies  Allergen Reactions  . Altace [Ramipril] Other (See Comments) and Cough    Also renal failure    Patient Measurements: Height: 6' (182.9 cm) Weight: 193 lb 5.5 oz (87.7 kg) IBW/kg (Calculated) : 77.6  Vital Signs: Temp: 97.7 F (36.5 C) (12/11 0400) Temp Source: Oral (12/11 0400) BP: 114/46 mmHg (12/11 0400) Pulse Rate: 62 (12/11 0400)  Labs:  Recent Labs  09/18/14 1000 09/19/14 0930 09/20/14 0500 09/21/14 0420  HGB  --   --   --  11.0*  HCT  --   --   --  33.7*  PLT  --   --   --  102*  LABPROT  --  19.0* 18.4* 19.5*  INR  --  1.58* 1.51* 1.64*  HEPARINUNFRC <0.10*  --   --   --   CREATININE  --   --  6.35* 7.25*    Estimated Creatinine Clearance: 9.4 mL/min (by C-G formula based on Cr of 7.25).   Medications:  Scheduled:  . antiseptic oral rinse  7 mL Mouth Rinse q12n4p  . atorvastatin  40 mg Oral q1800  . calcium acetate  667 mg Oral TID WC  . chlorhexidine  15 mL Mouth Rinse BID  . feeding supplement (NEPRO CARB STEADY)  237 mL Oral BID BM  . multivitamin  1 tablet Oral QHS  . pantoprazole  40 mg Oral Daily  . sodium chloride  10-40 mL Intracatheter Q12H  . ticagrelor  60 mg Oral BID  . Warfarin - Pharmacist Dosing Inpatient   Does not apply q1800    Assessment: 79 yo male with NSTEMI and embolic CVA with afib to begin coumadin. Patient was on heparin 12/8 and no further plans for heparin per Dr. Doylene Canard.  Aspirin to be discontinued and Brilinta to change to 60mg  po bid per Dr. Merrilee Jansky request in setting of stent placed 11/09/2013 and hx of MI x2.    INR noted 2.89 on 12/6 (no coumadin PTA) and now 1.64 (up from 12/10). On 12/7 AST/ALT= 30/24, t bili= 2.3 and albumin= 2.3  He is also on ancef for MSSA bacteremia doses for ESRD. HD has been TTS but holding today per renal. HD noted for 09/21/14. Antibiotic treatment  duration of 42 days per ID unless goals of care change (palliative care has been consulted).   Goal of Therapy:  INR 2-3 Monitor platelets by anticoagulation protocol: Yes   Plan:   -Coumadin 3mg  po x1  -Daily PT/INR -Ancef 2gm after HD today then will dose based on HD plans  Hildred Laser, Pharm D 09/21/2014 7:18 AM

## 2014-09-21 NOTE — Progress Notes (Signed)
SLP Cancellation Note  Patient Details Name: Brad Mcintosh MRN: [id number] DOB: 1935/03/14   Cancelled treatment:       Reason Eval/Treat Not Completed: Patient at procedure or test/unavailable. Pt receiving dialysis. SLP will return to check readiness for POs.    Demetrius Barrell, Katherene Ponto 09/21/2014, 1:21 PM

## 2014-09-21 NOTE — Progress Notes (Deleted)
Rechecked glucose reading is now 88.Marland KitchenMarland Kitchen

## 2014-09-21 NOTE — Progress Notes (Signed)
Assessment/Plan 1. Embolic CVA, delerium- Neurology seeing 2. Nstemi / Hx CAD w occluded RCA and stents to LAD/LCx- per Card 3. ESRD - HD TTS, for HD today off schedule.  Will keep even.  4. HTN/vol, now low BPs - on clon/ MTP at home.  5. A. Fib with slow VR improved -per Dr. Terrence Dupont  6. MSSA bacteremia  Subjective: Interval History: BP sl more stable  Objective: Vital signs in last 24 hours: Temp:  [97.3 F (36.3 C)-98.4 F (36.9 C)] 97.6 F (36.4 C) (12/11 1100) Pulse Rate:  [37-91] 62 (12/11 1100) Resp:  [12-31] 26 (12/11 1100) BP: (83-138)/(33-73) 119/43 mmHg (12/11 1100) SpO2:  [91 %-100 %] 94 % (12/11 1100) Arterial Line BP: (83)/(79) 83/79 mmHg (12/10 1300) Weight change:   Intake/Output from previous day: 12/10 0701 - 12/11 0700 In: 844.6 [I.V.:844.6] Out: -  Intake/Output this shift: Total I/O In: 65.4 [I.V.:65.4] Out: -   General appearance: alert Chest wall: no tenderness Cardio: irregularly irregular rhythm and systolic murmur: systolic ejection 2/6, crescendo and decrescendo at 2nd right intercostal space GI: soft, non-tender; bowel sounds normal; no masses,  no organomegaly Extremities: edema tr  Neurologic: confused, moving LUE less than right, following some commands, agitated  Lab Results:  Recent Labs  09/21/14 0420  WBC 15.3*  HGB 11.0*  HCT 33.7*  PLT 102*   BMET:  Recent Labs  09/20/14 0500 09/21/14 0420  NA 138 141  K 3.9 4.8  CL 100 100  CO2 19 20  GLUCOSE 179* 152*  BUN 54* 70*  CREATININE 6.35* 7.25*  CALCIUM 9.1 9.3   No results for input(s): PTH in the last 72 hours. Iron Studies: No results for input(s): IRON, TIBC, TRANSFERRIN, FERRITIN in the last 72 hours. Studies/Results: No results found.  Scheduled: . antiseptic oral rinse  7 mL Mouth Rinse q12n4p  . atorvastatin  40 mg Oral q1800  . calcium acetate  667 mg Oral TID WC  . chlorhexidine  15 mL Mouth Rinse BID  . feeding supplement (NEPRO CARB STEADY)   237 mL Oral BID BM  . multivitamin  1 tablet Oral QHS  . pantoprazole  40 mg Oral Daily  . sodium chloride  10-40 mL Intracatheter Q12H  . ticagrelor  60 mg Oral BID  . warfarin  3 mg Oral ONCE-1800  . Warfarin - Pharmacist Dosing Inpatient   Does not apply q1800     LOS: 6 days   Brad Mcintosh C 09/21/2014,11:31 AM

## 2014-09-21 NOTE — Plan of Care (Signed)
Problem: Consults Goal: Ischemic Stroke Patient Education See Patient Education Module for education specifics.  Outcome: Not Met (add Reason) Poor patient mentation, poor attention span

## 2014-09-21 NOTE — Progress Notes (Signed)
Ref: [name], MD   Subjective:  Easily awakens. Afebrile.   Objective:  Vital Signs in the last 24 hours: Temp:  [97.3 F (36.3 C)-98.4 F (36.9 C)] 97.7 F (36.5 C) (12/11 0700) Pulse Rate:  [32-92] 63 (12/11 0800) Cardiac Rhythm:  [-] Atrial fibrillation (12/11 0800) Resp:  [12-31] 31 (12/11 0800) BP: (96-138)/(36-73) 113/45 mmHg (12/11 0800) SpO2:  [94 %-100 %] 100 % (12/11 0800) Arterial Line BP: (83-123)/(58-79) 83/79 mmHg (12/10 1300)  Physical Exam: BP Readings from Last 1 Encounters:  09/21/14 113/45    Wt Readings from Last 1 Encounters:  09/18/14 87.7 kg (193 lb 5.5 oz)    Weight change:   HEENT: [place]/AT, Eyes-Brown, PERL, EOMI, Conjunctiva-Pink, Sclera-Non-icteric Neck: No JVD, No bruit, Trachea midline. Lungs:  Clear, Bilateral. Cardiac:  Irregular rhythm, normal S1 and S2, no S3. III/VI systolic murmur Abdomen:  Soft, non-tender. Extremities:  No edema present. No cyanosis. No clubbing. Left arm AVF. Left Internal Jugular subclavian line. Bilateral great toe amputation. CNS: AxOx2, Cranial nerves grossly intact, moves all 4 extremities. Right handed. Skin: Warm and dry.   Intake/Output from previous day: 12/10 0701 - 12/11 0700 In: 844.6 [I.V.:844.6] Out: -     Lab Results: BMET    Component Value Date/Time   NA 141 09/21/2014 0420   NA 138 09/20/2014 0500   NA 139 09/18/2014 0335   K 4.8 09/21/2014 0420   K 3.9 09/20/2014 0500   K 3.5* 09/18/2014 0335   CL 100 09/21/2014 0420   CL 100 09/20/2014 0500   CL 100 09/18/2014 0335   CO2 20 09/21/2014 0420   CO2 19 09/20/2014 0500   CO2 19 09/18/2014 0335   GLUCOSE 152* 09/21/2014 0420   GLUCOSE 179* 09/20/2014 0500   GLUCOSE 219* 09/18/2014 0335   BUN 70* 09/21/2014 0420   BUN 54* 09/20/2014 0500   BUN 52* 09/18/2014 0335   CREATININE 7.25* 09/21/2014 0420   CREATININE 6.35* 09/20/2014 0500   CREATININE 6.88* 09/18/2014 0335   CALCIUM 9.3 09/21/2014 0420   CALCIUM 9.1 09/20/2014 0500   CALCIUM 8.6 09/18/2014 0335   CALCIUM 7.8* 12/07/2009 0558   CALCIUM 8.7 10/25/2009 1301   CALCIUM 8.5 05/29/2009 0936   GFRNONAA 6* 09/21/2014 0420   GFRNONAA 8* 09/20/2014 0500   GFRNONAA 7* 09/18/2014 0335   GFRAA 7* 09/21/2014 0420   GFRAA 9* 09/20/2014 0500   GFRAA 8* 09/18/2014 0335   CBC    Component Value Date/Time   WBC 15.3* 09/21/2014 0420   RBC 3.82* 09/21/2014 0420   RBC 4.05* 10/21/2013 2230   HGB 11.0* 09/21/2014 0420   HCT 33.7* 09/21/2014 0420   PLT 102* 09/21/2014 0420   MCV 88.2 09/21/2014 0420   MCH 28.8 09/21/2014 0420   MCHC 32.6 09/21/2014 0420   RDW 20.0* 09/21/2014 0420   LYMPHSABS 1.1 09/21/2014 0420   MONOABS 0.5 09/21/2014 0420   EOSABS 0.0 09/21/2014 0420   BASOSABS 0.0 09/21/2014 0420   HEPATIC Function Panel  Recent Labs  10/21/13 1644 09/24/2014 0756 09/17/14 0030  PROT 5.4* 6.9 5.8*   HEMOGLOBIN A1C No components found for: HGA1C,  MPG CARDIAC ENZYMES Lab Results  Component Value Date   CKTOTAL 403* 12/09/2009   CKMB 4.9* 12/09/2009   TROPONINI 3.10* 09/17/2014   TROPONINI 2.59* 09/16/2014   TROPONINI 2.37* 09/16/2014   BNP No results for input(s): PROBNP in the last 8760 hours. TSH  Recent Labs  10/23/13 1135 09/26/2014 2257  TSH 1.730 1.100  CHOLESTEROL  Recent Labs  09/16/14 0527  CHOL 79    Scheduled Meds: . antiseptic oral rinse  7 mL Mouth Rinse q12n4p  . atorvastatin  40 mg Oral q1800  . calcium acetate  667 mg Oral TID WC  . chlorhexidine  15 mL Mouth Rinse BID  . feeding supplement (NEPRO CARB STEADY)  237 mL Oral BID BM  . multivitamin  1 tablet Oral QHS  . pantoprazole  40 mg Oral Daily  . sodium chloride  10-40 mL Intracatheter Q12H  . ticagrelor  60 mg Oral BID  . warfarin  3 mg Oral ONCE-1800  . Warfarin - Pharmacist Dosing Inpatient   Does not apply q1800   Continuous Infusions: . sodium chloride 10 mL/hr at 09/18/14 0800  . dextrose 5 % and 0.45% NaCl 25 mL/hr at 09/20/14 1900  . DOPamine  5 mcg/kg/min (09/21/14 0800)  . norepinephrine (LEVOPHED) Adult infusion Stopped (09/19/14 1800)   PRN Meds:.sodium chloride, acetaminophen, feeding supplement (NEPRO CARB STEADY), Gerhardt's butt cream, heparin, heparin, lidocaine (PF), lidocaine-prilocaine, loperamide, nitroGLYCERIN, ondansetron (ZOFRAN) IV, ondansetron, pentafluoroprop-tetrafluoroeth, senna-docusate, sodium chloride  Assessment/Plan: Small non-Q-wave myocardial infarction with atypical presentation Biventricular heart failure Confusion/mild left-sided weakness  Cardioembolic CVA Hypotensive shock multifactorial i.e. hypovolemia post dialysis/rule out sepsis Atrial fibrillation with controlled ventricular response CAD history of MI 2 in the past status post PCI to LAD and left circumflex in January 2015 Hypertension Diabetes mellitus, II Moderate aortic stenosis Peripheral vascular disease End-stage renal disease on hemodialysis Hypercholesteremia Anemia of chronic disease Tobacco abuse Marked leukocytosis probably secondary to steroids rule out sepsis History of gouty arthritis History of CA of prostate  Continue medical treatment. Decrease dopamine as tolerated.     LOS: 6 days    Dixie Dials  MD  09/21/2014, 9:21 AM

## 2014-09-22 LAB — PROTIME-INR
INR: 1.97 — AB (ref 0.00–1.49)
Prothrombin Time: 22.6 seconds — ABNORMAL HIGH (ref 11.6–15.2)

## 2014-09-22 MED ORDER — WARFARIN SODIUM 2 MG PO TABS
2.0000 mg | ORAL_TABLET | Freq: Once | ORAL | Status: AC
  Administered 2014-09-22: 2 mg via ORAL
  Filled 2014-09-22: qty 1

## 2014-09-22 NOTE — Progress Notes (Signed)
ANTICOAGULATION CONSULT NOTE - Follow Up Consult  Pharmacy Consult for coumadin, ancef Indication: afib/CVA, MSSA bacteremia  Allergies  Allergen Reactions  . Altace [Ramipril] Other (See Comments) and Cough    Also renal failure    Patient Measurements: Height: 6' (182.9 cm) Weight: 199 lb 8.3 oz (90.5 kg) IBW/kg (Calculated) : 77.6  Vital Signs: Temp: 97.8 F (36.6 C) (12/12 0402) Temp Source: Oral (12/12 0402) BP: 120/71 mmHg (12/12 0700) Pulse Rate: 68 (12/12 0700)  Labs:  Recent Labs  09/20/14 0500 09/21/14 0420 09/22/14 0524  HGB  --  11.0*  --   HCT  --  33.7*  --   PLT  --  102*  --   LABPROT 18.4* 19.5* 22.6*  INR 1.51* 1.64* 1.97*  CREATININE 6.35* 7.25*  --     Estimated Creatinine Clearance: 9.4 mL/min (by C-G formula based on Cr of 7.25).   Medications:  Scheduled:  . antiseptic oral rinse  7 mL Mouth Rinse q12n4p  . atorvastatin  40 mg Oral q1800  . calcium acetate  667 mg Oral TID WC  . chlorhexidine  15 mL Mouth Rinse BID  . feeding supplement (NEPRO CARB STEADY)  237 mL Oral BID BM  . multivitamin  1 tablet Oral QHS  . pantoprazole  40 mg Oral Daily  . sodium chloride  10-40 mL Intracatheter Q12H  . ticagrelor  60 mg Oral BID  . warfarin  2 mg Oral ONCE-1800  . Warfarin - Pharmacist Dosing Inpatient   Does not apply q1800    Assessment: 79 yo male with NSTEMI and embolic CVA with afib to begin coumadin. Patient was on heparin 12/8 and no further plans for heparin per Dr. Doylene Canard.  Aspirin to be discontinued and Brilinta to change to 60mg  po bid per Dr. Merrilee Jansky request in setting of stent placed 11/09/2013 and hx of MI x2.    INR noted 2.89 on 12/6 (no coumadin PTA) 12/7 AST/ALT= 30/24, t bili= 2.3 and albumin= 2.3 12/12 INR 1.97  He is also on ancef for MSSA bacteremia doses for ESRD. HD has been TTS - held 12/10 received HD 12/11 and a dose of ancef after.  Antibiotic treatment duration of 42 days per ID unless goals of care change  (palliative care has been consulted).   Goal of Therapy:  INR 2-3 Monitor platelets by anticoagulation protocol: Yes   Plan:   -Coumadin 2mg  po x1  -Daily PT/INR -Ancef 2gm after HD  based on HD plans  Bonnita Nasuti Pharm.D. CPP, BCPS Clinical Pharmacist [phone] 09/22/2014 8:23 AM

## 2014-09-22 NOTE — Progress Notes (Signed)
Ref: [name], MD   Subjective:  Remains afebrile. Less confused. On 4-5 mcg of dopamine support. Appreciate renal assistance.  Objective:  Vital Signs in the last 24 hours: Temp:  [97.5 F (36.4 C)-98.2 F (36.8 C)] 97.7 F (36.5 C) (12/12 0800) Pulse Rate:  [30-99] 66 (12/12 0800) Cardiac Rhythm:  [-] Atrial fibrillation (12/12 0855) Resp:  [9-35] 30 (12/12 0800) BP: (83-139)/(33-95) 125/47 mmHg (12/12 0800) SpO2:  [90 %-100 %] 98 % (12/12 0800) Weight:  [90.5 kg (199 lb 8.3 oz)] 90.5 kg (199 lb 8.3 oz) (12/11 1650)  Physical Exam: BP Readings from Last 1 Encounters:  09/22/14 125/47    Wt Readings from Last 1 Encounters:  09/21/14 90.5 kg (199 lb 8.3 oz)    Weight change:   HEENT: [place]/AT, Eyes-Brown, PERL, EOMI, Conjunctiva-Pink, Sclera-Non-icteric Neck: No JVD, No bruit, Trachea midline. Lungs:  Clear, Bilateral. Cardiac:  Regular rhythm, normal S1 and S2, no S3. III/VI systolic murmur. Abdomen:  Soft, non-tender. Extremities:  No edema present. No cyanosis. No clubbing. Left arm AVF. Left Internal Jugular subclavian line. Bilateral great toe amputation. CNS: AxOx2, Cranial nerves grossly intact, moves all 4 extremities. Right handed. Skin: Warm and dry.   Intake/Output from previous day: 12/11 0701 - 12/12 0700 In: 1302.1 [I.V.:1252.1; IV Piggyback:50] Out: 0     Lab Results: BMET    Component Value Date/Time   NA 141 09/21/2014 0420   NA 138 09/20/2014 0500   NA 139 09/18/2014 0335   K 4.8 09/21/2014 0420   K 3.9 09/20/2014 0500   K 3.5* 09/18/2014 0335   CL 100 09/21/2014 0420   CL 100 09/20/2014 0500   CL 100 09/18/2014 0335   CO2 20 09/21/2014 0420   CO2 19 09/20/2014 0500   CO2 19 09/18/2014 0335   GLUCOSE 152* 09/21/2014 0420   GLUCOSE 179* 09/20/2014 0500   GLUCOSE 219* 09/18/2014 0335   BUN 70* 09/21/2014 0420   BUN 54* 09/20/2014 0500   BUN 52* 09/18/2014 0335   CREATININE 7.25* 09/21/2014 0420   CREATININE 6.35* 09/20/2014 0500   CREATININE 6.88* 09/18/2014 0335   CALCIUM 9.3 09/21/2014 0420   CALCIUM 9.1 09/20/2014 0500   CALCIUM 8.6 09/18/2014 0335   CALCIUM 7.8* 12/07/2009 0558   CALCIUM 8.7 10/25/2009 1301   CALCIUM 8.5 05/29/2009 0936   GFRNONAA 6* 09/21/2014 0420   GFRNONAA 8* 09/20/2014 0500   GFRNONAA 7* 09/18/2014 0335   GFRAA 7* 09/21/2014 0420   GFRAA 9* 09/20/2014 0500   GFRAA 8* 09/18/2014 0335   CBC    Component Value Date/Time   WBC 15.3* 09/21/2014 0420   RBC 3.82* 09/21/2014 0420   RBC 4.05* 10/21/2013 2230   HGB 11.0* 09/21/2014 0420   HCT 33.7* 09/21/2014 0420   PLT 102* 09/21/2014 0420   MCV 88.2 09/21/2014 0420   MCH 28.8 09/21/2014 0420   MCHC 32.6 09/21/2014 0420   RDW 20.0* 09/21/2014 0420   LYMPHSABS 1.1 09/21/2014 0420   MONOABS 0.5 09/21/2014 0420   EOSABS 0.0 09/21/2014 0420   BASOSABS 0.0 09/21/2014 0420   HEPATIC Function Panel  Recent Labs  10/21/13 1644 10/03/2014 0756 09/17/14 0030  PROT 5.4* 6.9 5.8*   HEMOGLOBIN A1C No components found for: HGA1C,  MPG CARDIAC ENZYMES Lab Results  Component Value Date   CKTOTAL 403* 12/09/2009   CKMB 4.9* 12/09/2009   TROPONINI 3.10* 09/17/2014   TROPONINI 2.59* 09/16/2014   TROPONINI 2.37* 09/16/2014   BNP No results for input(s): PROBNP  in the last 8760 hours. TSH  Recent Labs  10/23/13 1135 09/14/2014 2257  TSH 1.730 1.100   CHOLESTEROL  Recent Labs  09/16/14 0527  CHOL 79    Scheduled Meds: . antiseptic oral rinse  7 mL Mouth Rinse q12n4p  . atorvastatin  40 mg Oral q1800  . calcium acetate  667 mg Oral TID WC  . chlorhexidine  15 mL Mouth Rinse BID  . feeding supplement (NEPRO CARB STEADY)  237 mL Oral BID BM  . multivitamin  1 tablet Oral QHS  . pantoprazole  40 mg Oral Daily  . sodium chloride  10-40 mL Intracatheter Q12H  . ticagrelor  60 mg Oral BID  . warfarin  2 mg Oral ONCE-1800  . Warfarin - Pharmacist Dosing Inpatient   Does not apply q1800   Continuous Infusions: . sodium chloride  50 mL/hr at 09/22/14 0252  . dextrose 5 % and 0.45% NaCl 25 mL/hr at 09/21/14 2000  . DOPamine 4 mcg/kg/min (09/22/14 0900)   PRN Meds:.sodium chloride, acetaminophen, feeding supplement (NEPRO CARB STEADY), Gerhardt's butt cream, heparin, heparin, lidocaine (PF), lidocaine-prilocaine, loperamide, nitroGLYCERIN, ondansetron (ZOFRAN) IV, ondansetron, pentafluoroprop-tetrafluoroeth, senna-docusate, sodium chloride  Assessment/Plan: Small non-Q-wave myocardial infarction with atypical presentation Biventricular heart failure Confusion/mild left-sided weakness  Cardioembolic CVA Hypotensive shock multifactorial i.e. hypovolemia post dialysis/rule out sepsis Atrial fibrillation with controlled ventricular response CAD history of MI 2 in the past status post PCI to LAD and left circumflex in January 2015 Hypertension Diabetes mellitus, II Moderate aortic stenosis Peripheral vascular disease End-stage renal disease on hemodialysis Hypercholesteremia Anemia of chronic disease Tobacco abuse Marked leukocytosis probably secondary to steroids rule out sepsis History of gouty arthritis History of CA of prostate  Continue medical treatment.     LOS: 7 days    Dixie Dials  MD  09/22/2014, 9:35 AM

## 2014-09-22 NOTE — Progress Notes (Signed)
  Assessment/Plan 1. Embolic CVA, delirium improving- Neurology seeing 2. Nstemi / Hx CAD w occluded RCA and stents to LAD/LCx- per Cardiology 3. ESRD - HD TTS, last HD Friday.   4. HTN/vol, now low BPs - on clon/ MTP at home.  5. A. Fib with slow VR improved -per Dr. Cardiology  6. MSSA bacteremia   Subjective: Interval History: Tolerated dialysis yesterday, kept even  Objective: Vital signs in last 24 hours: Temp:  [97.5 F (36.4 C)-98.2 F (36.8 C)] 97.7 F (36.5 C) (12/12 0800) Pulse Rate:  [30-99] 66 (12/12 0800) Resp:  [9-35] 30 (12/12 0800) BP: (83-139)/(33-95) 125/47 mmHg (12/12 0800) SpO2:  [90 %-100 %] 98 % (12/12 0800) Weight:  [90.5 kg (199 lb 8.3 oz)] 90.5 kg (199 lb 8.3 oz) (12/11 1650) Weight change:   Intake/Output from previous day: 12/11 0701 - 12/12 0700 In: 1302.1 [I.V.:1252.1; IV Piggyback:50] Out: 0  Intake/Output this shift: Total I/O In: 165.4 [I.V.:165.4] Out: -   General appearance: alert and cooperative Resp: clear to auscultation bilaterally Chest wall: no tenderness Cardio: irregularly irregular rhythm Extremities: edema tr  Oriented to [facility], called me Dr. Doylene Canard, following commands  Lab Results:  Recent Labs  09/21/14 0420  WBC 15.3*  HGB 11.0*  HCT 33.7*  PLT 102*   BMET:  Recent Labs  09/20/14 0500 09/21/14 0420  NA 138 141  K 3.9 4.8  CL 100 100  CO2 19 20  GLUCOSE 179* 152*  BUN 54* 70*  CREATININE 6.35* 7.25*  CALCIUM 9.1 9.3   No results for input(s): PTH in the last 72 hours. Iron Studies: No results for input(s): IRON, TIBC, TRANSFERRIN, FERRITIN in the last 72 hours. Studies/Results: No results found.  Scheduled: . antiseptic oral rinse  7 mL Mouth Rinse q12n4p  . atorvastatin  40 mg Oral q1800  . calcium acetate  667 mg Oral TID WC  . chlorhexidine  15 mL Mouth Rinse BID  . feeding supplement (NEPRO CARB STEADY)  237 mL Oral BID BM  . multivitamin  1 tablet Oral QHS  . pantoprazole  40 mg  Oral Daily  . sodium chloride  10-40 mL Intracatheter Q12H  . ticagrelor  60 mg Oral BID  . warfarin  2 mg Oral ONCE-1800  . Warfarin - Pharmacist Dosing Inpatient   Does not apply q1800     LOS: 7 days   Johni Narine C 09/22/2014,9:37 AM

## 2014-09-22 NOTE — Progress Notes (Signed)
Speech Language Pathology Treatment: Dysphagia  Patient Details Name: Brad Mcintosh MRN: [id number] DOB: 09-08-1935 Today's Date: 09/22/2014 Time: 4332-9518 SLP Time Calculation (min) (ACUTE ONLY): 21 min  Assessment / Plan / Recommendation Clinical Impression  Pt continues with poor participation, decreased ability to follow commands impacting ability to consume po intake.  Pt expressed desire for po intake however SLP observed him demonstrate increased work of breathing with irregular rate up to high 20's and decreased HR.  Patient did accept small bite of applesauce and single ice chip bolus- no overt s/s of aspiration however his inconsistent vitals, mental status and decreased participation/command following will increase aspiration risk.  SLP questions if pt would benefit from considering short term alternative means of nutrition dependent on medical goals.    RN reports pt tolerating medicine with puree - therefore recommend to continue this plan and otherwise npo with moisture via toothette to maximize oral hygiene.  Concern for swallowing ability to return to functional level present.    HPI HPI: 79 y.o. male with history of A fib, hypertension, ESRD on HD presenting with left sided weakness and confusion found to have an elevated troponin (2.33) in the ED. Admitted for NSTEMI.  Transferred to CCU 12/5 because of hypotension.  Pt npo except medicine with puree.     Pertinent Vitals Pain Assessment: No/denies pain  SLP Plan   (mbs when pt able to participate)    Recommendations Diet recommendations: NPO (except medicine with puree) Medication Administration: Crushed with puree Supervision: Full supervision/cueing for compensatory strategies Compensations: Slow rate;Small sips/bites Postural Changes and/or Swallow Maneuvers: Seated upright 90 degrees              Oral Care Recommendations: Oral care BID Follow up Recommendations: Skilled Nursing facility Plan:  (mbs when pt able to  participate)    [place], [place] SLP [phone]

## 2014-09-23 LAB — CULTURE, BLOOD (ROUTINE X 2): Culture: NO GROWTH

## 2014-09-23 LAB — PROTIME-INR
INR: 2.36 — ABNORMAL HIGH (ref 0.00–1.49)
Prothrombin Time: 26 seconds — ABNORMAL HIGH (ref 11.6–15.2)

## 2014-09-23 MED ORDER — LORAZEPAM 2 MG/ML IJ SOLN
0.2500 mg | INTRAMUSCULAR | Status: DC | PRN
  Administered 2014-09-24 (×2): 0.25 mg via INTRAVENOUS
  Filled 2014-09-23 (×2): qty 1

## 2014-09-23 MED ORDER — WARFARIN SODIUM 2 MG PO TABS
2.0000 mg | ORAL_TABLET | Freq: Once | ORAL | Status: AC
  Administered 2014-09-23: 2 mg via ORAL
  Filled 2014-09-23: qty 1

## 2014-09-23 MED ORDER — LORAZEPAM 2 MG/ML IJ SOLN
0.5000 mg | INTRAMUSCULAR | Status: AC | PRN
  Administered 2014-09-23 (×2): 0.5 mg via INTRAVENOUS
  Filled 2014-09-23 (×2): qty 1

## 2014-09-23 NOTE — Progress Notes (Signed)
PARENTERAL NUTRITION CONSULT NOTE - INITIAL   Assessment: - Received consult to start TPN due to intolerance to enteral nutrition; however, TF has not been tried based on documentation.   Plan: - Consider a trial of enteral nutrition prior to starting TPN as patient's GI tract is intact.  F/U in AM. - CMET, Mag, Phos in AM    Brad Mcintosh D. Mina Marble, PharmD, BCPS Pager:  [phone] 09/23/2014, 6:33 PM

## 2014-09-23 NOTE — Progress Notes (Signed)
ANTICOAGULATION CONSULT NOTE - Follow Up Consult  Pharmacy Consult for coumadin, ancef Indication: afib/CVA, MSSA bacteremia  Allergies  Allergen Reactions  . Altace [Ramipril] Other (See Comments) and Cough    Also renal failure    Patient Measurements: Height: 6' (182.9 cm) Weight: 199 lb 8.3 oz (90.5 kg) IBW/kg (Calculated) : 77.6  Vital Signs: Temp: 97.8 F (36.6 C) (12/13 0700) Temp Source: Oral (12/13 0700) BP: 99/51 mmHg (12/13 1100) Pulse Rate: 39 (12/13 1100)  Labs:  Recent Labs  09/21/14 0420 09/22/14 0524 09/23/14 0430  HGB 11.0*  --   --   HCT 33.7*  --   --   PLT 102*  --   --   LABPROT 19.5* 22.6* 26.0*  INR 1.64* 1.97* 2.36*  CREATININE 7.25*  --   --     Estimated Creatinine Clearance: 9.4 mL/min (by C-G formula based on Cr of 7.25).   Medications:  Scheduled:  . antiseptic oral rinse  7 mL Mouth Rinse q12n4p  . atorvastatin  40 mg Oral q1800  . calcium acetate  667 mg Oral TID WC  . chlorhexidine  15 mL Mouth Rinse BID  . feeding supplement (NEPRO CARB STEADY)  237 mL Oral BID BM  . multivitamin  1 tablet Oral QHS  . pantoprazole  40 mg Oral Daily  . sodium chloride  10-40 mL Intracatheter Q12H  . ticagrelor  60 mg Oral BID  . warfarin  2 mg Oral ONCE-1800  . Warfarin - Pharmacist Dosing Inpatient   Does not apply q1800    Assessment: 79 yo male with NSTEMI and embolic CVA with afib to begin coumadin. Patient was on heparin 12/8 and no further plans for heparin per Dr. Doylene Canard.  Aspirin to be discontinued and Brilinta to change to 60mg  po bid per Dr. Merrilee Jansky request in setting of stent placed 11/09/2013 and hx of MI x2.    INR noted 2.89 on 12/6 (no coumadin PTA) 12/7 AST/ALT= 30/24, t bili= 2.3 and albumin= 2.3 12/12 INR 2.36  He is also on ancef for MSSA bacteremia doses for ESRD. HD has been TTS - held 12/10 received HD 12/11 and a dose of ancef after - per renal noe next HD 12/14.  Antibiotic treatment duration of 42 days per ID  unless goals of care change (palliative care has been consulted).   Goal of Therapy:  INR 2-3 Monitor platelets by anticoagulation protocol: Yes   Plan:   -Coumadin 2mg  po x1  -Daily PT/INR -Ancef 2gm after HD  based on HD plans  Bonnita Nasuti Pharm.D. CPP, BCPS Clinical Pharmacist [phone] 09/23/2014 12:26 PM

## 2014-09-23 NOTE — Progress Notes (Signed)
Patient is extremely agitated and respiratory rate is in the 40-50's.  O2 sats in 80's.  Patient is yelling into hallway.  Will not take medications by mouth.  Prn ativan given per order and Dr. Doylene Canard notified.  Dr. Doylene Canard to come see patient.  Will continue to monitor.

## 2014-09-23 NOTE — Progress Notes (Signed)
Ref: [name], MD   Subjective:  Awake earlier asking for food. Now sedated post 0.5 mg. of IV lorazepam use. Off pressure agents.  Objective:  Vital Signs in the last 24 hours: Temp:  [97.4 F (36.3 C)-97.8 F (36.6 C)] 97.8 F (36.6 C) (12/13 0700) Pulse Rate:  [51-85] 84 (12/13 0900) Cardiac Rhythm:  [-] Atrial fibrillation (12/13 0730) Resp:  [13-47] 47 (12/13 0900) BP: (95-145)/(31-95) 108/41 mmHg (12/13 0900) SpO2:  [90 %-100 %] 100 % (12/13 0900)  Physical Exam: BP Readings from Last 1 Encounters:  09/23/14 108/41    Wt Readings from Last 1 Encounters:  09/21/14 90.5 kg (199 lb 8.3 oz)    Weight change:   HEENT: /AT, Eyes-Brown, PERL, EOMI, Conjunctiva-Pink, Sclera-Non-icteric Neck: No JVD, No bruit, Trachea midline. Lungs:  Clear, Bilateral. Cardiac:  Regular rhythm, normal S1 and S2, no S3. III/VI systolic murmur. Abdomen:  Soft, non-tender. Extremities:  No edema present. No cyanosis. No clubbing.Left arm AVF. Left Internal Jugular subclavian line. Bilateral great toe amputation. CNS: AxOx0, Cranial nerves grossly intact, moves all 4 extremities. Right handed. Skin: Warm and dry.   Intake/Output from previous day: 12/12 0701 - 12/13 0700 In: 1929.3 [I.V.:1929.3] Out: -     Lab Results: BMET    Component Value Date/Time   NA 141 09/21/2014 0420   NA 138 09/20/2014 0500   NA 139 09/18/2014 0335   K 4.8 09/21/2014 0420   K 3.9 09/20/2014 0500   K 3.5* 09/18/2014 0335   CL 100 09/21/2014 0420   CL 100 09/20/2014 0500   CL 100 09/18/2014 0335   CO2 20 09/21/2014 0420   CO2 19 09/20/2014 0500   CO2 19 09/18/2014 0335   GLUCOSE 152* 09/21/2014 0420   GLUCOSE 179* 09/20/2014 0500   GLUCOSE 219* 09/18/2014 0335   BUN 70* 09/21/2014 0420   BUN 54* 09/20/2014 0500   BUN 52* 09/18/2014 0335   CREATININE 7.25* 09/21/2014 0420   CREATININE 6.35* 09/20/2014 0500   CREATININE 6.88* 09/18/2014 0335   CALCIUM 9.3 09/21/2014 0420   CALCIUM 9.1  09/20/2014 0500   CALCIUM 8.6 09/18/2014 0335   CALCIUM 7.8* 12/07/2009 0558   CALCIUM 8.7 10/25/2009 1301   CALCIUM 8.5 05/29/2009 0936   GFRNONAA 6* 09/21/2014 0420   GFRNONAA 8* 09/20/2014 0500   GFRNONAA 7* 09/18/2014 0335   GFRAA 7* 09/21/2014 0420   GFRAA 9* 09/20/2014 0500   GFRAA 8* 09/18/2014 0335   CBC    Component Value Date/Time   WBC 15.3* 09/21/2014 0420   RBC 3.82* 09/21/2014 0420   RBC 4.05* 10/21/2013 2230   HGB 11.0* 09/21/2014 0420   HCT 33.7* 09/21/2014 0420   PLT 102* 09/21/2014 0420   MCV 88.2 09/21/2014 0420   MCH 28.8 09/21/2014 0420   MCHC 32.6 09/21/2014 0420   RDW 20.0* 09/21/2014 0420   LYMPHSABS 1.1 09/21/2014 0420   MONOABS 0.5 09/21/2014 0420   EOSABS 0.0 09/21/2014 0420   BASOSABS 0.0 09/21/2014 0420   HEPATIC Function Panel  Recent Labs  10/21/13 1644 09/14/2014 0756 09/17/14 0030  PROT 5.4* 6.9 5.8*   HEMOGLOBIN A1C No components found for: HGA1C,  MPG CARDIAC ENZYMES Lab Results  Component Value Date   CKTOTAL 403* 12/09/2009   CKMB 4.9* 12/09/2009   TROPONINI 3.10* 09/17/2014   TROPONINI 2.59* 09/16/2014   TROPONINI 2.37* 09/16/2014   BNP No results for input(s): PROBNP in the last 8760 hours. TSH  Recent Labs  10/23/13 1135 09/14/2014 2257  TSH 1.730 1.100   CHOLESTEROL  Recent Labs  09/16/14 0527  CHOL 79    Scheduled Meds: . antiseptic oral rinse  7 mL Mouth Rinse q12n4p  . atorvastatin  40 mg Oral q1800  . calcium acetate  667 mg Oral TID WC  . chlorhexidine  15 mL Mouth Rinse BID  . feeding supplement (NEPRO CARB STEADY)  237 mL Oral BID BM  . multivitamin  1 tablet Oral QHS  . pantoprazole  40 mg Oral Daily  . sodium chloride  10-40 mL Intracatheter Q12H  . ticagrelor  60 mg Oral BID  . Warfarin - Pharmacist Dosing Inpatient   Does not apply q1800   Continuous Infusions: . sodium chloride 50 mL/hr at 09/23/14 0700  . dextrose 5 % and 0.45% NaCl 25 mL/hr at 09/23/14 0700  . DOPamine Stopped  (09/22/14 1812)   PRN Meds:.sodium chloride, acetaminophen, feeding supplement (NEPRO CARB STEADY), Gerhardt's butt cream, heparin, heparin, lidocaine (PF), lidocaine-prilocaine, loperamide, LORazepam, nitroGLYCERIN, ondansetron (ZOFRAN) IV, ondansetron, pentafluoroprop-tetrafluoroeth, senna-docusate, sodium chloride  Assessment/Plan: Small non-Q-wave myocardial infarction with atypical presentation Biventricular heart failure Confusion/mild left-sided weakness  Cardioembolic CVA Hypotensive shock multifactorial i.e. hypovolemia post dialysis/rule out sepsis Atrial fibrillation with controlled ventricular response CAD history of MI 2 in the past status post PCI to LAD and left circumflex in January 2015 Hypertension Diabetes mellitus, II Moderate aortic stenosis Peripheral vascular disease End-stage renal disease on hemodialysis Hypercholesteremia Anemia of chronic disease Tobacco abuse Marked leukocytosis probably secondary to steroids rule out sepsis History of gouty arthritis History of CA of prostate  Resume nectar thick oral feeding when awake. Change IV fluid to D 5&1/2 NS at 50 cc/hr.     LOS: 8 days    Dixie Dials  MD  09/23/2014, 11:21 AM

## 2014-09-23 NOTE — Progress Notes (Signed)
Assessment/Plan 1. Embolic CVA, delirium improving- Neurology seeing 2. Nstemi / Hx CAD w occluded RCA and stents to LAD/LCx- per Cardiology 3. ESRD - HD TTS, last HD Friday. Off schedule. Next tx Monday.  4. HTN/vol, now low BPs but improving - on clon/ MTP at home.  5. A. Fib with slow VR improved -per Dr. Cardiology  6. MSSA bacteremia   Subjective: Interval History: agitated  Objective: Vital signs in last 24 hours: Temp:  [97.4 F (36.3 C)-97.8 F (36.6 C)] 97.8 F (36.6 C) (12/13 0700) Pulse Rate:  [51-85] 84 (12/13 0900) Resp:  [13-47] 47 (12/13 0900) BP: (95-145)/(31-95) 108/41 mmHg (12/13 0900) SpO2:  [90 %-100 %] 100 % (12/13 0900) Weight change:   Intake/Output from previous day: 12/12 0701 - 12/13 0700 In: 1929.3 [I.V.:1929.3] Out: -  Intake/Output this shift: Total I/O In: 465 [P.O.:240; I.V.:225] Out: -   General appearance: alert and cooperative Resp: clear to auscultation bilaterally Chest wall: no tenderness Cardio: irregularly irregular rhythm GI: soft, non-tender; bowel sounds normal; no masses,  no organomegaly Extremities: extremities normal, atraumatic, no cyanosis or edema  Lab Results:  Recent Labs  09/21/14 0420  WBC 15.3*  HGB 11.0*  HCT 33.7*  PLT 102*   BMET:  Recent Labs  09/21/14 0420  NA 141  K 4.8  CL 100  CO2 20  GLUCOSE 152*  BUN 70*  CREATININE 7.25*  CALCIUM 9.3   No results for input(s): PTH in the last 72 hours. Iron Studies: No results for input(s): IRON, TIBC, TRANSFERRIN, FERRITIN in the last 72 hours. Studies/Results: No results found.  Scheduled: . antiseptic oral rinse  7 mL Mouth Rinse q12n4p  . atorvastatin  40 mg Oral q1800  . calcium acetate  667 mg Oral TID WC  . chlorhexidine  15 mL Mouth Rinse BID  . feeding supplement (NEPRO CARB STEADY)  237 mL Oral BID BM  . multivitamin  1 tablet Oral QHS  . pantoprazole  40 mg Oral Daily  . sodium chloride  10-40 mL Intracatheter Q12H  .  ticagrelor  60 mg Oral BID  . Warfarin - Pharmacist Dosing Inpatient   Does not apply q1800     LOS: 8 days   Amarianna Abplanalp C 09/23/2014,9:56 AM

## 2014-09-24 ENCOUNTER — Inpatient Hospital Stay (HOSPITAL_COMMUNITY): Payer: Medicare Other

## 2014-09-24 DIAGNOSIS — G9340 Encephalopathy, unspecified: Secondary | ICD-10-CM | POA: Insufficient documentation

## 2014-09-24 LAB — COMPREHENSIVE METABOLIC PANEL
ALT: <5 U/L (ref 0–53)
AST: 20 U/L (ref 0–37)
Albumin: 2.2 g/dL — ABNORMAL LOW (ref 3.5–5.2)
Alkaline Phosphatase: 70 U/L (ref 39–117)
Anion gap: 21 — ABNORMAL HIGH (ref 5–15)
BUN: 62 mg/dL — ABNORMAL HIGH (ref 6–23)
CO2: 20 mEq/L (ref 19–32)
Calcium: 8.6 mg/dL (ref 8.4–10.5)
Chloride: 100 mEq/L (ref 96–112)
Creatinine, Ser: 6.91 mg/dL — ABNORMAL HIGH (ref 0.50–1.35)
GFR calc Af Amer: 8 mL/min — ABNORMAL LOW (ref >90)
GFR calc non Af Amer: 7 mL/min — ABNORMAL LOW (ref >90)
Glucose, Bld: 148 mg/dL — ABNORMAL HIGH (ref 70–99)
Potassium: 4.4 mEq/L (ref 3.7–5.3)
Sodium: 141 mEq/L (ref 137–147)
Total Bilirubin: 0.8 mg/dL (ref 0.3–1.2)
Total Protein: 6.1 g/dL (ref 6.0–8.3)

## 2014-09-24 LAB — GLUCOSE, CAPILLARY
GLUCOSE-CAPILLARY: 122 mg/dL — AB (ref 70–99)
GLUCOSE-CAPILLARY: 109 mg/dL — AB (ref 70–99)
Glucose-Capillary: 120 mg/dL — ABNORMAL HIGH (ref 70–99)
Glucose-Capillary: 127 mg/dL — ABNORMAL HIGH (ref 70–99)
Glucose-Capillary: 134 mg/dL — ABNORMAL HIGH (ref 70–99)
Glucose-Capillary: 142 mg/dL — ABNORMAL HIGH (ref 70–99)

## 2014-09-24 LAB — PROTIME-INR
INR: 2.6 — ABNORMAL HIGH (ref 0.00–1.49)
Prothrombin Time: 28.1 seconds — ABNORMAL HIGH (ref 11.6–15.2)

## 2014-09-24 LAB — MAGNESIUM: Magnesium: 2.3 mg/dL (ref 1.5–2.5)

## 2014-09-24 LAB — PHOSPHORUS: Phosphorus: 5.5 mg/dL — ABNORMAL HIGH (ref 2.3–4.6)

## 2014-09-24 MED ORDER — ACETAMINOPHEN 325 MG PO TABS
650.0000 mg | ORAL_TABLET | Freq: Four times a day (QID) | ORAL | Status: DC | PRN
  Administered 2014-09-26: 650 mg via ORAL
  Filled 2014-09-24: qty 2

## 2014-09-24 MED ORDER — NEPRO/CARBSTEADY PO LIQD: 237.0000 mL | ORAL | Status: DC | PRN

## 2014-09-24 MED ORDER — NEPRO/CARBSTEADY PO LIQD
1000.0000 mL | ORAL | Status: DC
  Administered 2014-09-24 – 2014-09-26 (×2): 1000 mL
  Filled 2014-09-24 (×5): qty 1000

## 2014-09-24 MED ORDER — PRO-STAT SUGAR FREE PO LIQD
30.0000 mL | Freq: Three times a day (TID) | ORAL | Status: DC
  Filled 2014-09-24 (×2): qty 30

## 2014-09-24 MED ORDER — PENTAFLUOROPROP-TETRAFLUOROETH EX AERO: 1.0000 "application " | INHALATION_SPRAY | CUTANEOUS | Status: DC | PRN

## 2014-09-24 MED ORDER — NEPRO/CARBSTEADY PO LIQD
237.0000 mL | ORAL | Status: DC | PRN
  Filled 2014-09-24: qty 237

## 2014-09-24 MED ORDER — SODIUM CHLORIDE 0.9 % IV SOLN: 100.0000 mL | INTRAVENOUS | Status: DC | PRN

## 2014-09-24 MED ORDER — NEPRO/CARBSTEADY PO LIQD
237.0000 mL | Freq: Three times a day (TID) | ORAL | Status: DC
  Filled 2014-09-24 (×5): qty 237

## 2014-09-24 MED ORDER — LIDOCAINE-PRILOCAINE 2.5-2.5 % EX CREA
1.0000 "application " | TOPICAL_CREAM | CUTANEOUS | Status: DC | PRN
  Filled 2014-09-24: qty 5

## 2014-09-24 MED ORDER — HEPARIN SODIUM (PORCINE) 1000 UNIT/ML DIALYSIS: 1000.0000 [IU] | INTRAMUSCULAR | Status: DC | PRN

## 2014-09-24 MED ORDER — HEPARIN SODIUM (PORCINE) 1000 UNIT/ML DIALYSIS: 20.0000 [IU]/kg | INTRAMUSCULAR | Status: DC | PRN

## 2014-09-24 MED ORDER — VITAL HIGH PROTEIN PO LIQD: 1000.0000 mL | ORAL | Status: DC

## 2014-09-24 MED ORDER — LIDOCAINE-PRILOCAINE 2.5-2.5 % EX CREA: 1.0000 "application " | TOPICAL_CREAM | CUTANEOUS | Status: DC | PRN

## 2014-09-24 MED ORDER — ALTEPLASE 2 MG IJ SOLR
2.0000 mg | Freq: Once | INTRAMUSCULAR | Status: AC | PRN
  Filled 2014-09-24: qty 2

## 2014-09-24 MED ORDER — ALTEPLASE 2 MG IJ SOLR: 2.0000 mg | Freq: Once | INTRAMUSCULAR | Status: DC | PRN

## 2014-09-24 MED ORDER — NEPRO/CARBSTEADY PO LIQD
1000.0000 mL | ORAL | Status: DC
  Filled 2014-09-24 (×2): qty 1000

## 2014-09-24 MED ORDER — WARFARIN SODIUM 2 MG PO TABS
2.0000 mg | ORAL_TABLET | Freq: Once | ORAL | Status: AC
Start: 2014-09-24 — End: 2014-09-24
  Administered 2014-09-24: 2 mg via ORAL
  Filled 2014-09-24 (×2): qty 1

## 2014-09-24 MED ORDER — ACETAMINOPHEN 160 MG/5ML PO SOLN
650.0000 mg | Freq: Four times a day (QID) | ORAL | Status: DC | PRN
  Administered 2014-09-24 – 2014-09-25 (×3): 650 mg
  Filled 2014-09-24 (×3): qty 20.3

## 2014-09-24 MED ORDER — PRO-STAT SUGAR FREE PO LIQD
30.0000 mL | Freq: Three times a day (TID) | ORAL | Status: DC
  Administered 2014-09-24 – 2014-09-27 (×8): 30 mL
  Filled 2014-09-24 (×14): qty 30

## 2014-09-24 MED ORDER — CEFAZOLIN SODIUM-DEXTROSE 2-3 GM-% IV SOLR
2.0000 g | Freq: Once | INTRAVENOUS | Status: AC
  Administered 2014-09-24: 2 g via INTRAVENOUS
  Filled 2014-09-24: qty 50

## 2014-09-24 MED ORDER — LIDOCAINE HCL (PF) 1 % IJ SOLN: 5.0000 mL | INTRAMUSCULAR | Status: DC | PRN

## 2014-09-24 NOTE — Progress Notes (Signed)
UR Completed.  Vergie Living [phone] 09/24/2014

## 2014-09-24 NOTE — Progress Notes (Signed)
Patient ID:Brad Mcintosh      DOB: 1935/05/15      PPJ:[id number]   Palliative Medicine Team at [facility] Progress Note    Subjective: More confused today. Moans but states nothing bothering him. Unable to provide any meaningful history    Filed Vitals:   09/24/14 1400  BP: 119/39  Pulse: 62  Temp:   Resp: 28   Physical exam: General: very confused Chest: CTAB CVS: irregular Abdomen: soft, NT, ND Ext: cool distal ext  CBC    Component Value Date/Time   WBC 15.3* 09/21/2014 0420   RBC 3.82* 09/21/2014 0420   RBC 4.05* 10/21/2013 2230   HGB 11.0* 09/21/2014 0420   HCT 33.7* 09/21/2014 0420   PLT 102* 09/21/2014 0420   MCV 88.2 09/21/2014 0420   MCH 28.8 09/21/2014 0420   MCHC 32.6 09/21/2014 0420   RDW 20.0* 09/21/2014 0420   LYMPHSABS 1.1 09/21/2014 0420   MONOABS 0.5 09/21/2014 0420   EOSABS 0.0 09/21/2014 0420   BASOSABS 0.0 09/21/2014 0420    CMP     Component Value Date/Time   NA 141 09/24/2014 0300   K 4.4 09/24/2014 0300   CL 100 09/24/2014 0300   CO2 20 09/24/2014 0300   GLUCOSE 148* 09/24/2014 0300   BUN 62* 09/24/2014 0300   CREATININE 6.91* 09/24/2014 0300   CALCIUM 8.6 09/24/2014 0300   CALCIUM 7.8* 12/07/2009 0558   PROT 6.1 09/24/2014 0300   ALBUMIN 2.2* 09/24/2014 0300   AST 20 09/24/2014 0300   ALT <5 09/24/2014 0300   ALKPHOS 70 09/24/2014 0300   BILITOT 0.8 09/24/2014 0300   GFRNONAA 7* 09/24/2014 0300   GFRAA 8* 09/24/2014 0300      Assessment and plan: 79 yo male with multiple medical problems who presented with weakness, confusion, dysrthria. Found to have NSTEMI, acute CVA, MSSA bacteremia.   1. Code Status: DNR previously documented  2. Goals of Care: See previous documentation. Brad Mcintosh appears worse today. More delirium. Had dyspnea requiring dialysis. Off pressor support however.  Spoke with son Brad Mcintosh.  He continues to have frustrations that providers suggesting anything other than restorative/curtive care.  He hopes to  get his dad back home eventually.  Son focused on his BP being better and how last week he was told his father would not make it this far.  He does not wish to "rush" to any decisions and will see how his father does over coming days.  We talked about some of the major challenges ahead with his delirium certainly being worrisome. Doubt he safely swallows. Can he tolerate tube feeds. Will he need physical/chemical restraints that could possibly lead to worsening mentation? Brad Mcintosh is aware of possibility of setbacks and concerns by physicians that he may not survive this hospitalization or do well in longer term.  I encouraged Brad Mcintosh to evaluate how his dad is responding to treatment and that if we are continuing to see setbacks and inability to tolerate treatments, this will make prognostication more certain.   3. Symptom Management:  1. Acute Encephalopathy- has been getting benzo's, which I would try to avoid. Usually will make delirium worse. Will defer to Cardiology about use of antipsychotics such as haldol.  Watch for untreated pain contributing to delirium as well.   4. Psychosocial/Spiritual: Lives in [place]. 3 children. Son Clinkscale reportedly POA. Has another son "Brad Mcintosh" and daughter Brad Mcintosh.   Total Time: 50 minutes >50% of time spent in counseling and coordination of care regarding above  along with family discussions.   Doran Clay D.O. Palliative Medicine Team at [facility]  Pager: [phone] Team Phone: [phone]

## 2014-09-24 NOTE — Progress Notes (Signed)
Ref: [name], MD   Subjective:  Drowsy. Vital signs barely stable except respirations has wide variations in rate. Awaiting dialysis today. Awaiting tube feeding trial.  Objective:  Vital Signs in the last 24 hours: Temp:  [97.5 F (36.4 C)-98 F (36.7 C)] 97.9 F (36.6 C) (12/14 0727) Pulse Rate:  [29-94] 90 (12/14 0727) Cardiac Rhythm:  [-] Atrial fibrillation (12/14 0400) Resp:  [9-38] 12 (12/14 0727) BP: (87-135)/(33-69) 113/59 mmHg (12/14 0727) SpO2:  [91 %-100 %] 95 % (12/14 0727) Weight:  [91.3 kg (201 lb 4.5 oz)] 91.3 kg (201 lb 4.5 oz) (12/14 0257)  Physical Exam: BP Readings from Last 1 Encounters:  09/24/14 113/59    Wt Readings from Last 1 Encounters:  09/24/14 91.3 kg (201 lb 4.5 oz)    Weight change:   HEENT: [place]/AT, Eyes-Brown, Conjunctiva-Pink, Sclera-Non-icteric Neck: No JVD, No bruit, Trachea midline. Lungs:  Clear, Bilateral. Cardiac:  Regular rhythm, normal S1 and S2, no S3. III/VI systolic murmur. Abdomen:  Soft, non-tender. Extremities:  No edema present. No cyanosis. No clubbing. Left arm AVF. Left Internal Jugular subclavian line. Bilateral great toe amputation. CNS: AxOx0, Cranial nerves grossly intact, moves all 4 extremities. Right handed. Skin: Warm and dry.   Intake/Output from previous day: 12/13 0701 - 12/14 0700 In: 1340 [P.O.:240; I.V.:1100] Out: -     Lab Results: BMET    Component Value Date/Time   NA 141 09/24/2014 0300   NA 141 09/21/2014 0420   NA 138 09/20/2014 0500   K 4.4 09/24/2014 0300   K 4.8 09/21/2014 0420   K 3.9 09/20/2014 0500   CL 100 09/24/2014 0300   CL 100 09/21/2014 0420   CL 100 09/20/2014 0500   CO2 20 09/24/2014 0300   CO2 20 09/21/2014 0420   CO2 19 09/20/2014 0500   GLUCOSE 148* 09/24/2014 0300   GLUCOSE 152* 09/21/2014 0420   GLUCOSE 179* 09/20/2014 0500   BUN 62* 09/24/2014 0300   BUN 70* 09/21/2014 0420   BUN 54* 09/20/2014 0500   CREATININE 6.91* 09/24/2014 0300   CREATININE 7.25*  09/21/2014 0420   CREATININE 6.35* 09/20/2014 0500   CALCIUM 8.6 09/24/2014 0300   CALCIUM 9.3 09/21/2014 0420   CALCIUM 9.1 09/20/2014 0500   CALCIUM 7.8* 12/07/2009 0558   CALCIUM 8.7 10/25/2009 1301   CALCIUM 8.5 05/29/2009 0936   GFRNONAA 7* 09/24/2014 0300   GFRNONAA 6* 09/21/2014 0420   GFRNONAA 8* 09/20/2014 0500   GFRAA 8* 09/24/2014 0300   GFRAA 7* 09/21/2014 0420   GFRAA 9* 09/20/2014 0500   CBC    Component Value Date/Time   WBC 15.3* 09/21/2014 0420   RBC 3.82* 09/21/2014 0420   RBC 4.05* 10/21/2013 2230   HGB 11.0* 09/21/2014 0420   HCT 33.7* 09/21/2014 0420   PLT 102* 09/21/2014 0420   MCV 88.2 09/21/2014 0420   MCH 28.8 09/21/2014 0420   MCHC 32.6 09/21/2014 0420   RDW 20.0* 09/21/2014 0420   LYMPHSABS 1.1 09/21/2014 0420   MONOABS 0.5 09/21/2014 0420   EOSABS 0.0 09/21/2014 0420   BASOSABS 0.0 09/21/2014 0420   HEPATIC Function Panel  Recent Labs  09/27/2014 0756 09/17/14 0030 09/24/14 0300  PROT 6.9 5.8* 6.1   HEMOGLOBIN A1C No components found for: HGA1C,  MPG CARDIAC ENZYMES Lab Results  Component Value Date   CKTOTAL 403* 12/09/2009   CKMB 4.9* 12/09/2009   TROPONINI 3.10* 09/17/2014   TROPONINI 2.59* 09/16/2014   TROPONINI 2.37* 09/16/2014   BNP No  results for input(s): PROBNP in the last 8760 hours. TSH  Recent Labs  10/23/13 1135 10/08/2014 2257  TSH 1.730 1.100   CHOLESTEROL  Recent Labs  09/16/14 0527  CHOL 79    Scheduled Meds: . antiseptic oral rinse  7 mL Mouth Rinse q12n4p  . atorvastatin  40 mg Oral q1800  . calcium acetate  667 mg Oral TID WC  . chlorhexidine  15 mL Mouth Rinse BID  . feeding supplement (NEPRO CARB STEADY)  237 mL Per Tube TID WC  . multivitamin  1 tablet Oral QHS  . pantoprazole  40 mg Oral Daily  . sodium chloride  10-40 mL Intracatheter Q12H  . ticagrelor  60 mg Oral BID  . Warfarin - Pharmacist Dosing Inpatient   Does not apply q1800   Continuous Infusions: . sodium chloride 50 mL/hr at  09/23/14 0700  . dextrose 5 % and 0.45% NaCl 25 mL/hr at 09/24/14 0630  . DOPamine Stopped (09/22/14 1812)   PRN Meds:.sodium chloride, sodium chloride, acetaminophen, alteplase, feeding supplement (NEPRO CARB STEADY), Gerhardt's butt cream, heparin, heparin, lidocaine (PF), lidocaine-prilocaine, loperamide, LORazepam, nitroGLYCERIN, ondansetron (ZOFRAN) IV, ondansetron, pentafluoroprop-tetrafluoroeth, senna-docusate, sodium chloride  Assessment/Plan: Small non-Q-wave myocardial infarction with atypical presentation Biventricular heart failure Confusion/mild left-sided weakness  Cardioembolic CVA Hypotensive shock multifactorial i.e. hypovolemia post dialysis/rule out sepsis Atrial fibrillation with controlled ventricular response CAD history of MI 2 in the past status post PCI to LAD and left circumflex in January 2015 Hypertension Diabetes mellitus, II Moderate aortic stenosis Peripheral vascular disease End-stage renal disease on hemodialysis Hypercholesteremia Anemia of chronic disease Tobacco abuse Marked leukocytosis probably secondary to steroids rule out sepsis History of gouty arthritis History of CA of prostate   Awaiting tube feeding.   LOS: 9 days    Dixie Dials  MD  09/24/2014, 9:04 AM

## 2014-09-24 NOTE — Progress Notes (Signed)
PT Cancellation Note  Patient Details Name: Brad Mcintosh MRN: [id number] DOB: 09-Dec-1934   Cancelled Treatment:    Reason Eval/Treat Not Completed: Patient at procedure or test/unavailable (HD)   [facility] 09/24/2014, 8:41 AM

## 2014-09-24 NOTE — Progress Notes (Signed)
PARENTERAL NUTRITION CONSULT NOTE - Follow-Up   Assessment: 11 YOM admitted 12/5 with confusion, left-sided weakness, and elevated troponin and was found to have MSSA bacteremia with bilateral embolic strokes. The patient was initially on a nectar thick/pureed diet however was transitioned to NPO on 12/10 due to high risk for aspiration. RD recommended the start of TFs at that time however these were never initiated. TPN was ordered for 12/13 however per conversation with Dr. Doylene Canard - will try enteral nutrition first since the patient's gut is functioning. Heather (RD) has been contacted and will aid in a fair trial of this. If the patient does not tolerate - at that time, will consider starting TNA.  Pharmacy will continue to monitor start and tolerance of EN for possible need to transition to TNA.   Plan: - Will monitor trial of enteral nutrition prior to starting TPN as patient's GI tract is intact. - Will f/u nutrition tolerance  Alycia Rossetti, PharmD, BCPS Clinical Pharmacist Pager: [phone] 09/24/2014 8:59 AM

## 2014-09-24 NOTE — Progress Notes (Signed)
NUTRITION FOLLOW-UP  INTERVENTION:  Start Nepro with Carb Steady @ 20 ml/hr and increase by 10 ml every 12 hours to 40 ml/hr. (Plan to advance TF slowly to establish tolerance and monitor labs) 30 ml Prostat TID   Provides: 2028 kcal, 122 grams protein, and 697 ml H2O.   NUTRITION DIAGNOSIS: Increased nutrient needs related to chronic illness as evidenced by estimated nutrition needs; ongoing.   Goal: Pt to meet >/= 90% of their estimated nutrition needs; not met.   Monitor:  PO intake, weight trends, labs, I/O's  ASSESSMENT: Pt with PMH for coronary artery disease history of silent inferior wall myocardial infarction subsequently had PTCA stenting to LAD and left circumflex noted to have chronically occluded RCA. Came to the ER by EMS as patient was noted to be confused and had left-sided weakness and left facial droop earlier this morning.  Labs: High BUN, creatinine, and phosphorus  Per SLP visit 12/12, pt at risk for aspiration due to mental status. Pt went from Dysphagia 1 with Nectar thickened liquids 12/6 to NPO 12/10. Pt has been without nutrition x 4 days.  Paged by TPN Pharmacist. TPN ordered but held in effort to allow for a TF trial.  Spoke with RN. Tube not yet placed, pt receiving HD at this time. Concern for pt's willingness to leave feeding tube in place. Pt did not open his eyes while in room, pt also unable to answer any questions. Palliative care following, per last note son wanted a few more days. Pt remains a DNR.   Height: Ht Readings from Last 1 Encounters:  09/16/2014 6' (1.829 m)    Weight: Wt Readings from Last 1 Encounters:  09/24/14 201 lb 15.1 oz (91.6 kg)  Admission weight: 182 lb (82.6 kg) 12/5  BMI:  Body mass index is 27.38 kg/(m^2).  Estimated Nutritional Needs: Kcal: 2000-2200 Protein: 105-115 grams Fluid: Per MD  Skin: non-pitting LE edema  Diet Order: Diet NPO time specified Except for: Other (See Comments)    Intake/Output Summary  (Last 24 hours) at 09/24/14 1034 Last data filed at 09/24/14 1000  Gross per 24 hour  Intake    995 ml  Output      0 ml  Net    995 ml    Last BM: 12/13  Labs:   Recent Labs Lab 09/20/14 0500 09/21/14 0420 09/24/14 0300  NA 138 141 141  K 3.9 4.8 4.4  CL 100 100 100  CO2 _0 BUN 54* 70* 62*  CREATININE 6.35* 7.25* 6.91*  CALCIUM 9.1 9.3 8.6  MG  --   --  2.3  PHOS  --   --  5.5*  GLUCOSE 179* 152* 148*    CBG (last 3)   Recent Labs  09/24/14 0209 09/24/14 0331 09/24/14 0730  GLUCAP 127* 134* 122*    Scheduled Meds: . antiseptic oral rinse  7 mL Mouth Rinse q12n4p  . atorvastatin  40 mg Oral q1800  . calcium acetate  667 mg Oral TID WC  . chlorhexidine  15 mL Mouth Rinse BID  . feeding supplement (NEPRO CARB STEADY)  1,000 mL Per Tube Q24H  . multivitamin  1 tablet Oral QHS  . pantoprazole  40 mg Oral Daily  . sodium chloride  10-40 mL Intracatheter Q12H  . ticagrelor  60 mg Oral BID  . Warfarin - Pharmacist Dosing Inpatient   Does not apply q1800    Continuous Infusions: . sodium chloride 50 mL/hr at 09/23/14  0700  . dextrose 5 % and 0.45% NaCl 25 mL/hr at 09/24/14 0800  . DOPamine Stopped (09/22/14 1812)   [place], [place], [place] Pager [phone] After Hours Pager

## 2014-09-24 NOTE — Progress Notes (Signed)
  [facility] Progress Note   Subjective: not responding much verbally  Filed Vitals:   09/24/14 0900 09/24/14 0905 09/24/14 0930 09/24/14 1000  BP: 114/36 101/48 109/45 112/43  Pulse: 41 68 59 76  Temp:      TempSrc:      Resp: 23 26 18 14   Height:      Weight:      SpO2: 98% 100% 100% 100%   Exam: Confused, lethargic +JVD Chest R clear, rales L base RRR no MRG Abd soft, NTND, nontender Diffuse UE edema bilat Mild-mod dependent LE edema Neuro is confused  HD: TTS [place] 4h   85kg   2/2.25 [place]  LUA AVF  Heparin 10,000    400/800 Hectorol 6 ug,  Aranesp 40 mcg/ thurs,  Venofer 100 mg / week pth 924, Hb 11.3  Assessment: 1. SOB - vol overload, 10kg up from admit 2. NSTEMI / hx CAD 3. BIvent HF 4. Acute CVA - embolic 5. HTN/ vol as above , vol excess 6. ESRD TTS HD 7. DM 2 8. Afib per cards 9. Hypotension - off pressors, BP normal    Plan- HD today w max UF for dyspnea, get CXR. HD again tomorrow if wet on CXR.     Kelly Splinter MD  pager [phone]    cell [phone]  09/24/2014, 10:16 AM     Recent Labs Lab 09/20/14 0500 09/21/14 0420 09/24/14 0300  NA 138 141 141  K 3.9 4.8 4.4  CL 100 100 100  CO2 19 20 20   GLUCOSE 179* 152* 148*  BUN 54* 70* 62*  CREATININE 6.35* 7.25* 6.91*  CALCIUM 9.1 9.3 8.6  PHOS  --   --  5.5*    Recent Labs Lab 09/24/14 0300  AST 20  ALT <5  ALKPHOS 70  BILITOT 0.8  PROT 6.1  ALBUMIN 2.2*    Recent Labs Lab 09/18/14 0335 09/21/14 0420  WBC 19.0* 15.3*  NEUTROABS  --  13.7*  HGB 11.1* 11.0*  HCT 32.8* 33.7*  MCV 87.0 88.2  PLT 105* 102*   . antiseptic oral rinse  7 mL Mouth Rinse q12n4p  . atorvastatin  40 mg Oral q1800  . calcium acetate  667 mg Oral TID WC  . chlorhexidine  15 mL Mouth Rinse BID  . feeding supplement (NEPRO CARB STEADY)  1,000 mL Per Tube Q24H  . multivitamin  1 tablet Oral QHS  . pantoprazole  40 mg Oral Daily  . sodium chloride  10-40 mL Intracatheter Q12H  .  ticagrelor  60 mg Oral BID  . Warfarin - Pharmacist Dosing Inpatient   Does not apply q1800   . sodium chloride 50 mL/hr at 09/23/14 0700  . dextrose 5 % and 0.45% NaCl 25 mL/hr at 09/24/14 0800  . DOPamine Stopped (09/22/14 1812)   sodium chloride, sodium chloride, acetaminophen, alteplase, feeding supplement (NEPRO CARB STEADY), Gerhardt's butt cream, heparin, heparin, lidocaine (PF), lidocaine-prilocaine, loperamide, LORazepam, nitroGLYCERIN, ondansetron (ZOFRAN) IV, ondansetron, pentafluoroprop-tetrafluoroeth, senna-docusate, sodium chloride

## 2014-09-24 NOTE — Progress Notes (Signed)
SLP Cancellation Note  Patient Details Name: Brad Mcintosh MRN: [id number] DOB: [birth year]   Cancelled treatment:       Reason Eval/Treat Not Completed: Patient at procedure or test/unavailable. Pt getting HD. Will return in am for PO trials. Plan for NG tube today though concerned pt will not tolerate.    Lilybeth Vien, Katherene Ponto 09/24/2014, 3:15 PM

## 2014-09-24 NOTE — Progress Notes (Signed)
ANTICOAGULATION CONSULT NOTE - Follow Up Consult  Pharmacy Consult for coumadin Indication: afib/CVA  Allergies  Allergen Reactions  . Altace [Ramipril] Other (See Comments) and Cough    Also renal failure    Patient Measurements: Height: 6' (182.9 cm) Weight: 201 lb 15.1 oz (91.6 kg) IBW/kg (Calculated) : 77.6  Vital Signs: Temp: 97.7 F (36.5 C) (12/14 1100) Temp Source: Axillary (12/14 1100) BP: 106/49 mmHg (12/14 1200) Pulse Rate: 79 (12/14 1200)  Labs:  Recent Labs  09/22/14 0524 09/23/14 0430 09/24/14 0300  LABPROT 22.6* 26.0* 28.1*  INR 1.97* 2.36* 2.60*  CREATININE  --   --  6.91*    Estimated Creatinine Clearance: 9.8 mL/min (by C-G formula based on Cr of 6.91).   Medications:  Scheduled:  . antiseptic oral rinse  7 mL Mouth Rinse q12n4p  . atorvastatin  40 mg Oral q1800  . calcium acetate  667 mg Oral TID WC  . chlorhexidine  15 mL Mouth Rinse BID  . feeding supplement (NEPRO CARB STEADY)  1,000 mL Per Tube Q24H  . feeding supplement (PRO-STAT SUGAR FREE 64)  30 mL Per Tube TID  . multivitamin  1 tablet Oral QHS  . pantoprazole  40 mg Oral Daily  . sodium chloride  10-40 mL Intracatheter Q12H  . ticagrelor  60 mg Oral BID  . Warfarin - Pharmacist Dosing Inpatient   Does not apply q1800    Assessment: 79 yo male with NSTEMI and embolic CVA with afib to continue on coumadin. Pt transitioned from IV heparin 12/9. No coumadin pta. INR remains therapeutic, 2.36>>2.6. Hg stable 11, plt low stable 102. No bleeding documented.  Goal of Therapy:  INR 2-3 Monitor platelets by anticoagulation protocol: Yes   Plan:   -Coumadin 2mg  po x1  -Daily PT/INR -Monitor s/sx bleeding  Elicia Lamp, PharmD Clinical Pharmacist - Resident Pager [phone] 09/24/2014 12:34 PM

## 2014-09-25 LAB — CBC
HCT: 32 % — ABNORMAL LOW (ref 39.0–52.0)
Hemoglobin: 10.3 g/dL — ABNORMAL LOW (ref 13.0–17.0)
MCH: 28.1 pg (ref 26.0–34.0)
MCHC: 32.2 g/dL (ref 30.0–36.0)
MCV: 87.2 fL (ref 78.0–100.0)
PLATELETS: 166 10*3/uL (ref 150–400)
RBC: 3.67 MIL/uL — ABNORMAL LOW (ref 4.22–5.81)
RDW: 20.2 % — ABNORMAL HIGH (ref 11.5–15.5)
WBC: 20 10*3/uL — AB (ref 4.0–10.5)

## 2014-09-25 LAB — RENAL FUNCTION PANEL
Albumin: 2.1 g/dL — ABNORMAL LOW (ref 3.5–5.2)
Anion gap: 27 — ABNORMAL HIGH (ref 5–15)
BUN: 51 mg/dL — ABNORMAL HIGH (ref 6–23)
CALCIUM: 8.9 mg/dL (ref 8.4–10.5)
CHLORIDE: 99 meq/L (ref 96–112)
CO2: 17 meq/L — AB (ref 19–32)
Creatinine, Ser: 5.61 mg/dL — ABNORMAL HIGH (ref 0.50–1.35)
GFR calc Af Amer: 10 mL/min — ABNORMAL LOW (ref >90)
GFR, EST NON AFRICAN AMERICAN: 9 mL/min — AB (ref >90)
Glucose, Bld: 228 mg/dL — ABNORMAL HIGH (ref 70–99)
POTASSIUM: 3.9 meq/L (ref 3.7–5.3)
Phosphorus: 5.1 mg/dL — ABNORMAL HIGH (ref 2.3–4.6)
SODIUM: 143 meq/L (ref 137–147)

## 2014-09-25 LAB — GLUCOSE, CAPILLARY
GLUCOSE-CAPILLARY: 181 mg/dL — AB (ref 70–99)
GLUCOSE-CAPILLARY: 118 mg/dL — AB (ref 70–99)
Glucose-Capillary: 146 mg/dL — ABNORMAL HIGH (ref 70–99)
Glucose-Capillary: 173 mg/dL — ABNORMAL HIGH (ref 70–99)
Glucose-Capillary: 187 mg/dL — ABNORMAL HIGH (ref 70–99)

## 2014-09-25 LAB — PROTIME-INR
INR: 3.23 — ABNORMAL HIGH (ref 0.00–1.49)
Prothrombin Time: 33.3 seconds — ABNORMAL HIGH (ref 11.6–15.2)

## 2014-09-25 MED ORDER — SODIUM CHLORIDE 0.9 % IV SOLN: 100.0000 mL | INTRAVENOUS | Status: DC | PRN

## 2014-09-25 MED ORDER — LIDOCAINE HCL (PF) 1 % IJ SOLN: 5.0000 mL | INTRAMUSCULAR | Status: DC | PRN

## 2014-09-25 MED ORDER — NEPRO/CARBSTEADY PO LIQD: 237.0000 mL | ORAL | Status: DC | PRN

## 2014-09-25 MED ORDER — PENTAFLUOROPROP-TETRAFLUOROETH EX AERO: 1.0000 "application " | INHALATION_SPRAY | CUTANEOUS | Status: DC | PRN

## 2014-09-25 MED ORDER — LIDOCAINE-PRILOCAINE 2.5-2.5 % EX CREA: 1.0000 "application " | TOPICAL_CREAM | CUTANEOUS | Status: DC | PRN

## 2014-09-25 MED ORDER — ALTEPLASE 2 MG IJ SOLR
2.0000 mg | Freq: Once | INTRAMUSCULAR | Status: AC | PRN
Start: 2014-09-25 — End: 2014-09-25
  Filled 2014-09-25: qty 2

## 2014-09-25 MED ORDER — CEFAZOLIN SODIUM-DEXTROSE 2-3 GM-% IV SOLR
2.0000 g | Freq: Once | INTRAVENOUS | Status: AC
  Administered 2014-09-25: 2 g via INTRAVENOUS
  Filled 2014-09-25 (×2): qty 50

## 2014-09-25 MED ORDER — HEPARIN SODIUM (PORCINE) 1000 UNIT/ML DIALYSIS
1000.0000 [IU] | INTRAMUSCULAR | Status: DC | PRN
  Filled 2014-09-25: qty 1

## 2014-09-25 NOTE — Progress Notes (Signed)
Patient ID:Brad Mcintosh      DOB: 07/04/1935      MGQ:[id number]   Palliative Medicine Team at [facility] Progress Note    Subjective: More alert this afternoon while on dialysis, still confused no complaints. As I was leaving room developed symptomatic bradycardia with decreased level of arousal and resp distress which resolved with resumption of normal heart rate.      Filed Vitals:   09/25/14 1536  BP: 114/53  Pulse: 48  Temp: 97.8 F (36.6 C)  Resp: 59   Physical exam: General: more alert today but still confused. Oriented to self only.  Chest: CTAB CVS: irregular, episode of bradycardia with HR in low 30's.   Abdomen: soft, NT, ND Ext: cool distal ext  CBC    Component Value Date/Time   WBC 20.0* 09/25/2014 1240   RBC 3.67* 09/25/2014 1240   RBC 4.05* 10/21/2013 2230   HGB 10.3* 09/25/2014 1240   HCT 32.0* 09/25/2014 1240   PLT 166 09/25/2014 1240   MCV 87.2 09/25/2014 1240   MCH 28.1 09/25/2014 1240   MCHC 32.2 09/25/2014 1240   RDW 20.2* 09/25/2014 1240   LYMPHSABS 1.1 09/21/2014 0420   MONOABS 0.5 09/21/2014 0420   EOSABS 0.0 09/21/2014 0420   BASOSABS 0.0 09/21/2014 0420    CMP     Component Value Date/Time   NA 143 09/25/2014 1240   K 3.9 09/25/2014 1240   CL 99 09/25/2014 1240   CO2 17* 09/25/2014 1240   GLUCOSE 228* 09/25/2014 1240   BUN 51* 09/25/2014 1240   CREATININE 5.61* 09/25/2014 1240   CALCIUM 8.9 09/25/2014 1240   CALCIUM 7.8* 12/07/2009 0558   PROT 6.1 09/24/2014 0300   ALBUMIN 2.1* 09/25/2014 1240   AST 20 09/24/2014 0300   ALT <5 09/24/2014 0300   ALKPHOS 70 09/24/2014 0300   BILITOT 0.8 09/24/2014 0300   GFRNONAA 9* 09/25/2014 1240   GFRAA 10* 09/25/2014 1240      Assessment and plan: 79 yo male with multiple medical problems who presented with weakness, confusion, dysrthria. Found to have NSTEMI, acute CVA, MSSA bacteremia.   1. Code Status: DNR previously documented  2. Goals of Care: See previous documentation.    Symptomatic Bradycardia today. Spoke with nursing who indicated that this also was happening prior to dialysis. They tell me they have contacted primary team. I worry that this is an ominous sign.  Spoke with son Brad Mcintosh about this. He would like call from Cardiology to discuss further.  Brad Mcintosh has been resistant to suggestions of possible comfort care earlier in hospital stay. Even if we give him honest news about issues that carry poor prognosis, suspect he will meet this with skepticism as he had earlier. Typically when i express concerns about how is he going to do, he tells me that other physicians told him his father would not be alive at this point.   3. Symptom Management:  1. Acute Encephalopathy/Delirium- agree with d/c of benzo's. Minimize restraints as able.  If medication needed, would favor use of low dose haldol PRN (1-2mg ).  4. Psychosocial/Spiritual: Lives in [place]. 3 children. Son Brad Mcintosh reportedly POA. Has another son "Brad Mcintosh" and daughter Brad Mcintosh.   Doran Clay D.O. Palliative Medicine Team at [facility]  Pager: [phone] Team Phone: [phone]

## 2014-09-25 NOTE — Progress Notes (Signed)
PARENTERAL NUTRITION CONSULT NOTE - Follow-Up   Assessment: 44 YOM admitted 12/5 with confusion, left-sided weakness, and elevated troponin and was found to have MSSA bacteremia with bilateral embolic strokes. The patient was initially on a nectar thick/pureed diet however was transitioned to NPO on 12/10 due to high risk for aspiration. RD recommended the start of TFs at that time however these were never initiated. TPN was ordered for 12/13 however per conversation with Dr. Doylene Canard - will try enteral nutrition first since the patient's gut is functioning. Heather (RD) has been contacted and will aid in a fair trial of this. If the patient does not tolerate - at that time, will consider starting TNA.  A NG tube was placed on 12/14 and Nepro Carb Steady was initiated at 10 ml/hr and increased to goal rate of 40 ml/hr. Per discussion with the RN this morning - the patient is tolerating well without complaints or pain. There are minimal to no residuals noted during checks. T  Plan: - Will continue to monitor tolerance of enteral nutrition - If the patient continues to tolerate - will plan to sign off of protocol on 12/16 - RD also involved and will continue to monitor patient  Alycia Rossetti, PharmD, BCPS Clinical Pharmacist Pager: [phone] 09/25/2014 10:24 AM

## 2014-09-25 NOTE — Progress Notes (Signed)
Admission note:  Arrival Method: Bed Mental Orientation: alert and oriented to self  Telemetry: box #4 applied; pt sinus brady on the monitor  Assessment: completed  Skin: partial thickness to buttocks and sacrum  IV: left IJ central line  Pain: pt verbally denies pain; pt moans during movement  Tubes: NG tube to left nostril connected to Nepro supplement at 5mL/hr   Admission: Completed and admission orders have been written  6E Orientation: Patient has been oriented to the unit, staff and to the room.

## 2014-09-25 NOTE — Progress Notes (Signed)
  [facility] Progress Note   Subjective: a little more coherent today, Ativan being held today, rec'd 4 doses last 48 hours for agitation  Filed Vitals:   09/25/14 0500 09/25/14 0600 09/25/14 0700 09/25/14 0800  BP: 127/36 117/38 125/44   Pulse: 58 79 53   Temp:    97.8 F (36.6 C)  TempSrc:    Oral  Resp: 33 27 32   Height:      Weight:      SpO2: 97% 96% 100%    Exam: Confused, more alert No jvd Chest R clear, rales L base RRR no MRG Abd soft, NTND, nontender Upper ext edema improving Mild dependent LE edema Neuro is confused, nonfocal, "[place]"  CXR 12/14 no edema  HD: TTS [place] 4h   85kg   2/2.25 [place]  LUA AVF  Heparin 10,000    400/800 Hectorol 6 ug,  Aranesp 40 mcg/ thurs,  Venofer 100 mg / week pth 924, Hb 11.3  Assessment: 1. Acute CVA - embolic, L-sided weakness 2. AMS - stopped Ativan, observe off sedating meds 3. Hypotension - resolved, poss RV infarct 4. MSSA bacteremia - on "Ancef IV, TEE when stable 5. NSTEMI / hx CAD w PCI to LAD/LCx in Jan 2015 - no intervention, per cardiology 6. Vol excess, improving; no edema on CXR yest 7. ESRD TTS HD 8. DM 2 9. Afib per cards 10. Volume excess - improved, shoot for 85 kg if BP stable. Was 82kg on admit which was under dry wt, avoid hypotension. Stopped IVF"s, does not need right now.     Plan- avoid sedating meds for now, see if confusion will improve.  HD today to get back on schedule , UF 3-4 kg if BP tolerates.    Kelly Splinter MD  pager [phone]    cell [phone]  09/25/2014, 9:50 AM     Recent Labs Lab 09/20/14 0500 09/21/14 0420 09/24/14 0300  NA 138 141 141  K 3.9 4.8 4.4  CL 100 100 100  CO2 19 20 20   GLUCOSE 179* 152* 148*  BUN 54* 70* 62*  CREATININE 6.35* 7.25* 6.91*  CALCIUM 9.1 9.3 8.6  PHOS  --   --  5.5*    Recent Labs Lab 09/24/14 0300  AST 20  ALT <5  ALKPHOS 70  BILITOT 0.8  PROT 6.1  ALBUMIN 2.2*    Recent Labs Lab 09/21/14 0420  WBC  15.3*  NEUTROABS 13.7*  HGB 11.0*  HCT 33.7*  MCV 88.2  PLT 102*   . antiseptic oral rinse  7 mL Mouth Rinse q12n4p  . atorvastatin  40 mg Oral q1800  . chlorhexidine  15 mL Mouth Rinse BID  . feeding supplement (NEPRO CARB STEADY)  1,000 mL Per Tube Q24H  . feeding supplement (PRO-STAT SUGAR FREE 64)  30 mL Per Tube TID  . multivitamin  1 tablet Oral QHS  . pantoprazole  40 mg Oral Daily  . sodium chloride  10-40 mL Intracatheter Q12H  . ticagrelor  60 mg Oral BID  . Warfarin - Pharmacist Dosing Inpatient   Does not apply q1800     acetaminophen (TYLENOL) oral liquid 160 mg/5 mL **OR** acetaminophen, Gerhardt's butt cream, loperamide, nitroGLYCERIN, ondansetron (ZOFRAN) IV, ondansetron, senna-docusate, sodium chloride

## 2014-09-25 NOTE — Procedures (Signed)
I was present at this session.  I have reviewed the session itself and made appropriate changes.  Bp 90-130.  Access press ok . tol HD  Kearsten Ginther L 12/15/20152:48 PM

## 2014-09-25 NOTE — Progress Notes (Signed)
Pharmacy CONSULT NOTE - Follow Up Consult  Pharmacy Consult for coumadin/Ancef Indication: afib/CVA / MSSA bacteremia  Allergies  Allergen Reactions  . Altace [Ramipril] Other (See Comments) and Cough    Also renal failure    Patient Measurements: Height: 6' (182.9 cm) Weight: 194 lb 0.1 oz (88 kg) IBW/kg (Calculated) : 77.6  Vital Signs: Temp: 97.8 F (36.6 C) (12/15 0800) Temp Source: Oral (12/15 0800) BP: 125/44 mmHg (12/15 0700) Pulse Rate: 53 (12/15 0700)  Labs:  Recent Labs  09/23/14 0430 09/24/14 0300 09/25/14 0400  LABPROT 26.0* 28.1* 33.3*  INR 2.36* 2.60* 3.23*  CREATININE  --  6.91*  --     Estimated Creatinine Clearance: 9.8 mL/min (by C-G formula based on Cr of 6.91).   Medications:  Scheduled:  . antiseptic oral rinse  7 mL Mouth Rinse q12n4p  . atorvastatin  40 mg Oral q1800  . chlorhexidine  15 mL Mouth Rinse BID  . feeding supplement (NEPRO CARB STEADY)  1,000 mL Per Tube Q24H  . feeding supplement (PRO-STAT SUGAR FREE 64)  30 mL Per Tube TID  . multivitamin  1 tablet Oral QHS  . pantoprazole  40 mg Oral Daily  . sodium chloride  10-40 mL Intracatheter Q12H  . ticagrelor  60 mg Oral BID  . Warfarin - Pharmacist Dosing Inpatient   Does not apply q1800    Assessment: 79 yo male with NSTEMI and embolic CVA with afib to continue on coumadin. Pt transitioned from IV heparin 12/9. No coumadin pta. INR now supratherapeutic 2.6>>3.23. Hg stable 11, plt low stable 102. No bleeding documented.  He is also on ancef for MSSA bacteremia dosed for ESRD. HD has been TTS - had been off schedule with last HD 12/14. To HD again today 12/15 to get back on schedule. Antibiotic treatment duration of 42 days per ID unless goals of care change (palliative care has been consulted). Afeb, wbc elevated 15.3.  12/5 Vanco>> 12/7 12/5 Zosyn>>12/7 12/7 ancef >>  12/5 blood x2- staph aureus (pan-S) 12/7 blood x1- ngf 12/9 c diff neg  Goal of Therapy:  INR  2-3 Monitor platelets by anticoagulation protocol: Yes   Plan:   -Hold coumadin tonight - restart when INR in range -Daily PT/INR -Monitor s/sx bleeding -Ancef 2gm post HD today  Elicia Lamp, PharmD Clinical Pharmacist - Resident Pager [phone] 09/25/2014 10:27 AM

## 2014-09-25 NOTE — Progress Notes (Signed)
OT Cancellation Note  Patient Details Name: Brad Mcintosh MRN: [id number] DOB: 20-Jan-1935   Cancelled Treatment:    Reason Eval/Treat Not Completed: Patient at procedure or test/ unavailable - will reattempt.   Darlina Rumpf [place], OTR/L [phone]  09/25/2014, 12:05 PM

## 2014-09-25 NOTE — Progress Notes (Signed)
Speech Language Pathology Treatment: Dysphagia  Patient Details Name: Brad Mcintosh MRN: [id number] DOB: 06-21-1935 Today's Date: 09/25/2014 Time: 3151-7616 SLP Time Calculation (min) (ACUTE ONLY): 15 min  Assessment / Plan / Recommendation Clinical Impression  Pt continues to show signs of penetration/aspiration with po's including significant throat clearing which evolved to coughing by the end of 2oz of puree. During this session, pt was lethargic and seemed to be in a state of delirium, requiring total assist for feeding. Recommend that he remain NPO (NG tube is in place) at this time. SLP will continue to follow and provide po trials as appropriate.    HPI HPI: 79 y.o. male with history of A fib, hypertension, ESRD on HD presenting with left sided weakness and confusion found to have an elevated troponin (2.33) in the ED. Admitted for NSTEMI.  Transferred to CCU 12/5 because of hypotension.  Pt npo except medicine with puree.     Pertinent Vitals Pain Assessment: No/denies pain  SLP Plan  Continue with current plan of care    Recommendations Diet recommendations: NPO Medication Administration: Via alternative means              Oral Care Recommendations: Oral care BID Follow up Recommendations: Skilled Nursing facility Plan: Continue with current plan of care    GO     Eden Emms 09/25/2014, 9:47 AM

## 2014-09-25 NOTE — Progress Notes (Signed)
Physical Therapy Treatment Patient Details Name: Brad Mcintosh MRN: [id number] DOB: 07-23-1935 Today's Date: 09/25/2014    History of Present Illness Pt adm with lt sided weakness and slurred speech.  Multiple scattered small (lacunar type) acute infarcts in the cerebrum and cerebellum. PMH - ESRD on HD, CAD, PVD, HTN, toe amputations    PT Comments    Pt with improved participation today. Remains very weak and has very poor mobility. Currently will need lift for OOB.  Follow Up Recommendations  SNF     Equipment Recommendations  Other (comment) (To be assessed)    Recommendations for Other Services       Precautions / Restrictions Precautions Precautions: Fall Restrictions Weight Bearing Restrictions: No    Mobility  Bed Mobility Overal bed mobility: Needs Assistance Bed Mobility: Rolling;Supine to Sit;Sit to Supine Rolling: +2 for physical assistance;Total assist   Supine to sit: +2 for physical assistance;Total assist Sit to supine: +2 for physical assistance;Total assist   General bed mobility comments: Assist to move legs and to bring trunk up into siiting. Assist to lower trunk and to bring legs back up into bed.  Transfers                 General transfer comment: Attempted sit to stand but unable to initiate any lift.  Ambulation/Gait                 Stairs            Wheelchair Mobility    Modified Rankin (Stroke Patients Only)       Balance   Sitting-balance support: No upper extremity supported;Feet supported Sitting balance-Leahy Scale: Poor Sitting balance - Comments: Sat EOB x 10 minutes with initial min A and then supervision.                            Cognition   Behavior During Therapy: Flat affect Overall Cognitive Status: Impaired/Different from baseline Area of Impairment: Orientation;Attention;Following commands;Safety/judgement;Problem solving;Awareness Orientation Level: Disoriented  to;Place;Time;Situation Current Attention Level: Focused Memory: Decreased short-term memory;Decreased recall of precautions Following Commands: Follows one step commands inconsistently Safety/Judgement: Decreased awareness of safety;Decreased awareness of deficits   Problem Solving: Slow processing;Decreased initiation;Difficulty sequencing;Requires verbal cues;Requires tactile cues General Comments: Pt followed approximately 50% of commands.    Exercises      General Comments        Pertinent Vitals/Pain Pain Assessment: Faces Faces Pain Scale: Hurts whole lot Pain Location: buttocks during cleaning of stool. Pain Descriptors / Indicators: Grimacing Pain Intervention(s): Limited activity within patient's tolerance;Monitored during session;Repositioned    Home Living                      Prior Function            PT Goals (current goals can now be found in the care plan section) Progress towards PT goals: Progressing toward goals    Frequency  Min 2X/week    PT Plan Current plan remains appropriate    Co-evaluation             End of Session Equipment Utilized During Treatment: Oxygen Activity Tolerance: Patient tolerated treatment well Patient left: in bed;with call bell/phone within reach;with bed alarm set     Time: 9629-5284 PT Time Calculation (min) (ACUTE ONLY): 32 min  Charges:  $Therapeutic Activity: 23-37 mins  G Codes:      Vernetta Dizdarevic 09/25/2014, 11:27 AM  Suanne Marker PT [phone]

## 2014-09-25 NOTE — Progress Notes (Signed)
Ref: Brad Riddle, MD   Subjective:  Awake. Earlier less confused per nurse. Vital signs are stable with some periods of bradycardia. Afebrile. Tolerating NG feeding at 40 cc/hr so far.  Objective:  Vital Signs in the last 24 hours: Temp:  [97.8 F (36.6 C)-98.2 F (36.8 C)] 98.2 F (36.8 C) (12/15 1725) Pulse Rate:  [31-92] 52 (12/15 1725) Cardiac Rhythm:  [-] Atrial fibrillation (12/15 0800) Resp:  [20-62] 62 (12/15 1725) BP: (92-134)/(33-82) 116/61 mmHg (12/15 1725) SpO2:  [92 %-100 %] 93 % (12/15 1725) Weight:  [87.1 kg (192 lb 0.3 oz)-88.9 kg (195 lb 15.8 oz)] 87.1 kg (192 lb 0.3 oz) (12/15 1536)  Physical Exam: BP Readings from Last 1 Encounters:  09/25/14 116/61    Wt Readings from Last 1 Encounters:  09/25/14 87.1 kg (192 lb 0.3 oz)    Weight change: 0.3 kg (10.6 oz)  HEENT: [place]/AT, Eyes-Brown, PERL, EOMI, Conjunctiva-Pink, Sclera-Non-icteric Neck: No JVD, No bruit, Trachea midline. Lungs:  Clear, Bilateral. Cardiac:  Regular rhythm, normal S1 and S2, no S3. III/VI systolic murmur. Abdomen:  Soft, non-tender. Extremities:  No edema present. No cyanosis. No clubbing. Left arm AVF. Left Internal Jugular subclavian line. Bilateral great toe amputation. CNS: AxOx1, Cranial nerves grossly intact, moves all 4 extremities. Right handed. Skin: Warm and dry.   Intake/Output from previous day: 12/14 0701 - 12/15 0700 In: 1372 [I.V.:620; NG/GT:702; IV Piggyback:50] Out: 3500     Lab Results: BMET    Component Value Date/Time   NA 143 09/25/2014 1240   NA 141 09/24/2014 0300   NA 141 09/21/2014 0420   K 3.9 09/25/2014 1240   K 4.4 09/24/2014 0300   K 4.8 09/21/2014 0420   CL 99 09/25/2014 1240   CL 100 09/24/2014 0300   CL 100 09/21/2014 0420   CO2 17* 09/25/2014 1240   CO2 20 09/24/2014 0300   CO2 20 09/21/2014 0420   GLUCOSE 228* 09/25/2014 1240   GLUCOSE 148* 09/24/2014 0300   GLUCOSE 152* 09/21/2014 0420   BUN 51* 09/25/2014 1240   BUN 62* 09/24/2014  0300   BUN 70* 09/21/2014 0420   CREATININE 5.61* 09/25/2014 1240   CREATININE 6.91* 09/24/2014 0300   CREATININE 7.25* 09/21/2014 0420   CALCIUM 8.9 09/25/2014 1240   CALCIUM 8.6 09/24/2014 0300   CALCIUM 9.3 09/21/2014 0420   CALCIUM 7.8* 12/07/2009 0558   CALCIUM 8.7 10/25/2009 1301   CALCIUM 8.5 05/29/2009 0936   GFRNONAA 9* 09/25/2014 1240   GFRNONAA 7* 09/24/2014 0300   GFRNONAA 6* 09/21/2014 0420   GFRAA 10* 09/25/2014 1240   GFRAA 8* 09/24/2014 0300   GFRAA 7* 09/21/2014 0420   CBC    Component Value Date/Time   WBC 20.0* 09/25/2014 1240   RBC 3.67* 09/25/2014 1240   RBC 4.05* 10/21/2013 2230   HGB 10.3* 09/25/2014 1240   HCT 32.0* 09/25/2014 1240   PLT 166 09/25/2014 1240   MCV 87.2 09/25/2014 1240   MCH 28.1 09/25/2014 1240   MCHC 32.2 09/25/2014 1240   RDW 20.2* 09/25/2014 1240   LYMPHSABS 1.1 09/21/2014 0420   MONOABS 0.5 09/21/2014 0420   EOSABS 0.0 09/21/2014 0420   BASOSABS 0.0 09/21/2014 0420   HEPATIC Function Panel  Recent Labs  09/14/2014 0756 09/17/14 0030 09/24/14 0300  PROT 6.9 5.8* 6.1   HEMOGLOBIN A1C No components found for: HGA1C,  MPG CARDIAC ENZYMES Lab Results  Component Value Date   CKTOTAL 403* 12/09/2009   CKMB 4.9* 12/09/2009  TROPONINI 3.10* 09/17/2014   TROPONINI 2.59* 09/16/2014   TROPONINI 2.37* 09/16/2014   BNP No results for input(s): PROBNP in the last 8760 hours. TSH  Recent Labs  10/23/13 1135 09/24/2014 2257  TSH 1.730 1.100   CHOLESTEROL  Recent Labs  09/16/14 0527  CHOL 79    Scheduled Meds: . antiseptic oral rinse  7 mL Mouth Rinse q12n4p  . atorvastatin  40 mg Oral q1800  .  ceFAZolin (ANCEF) IV  2 g Intravenous Once  . chlorhexidine  15 mL Mouth Rinse BID  . feeding supplement (NEPRO CARB STEADY)  1,000 mL Per Tube Q24H  . feeding supplement (PRO-STAT SUGAR FREE 64)  30 mL Per Tube TID  . multivitamin  1 tablet Oral QHS  . pantoprazole  40 mg Oral Daily  . sodium chloride  10-40 mL  Intracatheter Q12H  . ticagrelor  60 mg Oral BID  . Warfarin - Pharmacist Dosing Inpatient   Does not apply q1800   Continuous Infusions:  PRN Meds:.sodium chloride, sodium chloride, acetaminophen (TYLENOL) oral liquid 160 mg/5 mL **OR** acetaminophen, alteplase, feeding supplement (NEPRO CARB STEADY), Gerhardt's butt cream, heparin, lidocaine (PF), lidocaine-prilocaine, loperamide, nitroGLYCERIN, ondansetron (ZOFRAN) IV, ondansetron, pentafluoroprop-tetrafluoroeth, senna-docusate, sodium chloride  Assessment/Plan: Small non-Q-wave myocardial infarction with atypical presentation Biventricular heart failure Confusion/mild left-sided weakness  Cardioembolic CVA Hypotensive shock multifactorial i.e. hypovolemia post dialysis/rule out sepsis Atrial fibrillation with controlled ventricular response CAD history of MI 2 in the past status post PCI to LAD and left circumflex in January 2015 Hypertension Diabetes mellitus, II Moderate aortic stenosis Peripheral vascular disease End-stage renal disease on hemodialysis Hypercholesteremia Anemia of chronic disease Tobacco abuse Marked leukocytosis probably secondary to steroids rule out sepsis History of gouty arthritis History of CA of prostate  Continue current treatment. Consider G-tube placement if tolerates NG feeding for next 48 hours or re-evaluate for oral feeding if remains awake and alert.   LOS: 10 days    Dixie Dials  MD  09/25/2014, 7:06 PM     Alert

## 2014-09-26 LAB — GLUCOSE, CAPILLARY
GLUCOSE-CAPILLARY: 173 mg/dL — AB (ref 70–99)
GLUCOSE-CAPILLARY: 195 mg/dL — AB (ref 70–99)
GLUCOSE-CAPILLARY: 198 mg/dL — AB (ref 70–99)
Glucose-Capillary: 178 mg/dL — ABNORMAL HIGH (ref 70–99)

## 2014-09-26 LAB — PROTIME-INR
INR: 3.17 — ABNORMAL HIGH (ref 0.00–1.49)
Prothrombin Time: 32.8 seconds — ABNORMAL HIGH (ref 11.6–15.2)

## 2014-09-26 MED ORDER — DARBEPOETIN ALFA 40 MCG/0.4ML IJ SOSY
40.0000 ug | PREFILLED_SYRINGE | INTRAMUSCULAR | Status: DC
  Administered 2014-09-27: 40 ug via INTRAVENOUS
  Filled 2014-09-26: qty 0.4

## 2014-09-26 MED ORDER — SODIUM CHLORIDE 0.9 % IV SOLN
62.5000 mg | INTRAVENOUS | Status: DC
  Administered 2014-09-27: 62.5 mg via INTRAVENOUS
  Filled 2014-09-26 (×2): qty 5

## 2014-09-26 MED ORDER — CEFAZOLIN SODIUM-DEXTROSE 2-3 GM-% IV SOLR
2.0000 g | INTRAVENOUS | Status: DC
  Administered 2014-09-27: 2 g via INTRAVENOUS
  Filled 2014-09-26 (×2): qty 50

## 2014-09-26 MED ORDER — WARFARIN SODIUM 1 MG PO TABS
1.0000 mg | ORAL_TABLET | Freq: Once | ORAL | Status: AC
  Administered 2014-09-26: 1 mg via ORAL
  Filled 2014-09-26: qty 1

## 2014-09-26 MED ORDER — RESOURCE THICKENUP CLEAR PO POWD
ORAL | Status: DC | PRN
  Filled 2014-09-26: qty 125

## 2014-09-26 MED ORDER — DOXERCALCIFEROL 4 MCG/2ML IV SOLN
4.0000 ug | INTRAVENOUS | Status: DC
  Administered 2014-09-27: 4 ug via INTRAVENOUS
  Filled 2014-09-26: qty 2

## 2014-09-26 NOTE — Progress Notes (Signed)
Paged Dr. Doylene Canard for order to insert rectal tube;telephone order received. Rectal tube inserted without complication. Pt's buttocks with multiple excoriated areas; Gerhardt's butt cream applied over entire buttocks. Pt eill continue to be turned Q2H in attempts to keep him off of his bottom.

## 2014-09-26 NOTE — Progress Notes (Signed)
[facility] Progress Note  Assessment/Plan: 1. 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 ESRD - TTS - HD tomorrow 3. Anemia - Hgb 10.3- has not received any ESA since admission - resume Aranesp/Fe 4. Secondary hyperparathyroidism - resume hectorol at reduced dose due to ^ corr Ca; iPTH 900s PTA, no binders at present 5. Hypotension/volume - resolved; poss RV infarct; had two HD tmts 12/14 UF 3.5 and 1.6 on 12/15 with post HD weight of 87.1- still above edw by measures 6. Nutrition - TF at 40 per hr - D1 diet started 7. MSSA bacteremia - on Ancef / still with leukocytosis - no fevers - no infiltrate on recent CXR 8. DM -  9. Afib per cards 10. NSTEMI hx CAD with PCI to LAD/Lcx Jan 2015 - no intervention per cardiology 11. Disp - will need NHP vs rehab   Myriam Jacobson, PA-C [place] [phone] 09/26/2014,10:35 AM  LOS: 11 days   Pt seen, examined and agree w A/P as above.  Kelly Splinter MD pager [phone]    cell [phone] 09/26/2014, 2:03 PM    Subjective:   I want water. Recognizes me  Objective Filed Vitals:   09/25/14 1725 09/25/14 2000 09/26/14 0427 09/26/14 1000  BP: 116/61 101/51 106/42 94/48  Pulse: 52 64 77 46  Temp: 98.2 F (36.8 C) 98.7 F (37.1 C) 98.5 F (36.9 C) 98.2 F (36.8 C)  TempSrc: Axillary Oral Axillary Oral  Resp: 62 22 22 22   Height:      Weight:  87.176 kg (192 lb 3 oz)    SpO2: 93%  93% 96%   Physical Exam General: sitting in bed , mittens on hands, NG TF in progress Heart: irreg Lungs: difficult to hear due to constant talking Abdomen: soft NT Extremities: no overt edema Dialysis Access:  Left upper AVF + bruit  Dialysis Orders: TTS [place] 4h 85kg 2/2.25 [place] LUA AVF Heparin 10,000 400/800 Hectorol 6 ug, Aranesp 40 mcg/ thurs, Venofer 100 mg / week pth 924, Hb 11.3  Additional Objective Labs: Lab Results  Component Value Date   INR 3.17* 09/26/2014    INR 3.23* 09/25/2014   INR 2.60* 05/69/7948   Basic Metabolic Panel:  Recent Labs Lab 09/21/14 0420 09/24/14 0300 09/25/14 1240  NA 141 141 143  K 4.8 4.4 3.9  CL 100 100 99  CO2 20 20 17*  GLUCOSE 152* 148* 228*  BUN 70* 62* 51*  CREATININE 7.25* 6.91* 5.61*  CALCIUM 9.3 8.6 8.9  PHOS  --  5.5* 5.1*   Liver Function Tests:  Recent Labs Lab 09/24/14 0300 09/25/14 1240  AST 20  --   ALT <5  --   ALKPHOS 70  --   BILITOT 0.8  --   PROT 6.1  --   ALBUMIN 2.2* 2.1*   CBC:  Recent Labs Lab 09/21/14 0420 09/25/14 1240  WBC 15.3* 20.0*  NEUTROABS 13.7*  --   HGB 11.0* 10.3*  HCT 33.7* 32.0*  MCV 88.2 87.2  PLT 102* 166   Blood Culture    Component Value Date/Time   SDES BLOOD LEFT ARM 09/17/2014 1300   SPECREQUEST BOTTLES DRAWN AEROBIC ONLY 1CC 09/17/2014 1300   CULT  09/17/2014 1300    NO GROWTH 5 DAYS Performed at [facility] 09/23/2014 FINAL 09/17/2014 1300   CBG:  Recent Labs Lab 09/25/14 0801 09/25/14 1416 09/25/14 2005 09/25/14 2353 09/26/14 0400  GLUCAP 187* 146* 118* 173* 178*   Studies/Results: Dg Abd 1 View  09/24/2014   CLINICAL DATA:  NG tube placement.  EXAM: ABDOMEN - 1 VIEW  COMPARISON:  None.  FINDINGS: NG tube is identified with the tip in the midbody of the stomach.  IMPRESSION: As above.   Electronically Signed   By: Inge Rise M.D.   On: 09/24/2014 17:48   Dg Chest Port 1v Same Day  09/24/2014   CLINICAL DATA:  Short of breath.  Dialysis patient  EXAM: PORTABLE CHEST - 1 VIEW SAME DAY  COMPARISON:  09/18/2014  FINDINGS: Cardiac enlargement.  Negative for heart failure or edema.  Mild bibasilar atelectasis.  Negative for pleural effusion.  Left jugular central venous catheter tip in the SVC is unchanged.  IMPRESSION: Mild bibasilar atelectasis has progressed since the prior study. Negative for pulmonary edema.   Electronically Signed   By: Franchot Gallo M.D.   On: 09/24/2014 11:03    Medications:   . antiseptic oral rinse  7 mL Mouth Rinse q12n4p  . atorvastatin  40 mg Oral q1800  . chlorhexidine  15 mL Mouth Rinse BID  . feeding supplement (NEPRO CARB STEADY)  1,000 mL Per Tube Q24H  . feeding supplement (PRO-STAT SUGAR FREE 64)  30 mL Per Tube TID  . multivitamin  1 tablet Oral QHS  . pantoprazole  40 mg Oral Daily  . sodium chloride  10-40 mL Intracatheter Q12H  . ticagrelor  60 mg Oral BID  . Warfarin - Pharmacist Dosing Inpatient   Does not apply [phone]

## 2014-09-26 NOTE — Clinical Social Work Psychosocial (Signed)
Clinical Social Work Department BRIEF PSYCHOSOCIAL ASSESSMENT 09/26/2014  Patient:  Brad Mcintosh, Brad Mcintosh     Account Number:  [id number]     Admit date:  10/07/2014  Clinical Social Worker:  Frederico Hamman  Date/Time:  09/26/2014 04:23 AM  Referred by:  Physician  Date Referred:  09/25/2014 Referred for  SNF Placement   Other Referral:   Interview type:  Family Other interview type:    PSYCHOSOCIAL DATA Living Status:  FAMILY Admitted from facility:   Level of care:   Primary support name:  Brad Mcintosh Primary support relationship to patient:  CHILD, ADULT Degree of support available:   Patient's son Brad Mcintosh [phone]) and daughter Brad Mcintosh [phone]) caring and supportive.    CURRENT CONCERNS Current Concerns  Post-Acute Placement   Other Concerns:    SOCIAL WORK ASSESSMENT / PLAN CSW talked with Brad Mcintosh by phone regarding consult for skilled facility placement for ST (short-term) rehab. Son expressed immediate agreement. CSW explained skilled facility search and son requested that SNF list be emailed to him.    Nurse case manager Brad Mcintosh also spoke with Brad Mcintosh and explained that importance of patient being able to sit in a chair for dialysis before discharge and asked that son assist staff in encouraging patient in this regard.   Assessment/plan status:  Psychosocial Support/Ongoing Assessment of Needs Other assessment/ plan:   Information/referral to community resources:   Skilled facility list for [facility] to son, Brad Mcintosh.    PATIENT'S/FAMILY'S RESPONSE TO PLAN OF CARE: Brad Mcintosh very receptive to talking with CSW and in agreement with patient going to ST rehab once ready for discharge.

## 2014-09-26 NOTE — Progress Notes (Addendum)
Speech Language Pathology Treatment: Dysphagia  Patient Details Name: Brad Mcintosh MRN: [id number] DOB: [birth year] Today's Date: 09/26/2014 Time: 6433-2951 SLP Time Calculation (min) (ACUTE ONLY): 29 min  Assessment / Plan / Recommendation Clinical Impression  Pt mentation and attention to PO much improved since prior visits. Despite this, pt continues to have immediate hard coughing following trials of thin liquids and soft, but frequent, throat clearing with small sips of honey thick liquids, indicative of aspiration or penetration of liquids to the airway. Swallow is delayed and weak. Likely, pt has reached best potential for swallowing and risk of aspiration is likely to persist over the short term. Given that pt is not a good candidate for prolonged alternate methods of nutrition, recommend a trials of Dys 1(puree) and honey thick liquids to determine if pt is capable to tolerating a PO diet. Discussed with Dr. Doylene Canard, who is in favor of allowing pt to eat small quantities with NG still in place. Will follow closely, discussed with RN.    HPI HPI: 79 y.o. male with history of A fib, hypertension, ESRD on HD presenting with left sided weakness and confusion found to have an elevated troponin (2.33) in the ED. Admitted for NSTEMI.  Transferred to CCU 12/5 because of hypotension.  Pt npo except medicine with puree.     Pertinent Vitals Pain Assessment: Faces Faces Pain Scale: No hurt  SLP Plan  Continue with current plan of care    Recommendations Diet recommendations: Dysphagia 1 (puree);Honey-thick liquid Liquids provided via: Cup;Teaspoon Medication Administration: Crushed with puree Supervision: Full supervision/cueing for compensatory strategies;Staff to assist with self feeding Compensations: Slow rate;Small sips/bites Postural Changes and/or Swallow Maneuvers: Seated upright 90 degrees              Oral Care Recommendations: Oral care BID Follow up Recommendations: Skilled  Nursing facility Plan: Continue with current plan of care    GO    [facility], MA CCC-SLP [phone]  [name] 09/26/2014, 9:29 AM

## 2014-09-26 NOTE — Progress Notes (Signed)
NUTRITION FOLLOW-UP  INTERVENTION: Continue Nepro with Carb Steady at goal rate of 40 ml/hr with  30 ml Prostat TID per tube to provide 2028 kcal, 122 grams protein, and 697 ml H2O.   Tube feeding regimen meets 100% of estimated nutrition needs.  Will continue to monitor.  NUTRITION DIAGNOSIS: Increased nutrient needs related to chronic illness as evidenced by estimated nutrition needs; ongoing.   Goal: Pt to meet >/= 90% of their estimated nutrition needs;met.   Monitor:  PO intake, weight trends, labs, I/O's  ASSESSMENT: Pt with PMH for coronary artery disease history of silent inferior wall myocardial infarction subsequently had PTCA stenting to LAD and left circumflex noted to have chronically occluded RCA. Came to the ER by EMS as patient was noted to be confused and had left-sided weakness and left facial droop earlier this morning.  Pt has been receiving his tube feeding at goal of 40 ml/hr. Spoke with RN, pt has been tolerating well with little to no residuals. Pt has been advanced to a dysphagia 1 diet with honey thick liquids, however small amounts of PO allowed at a time, thus TF will be ran at full amounts to ensure adequate nutrition. Per MD note, will consider a G-tube placement if TF is tolerated within the next 48 hours or re-evaluation of oral feedings pending alertness.  Will continue to monitor.  Labs: Low CO2 and GFR. High BUN, creaitnine, phosphorous (5.1). CBG's 152-228 mg/dL.  Height: Ht Readings from Last 1 Encounters:  09/25/2014 6' (1.829 m)    Weight: Wt Readings from Last 1 Encounters:  09/25/14 192 lb 3 oz (87.176 kg)  Admission weight: 182 lb (82.6 kg) 12/5  BMI:  Body mass index is 26.06 kg/(m^2).  Re-Estimated Nutritional Needs: Kcal: 2000-2200 Protein: 105-115 grams Fluid: Per MD  Skin: non-pitting LE edema, pressure ulcer on scrotum   Diet Order: DIET - DYS 1 with honey thick liquids  No intake or output data in the 24 hours ending  09/26/14 1537  Last BM: 12/15  Labs:   Recent Labs Lab 09/21/14 0420 09/24/14 0300 09/25/14 1240  NA 141 141 143  K 4.8 4.4 3.9  CL 100 100 99  CO2 20 20 17*  BUN 70* 62* 51*  CREATININE 7.25* 6.91* 5.61*  CALCIUM 9.3 8.6 8.9  MG  --  2.3  --   PHOS  --  5.5* 5.1*  GLUCOSE 152* 148* 228*    CBG (last 3)   Recent Labs  09/25/14 2353 09/26/14 0400 09/26/14 0748  GLUCAP 173* 178* 173*    Scheduled Meds: . antiseptic oral rinse  7 mL Mouth Rinse q12n4p  . atorvastatin  40 mg Oral q1800  . [START ON 09/27/2014]  ceFAZolin (ANCEF) IV  2 g Intravenous Q T,Th,Sa-HD  . chlorhexidine  15 mL Mouth Rinse BID  . [START ON 09/27/2014] darbepoetin (ARANESP) injection - DIALYSIS  40 mcg Intravenous Q Thu-HD  . [START ON 09/27/2014] doxercalciferol  4 mcg Intravenous Q T,Th,Sa-HD  . feeding supplement (NEPRO CARB STEADY)  1,000 mL Per Tube Q24H  . feeding supplement (PRO-STAT SUGAR FREE 64)  30 mL Per Tube TID  . [START ON 09/27/2014] ferric gluconate (FERRLECIT/NULECIT) IV  62.5 mg Intravenous Q Thu-HD  . multivitamin  1 tablet Oral QHS  . pantoprazole  40 mg Oral Daily  . sodium chloride  10-40 mL Intracatheter Q12H  . ticagrelor  60 mg Oral BID  . warfarin  1 mg Oral ONCE-1800  . Warfarin -  Pharmacist Dosing Inpatient   Does not apply q1800    Continuous Infusions:   Kallie Locks, MS, RD, LDN Pager # [phone] After hours/ weekend pager # [phone]

## 2014-09-26 NOTE — Progress Notes (Signed)
Ref: [name], MD   Subjective:  Awake. Afebrile.  Objective:  Vital Signs in the last 24 hours: Temp:  [98 F (36.7 C)-98.7 F (37.1 C)] 98 F (36.7 C) (12/16 1800) Pulse Rate:  [46-77] 52 (12/16 1800) Cardiac Rhythm:  [-] Atrial fibrillation (12/15 2015) Resp:  [20-22] 20 (12/16 1800) BP: (94-108)/(42-53) 108/53 mmHg (12/16 1800) SpO2:  [93 %-96 %] 96 % (12/16 1800) Weight:  [87.176 kg (192 lb 3 oz)] 87.176 kg (192 lb 3 oz) (12/15 2000)  Physical Exam: BP Readings from Last 1 Encounters:  09/26/14 108/53    Wt Readings from Last 1 Encounters:  09/25/14 87.176 kg (192 lb 3 oz)    Weight change: -2.7 kg (-5 lb 15.2 oz)  HEENT: Divide/AT, Eyes-Brown, Conjunctiva-Pink, Sclera-Non-icteric Neck: No JVD, No bruit, Trachea midline. Lungs:  Clear, Bilateral. Cardiac:  Regular rhythm, normal S1 and S2, no S3. III/VI systolic murmur.  Abdomen:  Soft, non-tender. Extremities:  No edema present. No cyanosis. No clubbing. Left arm AVF. Left Internal Jugular subclavian line. Bilateral great toe amputation CNS: AxOx1, Thick speech, moves all 4 extremities. Right handed. Skin: Warm and dry.   Intake/Output from previous day: 12/15 0701 - 12/16 0700 In: 160 [NG/GT:160] Out: 1620     Lab Results: BMET    Component Value Date/Time   NA 143 09/25/2014 1240   NA 141 09/24/2014 0300   NA 141 09/21/2014 0420   K 3.9 09/25/2014 1240   K 4.4 09/24/2014 0300   K 4.8 09/21/2014 0420   CL 99 09/25/2014 1240   CL 100 09/24/2014 0300   CL 100 09/21/2014 0420   CO2 17* 09/25/2014 1240   CO2 20 09/24/2014 0300   CO2 20 09/21/2014 0420   GLUCOSE 228* 09/25/2014 1240   GLUCOSE 148* 09/24/2014 0300   GLUCOSE 152* 09/21/2014 0420   BUN 51* 09/25/2014 1240   BUN 62* 09/24/2014 0300   BUN 70* 09/21/2014 0420   CREATININE 5.61* 09/25/2014 1240   CREATININE 6.91* 09/24/2014 0300   CREATININE 7.25* 09/21/2014 0420   CALCIUM 8.9 09/25/2014 1240   CALCIUM 8.6 09/24/2014 0300   CALCIUM  9.3 09/21/2014 0420   CALCIUM 7.8* 12/07/2009 0558   CALCIUM 8.7 10/25/2009 1301   CALCIUM 8.5 05/29/2009 0936   GFRNONAA 9* 09/25/2014 1240   GFRNONAA 7* 09/24/2014 0300   GFRNONAA 6* 09/21/2014 0420   GFRAA 10* 09/25/2014 1240   GFRAA 8* 09/24/2014 0300   GFRAA 7* 09/21/2014 0420   CBC    Component Value Date/Time   WBC 20.0* 09/25/2014 1240   RBC 3.67* 09/25/2014 1240   RBC 4.05* 10/21/2013 2230   HGB 10.3* 09/25/2014 1240   HCT 32.0* 09/25/2014 1240   PLT 166 09/25/2014 1240   MCV 87.2 09/25/2014 1240   MCH 28.1 09/25/2014 1240   MCHC 32.2 09/25/2014 1240   RDW 20.2* 09/25/2014 1240   LYMPHSABS 1.1 09/21/2014 0420   MONOABS 0.5 09/21/2014 0420   EOSABS 0.0 09/21/2014 0420   BASOSABS 0.0 09/21/2014 0420   HEPATIC Function Panel  Recent Labs  10/03/2014 0756 09/17/14 0030 09/24/14 0300  PROT 6.9 5.8* 6.1   HEMOGLOBIN A1C No components found for: HGA1C,  MPG CARDIAC ENZYMES Lab Results  Component Value Date   CKTOTAL 403* 12/09/2009   CKMB 4.9* 12/09/2009   TROPONINI 3.10* 09/17/2014   TROPONINI 2.59* 09/16/2014   TROPONINI 2.37* 09/16/2014   BNP No results for input(s): PROBNP in the last 8760 hours. TSH  Recent Labs  10/23/13 1135 09/14/2014 2257  TSH 1.730 1.100   CHOLESTEROL  Recent Labs  09/16/14 0527  CHOL 79    Scheduled Meds: . antiseptic oral rinse  7 mL Mouth Rinse q12n4p  . atorvastatin  40 mg Oral q1800  . [START ON 09/27/2014]  ceFAZolin (ANCEF) IV  2 g Intravenous Q T,Th,Sa-HD  . chlorhexidine  15 mL Mouth Rinse BID  . [START ON 09/27/2014] darbepoetin (ARANESP) injection - DIALYSIS  40 mcg Intravenous Q Thu-HD  . [START ON 09/27/2014] doxercalciferol  4 mcg Intravenous Q T,Th,Sa-HD  . feeding supplement (NEPRO CARB STEADY)  1,000 mL Per Tube Q24H  . feeding supplement (PRO-STAT SUGAR FREE 64)  30 mL Per Tube TID  . [START ON 09/27/2014] ferric gluconate (FERRLECIT/NULECIT) IV  62.5 mg Intravenous Q Thu-HD  . multivitamin  1  tablet Oral QHS  . pantoprazole  40 mg Oral Daily  . sodium chloride  10-40 mL Intracatheter Q12H  . ticagrelor  60 mg Oral BID  . Warfarin - Pharmacist Dosing Inpatient   Does not apply q1800   Continuous Infusions:  PRN Meds:.sodium chloride, sodium chloride, acetaminophen (TYLENOL) oral liquid 160 mg/5 mL **OR** acetaminophen, Gerhardt's butt cream, loperamide, nitroGLYCERIN, ondansetron (ZOFRAN) IV, ondansetron, RESOURCE THICKENUP CLEAR, senna-docusate, sodium chloride  Assessment/Plan: Small non-Q-wave myocardial infarction with atypical presentation Biventricular heart failure Confusion/mild left-sided weakness  Cardioembolic CVA Hypotensive shock multifactorial i.e. hypovolemia post dialysis/rule out sepsis Atrial fibrillation with controlled ventricular response CAD history of MI 2 in the past status post PCI to LAD and left circumflex in January 2015 Hypertension Diabetes mellitus, II Moderate aortic stenosis Peripheral vascular disease End-stage renal disease on hemodialysis Hypercholesteremia Anemia of chronic disease Tobacco abuse Marked leukocytosis probably secondary to steroids rule out sepsis History of gouty arthritis History of CA of prostate  Will attempt oral feedings on small scale when alert.    LOS: 11 days    Dixie Dials  MD  09/26/2014, 7:02 PM

## 2014-09-26 NOTE — Progress Notes (Signed)
Pharmacy CONSULT NOTE - Follow Up Consult  Pharmacy Consult for coumadin/Ancef Indication: afib/CVA / MSSA bacteremia  Allergies  Allergen Reactions  . Altace [Ramipril] Other (See Comments) and Cough    Also renal failure    Patient Measurements: Height: 6' (182.9 cm) Weight: 192 lb 3 oz (87.176 kg) IBW/kg (Calculated) : 77.6  Vital Signs: Temp: 98.2 F (36.8 C) (12/16 1000) Temp Source: Oral (12/16 1000) BP: 94/48 mmHg (12/16 1000) Pulse Rate: 46 (12/16 1000)  Labs:  Recent Labs  09/24/14 0300 09/25/14 0400 09/25/14 1240 09/26/14 0609  HGB  --   --  10.3*  --   HCT  --   --  32.0*  --   PLT  --   --  166  --   LABPROT 28.1* 33.3*  --  32.8*  INR 2.60* 3.23*  --  3.17*  CREATININE 6.91*  --  5.61*  --     Estimated Creatinine Clearance: 12.1 mL/min (by C-G formula based on Cr of 5.61).   Medications:  Scheduled:  . antiseptic oral rinse  7 mL Mouth Rinse q12n4p  . atorvastatin  40 mg Oral q1800  . chlorhexidine  15 mL Mouth Rinse BID  . [START ON 09/27/2014] darbepoetin (ARANESP) injection - DIALYSIS  40 mcg Intravenous Q Thu-HD  . [START ON 09/27/2014] doxercalciferol  4 mcg Intravenous Q T,Th,Sa-HD  . feeding supplement (NEPRO CARB STEADY)  1,000 mL Per Tube Q24H  . feeding supplement (PRO-STAT SUGAR FREE 64)  30 mL Per Tube TID  . [START ON 09/27/2014] ferric gluconate (FERRLECIT/NULECIT) IV  62.5 mg Intravenous Q Thu-HD  . multivitamin  1 tablet Oral QHS  . pantoprazole  40 mg Oral Daily  . sodium chloride  10-40 mL Intracatheter Q12H  . ticagrelor  60 mg Oral BID  . Warfarin - Pharmacist Dosing Inpatient   Does not apply q1800    Assessment: 79 yo male with NSTEMI and acute embolic CVA (bilateral) with afib currently on coumadin.  Pt transitioned from IV heparin 12/9 to coumadin. No coumadin pta. INR has decreased some but remains supratherapeutic at  3.17, down from 3.23 <<2.6.  On 12/15 the Hgb was stable 10.3, plt low stable 166. No bleeding  documented. Also receiving Brilinta (decreased to 60mg  po bid; last stent 11/09/13 and MI x2).   He is also on ancef for MSSA bacteremia dosed for ESRD. HD has been TTS - had been off schedule with last HD 12/14. To HD yesterday 12/15 x 3.5 hrs. Cefazolin 2gm IV x1 given last night. Antibiotic treatment duration of 42 days per ID unless goals of care change. Palliative care consulted. Afeb, wbc elevated 20.3.  12/5 Vanco>> 12/7 12/5 Zosyn>>12/7 12/7 ancef >>  12/5 blood x2- staph aureus (pan-S) 12/7 blood x1- neg/final 12/9 c diff neg  Goal of Therapy:  INR 2-3 Monitor platelets by anticoagulation protocol: Yes   Plan:   -Give low dose of 1 mg coumadin tonight - restart when INR in range -Daily PT/INR -Monitor s/sx bleeding -Plan to give Ancef 2gm IV tomorrow post HD -continue to f/u dialysis plans and dose Ancef post HD.  Nicole Cella, RPh Clinical Pharmacist Pager: [phone] 09/26/2014 1:41 PM

## 2014-09-27 LAB — CBC
HCT: 31.1 % — ABNORMAL LOW (ref 39.0–52.0)
Hemoglobin: 10.3 g/dL — ABNORMAL LOW (ref 13.0–17.0)
MCH: 29.3 pg (ref 26.0–34.0)
MCHC: 33.1 g/dL (ref 30.0–36.0)
MCV: 88.4 fL (ref 78.0–100.0)
Platelets: 108 10*3/uL — ABNORMAL LOW (ref 150–400)
RBC: 3.52 MIL/uL — ABNORMAL LOW (ref 4.22–5.81)
RDW: 20.8 % — ABNORMAL HIGH (ref 11.5–15.5)
WBC: 19.4 10*3/uL — ABNORMAL HIGH (ref 4.0–10.5)

## 2014-09-27 LAB — RENAL FUNCTION PANEL
Albumin: 2.1 g/dL — ABNORMAL LOW (ref 3.5–5.2)
Anion gap: 20 — ABNORMAL HIGH (ref 5–15)
BUN: 65 mg/dL — ABNORMAL HIGH (ref 6–23)
CO2: 22 mEq/L (ref 19–32)
Calcium: 8.9 mg/dL (ref 8.4–10.5)
Chloride: 100 mEq/L (ref 96–112)
Creatinine, Ser: 5.27 mg/dL — ABNORMAL HIGH (ref 0.50–1.35)
GFR calc Af Amer: 11 mL/min — ABNORMAL LOW (ref >90)
GFR calc non Af Amer: 9 mL/min — ABNORMAL LOW (ref >90)
Glucose, Bld: 154 mg/dL — ABNORMAL HIGH (ref 70–99)
Phosphorus: 4.6 mg/dL (ref 2.3–4.6)
Potassium: 3.6 mEq/L — ABNORMAL LOW (ref 3.7–5.3)
Sodium: 142 mEq/L (ref 137–147)

## 2014-09-27 LAB — PROTIME-INR
INR: 2.62 — ABNORMAL HIGH (ref 0.00–1.49)
Prothrombin Time: 28.3 seconds — ABNORMAL HIGH (ref 11.6–15.2)

## 2014-09-27 LAB — GLUCOSE, CAPILLARY
GLUCOSE-CAPILLARY: 128 mg/dL — AB (ref 70–99)
GLUCOSE-CAPILLARY: 154 mg/dL — AB (ref 70–99)
Glucose-Capillary: 168 mg/dL — ABNORMAL HIGH (ref 70–99)
Glucose-Capillary: 97 mg/dL (ref 70–99)

## 2014-09-27 MED ORDER — WARFARIN SODIUM 1 MG PO TABS
1.0000 mg | ORAL_TABLET | Freq: Once | ORAL | Status: AC
  Administered 2014-09-27: 1 mg via ORAL
  Filled 2014-09-27: qty 1

## 2014-09-27 MED ORDER — LIDOCAINE HCL (PF) 1 % IJ SOLN: 5.0000 mL | INTRAMUSCULAR | Status: DC | PRN

## 2014-09-27 MED ORDER — DOXERCALCIFEROL 4 MCG/2ML IV SOLN
INTRAVENOUS | Status: AC
  Administered 2014-09-27: 4 ug via INTRAVENOUS
  Filled 2014-09-27: qty 2

## 2014-09-27 MED ORDER — PANTOPRAZOLE SODIUM 40 MG PO PACK
40.0000 mg | PACK | Freq: Every day | ORAL | Status: DC
  Administered 2014-09-27: 40 mg
  Filled 2014-09-27 (×2): qty 20

## 2014-09-27 MED ORDER — HEPARIN SODIUM (PORCINE) 1000 UNIT/ML DIALYSIS
1000.0000 [IU] | INTRAMUSCULAR | Status: DC | PRN
  Filled 2014-09-27: qty 1

## 2014-09-27 MED ORDER — NEPRO/CARBSTEADY PO LIQD: 237.0000 mL | ORAL | Status: DC | PRN

## 2014-09-27 MED ORDER — DARBEPOETIN ALFA 40 MCG/0.4ML IJ SOSY
PREFILLED_SYRINGE | INTRAMUSCULAR | Status: AC
  Administered 2014-09-27: 40 ug via INTRAVENOUS
  Filled 2014-09-27: qty 0.4

## 2014-09-27 MED ORDER — PENTAFLUOROPROP-TETRAFLUOROETH EX AERO: 1.0000 "application " | INHALATION_SPRAY | CUTANEOUS | Status: DC | PRN

## 2014-09-27 MED ORDER — SODIUM CHLORIDE 0.9 % IV SOLN: 100.0000 mL | INTRAVENOUS | Status: DC | PRN

## 2014-09-27 MED ORDER — WARFARIN SODIUM 2 MG PO TABS
2.0000 mg | ORAL_TABLET | Freq: Once | ORAL | Status: DC
  Filled 2014-09-27: qty 1

## 2014-09-27 MED ORDER — ALTEPLASE 2 MG IJ SOLR
2.0000 mg | Freq: Once | INTRAMUSCULAR | Status: AC | PRN
  Filled 2014-09-27: qty 2

## 2014-09-27 MED ORDER — HEPARIN SODIUM (PORCINE) 1000 UNIT/ML DIALYSIS
20.0000 [IU]/kg | INTRAMUSCULAR | Status: DC | PRN
  Filled 2014-09-27: qty 2

## 2014-09-27 MED ORDER — LIDOCAINE-PRILOCAINE 2.5-2.5 % EX CREA: 1.0000 "application " | TOPICAL_CREAM | CUTANEOUS | Status: DC | PRN

## 2014-09-27 NOTE — Clinical Social Work Placement (Signed)
Clinical Social Work Department CLINICAL SOCIAL WORK PLACEMENT NOTE 09/27/2014  Patient:  Brad Mcintosh, Brad Mcintosh  Account Number:  [id number] Admit date:  09/19/2014  Clinical Social Worker:  Pharaoh Pio Givens, LCSW  Date/time:  09/27/2014 12:42 PM  Clinical Social Work is seeking post-discharge placement for this patient at the following level of care:   SKILLED NURSING   (*CSW will update this form in Epic as items are completed)   09/26/2014  Patient/family provided with [place] Department of Clinical Social Work's list of facilities offering this level of care within the geographic area requested by the patient (or if unable, by the patient's family).  09/26/2014  Patient/family informed of their freedom to choose among providers that offer the needed level of care, that participate in Medicare, Medicaid or managed care program needed by the patient, have an available bed and are willing to accept the patient.    Patient/family informed of [facility]' ownership interest in [facility], as well as of the fact that they are under no obligation to receive care at this facility.  PASARR submitted to EDS on 09/26/2014 PASARR number received on 09/26/2014  FL2 transmitted to all facilities in geographic area requested by pt/family on  09/26/2014 FL2 transmitted to all facilities within larger geographic area on   Patient informed that his/her managed care company has contracts with or will negotiate with  certain facilities, including the following:     Patient/family informed of bed offers received:   Patient chooses bed at  Physician recommends and patient chooses bed at    Patient to be transferred to  on   Patient to be transferred to facility by  Patient and family notified of transfer on  Name of family member notified:    The following physician request were entered in Epic:   Additional Comments:    Danton Palmateer Givens, MSW, LCSW Licensed Clinical Social  Worker [place] [phone]

## 2014-09-27 NOTE — Procedures (Signed)
I was present at this dialysis session, have reviewed the session itself and made  appropriate changes  Kelly Splinter MD (pgr) [phone]    ([phone] 09/27/2014, 11:39 AM

## 2014-09-27 NOTE — Progress Notes (Addendum)
[facility] Progress Note  Assessment/Plan: 1. 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 ESRD - TTS - HD started - labs pending 3. Anemia - Hgb 10.3- has not received any ESA since admission - resumed Aranesp/Fe today - labs pending 4. Secondary hyperparathyroidism - resume hectorol at reduced dose due to ^ corr Ca; iPTH 900s PTA, no binders at present; labs pending 5. Hypotension/volume - resolved; poss RV infarct; had two HD tmts 12/14 UF 3.5 and 1.6 on 12/15 with post HD weight of 87.1- still above edw by measures 6. Nutrition - TF at 40 per hr yesterday - pulled out tube yesterday had mittens on but got one of them off- D1 diet started; NGT to be reinserted post HD, giving small bites 7. MSSA bacteremia - on Ancef / still with leukocytosis - no fevers - no infiltrate on recent CXR; repeat CBC pending 8. DM - BS ok 9. Afib per cards 10. NSTEMI hx CAD with PCI to LAD/Lcx Jan 2015 - no intervention per cardiology 11. Disp - will need NHP vs rehab/He is DNR  Myriam Jacobson, PA-C [place] [phone] 09/27/2014,9:33 AM  LOS: 12 days   Pt seen, examined and agree w A/P as above. Pt continues to do very poorly. He remains confused, in mitts with rectal tube in for diarrhea. Cdif was neg 9 days ago, will repeat.  Neuro recommended comfort care and DNR prior to signing off due to multifocal CVA's and poor prognosis.  Family did not want to consider comfort care.   Palliative care has seen with last note on 12/14.  Continue supportive care for now.  HD today.  Kelly Splinter MD pager [phone]    cell [phone] 09/27/2014, 11:35 AM    Subjective:   Calling for water; I'm here; help be get out of here; cannot tell me the name of where he is  Objective Filed Vitals:   09/27/14 0458 09/27/14 0819 09/27/14 0830 09/27/14 0900  BP: 108/37 100/53 106/48 96/55  Pulse: 63 63 62 69  Temp: 97.5 F (36.4 C) 97.8 F (36.6  C)    TempSrc: Oral Axillary    Resp: 18 18    Height:      Weight:  87.6 kg (193 lb 2 oz)    SpO2: 97% 98%     Physical Exam General: appears unhappy with the situation, yesterday more engaging Confused Heart: irreg Lungs: grossly clear Abdomen: soft nt Extremities: no LE edema  Dialysis Access: left upper AVF Qb 400  Dialysis Orders: TTS [place] 4h 85kg 2/2.25 [place] LUA AVF Heparin 10,000 400/800 Hectorol 6 ug, Aranesp 40 mcg/ thurs, Venofer 100 mg / week pth 924, Hb 11.3  Additional Objective Labs: Lab Results  Component Value Date   INR 2.62* 09/27/2014   INR 3.17* 09/26/2014   INR 3.23* 09/62/8366    Basic Metabolic Panel:  Recent Labs Lab 09/21/14 0420 09/24/14 0300 09/25/14 1240  NA 141 141 143  K 4.8 4.4 3.9  CL 100 100 99  CO2 20 20 17*  GLUCOSE 152* 148* 228*  BUN 70* 62* 51*  CREATININE 7.25* 6.91* 5.61*  CALCIUM 9.3 8.6 8.9  PHOS  --  5.5* 5.1*   Liver Function Tests:  Recent Labs Lab 09/24/14 0300 09/25/14 1240  AST 20  --   ALT <5  --   ALKPHOS 70  --   BILITOT 0.8  --   PROT 6.1  --  ALBUMIN 2.2* 2.1*   CBC:  Recent Labs Lab 09/21/14 0420 09/25/14 1240  WBC 15.3* 20.0*  NEUTROABS 13.7*  --   HGB 11.0* 10.3*  HCT 33.7* 32.0*  MCV 88.2 87.2  PLT 102* 166   Blood Culture    Component Value Date/Time   SDES BLOOD LEFT ARM 09/17/2014 1300   SPECREQUEST BOTTLES DRAWN AEROBIC ONLY 1CC 09/17/2014 1300   CULT  09/17/2014 1300    NO GROWTH 5 DAYS Performed at [place] 09/23/2014 FINAL 09/17/2014 1300   CBG:  Recent Labs Lab 09/26/14 1644 09/26/14 1958 09/26/14 2359 09/27/14 0454 09/27/14 0745  GLUCAP 195* 198* 154* 168* 128*  Medications:   . antiseptic oral rinse  7 mL Mouth Rinse q12n4p  . atorvastatin  40 mg Oral q1800  .  ceFAZolin (ANCEF) IV  2 g Intravenous Q T,Th,Sa-HD  . chlorhexidine  15 mL Mouth Rinse BID  . darbepoetin (ARANESP) injection - DIALYSIS  40 mcg  Intravenous Q Thu-HD  . doxercalciferol  4 mcg Intravenous Q T,Th,Sa-HD  . feeding supplement (NEPRO CARB STEADY)  1,000 mL Per Tube Q24H  . feeding supplement (PRO-STAT SUGAR FREE 64)  30 mL Per Tube TID  . ferric gluconate (FERRLECIT/NULECIT) IV  62.5 mg Intravenous Q Thu-HD  . multivitamin  1 tablet Oral QHS  . pantoprazole  40 mg Oral Daily  . sodium chloride  10-40 mL Intracatheter Q12H  . ticagrelor  60 mg Oral BID  . Warfarin - Pharmacist Dosing Inpatient   Does not apply [phone]

## 2014-09-27 NOTE — Progress Notes (Signed)
Dr. Doylene Canard states it's okay to leave NGT out for now. Continue to attempt oral feedings (pureed diet with honey thick liquids). Will continue to monitor.  Joellen Jersey, RN.

## 2014-09-27 NOTE — Progress Notes (Signed)
PT Cancellation Note  Patient Details Name: Brad Mcintosh MRN: [id number] DOB: [birth year]   Cancelled Treatment:    Reason Eval/Treat Not Completed: Patient at procedure or test/unavailable - will continue to check back as schedule allows.    Rolinda Roan 09/27/2014, 2:03 PM   Rolinda Roan, PT, DPT Acute Rehabilitation Services Pager: [phone]

## 2014-09-27 NOTE — Progress Notes (Signed)
Physical Therapy Treatment Patient Details Name: Brad Mcintosh MRN: [id number] DOB: [birth year] Today's Date: 09/27/2014    History of Present Illness Pt adm with lt sided weakness and slurred speech.  Multiple scattered small (lacunar type) acute infarcts in the cerebrum and cerebellum. PMH - ESRD on HD, CAD, PVD, HTN, toe amputations    PT Comments    Pt agreeable to participate however resisting therapists at times. Continues to demonstrate general weakness and poor mobility. Pt required +2 total assist for lateral scoot to drop-arm recliner. Recommending staff use lift for back to bed. Lift pad present in room.   Follow Up Recommendations  SNF;Supervision/Assistance - 24 hour     Equipment Recommendations  Other (comment) (TBD)    Recommendations for Other Services       Precautions / Restrictions Precautions Precautions: Fall Restrictions Weight Bearing Restrictions: No    Mobility  Bed Mobility Overal bed mobility: Needs Assistance Bed Mobility: Rolling;Supine to Sit Rolling: Total assist;+2 for physical assistance   Supine to sit: Total assist;+2 for physical assistance     General bed mobility comments: Assist to move legs and to bring trunk up into siiting.   Transfers Overall transfer level: Needs assistance Equipment used: 2 person hand held assist Transfers: Lateral/Scoot Transfers          Lateral/Scoot Transfers: Total assist;+2 physical assistance General transfer comment: Did not attempt sit>stand as pt was becomming restless sitting EOB. Pt was assisted to drop-arm recliner with bed pad for support and total assist to scoot. Multiple tried to get hips scooted all the way back in chair.   Ambulation/Gait             General Gait Details: unable   Stairs            Wheelchair Mobility    Modified Rankin (Stroke Patients Only)       Balance Overall balance assessment: Needs assistance Sitting-balance support: Feet  supported;Bilateral upper extremity supported Sitting balance-Leahy Scale: Poor Sitting balance - Comments: Sat EOB x 10 minutes with initial mod A and then supervision at times for short bouts of static sitting.  Postural control: Right lateral lean;Left lateral lean;Posterior lean                          Cognition Arousal/Alertness: Lethargic Behavior During Therapy: Flat affect Overall Cognitive Status: Impaired/Different from baseline Area of Impairment: Orientation;Attention;Following commands;Safety/judgement;Problem solving;Awareness Orientation Level: Disoriented to;Place;Time;Situation Current Attention Level: Focused Memory: Decreased short-term memory;Decreased recall of precautions Following Commands: Follows one step commands inconsistently Safety/Judgement: Decreased awareness of safety;Decreased awareness of deficits Awareness: Intellectual Problem Solving: Slow processing;Decreased initiation;Difficulty sequencing;Requires verbal cues;Requires tactile cues General Comments: Pt followed approximately 25% of commands.    Exercises      General Comments        Pertinent Vitals/Pain Pain Assessment: Faces Faces Pain Scale: Hurts little more Pain Location: Grimaces with movement of extremities.  Pain Descriptors / Indicators: Grimacing Pain Intervention(s): Limited activity within patient's tolerance;Repositioned    Home Living                      Prior Function            PT Goals (current goals can now be found in the care plan section) Acute Rehab PT Goals Patient Stated Goal: did not state PT Goal Formulation: Patient unable to participate in goal setting Time For Goal Achievement: 10/01/14 Potential to Achieve Goals:  Fair Progress towards PT goals: Progressing toward goals    Frequency  Min 2X/week    PT Plan Current plan remains appropriate    Co-evaluation             End of Session Equipment Utilized During  Treatment: Oxygen Activity Tolerance: Patient tolerated treatment well Patient left: in chair;with call bell/phone within reach     Time: 1422-1448 PT Time Calculation (min) (ACUTE ONLY): 26 min  Charges:  $Therapeutic Activity: 8-22 mins                    G Codes:      Rolinda Roan September 28, 2014, 3:15 PM  Rolinda Roan, PT, DPT Acute Rehabilitation Services Pager: [phone]

## 2014-09-27 NOTE — Progress Notes (Signed)
Patient had mittens on bilateral hands to prevent him from pulling his NGT out.  He somehow managed to pull his right mitten off and pulled his NGT out.  He has been agitated most of the night.  Screaming into the hallway for different people.  He does not want anyone to do anything around his mouth and nose.  He is to go to hemodialysis 1st rounds today.  I called and made Dr. Doylene Canard aware of the situation.  He stated to try to put the NGT back in after hemodialysis.  Will continue to monitor patient.  Earleen Reaper RN-BC, [employer]

## 2014-09-27 NOTE — Progress Notes (Signed)
Speech Language Pathology Treatment: Dysphagia  Patient Details Name: Brad Mcintosh MRN: [id number] DOB: [birth year] Today's Date: 09/27/2014 Time: 1445-1500 SLP Time Calculation (min) (ACUTE ONLY): 15 min  Assessment / Plan / Recommendation Clinical Impression  SLP provided max assist fading to moderate verbal tactile cues for self feeding. Pt tolerated honey thick liquids and puree without any evidence of aspiration in contrast to yesterdays session. Pt now with appropriate posture, up in chair, and NG tube out. Hopeful that tolerance will continue. Agree with plan to leave NG tube out. Discussed with RN.    HPI HPI: 79 y.o. male with history of A fib, hypertension, ESRD on HD presenting with left sided weakness and confusion found to have an elevated troponin (2.33) in the ED. Admitted for NSTEMI.  Transferred to CCU 12/5 because of hypotension.  Pt npo except medicine with puree.     Pertinent Vitals Pain Assessment: Faces Faces Pain Scale: Hurts little more  SLP Plan  Continue with current plan of care    Recommendations Diet recommendations: Dysphagia 1 (puree);Honey-thick liquid Liquids provided via: Cup Medication Administration: Crushed with puree Supervision: Full supervision/cueing for compensatory strategies;Staff to assist with self feeding Compensations: Slow rate;Small sips/bites Postural Changes and/or Swallow Maneuvers: Seated upright 90 degrees              Oral Care Recommendations: Oral care BID Follow up Recommendations: Skilled Nursing facility Plan: Continue with current plan of care    GO    [facility], [place] CCC-SLP [phone]  Brad Mcintosh 09/27/2014, 3:04 PM

## 2014-09-27 NOTE — Progress Notes (Signed)
Occupational Therapy Treatment Patient Details Name: Brad Mcintosh MRN: [id number] DOB: 10-10-1935 Today's Date: 09/27/2014    History of present illness Pt adm with lt sided weakness and slurred speech.  Multiple scattered small (lacunar type) acute infarcts in the cerebrum and cerebellum. PMH - ESRD on HD, CAD, PVD, HTN, toe amputations   OT comments  Pt grunting and stating "come on" with grimacing during session.  Focus of session on bed mobility, sitting balance EOB and lateral transfer from bed to chair. Pt with improvement in sitting balance as he sat, demonstrating ability to lean forward from back of chair without assist.  Follow Up Recommendations  SNF    Equipment Recommendations  None recommended by OT    Recommendations for Other Services      Precautions / Restrictions Precautions Precautions: Fall Restrictions Weight Bearing Restrictions: No       Mobility Bed Mobility Overal bed mobility: Needs Assistance Bed Mobility: Rolling;Supine to Sit Rolling: Total assist;+2 for physical assistance   Supine to sit: Total assist;+2 for physical assistance     General bed mobility comments: Assist to move legs and to bring trunk up into siiting.   Transfers Overall transfer level: Needs assistance Equipment used: 2 person hand held assist Transfers: Lateral/Scoot Transfers          Lateral/Scoot Transfers: Total assist;+2 physical assistance General transfer comment: Did not attempt sit>stand as pt was becomming restless sitting EOB. Pt was assisted to drop-arm recliner with bed pad for support and total assist to scoot. Multiple tried to get hips scooted all the way back in chair.     Balance Overall balance assessment: Needs assistance Sitting-balance support: Feet supported;Bilateral upper extremity supported Sitting balance-Leahy Scale: Poor Sitting balance - Comments: Sat EOB x 10 minutes with initial mod A and then supervision at times for short bouts of  static sitting.  Postural control: Right lateral lean;Left lateral lean;Posterior lean                         ADL Overall ADL's : Needs assistance/impaired     Grooming: Wash/dry face;Total assistance Grooming Details (indicate cue type and reason): hand over hand assist                                      Vision                     Perception     Praxis      Cognition   Behavior During Therapy: Flat affect Overall Cognitive Status: Impaired/Different from baseline Area of Impairment: Orientation;Attention;Following commands;Safety/judgement;Problem solving;Awareness Orientation Level: Disoriented to;Place;Time;Situation Current Attention Level: Focused Memory: Decreased short-term memory;Decreased recall of precautions  Following Commands: Follows one step commands inconsistently Safety/Judgement: Decreased awareness of safety;Decreased awareness of deficits Awareness: Intellectual Problem Solving: Slow processing;Decreased initiation;Difficulty sequencing;Requires verbal cues;Requires tactile cues General Comments: Pt followed approximately 25% of commands.    Extremity/Trunk Assessment               Exercises     Shoulder Instructions       General Comments      Pertinent Vitals/ Pain       Pain Assessment: Faces Pain Score: 4  Faces Pain Scale: Hurts little more Pain Location: Grimaces with movement of extremities.  Pain Descriptors / Indicators: Grimacing Pain Intervention(s): Monitored during session  Home  Living                                          Prior Functioning/Environment              Frequency Min 2X/week     Progress Toward Goals  OT Goals(current goals can now be found in the care plan section)  Progress towards OT goals: Progressing toward goals  Acute Rehab OT Goals Patient Stated Goal: did not state  Plan Discharge plan remains appropriate    Co-evaluation     PT/OT/SLP Co-Evaluation/Treatment: Yes Reason for Co-Treatment: For patient/therapist safety;Necessary to address cognition/behavior during functional activity   OT goals addressed during session: Strengthening/ROM      End of Session Equipment Utilized During Treatment: Gait belt   Activity Tolerance Patient limited by lethargy   Patient Left in chair;with call bell/phone within reach   Nurse Communication Mobility status        Time: 6599-3570 OT Time Calculation (min): 29 min  Charges: OT General Charges $OT Visit: 1 Procedure OT Treatments $Therapeutic Activity: 8-22 mins  Malka So 09/27/2014, 4:17 PM  [phone]

## 2014-09-27 NOTE — Progress Notes (Signed)
Ref: Tamryn Popko S, MD   Subjective:  Pulled mittens and NG tube out. Afebrile.  Objective:  Vital Signs in the last 24 hours: Temp:  [97.5 F (36.4 C)-98 F (36.7 C)] 97.8 F (36.6 C) (12/17 0819) Pulse Rate:  [52-70] 64 (12/17 1030) Cardiac Rhythm:  [-]  Resp:  [18-22] 18 (12/17 0819) BP: (96-113)/(37-55) 110/49 mmHg (12/17 1030) SpO2:  [96 %-100 %] 98 % (12/17 0819) Weight:  [87.6 kg (193 lb 2 oz)] 87.6 kg (193 lb 2 oz) (12/17 0819)  Physical Exam: BP Readings from Last 1 Encounters:  09/27/14 110/49    Wt Readings from Last 1 Encounters:  09/27/14 87.6 kg (193 lb 2 oz)    Weight change:   HEENT: [place]/AT, Eyes-Brown, Conjunctiva-Pink, Sclera-Non-icteric Neck: No JVD, No bruit, Trachea midline. Lungs:  Clear, Bilateral. Cardiac:  Regular rhythm, normal S1 and S2, no S3. III/VI systolic murmur. Abdomen:  Soft, non-tender. Extremities:  No edema present. No cyanosis. No clubbing. Left arm AVF. Left Internal Jugular subclavian line. Bilateral great toe amputation CNS: AxOx3, Cranial nerves grossly intact, moves all 4 extremities. Right handed. Skin: Warm and dry.   Intake/Output from previous day: 12/16 0701 - 12/17 0700 In: 690 [P.O.:330; I.V.:120; NG/GT:240] Out: -     Lab Results: BMET    Component Value Date/Time   NA 143 09/25/2014 1240   NA 141 09/24/2014 0300   NA 141 09/21/2014 0420   K 3.9 09/25/2014 1240   K 4.4 09/24/2014 0300   K 4.8 09/21/2014 0420   CL 99 09/25/2014 1240   CL 100 09/24/2014 0300   CL 100 09/21/2014 0420   CO2 17* 09/25/2014 1240   CO2 20 09/24/2014 0300   CO2 20 09/21/2014 0420   GLUCOSE 228* 09/25/2014 1240   GLUCOSE 148* 09/24/2014 0300   GLUCOSE 152* 09/21/2014 0420   BUN 51* 09/25/2014 1240   BUN 62* 09/24/2014 0300   BUN 70* 09/21/2014 0420   CREATININE 5.61* 09/25/2014 1240   CREATININE 6.91* 09/24/2014 0300   CREATININE 7.25* 09/21/2014 0420   CALCIUM 8.9 09/25/2014 1240   CALCIUM 8.6 09/24/2014 0300   CALCIUM  9.3 09/21/2014 0420   CALCIUM 7.8* 12/07/2009 0558   CALCIUM 8.7 10/25/2009 1301   CALCIUM 8.5 05/29/2009 0936   GFRNONAA 9* 09/25/2014 1240   GFRNONAA 7* 09/24/2014 0300   GFRNONAA 6* 09/21/2014 0420   GFRAA 10* 09/25/2014 1240   GFRAA 8* 09/24/2014 0300   GFRAA 7* 09/21/2014 0420   CBC    Component Value Date/Time   WBC 20.0* 09/25/2014 1240   RBC 3.67* 09/25/2014 1240   RBC 4.05* 10/21/2013 2230   HGB 10.3* 09/25/2014 1240   HCT 32.0* 09/25/2014 1240   PLT 166 09/25/2014 1240   MCV 87.2 09/25/2014 1240   MCH 28.1 09/25/2014 1240   MCHC 32.2 09/25/2014 1240   RDW 20.2* 09/25/2014 1240   LYMPHSABS 1.1 09/21/2014 0420   MONOABS 0.5 09/21/2014 0420   EOSABS 0.0 09/21/2014 0420   BASOSABS 0.0 09/21/2014 0420   HEPATIC Function Panel  Recent Labs  09/13/2014 0756 09/17/14 0030 09/24/14 0300  PROT 6.9 5.8* 6.1   HEMOGLOBIN A1C No components found for: HGA1C,  MPG CARDIAC ENZYMES Lab Results  Component Value Date   CKTOTAL 403* 12/09/2009   CKMB 4.9* 12/09/2009   TROPONINI 3.10* 09/17/2014   TROPONINI 2.59* 09/16/2014   TROPONINI 2.37* 09/16/2014   BNP No results for input(s): PROBNP in the last 8760 hours. TSH  Recent Labs  10/23/13 1135 09/27/2014 2257  TSH 1.730 1.100   CHOLESTEROL  Recent Labs  09/16/14 0527  CHOL 79    Scheduled Meds: . antiseptic oral rinse  7 mL Mouth Rinse q12n4p  . atorvastatin  40 mg Oral q1800  .  ceFAZolin (ANCEF) IV  2 g Intravenous Q T,Th,Sa-HD  . chlorhexidine  15 mL Mouth Rinse BID  . darbepoetin (ARANESP) injection - DIALYSIS  40 mcg Intravenous Q Thu-HD  . doxercalciferol  4 mcg Intravenous Q T,Th,Sa-HD  . feeding supplement (NEPRO CARB STEADY)  1,000 mL Per Tube Q24H  . feeding supplement (PRO-STAT SUGAR FREE 64)  30 mL Per Tube TID  . ferric gluconate (FERRLECIT/NULECIT) IV  62.5 mg Intravenous Q Thu-HD  . multivitamin  1 tablet Oral QHS  . pantoprazole  40 mg Oral Daily  . sodium chloride  10-40 mL  Intracatheter Q12H  . ticagrelor  60 mg Oral BID  . Warfarin - Pharmacist Dosing Inpatient   Does not apply q1800   Continuous Infusions:  PRN Meds:.sodium chloride, sodium chloride, sodium chloride, sodium chloride, acetaminophen (TYLENOL) oral liquid 160 mg/5 mL **OR** acetaminophen, alteplase, feeding supplement (NEPRO CARB STEADY), Gerhardt's butt cream, heparin, heparin, lidocaine (PF), lidocaine-prilocaine, loperamide, nitroGLYCERIN, ondansetron (ZOFRAN) IV, ondansetron, pentafluoroprop-tetrafluoroeth, RESOURCE THICKENUP CLEAR, senna-docusate, sodium chloride  Assessment/Plan: Small non-Q-wave myocardial infarction with atypical presentation Biventricular heart failure Confusion/mild left-sided weakness  Cardioembolic CVA Hypotensive shock multifactorial i.e. hypovolemia post dialysis/rule out sepsis Atrial fibrillation with controlled ventricular response CAD history of MI 2 in the past status post PCI to LAD and left circumflex in January 2015 Hypertension Diabetes mellitus, II Moderate aortic stenosis Peripheral vascular disease End-stage renal disease on hemodialysis Hypercholesteremia Anemia of chronic disease Tobacco abuse Marked leukocytosis probably secondary to steroids rule out sepsis History of gouty arthritis History of CA of prostate  Awaiting trial of oral feeding.     LOS: 12 days    Dixie Dials  MD  09/27/2014, 10:43 AM

## 2014-09-27 NOTE — Progress Notes (Signed)
Pharmacy CONSULT NOTE - Follow Up Consult  Pharmacy Consult for coumadin Indication: afib/CVA  Allergies  Allergen Reactions  . Altace [Ramipril] Other (See Comments) and Cough    Also renal failure    Patient Measurements: Height: 6' (182.9 cm) Weight: 193 lb 2 oz (87.6 kg) (Bed) IBW/kg (Calculated) : 77.6  Vital Signs: Temp: 97.8 F (36.6 C) (12/17 0819) Temp Source: Axillary (12/17 0819) BP: 105/45 mmHg (12/17 1059) Pulse Rate: 58 (12/17 1059)  Labs:  Recent Labs  09/25/14 0400 09/25/14 1240 09/26/14 0609 09/27/14 0500  HGB  --  10.3*  --   --   HCT  --  32.0*  --   --   PLT  --  166  --   --   LABPROT 33.3*  --  32.8* 28.3*  INR 3.23*  --  3.17* 2.62*  CREATININE  --  5.61*  --   --     Estimated Creatinine Clearance: 12.1 mL/min (by C-G formula based on Cr of 5.61).   Medications:  Scheduled:  . antiseptic oral rinse  7 mL Mouth Rinse q12n4p  . atorvastatin  40 mg Oral q1800  .  ceFAZolin (ANCEF) IV  2 g Intravenous Q T,Th,Sa-HD  . chlorhexidine  15 mL Mouth Rinse BID  . darbepoetin (ARANESP) injection - DIALYSIS  40 mcg Intravenous Q Thu-HD  . doxercalciferol  4 mcg Intravenous Q T,Th,Sa-HD  . feeding supplement (NEPRO CARB STEADY)  1,000 mL Per Tube Q24H  . feeding supplement (PRO-STAT SUGAR FREE 64)  30 mL Per Tube TID  . ferric gluconate (FERRLECIT/NULECIT) IV  62.5 mg Intravenous Q Thu-HD  . multivitamin  1 tablet Oral QHS  . pantoprazole  40 mg Oral Daily  . sodium chloride  10-40 mL Intracatheter Q12H  . ticagrelor  60 mg Oral BID  . Warfarin - Pharmacist Dosing Inpatient   Does not apply q1800    Assessment: 79 yo M with confusion, L sided weakness and elevated troponin. Admitted with STEMI and ? cardioembolic stroke. Head CT negative for bleed.  AC: s/p Low-dose heparin for NSTEMI and s/p CVA (also noted with PAF). MRI shows small bilateral embolic strokes which are likely cardioembolic from his acute MI or atrial fibrillation. Last stent  was 11/09/13. Likely to SNF at d/c. INR 2.89 12/6 (no coumadin pta) 12/9: d/c hep, start coumadin. D/c asa. Brilinta decr'd to 60mg  po bid 12/17 INR 2.62 <<3.17 <<3.23. No bleed documented  Neph: ESRD TTS HD on Renal vitamin, Phoslo. F/u HD schedule for Ancef dosing. Tolerating well.   Heme/Onc: ACD, h/o prostate ca. Hgb 11 stable - Nulecit 62.5 mg.  Plt low stable 102  Goal: INR 2-3  Plan:  - Warfarin 1 mg x 1 tonight - F/U daily INR - Monitor for signs and symptoms of bleeding  Hassie Bruce, Pharm. D. Clinical Pharmacy Resident Pager: [phone] Ph: [phone] 09/27/2014 11:18 AM

## 2014-09-27 NOTE — Progress Notes (Signed)
Patient agitated at this time. Refused to have CBG/vitals checked. Refusing PO meds. Will attempt again later.  Joellen Jersey, RN.

## 2014-09-28 LAB — GLUCOSE, CAPILLARY
Glucose-Capillary: 50 mg/dL — ABNORMAL LOW (ref 70–99)
Glucose-Capillary: 77 mg/dL (ref 70–99)
Glucose-Capillary: 83 mg/dL (ref 70–99)

## 2014-09-28 MED ORDER — MORPHINE SULFATE 2 MG/ML IJ SOLN
1.0000 mg | Freq: Once | INTRAMUSCULAR | Status: AC
  Administered 2014-09-28: 1 mg via INTRAVENOUS

## 2014-09-28 MED ORDER — DEXTROSE 50 % IV SOLN
INTRAVENOUS | Status: AC
  Administered 2014-09-28: 50 mL
  Filled 2014-09-28: qty 50

## 2014-09-28 MED ORDER — MORPHINE SULFATE 2 MG/ML IJ SOLN
INTRAMUSCULAR | Status: AC
  Filled 2014-09-28: qty 1

## 2014-10-12 NOTE — Progress Notes (Signed)
Hypoglycemic Event  CBG: 50   Treatment: D50 IV 50 mL  Symptoms: Sweaty; clammy  Follow-up CBG: Time: 1749 CBG Result: 77  Possible Reasons for Event: Inadequate meal intake  Comments/MD notified: Hypoglycemic Protocol    Brad Mcintosh  Remember to initiate Hypoglycemia Order Set & complete

## 2014-10-12 NOTE — Discharge Summary (Addendum)
Physician Death Summary  Patient ID: Brad Mcintosh MRN: [id number] DOB/AGE: 1935/01/16 80 y.o.  Admit date: 10/08/2014 Discharge date: 10-25-14  Admission Diagnoses: Small non-Q-wave myocardial infarction with atypical presentation Confusion/mild left-sided weakness rule out cardioembolic CVA Atrial fibrillation with controlled ventricular response CAD history of MI 2 in the past status post PCI to LAD and left circumflex in January 2015 Hypertension Diabetes mellitus Moderate aortic stenosis Peripheral vascular disease End-stage renal disease on hemodialysis Hypercholesteremia Anemia of chronic disease Tobacco abuse Marked leukocytosis etiology unclear History of gouty arthritis History of CA of prostate  Discharge Diagnoses:  Principle Problem: * Small non-Q-wave myocardial infarction* Biventricular heart failure Cardioembolic CVA Staphylococcus aureus bacteremia Hypotensive shock multifactorial i.e. hypovolemia post dialysis/rule out sepsis Atrial fibrillation with controlled ventricular response CAD  History of MI 2  Status post PCI to LAD and left circumflex in January 2015 Hypertension Diabetes mellitus, II Moderate aortic stenosis Peripheral vascular disease End-stage renal disease on hemodialysis Hypercholesteremia Anemia of chronic disease Tobacco abuse Marked leukocytosis probably secondary to steroids rule out sepsis History of gouty arthritis History of CA of prostate Central apnea Senile dementia Thrombocytopenia, mild to moderate, secondary to medication use   Discharged Condition: Expired at 4:55 AM on 10/25/2014  Hospital Course: 80 year old male with past medical history significant for multiple medical problems i.e. coronary artery disease history of silent inferior wall myocardial infarction in the past and non-Q-wave myocardial infarction in January 2015 subsequently had PTCA stenting to LAD and left circumflex noted to have chronically occluded  RCA, hypertension, diabetes mellitus, end-stage renal disease on hemodialysis, history of paroxysmal A. fib, valvular heart disease, hypercholesterolemia, peripheral vascular disease, valvular heart disease, anemia of chronic disease, history of tobacco abuse, gouty arthritis, history of CVA of prostate, came to the ER by EMS as patient was noted to be confused and had left-sided weakness and left facial droop on the morning of admission. He suffered small non-Q wave MI, cardio embolic stroke and Biventricular systolic heart failure. He had renal, neurology and infectious disease consults. He underwent periodic hemodialysis treatments, IV antibiotics including nafcillin, Coumadin and over 1 week of ICU management with pressure agents and IV fluid boluses. He was not a candidate for cardiac interventions due to co-morbidities. He had just started tolerating some oral feedings. This morning around 4 AM he had hypotension, respiratory distress and cool and clammy extremities and was pronounced dead at 4:55 AM. Appropriate disposition to funeral home was made.  Consults: cardiology, nephrology and neurology  Significant Diagnostic Studies: labs: WBC count of 18.8 K, Hgb of 12.5, Platelets count of 141 K, BUN/Cr of 35/7.16, albumin-3.1, bilirubin 2.9 and Troponin I of 2.33. HgA1C of 5.4. INR-2.62  EKG-atrial fibrillation, Incomplete right BBB and anterolateral ischemia.  Echocardiogram on 09/16/2014 - Left ventricle: The cavity size was mildly dilated. There was mild concentric hypertrophy. Systolic function was moderately reduced. The estimated ejection fraction was in the range of 35%nto 40%. There is moderate hypokinesis of the entire myocardium. - Aortic valve: Moderately calcified annulus. Trileaflet; moderately thickened, severely calcified leaflets. Transvalvular velocity was increased, due to stenosis. There was moderate stenosis. There was mild regurgitation. Valve area (VTI): 1.24 cm^2.  Valve area (Vmax): 1.24 cm^2. Valve area (Vmean): 1.15 cm^2. - Mitral valve: Calcified annulus. There was moderate regurgitation. - Left atrium: The atrium was severely dilated. - Right ventricle: The cavity size was severely dilated. Wall thickness was normal. Systolic function was moderately to severely reduced. - Right atrium: The atrium was mildly dilated. -  Tricuspid valve: There was severe regurgitation. - Pulmonary arteries: Systolic pressure was mildly increased.  MRI Head: 1. Multiple scattered small (lacunar type) acute infarcts and both cerebral and cerebellar hemispheres. Pattern suggestive of recent embolic event from the heart or proximal aorta. No associated mass effect or hemorrhage. 2. No circle of Willis large vessel occlusion or stenosis identified. 3. MRI discontinued prior to completion due to patient confusion.  Treatments: IV hydration, antibiotics: Ancef, analgesia: Morphine and anticoagulation: warfarin and ticagrelor.  Discharge Exam: Blood pressure 90/50, pulse 109, temperature 97.7 F (36.5 C), temperature source Axillary, resp. rate 24, height 6' (1.829 m), weight 86.6 kg (190 lb 14.7 oz), SpO2 51 %. HEENT: [place]/AT, Eyes-Brown, Conjunctiva-Pink, Sclera-Non-icteric Neck: No JVD, No bruit, Trachea midline. Lungs: Not breathing. Cardiac:No pulse.  Abdomen: Soft. Extremities: Cool and dry. Left arm AVF. Bilateral great toe amputation CNS: AxOx0,  Skin: Cool and dry.  Disposition: 20-Expired, Funeral home     Medication List    ASK your doctor about these medications        aspirin 81 MG EC tablet  Take 1 tablet (81 mg total) by mouth daily.     citalopram 20 MG tablet  Commonly known as:  CELEXA  Take 20 mg by mouth at bedtime. Depression     cloNIDine 0.3 MG tablet  Commonly known as:  CATAPRES  Take 1 tablet (0.3 mg total) by mouth at bedtime.     colchicine 0.6 MG tablet  Take 0.6 mg by mouth daily as needed (gout).      cyclobenzaprine 10 MG tablet  Commonly known as:  FLEXERIL  Take 10 mg by mouth 3 (three) times daily as needed for muscle spasms.     gabapentin 100 MG capsule  Commonly known as:  NEURONTIN  Take 100 mg by mouth 2 (two) times daily.     HYDROcodone-acetaminophen 5-325 MG per tablet  Commonly known as:  NORCO/VICODIN  Take 1 tablet by mouth every 6 (six) hours as needed for moderate pain.     hydrOXYzine 25 MG tablet  Commonly known as:  ATARAX/VISTARIL  Take 1 tablet (25 mg total) by mouth every 6 (six) hours as needed for itching.     isosorbide mononitrate 60 MG 24 hr tablet  Commonly known as:  IMDUR  Take 60 mg by mouth daily.     metoprolol succinate 100 MG 24 hr tablet  Commonly known as:  TOPROL-XL  Take 100 mg by mouth daily.     multivitamin Tabs tablet  Take 1 tablet by mouth daily.     omeprazole 40 MG capsule  Commonly known as:  PRILOSEC  Take 40 mg by mouth daily.     ondansetron 4 MG disintegrating tablet  Commonly known as:  ZOFRAN-ODT  Take 4 mg by mouth every 8 (eight) hours as needed for nausea or vomiting.     pantoprazole 40 MG tablet  Commonly known as:  PROTONIX  Take 40 mg by mouth daily.     simvastatin 10 MG tablet  Commonly known as:  ZOCOR  Take 10 mg by mouth at bedtime.     ticagrelor 90 MG Tabs tablet  Commonly known as:  BRILINTA  Take 1 tablet (90 mg total) by mouth 2 (two) times daily.         SignedDixie Dials S Oct 20, 2014, 4:59 AM

## 2014-10-12 NOTE — Significant Event (Signed)
Rapid Response Event Note Called per floor RN for pt in respiratory distress, moaning. Pt DNR, palliative care consulting yet per Son's wishes pt is not comfort care at this time per epic notes.  Overview: Time Called: 3567 Arrival Time: 0330 Event Type: Respiratory  Initial Focused Assessment: Pt found restless in bed on NRB . Moaning, does not follow commands. Labored breathing, unable to obtained po2 reading after several attempts. Cool and clammy extremeites. Pt appears to be in dying process.  Interventions: CBG obtained yielding 50, d50 amp given. Dr.Kadakia paged and updated on pt status. 1 Mg morphine ordered and  IVP. Pt Son called and updated, en route to hospital now. Floor RN advised to monitor pt closely, and notify provider when Son arrives.   Event Summary: Name of Physician Notified: Dr. Cydney Ok at [place]    at    Outcome: Stayed in room and stabalized     White, Paia

## 2014-10-12 NOTE — Progress Notes (Addendum)
This RN called to Pt room. Pt O2 sats 55% on RA. Respiration pattern labored. Placed on nonrebreather. Unable to obtain O2 sats with multiple attempts. Extremities cool to touch and clammy. Respiratory therapist and Rapid Response called. CBG 50. Administered Dextrose IV. CBG elevated to 77. Notified Dr. Doylene Canard. New orders received for Morphine 1 mg x1. Notified and updated son, [name]. Per son will be on the way to the hospital.

## 2014-10-12 NOTE — Progress Notes (Signed)
This RN called to Pt room. Pt breathless and pulseless, extremities cool to touch and clammy. Unable to obtain vital signs. Verified by two RNs, Dorothea Glassman and Brett Fairy Deshazo at [phone]. Family at bedside. Notified Dr. Doylene Canard. Death certificate to be signed at office per MD. Notified [facility].

## 2014-10-12 DEATH — deceased

## 2015-02-01 NOTE — Op Note (Signed)
PATIENT NAME:  Brad, Mcintosh MR#:  [id number] DATE OF BIRTH:  [birth year]  DATE OF PROCEDURE:  01/30/2013  PREOPERATIVE DIAGNOSES: 1.  End-stage renal disease.  2.  Poorly functioning left arm arteriovenous graft with a pseudoaneurysm as well.  3.  Peripheral arterial disease.  4.  Hypertension.   POSTOPERATIVE DIAGNOSES:  1.  End-stage renal disease.  2.  Poorly functioning left arm arteriovenous graft with a pseudoaneurysm as well.  3.  Peripheral arterial disease.  4.  Hypertension.   PROCEDURES PERFORMED: 1.  Ultrasound guidance for vascular access to left arm arteriovenous graft.  2.  Left arm arteriovenous shuntogram and central venogram.  3.  Percutaneous transluminal angioplasty of venous anastomotic stenosis with 8 mm diameter angioplasty balloon.  4.  Viabahn covered stent placement to midportion of arteriovenous graft for pseudoaneurysm with a 7 mm diameter x 5 cm in length Viabahn stent.   SURGEON: Algernon Huxley, M.D.   ANESTHESIA: Local with moderate conscious sedation.   ESTIMATED BLOOD LOSS: 25 mL.   INDICATION FOR PROCEDURE: This is a 80 year old African American male with end-stage renal disease who is referred from his dialysis access center. He has developed a large pseudoaneurysm in the midportion of his graft with bruising and swelling around this and diminished flows with his access. He is brought in for a shuntogram for further evaluation. Risks and benefits were discussed and informed consent was obtained.   DESCRIPTION OF PROCEDURE: The patient was brought to the vascular and interventional radiology suite. The left upper extremity was sterilely prepped and draped and a sterile surgical field was created. The graft was accessed not far beyond the arterial anastomosis with a micropuncture needle and micropuncture wire and sheath were placed. Imaging was performed.  This showed a moderate to large size pseudoaneurysm in the midportion of the graft consistent with the  clinical appearance of pseudoaneurysm that he describes. This also demonstrated somewhat of a napkin-like ring at the venous anastomosis that appeared to be a moderate to high-grade stenosis. The remainder of the central venous circulation was patent. The patient was given 3000 units of intravenous heparin for systemic anticoagulation. A 7-French sheath was placed across the lesion without difficulty with an 0.018 wire. A 7 mm diameter x 5 cm in length Viabahn covered stent was placed encompassing the area of pseudoaneurysm. This was postdilated with an 8 mm balloon and the venous anastomosis was treated with an 8 mm diameter balloon as well. Completion angiogram showed resolution of the pseudoaneurysm with a patent stent. There was still some mild stenosis in the area of the venous anastomosis, but this did not appear high-grade and the blood flow was significantly improved. At this point, I elected to terminate the procedure. The sheath was removed around a 4-0 Monocryl pursestring suture, pressure was held and sterile dressing was placed. The patient tolerated the procedure well and was taken to the recovery room in stable condition. ____________________________ Algernon Huxley, MD jsd:sb D: 01/30/2013 11:15:13 ET T: 01/30/2013 11:59:47 ET JOB#: [id number]  cc: Algernon Huxley, MD, <Dictator> Algernon Huxley MD ELECTRONICALLY SIGNED 01/30/2013 14:00

## 2015-02-03 IMAGING — CT CT HEAD W/O CM
2 series · 15 of 30 positions shown, 19 images · non-contrast
Comparison: Head CT 10/28/2012.

CLINICAL DATA: Lethargy and altered mental status. Dialysis
patient. Left facial droop. Initial encounter.

EXAM:
CT HEAD WITHOUT CONTRAST
TECHNIQUE: Contiguous axial images were obtained from the base of the skull
through the vertex without intravenous contrast.

[Series 201: head w/o, idose (1) · axial · non-contrast · 0.45mm/px · z∈[-132,-7]mm · 13 of 31 slices shown, 17 images]
[im 3/31  brain]
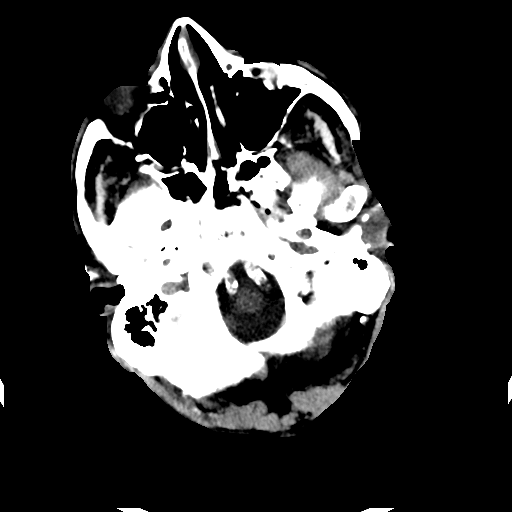
[im 3/31  bone]
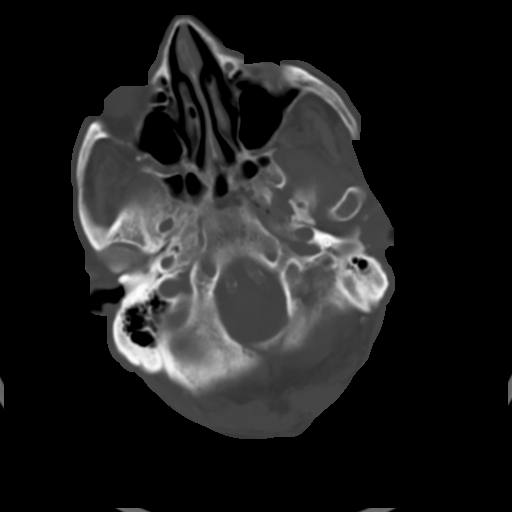
[im 5/31  brain]
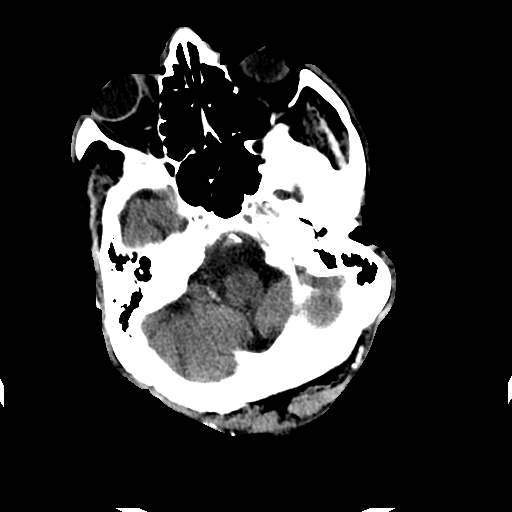
[im 7/31  brain]
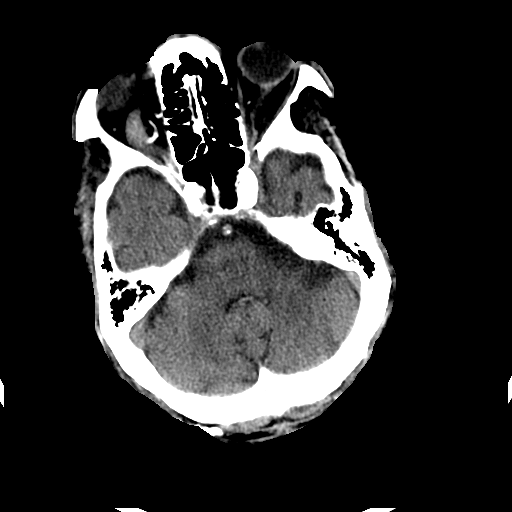
[im 9/31  brain]
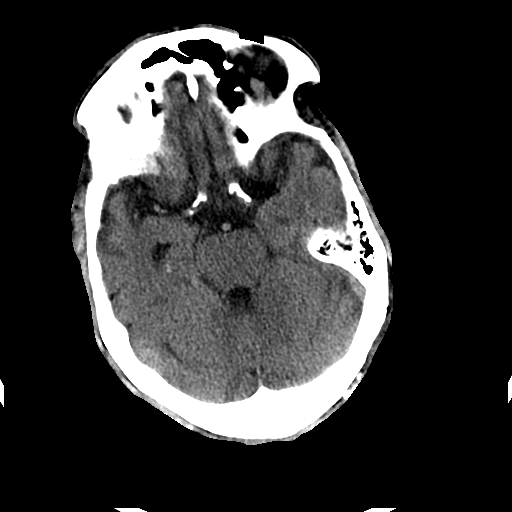
[im 11/31  brain]
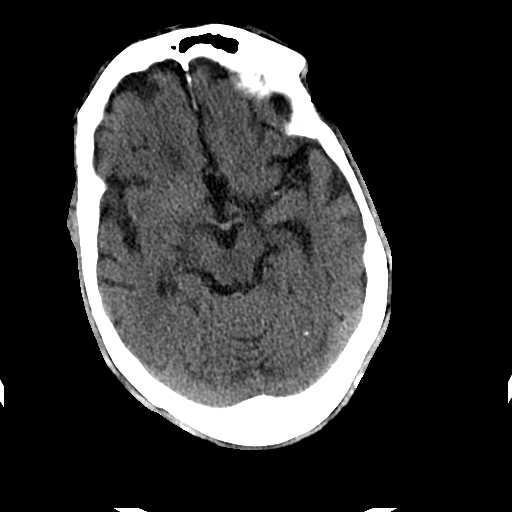
[im 11/31  bone]
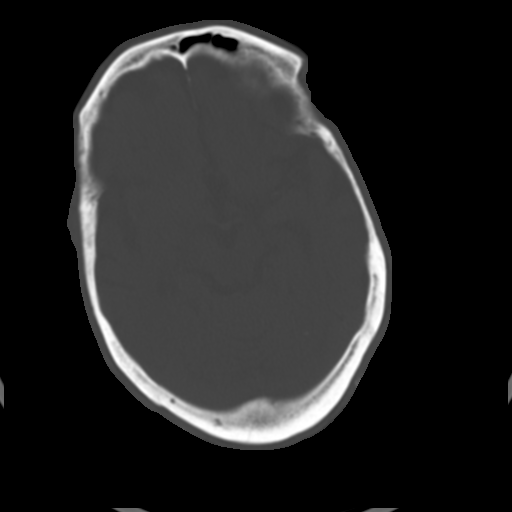
[im 13/31  brain]
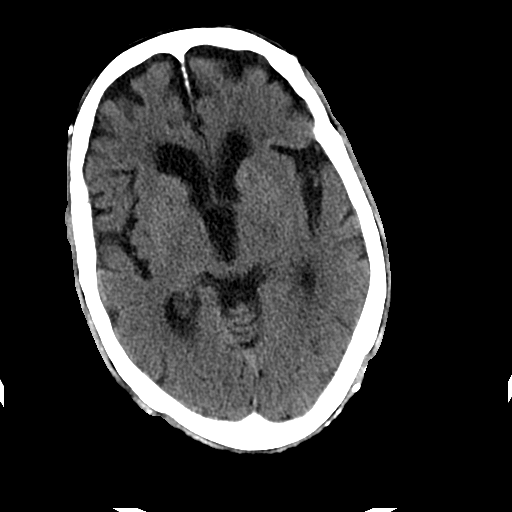
[im 16/31  brain]
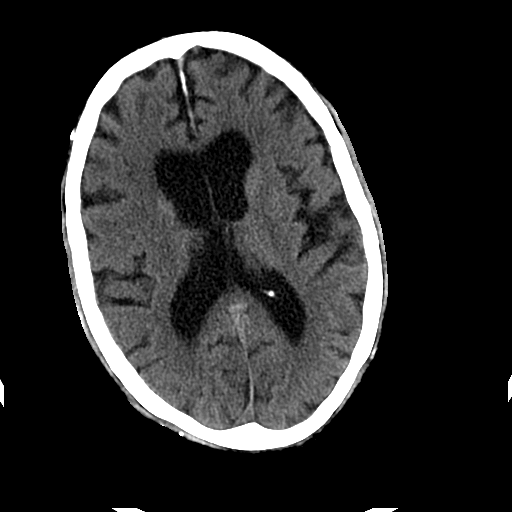
[im 18/31  brain]
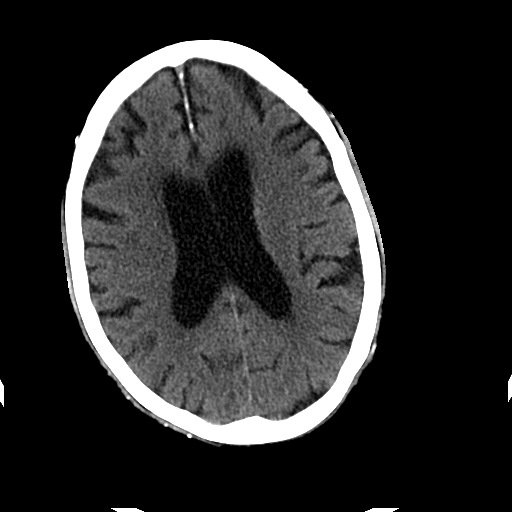
[im 20/31  brain]
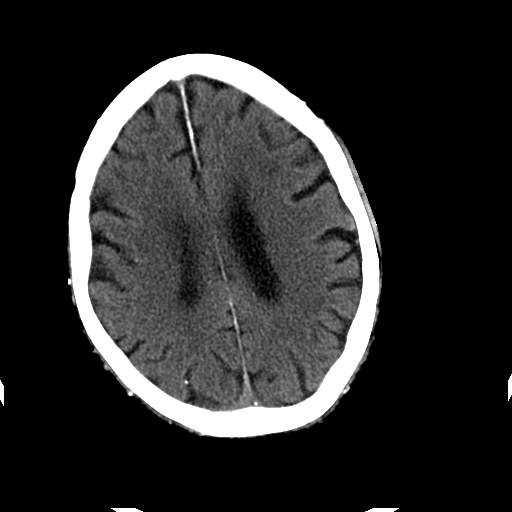
[im 20/31  bone]
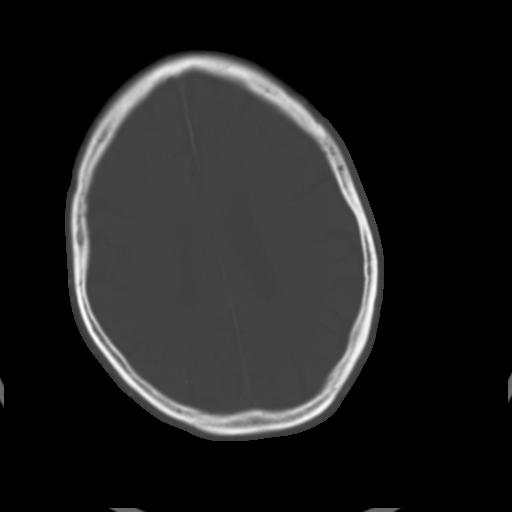
[im 22/31  brain]
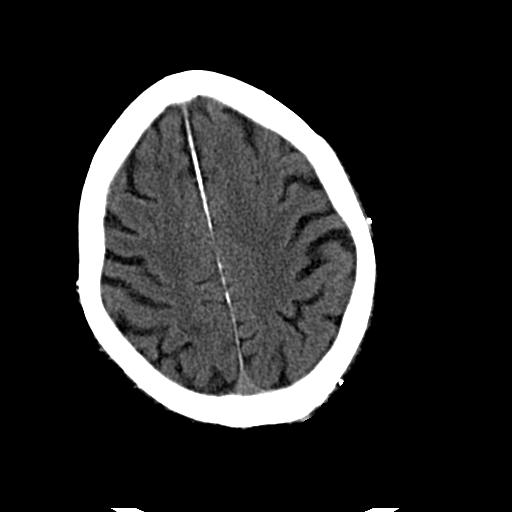
[im 24/31  brain]
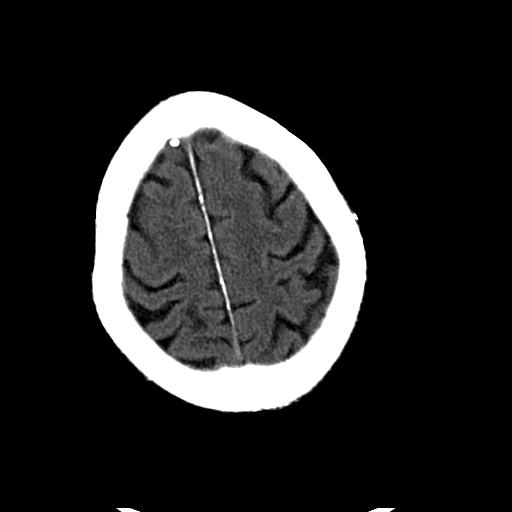
[im 26/31  brain]
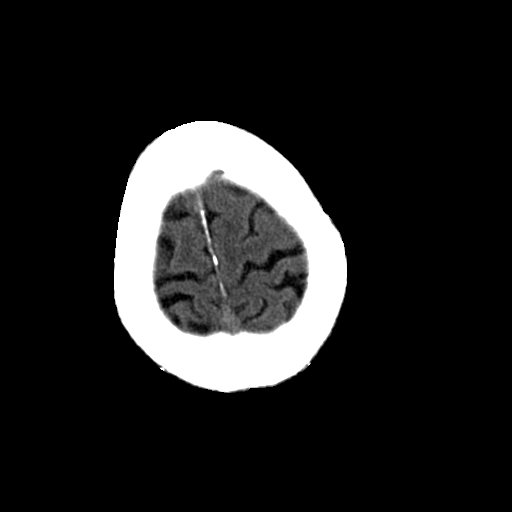
[im 28/31  brain]
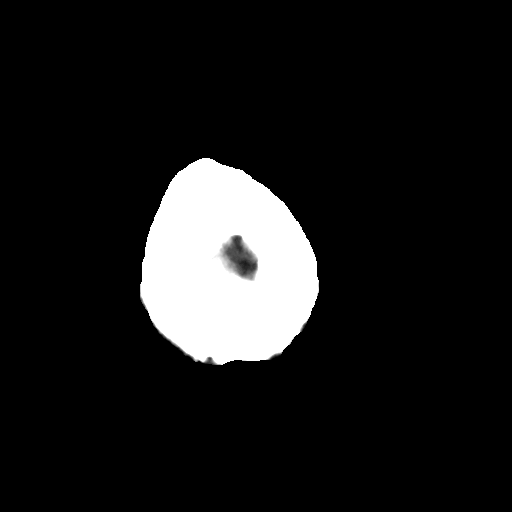
[im 28/31  bone]
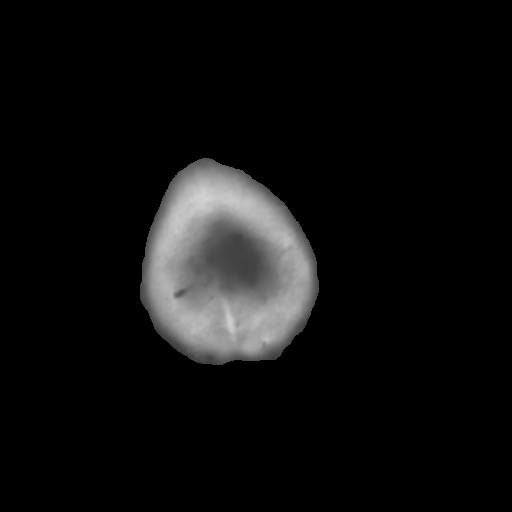

[Series 202: head w/o bone, idose (1) · axial · non-contrast · 0.45mm/px · z∈[-132,-112]mm · 2 of 31 slices shown]
[im 3/31  bone]
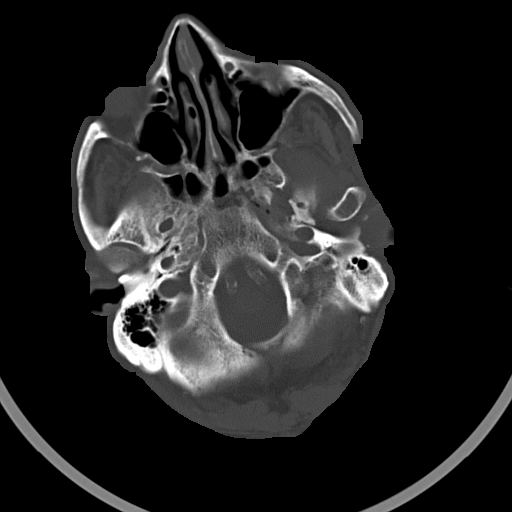
[im 7/31  bone]
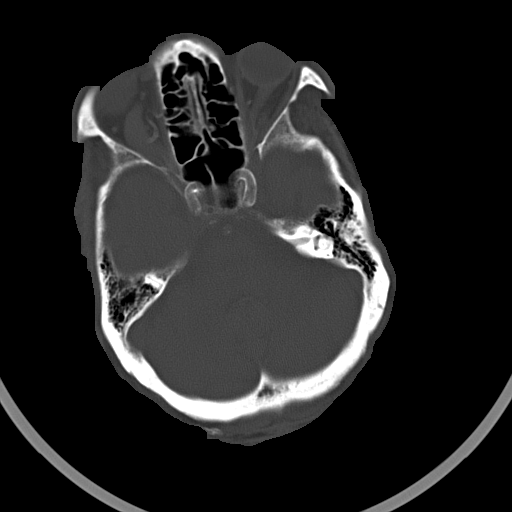

[15 of 30 positions shown; findings below may reference images not displayed]

FINDINGS: There is no evidence of acute intracranial hemorrhage, mass lesion,
brain edema or extra-axial fluid collection. The ventricles and
subarachnoid spaces are prominent but stable. There is no CT
evidence of acute cortical infarction. There is a stable old lacunar
infarct in the right thalamus and stable chronic periventricular
white matter disease. There is an old infarct in the high right
parietal lobe which appears unchanged. Diffuse intracranial vascular
calcifications are noted. The superior orbital veins appear
prominent, especially on the right. No other or orbital
abnormalities are identified. The cavernous sinus regions appear
stable.

The visualized paranasal sinuses, mastoid air cells and middle ears
are clear. The calvarium is intact.
IMPRESSION: 1. No acute intracranial findings.
2. Stable atrophy and chronic small vessel ischemic changes.
3. Prominence of the superior orbital veins of undetermined
significance/etiology.

## 2015-02-03 NOTE — Op Note (Signed)
PATIENT NAME:  Brad Mcintosh, Brad Mcintosh MR#:  [id number] DATE OF BIRTH:  [birth year]  DATE OF PROCEDURE:  12/23/2011  PREOPERATIVE DIAGNOSES:  1. End-stage renal disease.  2. Peripheral arterial disease, status post bilateral lower extremity revascularization.  3. Hypertension.   POSTOPERATIVE DIAGNOSES:  1. End-stage renal disease.  2. Peripheral arterial disease, status post bilateral lower extremity revascularization.  3. Hypertension.   PROCEDURE PERFORMED:  Left brachial artery to axillary vein arteriovenous graft.   SURGEON: Algernon Huxley, MD   ASSISTANT: Real Cons, PA   ANESTHESIA: General.   ESTIMATED BLOOD LOSS: Approximately 75 mL.   INDICATION FOR PROCEDURE: The patient is a 80 year old African American male with end-stage renal disease. He has had failed previous dialysis accesses. His forearm loop AV graft failed. He is brought back for an upper arm AV graft for a permanent dialysis access. The risks and benefits were discussed. Informed consent was obtained.   DESCRIPTION OF PROCEDURE: The patient was brought to the Operative Suite, and after an adequate level of general endotracheal was obtained, his left upper extremity was sterilely prepped and draped, and a sterile surgical field was created. Incision was created just above the antecubital fossa, and the brachial artery was dissected out. This was found to be in a very calcified vessel but was patent. It was encircled with vessel loops proximally and distally. I then made an incision in the upper arm near the axilla and dissected out the axillary vein. This was a nice patent vein. It had vessel loops placed for initial dissection to allow control later with clamps and bulldogs. I then tunneled from the antecubital incision to the axillary incision, gave 3500 units of intravenous heparin for systemic anticoagulation. A 6 mm Propaten PTFE graft was selected. This was brought through the tunnel. The artery was controlled with clamps, and  an anterior arteriotomy was created with an 11 blade and extended with Potts scissors. The graft was cut and beveled to an appropriate length to match the anterior wall arteriotomy, and anastomosis was created with a running CV-6 suture in the usual fashion. Two 6-0 Prolene patch sutures were used for hemostasis, and on flushing through the graft there was excellent pulsatile inflow. A clamp was then placed across the graft. The vein had a side branch ligated with a silk tie and then was controlled with a bulldog clamp and a vascular clamp. An anterior wall venotomy was created with an 11 blade and extended with Potts scissors, and then the graft was cut and beveled to an appropriate length to match the venotomy. CV6 suture was used to create an anastomosis in the usual fashion to the vein. The vessel was flushed and de-aired prior to release of control. On release of control, there was some needle hole bleeding that stopped after several minutes of local pressure. There was a nice flow through the graft and a 1+ radial pulse distally. The wounds were irrigated with sterile saline. Surgicel and Evicyl topical hemostatic agents were placed, and hemostasis was complete. I then closed the wounds with a running 3-0 Vicryl and 4-0 Monocryl. Dermabond was placed as a dressing. The patient tolerated the procedure well and was taken to the recovery room in stable condition.   ____________________________ Algernon Huxley, MD jsd:cbb D: 12/23/2011 09:14:57 ET T: 12/23/2011 12:56:22 ET JOB#: [id number]  cc: Algernon Huxley, MD, <Dictator> [facility] Algernon Huxley MD ELECTRONICALLY SIGNED 12/25/2011 16:26

## 2015-02-03 NOTE — Op Note (Signed)
PATIENT NAME:  Brad Mcintosh, Brad Mcintosh MR#:  [id number] DATE OF BIRTH:  October 09, 1935  DATE OF PROCEDURE:  10/26/2011  PREOPERATIVE DIAGNOSES:  1. Peripheral arterial disease with ulceration, bilateral lower extremity.  2. End-stage renal disease, in need of permanent dialysis access.  3. Hypertension  POSTOPERATIVE DIAGNOSES:  1. Peripheral arterial disease with ulceration, bilateral lower extremity.  2. End-stage renal disease, in need of permanent dialysis access.  3. Hypertension  PROCEDURE PERFORMED:   1. Ultrasound guidance for vascular access, right femoral artery.  2. Catheter placement to left anterior tibial artery from right femoral approach.  3. Aortogram and selective left lower extremity angiogram.  4. Percutaneous transluminal angioplasty of left anterior tibial artery with 3 mm diameter angioplasty balloon.   5. StarClose closure device, right femoral artery.    SURGEON: Leotis Pain, MD   ANESTHESIA: Local with moderate conscious sedation.   ESTIMATED BLOOD LOSS: 25 mL.   INDICATION FOR PROCEDURE: This is a 80 year old gentleman referred to me for dialysis access. He also had peripheral arterial disease with nonhealing ulcerations of bilateral lower extremities. He was brought in for an evaluation. He had known tibial disease previously and was brought in for attempt at revascularization. The risks and benefits were discussed. Informed consent was obtained.   DESCRIPTION OF PROCEDURE: The patient was brought to the Vascular Interventional Radiology Suite. The groins were shaved and prepped, and a sterile surgical field was created. Due to the calcified nature of his vessel, ultrasound was used to access a patent portion of the femoral artery, and this was done without difficulty under direct ultrasound guidance. A 3J-wire was placed and a 5-French sheath was placed. A pigtail catheter was placed in the aorta at the L1 level. An AP aortogram performed that showed no flow-limiting stenosis  in the aortoiliac segments. The renals did not really opacify consistent with a patient with chronic end-stage renal disease. The aortic bifurcation was then hooked, and I advanced to the left femoral head. Selective left lower extremity angiogram was then performed. This showed normal common femoral, profunda femoris, superficial femoral, and popliteal arteries. At the tibial trifurcation, the peroneal artery was actually patent to the ankle. The anterior tibial artery was diffusely diseased. I elected to treat this to improve his circulation. A 6-French Ansel sheath was placed over a Terumo Advantage Wire and exchanged for a Kumpe catheter and an 0.018 wire. I was able to gain access to the anterior tibial artery to where it basically terminated just above the ankle and take a 3 mm diameter angioplasty balloon down the majority of the length of the anterior tibial artery to just above the ankle. Balloon angioplasty was performed which resulted in improved flow through the anterior tibial artery and collateralization distally. At this point, I elected to terminate the procedure. The posterior tibial artery was chronically occluded and did not appear to reconstitute, and again the peroneal artery was patent. The sheath was pulled back to the ipsilateral external iliac artery and oblique arteriogram was performed. A StarClose closure device was deployed in the usual fashion with excellent hemostatic result. The patient tolerated the procedure well and was taken to the recovery room in stable condition.   ____________________________ Algernon Huxley, MD jsd:cbb D: 10/26/2011 16:25:59 ET T: 10/27/2011 10:33:17 ET JOB#: [id number]  cc: Algernon Huxley, MD, <Dictator> Donato Heinz, MD Algernon Huxley MD ELECTRONICALLY SIGNED 11/14/2011 15:47

## 2015-02-03 NOTE — Op Note (Signed)
PATIENT NAME:  Brad Mcintosh, Brad Mcintosh MR#:  [id number] DATE OF BIRTH:  [birth year]  DATE OF PROCEDURE:  11/09/2011  PREOPERATIVE DIAGNOSES:  1. Peripheral arterial disease with ulceration, bilateral lower extremity status post left lower extremity intervention.  2. End-stage renal disease.  3. Hypertension.  4. Status post great toe amputations bilaterally.   POSTOPERATIVE DIAGNOSES:  1. Peripheral arterial disease with ulceration, bilateral lower extremity status post left lower extremity intervention.  2. End-stage renal disease.  3. Hypertension.  4. Status post great toe amputations bilaterally.   PROCEDURES:  1. Catheter placement into right anterior tibial and right peroneal artery from left femoral approach.  2. Aortogram and selective right lower extremity angiogram.  3. Percutaneous transluminal angioplasty of peroneal artery stenosis with 3 mm diameter angioplasty balloon.  4. Percutaneous transluminal angioplasty of right anterior tibial artery with 2 and 2.5 mm diameter angioplasty balloon.  5. StarClose closure device, left femoral artery.   SURGEON: Algernon Huxley, M.D.   ANESTHESIA: Local with moderate conscious sedation.   ESTIMATED BLOOD LOSS: Approximately 25 mL.   INDICATION FOR PROCEDURE: This is a 80 year old African American male with bilateral lower extremity ulcerations. He has undergone tibial intervention on the left lower extremity  couple of weeks ago. He is brought back for intervention on his right lower extremity at this point. Risks and benefits are discussed, informed consent was obtained.   DESCRIPTION OF PROCEDURE: Patient was brought to the vascular interventional radiology suite. Groins were shaved and prepped, sterile surgical field was created. The left femoral head was localized with fluoroscopy. Left femoral artery was accessed without difficulty with a Seldinger needle. A 3-J wire and 5 French sheath were then placed. Pigtail catheter was placed in the aorta  at the L1 level. AP aortogram was performed. This showed very tortuous iliac segments with no flow-limiting stenosis in the aortoiliac segments. I then hooked the aortic bifurcation, advanced to the right femoral head and selective right lower extremity angiogram was then performed. This showed patent common femoral, profunda femoris, superficial femoral, and popliteal arteries. The tibial vessels initially the anterior tibial artery was large but then occluded but it did reconstitute distally just above the ankle. The peroneal artery was diffusely diseased proximally but was the major runoff distally. The posterior tibial artery was chronically occluded. Patient was given 3000 units of intravenous heparin for systemic anticoagulation and a 6 French Ansel sheath was placed over a Terumo advantage wire. A Kumpe catheter was used to help exchanged for an 0.018 wire which crossed the stenoses in the peroneal artery and the proximal peroneal artery which treated with a 3 mm diameter angioplasty balloon in the proximal mid segments with good angiographic completion result. I then turned my attention to the anterior tibial artery. With the help of a CXI catheter, a Terumo advantage wire was able to be advanced into the foot but the CXI catheter would not cross. We attempted to take a 3 mm diameter angioplasty balloon over the 0.035 system but this would not cross the occlusion either. At this point I downsized to an 0.018 wire and 0.018 angioplasty balloon 2.5 mm in diameter would go to the mid anterior tibial artery but would not cross the occlusion completely. I did inflate in these locations with 2.5 mm diameter angioplasty balloon from the mid to the proximal anterior tibial artery. The balloon was advanced as far distal as possible and then I removed the 0.018 wire and 0.014 was then placed which easily traversed into  the foot and with the 0.014 system I was able to get a 2 mm diameter angioplasty balloon into the  dorsalis pedis artery and then inflate up to the proximal anterior tibial artery with a 2 mm diameter angioplasty balloon. Completion angiogram now showed two-vessel runoff with the anterior tibial artery being patent to the foot and the peroneal artery having improved flow as well. At this point I elected to terminate the procedure. The sheath was pulled back to the ipsilateral external iliac artery and oblique arteriogram was performed. StarClose closure device deployed in the usual fashion with excellent hemostatic result. The patient tolerated the procedure well and was taken to the recovery room in stable condition.   ____________________________ Algernon Huxley, MD jsd:cms D: 11/09/2011 15:17:09 ET T: 11/09/2011 16:23:57 ET  JOB#: [id number] cc: Algernon Huxley, MD, <Dictator> Algernon Huxley MD ELECTRONICALLY SIGNED 11/30/2011 9:19

## 2015-02-03 NOTE — Op Note (Signed)
PATIENT NAME:  Brad Mcintosh, Brad Mcintosh MR#:  [id number] DATE OF BIRTH:  08-23-1935  DATE OF PROCEDURE:  11/18/2011  PREOPERATIVE DIAGNOSES:  1. End-stage renal disease.  2. Peripheral arterial disease with ulceration bilateral lower extremities.   POSTOPERATIVE DIAGNOSES: 1. End-stage renal disease.  2. Peripheral arterial disease with ulceration bilateral lower extremities.   PROCEDURE: Left forearm loop AV graft.   SURGEON: Algernon Huxley, MD   ANESTHESIA: General.   ESTIMATED BLOOD LOSS: 25 mL.   INDICATION FOR PROCEDURE: The patient is a 80 year old African American male with end-stage renal disease. He is currently catheter-dependent and requires permanent dialysis access. He has had failed fistulas and no adequate superficial vein was seen on our ultrasound. We recommended graft placement in the nondominant left arm. Risks and benefits were discussed. Informed consent was obtained.   DESCRIPTION OF PROCEDURE: The patient was brought to the operative suite and after an adequate level of general anesthesia was obtained, his left upper extremity was sterilely prepped and draped and a sterile surgical field was created. A transverse incision was created at the antecubital fossa. The brachial artery was dissected out and found to be calcified but large and patent. A nice brachial vein was seen in this location just lateral to the artery and so I elected to perform a loop forearm AV graft. A 16 mm Propatent graft was selected. This was brought onto the field and tunneled with help of a counterincision in the distal forearm. The arterial side was medial and the venous side was lateral. The patient was given 3000 units of intravenous heparin for systemic anticoagulation and control was pulled up on the vessel loops and actually a vascular clamp had to be placed proximally due to the size and calcific nature of the vessel. An anterior wall arteriotomy was created with an 11 blade and extended with Potts scissors  and the graft was cut and beveled to an appropriate length to match this arteriotomy. Anastomosis was created with a running CV-6 suture in the usual fashion and we flushed through the graft. The graft was pulled taut. The vein was controlled with bulldog clamps and an anterior wall venotomy was created with an 11 blade and extended with Potts scissors. The graft was then cut and beveled to an appropriate length to match the venotomy and anastomosis was created with a running CV-6 suture. A single 6-0 Prolene patch suture was used for hemostasis and the vessel was flushed and de-aired prior to release of control. On release there was a nice thrill within the brachial vein above the graft and a nice pulse within the graft. The wound was irrigated. Surgicel and Evicyl topical hemostatic agents were placed. Hemostasis was complete. The wounds were then closed with 3-0 Vicryl and 4-0 Monocryl. Dermabond was placed as a dressing. The patient tolerated the procedure well and was taken to the recovery room in stable condition.   ____________________________ Algernon Huxley, MD jsd:drc D: 11/18/2011 13:56:06 ET T: 11/18/2011 14:14:17 ET JOB#: [id number]  cc: Algernon Huxley, MD, <Dictator> Algernon Huxley MD ELECTRONICALLY SIGNED 12/04/2011 13:25

## 2015-02-12 IMAGING — CR DG ABDOMEN 1V
1 series · 1 of 1 positions shown · non-contrast
Comparison: None.

CLINICAL DATA: NG tube placement.

EXAM:
ABDOMEN - 1 VIEW

[AP]
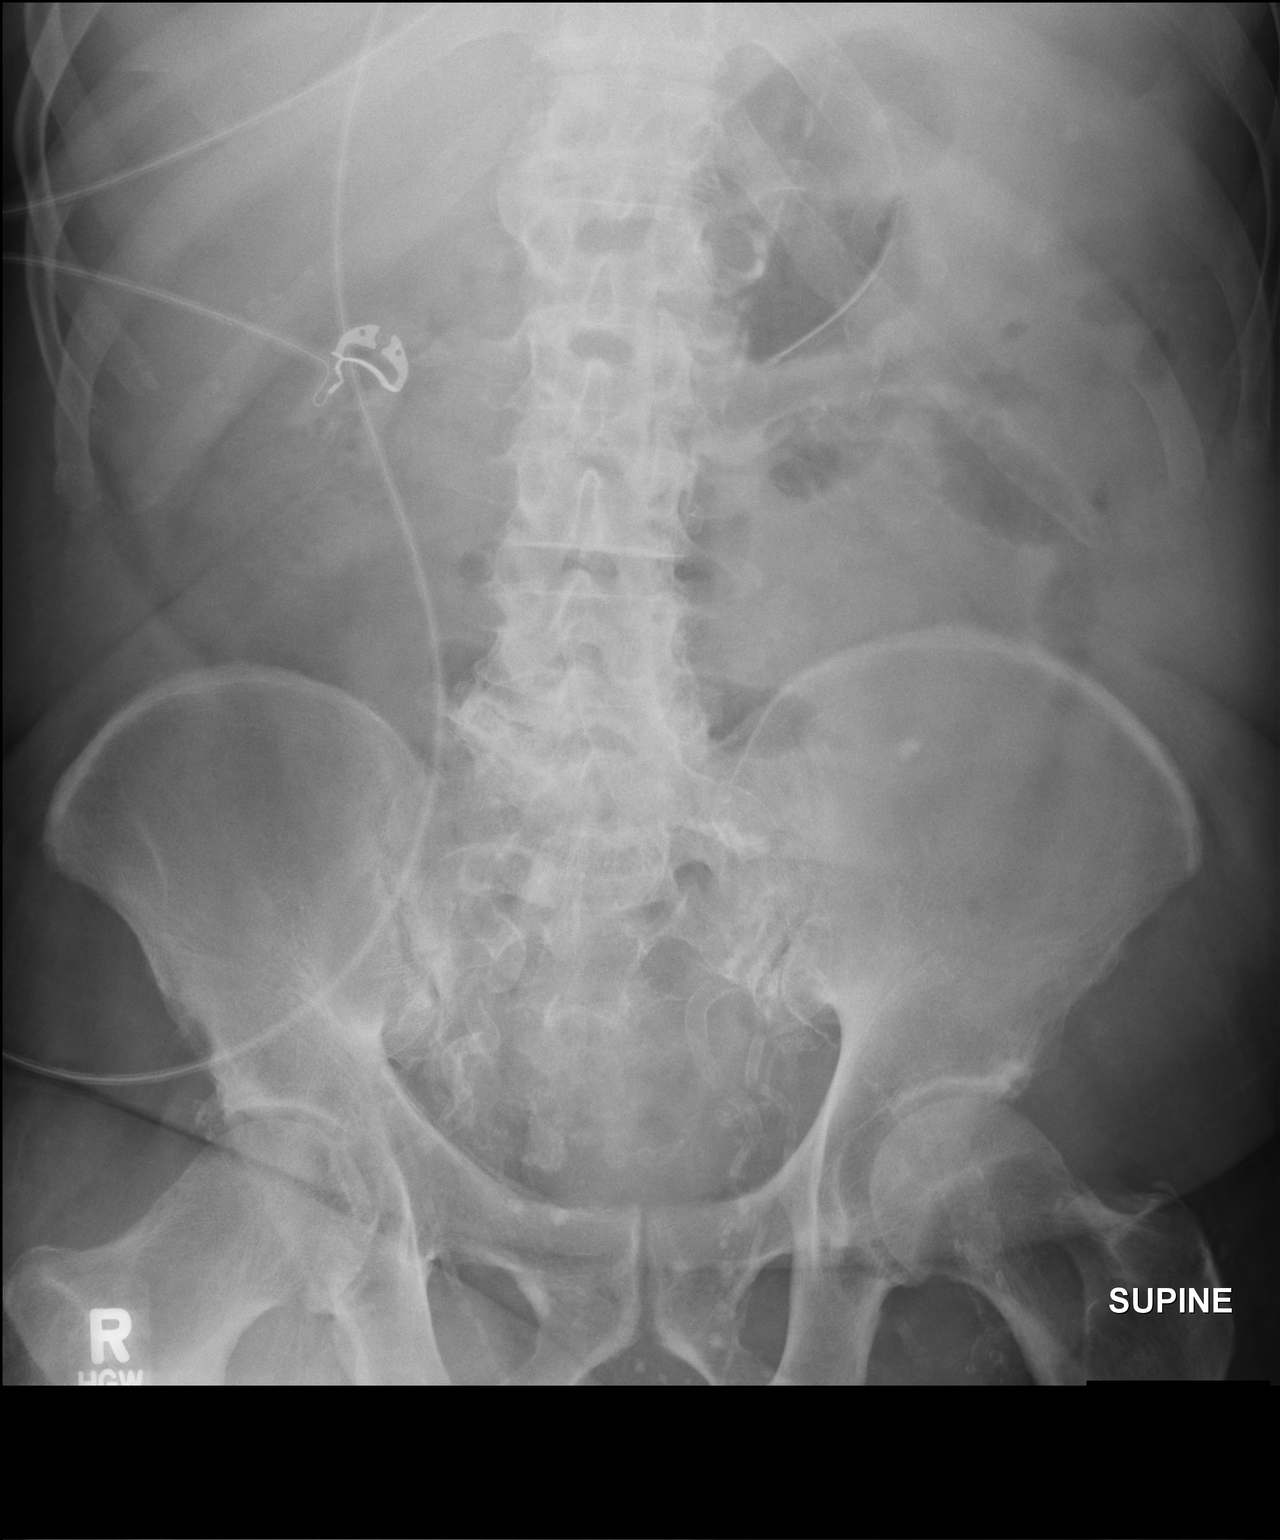

[1 of 1 positions shown; findings below may reference images not displayed]

FINDINGS: NG tube is identified with the tip in the midbody of the stomach.
IMPRESSION: As above.

## 2015-02-12 IMAGING — CR DG CHEST 1V PORT SAME DAY
1 series · 1 of 1 positions shown · non-contrast
Comparison: 09/18/2014

CLINICAL DATA: Short of breath.  Dialysis patient

EXAM:
PORTABLE CHEST - 1 VIEW SAME DAY

[AP]
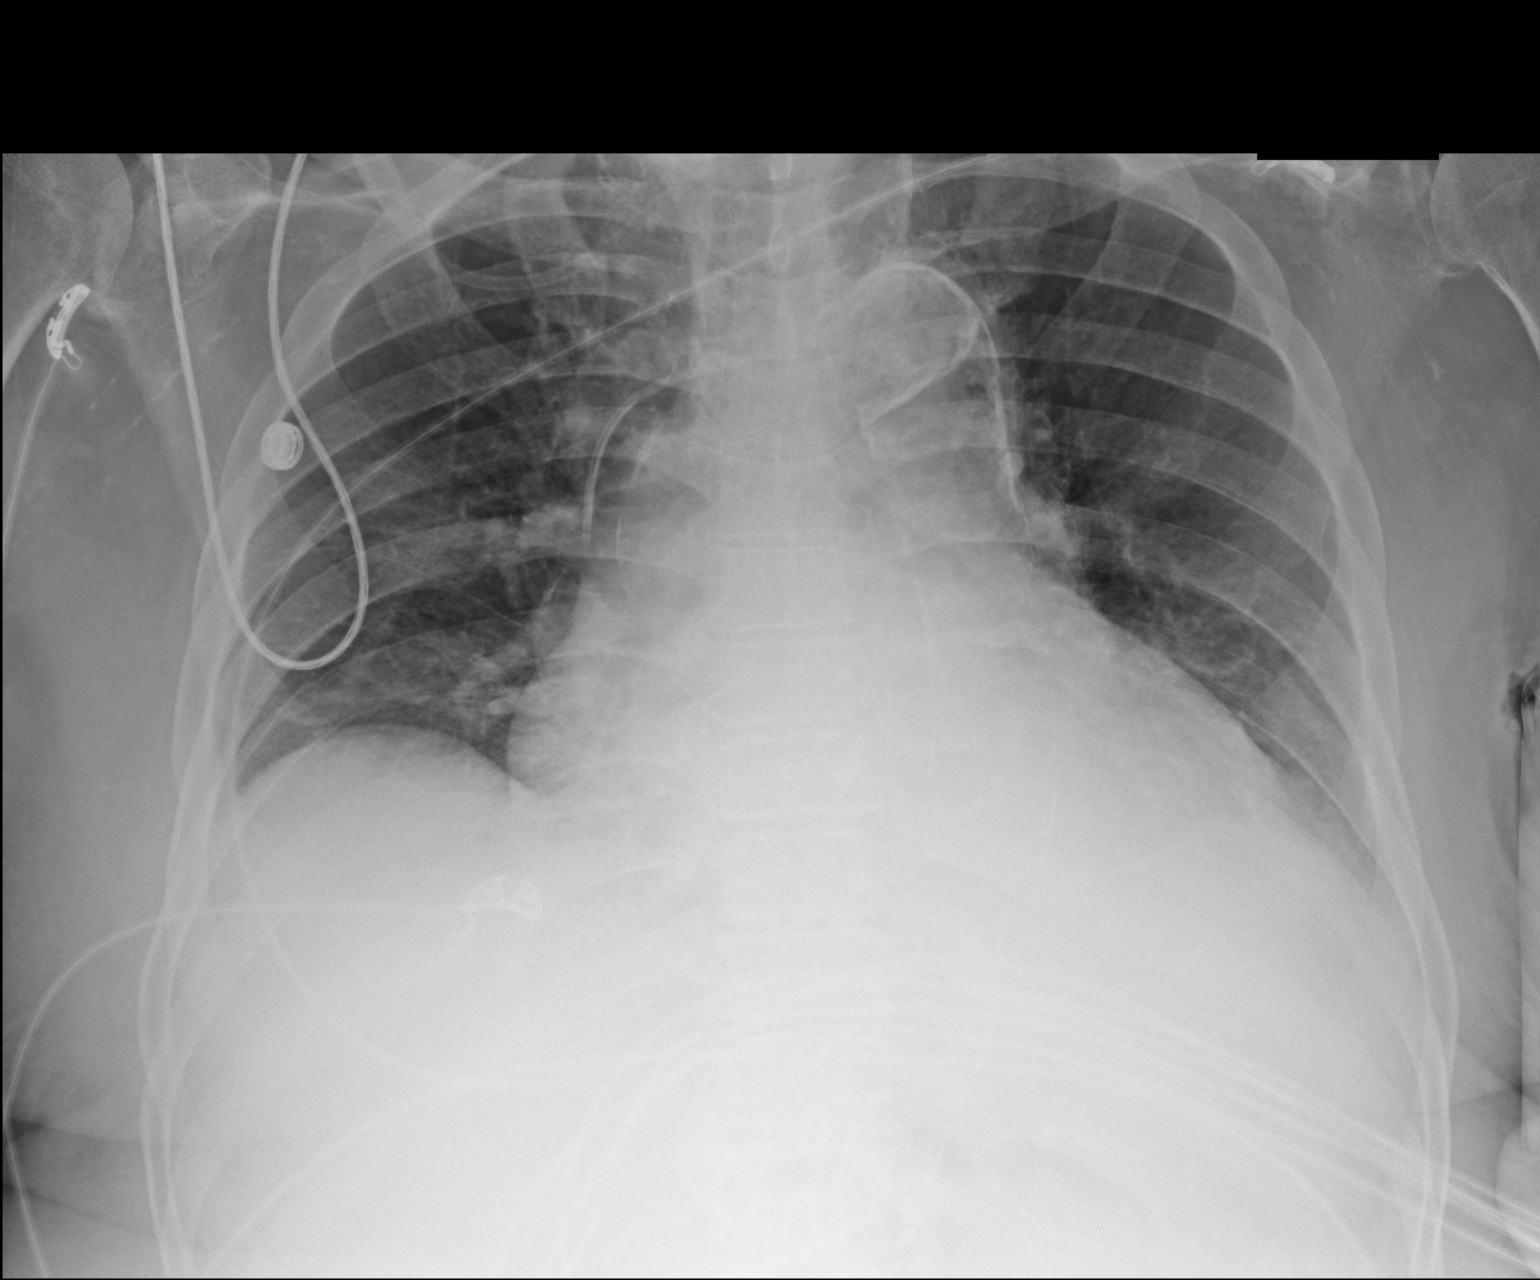

[1 of 1 positions shown; findings below may reference images not displayed]

FINDINGS: Cardiac enlargement.  Negative for heart failure or edema.

Mild bibasilar atelectasis.  Negative for pleural effusion.

Left jugular central venous catheter tip in the SVC is unchanged.
IMPRESSION: Mild bibasilar atelectasis has progressed since the prior study.
Negative for pulmonary edema.
